# Patient Record
Sex: Male | Born: 1960 | Race: White | Hispanic: No | Marital: Single | Smoking: Current every day smoker
Health system: Southern US, Community
[De-identification: ages and names within clinical notes are randomized; demographics above are authoritative.]

## PROBLEM LIST (undated history)

## (undated) DIAGNOSIS — I2119 ST elevation (STEMI) myocardial infarction involving other coronary artery of inferior wall: Secondary | ICD-10-CM

## (undated) DIAGNOSIS — I69398 Other sequelae of cerebral infarction: Secondary | ICD-10-CM

## (undated) DIAGNOSIS — N400 Enlarged prostate without lower urinary tract symptoms: Secondary | ICD-10-CM

## (undated) DIAGNOSIS — E785 Hyperlipidemia, unspecified: Secondary | ICD-10-CM

## (undated) DIAGNOSIS — E1165 Type 2 diabetes mellitus with hyperglycemia: Secondary | ICD-10-CM

## (undated) DIAGNOSIS — C61 Malignant neoplasm of prostate: Secondary | ICD-10-CM

## (undated) DIAGNOSIS — N200 Calculus of kidney: Secondary | ICD-10-CM

## (undated) DIAGNOSIS — D649 Anemia, unspecified: Secondary | ICD-10-CM

## (undated) DIAGNOSIS — E118 Type 2 diabetes mellitus with unspecified complications: Secondary | ICD-10-CM

## (undated) DIAGNOSIS — N312 Flaccid neuropathic bladder, not elsewhere classified: Secondary | ICD-10-CM

## (undated) DIAGNOSIS — I2510 Atherosclerotic heart disease of native coronary artery without angina pectoris: Secondary | ICD-10-CM

## (undated) DIAGNOSIS — E669 Obesity, unspecified: Secondary | ICD-10-CM

## (undated) DIAGNOSIS — N319 Neuromuscular dysfunction of bladder, unspecified: Secondary | ICD-10-CM

## (undated) DIAGNOSIS — I10 Essential (primary) hypertension: Secondary | ICD-10-CM

## (undated) DIAGNOSIS — IMO0002 Reserved for concepts with insufficient information to code with codable children: Secondary | ICD-10-CM

## (undated) DIAGNOSIS — I69998 Other sequelae following unspecified cerebrovascular disease: Secondary | ICD-10-CM

## (undated) HISTORY — DX: Reserved for concepts with insufficient information to code with codable children: IMO0002

## (undated) HISTORY — DX: Flaccid neuropathic bladder, not elsewhere classified: N31.2

## (undated) HISTORY — DX: Hyperlipidemia, unspecified: E78.5

## (undated) HISTORY — PX: BACK SURGERY: SHX140

## (undated) HISTORY — DX: Atherosclerotic heart disease of native coronary artery without angina pectoris: I25.10

## (undated) HISTORY — DX: Benign prostatic hyperplasia without lower urinary tract symptoms: N40.0

## (undated) HISTORY — PX: CORONARY STENT PLACEMENT: SHX1402

## (undated) HISTORY — DX: ST elevation (STEMI) myocardial infarction involving other coronary artery of inferior wall: I21.19

## (undated) HISTORY — DX: Obesity, unspecified: E66.9

## (undated) HISTORY — DX: Type 2 diabetes mellitus with hyperglycemia: E11.65

## (undated) HISTORY — PX: OTHER SURGICAL HISTORY: SHX169

## (undated) HISTORY — DX: Type 2 diabetes mellitus with unspecified complications: E11.8

---

## 1997-06-10 ENCOUNTER — Ambulatory Visit (HOSPITAL_COMMUNITY): Admission: RE | Admit: 1997-06-10 | Discharge: 1997-06-10 | Payer: Self-pay | Admitting: Urology

## 1997-06-12 ENCOUNTER — Ambulatory Visit (HOSPITAL_COMMUNITY): Admission: RE | Admit: 1997-06-12 | Discharge: 1997-06-12 | Payer: Self-pay | Admitting: Urology

## 2002-08-01 ENCOUNTER — Emergency Department (HOSPITAL_COMMUNITY): Admission: EM | Admit: 2002-08-01 | Discharge: 2002-08-01 | Payer: Self-pay | Admitting: Emergency Medicine

## 2004-02-12 ENCOUNTER — Emergency Department (HOSPITAL_COMMUNITY): Admission: EM | Admit: 2004-02-12 | Discharge: 2004-02-12 | Payer: Self-pay | Admitting: Emergency Medicine

## 2009-10-12 ENCOUNTER — Inpatient Hospital Stay (HOSPITAL_COMMUNITY): Admission: AD | Admit: 2009-10-12 | Discharge: 2009-10-15 | Payer: Self-pay | Admitting: Cardiovascular Disease

## 2009-10-12 ENCOUNTER — Ambulatory Visit: Payer: Self-pay | Admitting: Cardiovascular Disease

## 2009-10-12 ENCOUNTER — Ambulatory Visit: Payer: Self-pay | Admitting: Internal Medicine

## 2009-10-14 ENCOUNTER — Encounter: Payer: Self-pay | Admitting: Cardiovascular Disease

## 2009-10-22 DIAGNOSIS — E785 Hyperlipidemia, unspecified: Secondary | ICD-10-CM

## 2009-10-22 DIAGNOSIS — E669 Obesity, unspecified: Secondary | ICD-10-CM

## 2009-10-22 DIAGNOSIS — I2119 ST elevation (STEMI) myocardial infarction involving other coronary artery of inferior wall: Secondary | ICD-10-CM

## 2009-10-22 DIAGNOSIS — F17200 Nicotine dependence, unspecified, uncomplicated: Secondary | ICD-10-CM | POA: Insufficient documentation

## 2009-10-22 HISTORY — DX: ST elevation (STEMI) myocardial infarction involving other coronary artery of inferior wall: I21.19

## 2009-10-22 HISTORY — DX: Obesity, unspecified: E66.9

## 2009-10-29 ENCOUNTER — Ambulatory Visit: Payer: Self-pay | Admitting: Cardiovascular Disease

## 2009-10-29 DIAGNOSIS — I2510 Atherosclerotic heart disease of native coronary artery without angina pectoris: Secondary | ICD-10-CM | POA: Insufficient documentation

## 2009-10-29 HISTORY — DX: Atherosclerotic heart disease of native coronary artery without angina pectoris: I25.10

## 2009-11-09 ENCOUNTER — Ambulatory Visit: Payer: Self-pay | Admitting: Cardiovascular Disease

## 2009-11-12 LAB — CONVERTED CEMR LAB
Albumin: 3.8 g/dL (ref 3.5–5.2)
Alkaline Phosphatase: 110 units/L (ref 39–117)
Cholesterol: 139 mg/dL (ref 0–200)
Direct LDL: 87.7 mg/dL
Total Protein: 6.8 g/dL (ref 6.0–8.3)

## 2009-12-11 ENCOUNTER — Ambulatory Visit: Payer: Self-pay | Admitting: Cardiovascular Disease

## 2009-12-18 ENCOUNTER — Telehealth: Payer: Self-pay | Admitting: Cardiovascular Disease

## 2009-12-21 LAB — CONVERTED CEMR LAB
ALT: 68 units/L — ABNORMAL HIGH (ref 0–53)
AST: 53 units/L — ABNORMAL HIGH (ref 0–37)
Alkaline Phosphatase: 137 units/L — ABNORMAL HIGH (ref 39–117)
Total Bilirubin: 0.4 mg/dL (ref 0.3–1.2)

## 2010-01-12 ENCOUNTER — Ambulatory Visit: Payer: Self-pay | Admitting: Cardiovascular Disease

## 2010-01-13 LAB — CONVERTED CEMR LAB
AST: 57 units/L — ABNORMAL HIGH (ref 0–37)
Albumin: 3.8 g/dL (ref 3.5–5.2)
Total Protein: 7.2 g/dL (ref 6.0–8.3)

## 2010-02-03 ENCOUNTER — Telehealth (INDEPENDENT_AMBULATORY_CARE_PROVIDER_SITE_OTHER): Payer: Self-pay | Admitting: *Deleted

## 2010-02-08 ENCOUNTER — Ambulatory Visit: Payer: Self-pay

## 2010-02-11 LAB — CONVERTED CEMR LAB
AST: 41 units/L — ABNORMAL HIGH (ref 0–37)
Albumin: 3.7 g/dL (ref 3.5–5.2)
Alkaline Phosphatase: 120 units/L — ABNORMAL HIGH (ref 39–117)
Bilirubin, Direct: 0.1 mg/dL (ref 0.0–0.3)
Total Protein: 7.3 g/dL (ref 6.0–8.3)

## 2010-03-01 ENCOUNTER — Telehealth (INDEPENDENT_AMBULATORY_CARE_PROVIDER_SITE_OTHER): Payer: Self-pay | Admitting: *Deleted

## 2010-03-14 ENCOUNTER — Encounter: Payer: Self-pay | Admitting: Emergency Medicine

## 2010-03-23 NOTE — Progress Notes (Signed)
Summary: question on meds  Phone Note Call from Patient Call back at Home Phone [phone] Call back at [phone]   Caller: Patient Reason for Call: Talk to Nurse Summary of Call: pt has question on rx EFFIENT 10 MG TABS.  # [phone] Initial call taken by: Roe Coombs,  December 18, 2009 10:34 AM  Follow-up for Phone Call        pt sister patricia states they fill out the form for the medication wrong. Now they need it to be fax again new rx prescription pt also needs a letter stating he does not have medicare. pt sister would like to talk to someone. # [phone]. Follow-up by: Roe Coombs,  December 18, 2009 10:56 AM  Additional Follow-up for Phone Call Additional follow up Details #1::        I talked with sister, Elease Hashimoto, about [facility] patient assistance form.  They accidentally checked the box yes --- as being eligible for medicare.  According to pt sister he is not eligible for medicare or medicaid and would like to speak with Dr. Clifton James.  I told her he is out of the office until Monday.  She will call back then and in the meantime will pick up another form from the office today.  I told her we could fax that form in for her. Mylo Red RN     Appended Document: question on meds Can we check on this and see what we can do to help? thanks, cdm  Appended Document: question on meds I faxed the info. to [employer]. Pt is aware.

## 2010-03-23 NOTE — Assessment & Plan Note (Signed)
Summary: eph/jml   Visit Type:  new pt visit Primary Provider:  NO PCP  CC:  no cardiac complaints today.  History of Present Illness: 50 yo WM with history of hyperlipidemia, CAD with recent admission to [facility] October 13, 2009 with acute inferior wall infarction with occluded RCA, now s/p bare metal stent RCA. His LVEF was 45% on LV gram. He has been doing well. No chest pain or SOB. He continues to smoke. No exercise.   Current Medications (verified): 1)  Aspirin Ec 325 Mg Tbec (Aspirin) .... Take One Tablet By Mouth Daily 2)  Lisinopril 2.5 Mg Tabs (Lisinopril) .[name].. 1 Tab Once Daily 3)  Metoprolol Tartrate 25 Mg Tabs (Metoprolol Tartrate) .... 1/2 Tab Two Times A Day 4)  Pravachol 40 Mg Tabs (Pravastatin Sodium) .[name].. 1 Tab Once Daily 5)  Effient 10 Mg Tabs (Prasugrel Hcl) .[name].. 1 Tab Once Daily  Allergies (verified): No Known Drug Allergies  Past History:  Past Medical History: CAD with inferior wall MI August 2011 with bare metal stent RCA.Diffuse disease in LM, LAD, RCA OBESITY  DYSLIPIDEMIA  TOBACCO ABUSE     Past Surgical History: Reviewed history from 10/22/2009 and no changes required. Back Surgery Testicular surgery  Family History: Denies any premature history of coronary artery   disease, sudden cardiac death to his knowledge.  Mother alive and healthy Father deceased, DM Uncle CAD/MI 1 sister alive and healthy  Social History: Single, no kids Tobacco Use - Yes, 1ppd for 30 years Regular Exercise - no Drug Use - no No alcohol Unemployed.  Review of Systems  The patient denies fatigue, malaise, fever, weight gain/loss, vision loss, decreased hearing, hoarseness, chest pain, palpitations, shortness of breath, prolonged cough, wheezing, sleep apnea, coughing up blood, abdominal pain, blood in stool, nausea, vomiting, diarrhea, heartburn, incontinence, blood in urine, muscle weakness, joint pain, leg swelling, rash, skin lesions, headache,  fainting, dizziness, depression, anxiety, enlarged lymph nodes, easy bruising or bleeding, and environmental allergies.    Vital Signs:  Patient profile:   50 year old male Height:      73 inches Weight:      276.8 pounds BMI:     36.65 Pulse rate:   63 / minute Pulse rhythm:   irregular BP sitting:   128 / 80  (left arm) Cuff size:   large  Vitals Entered By: Danielle Rankin, CMA (October 29, 2009 9:39 AM)  Physical Exam  General:  General: Well developed, well nourished, NAD HEENT: OP clear, mucus membranes moist SKIN: warm, dry Neuro: No focal deficits Musculoskeletal: Muscle strength 5/5 all ext Psychiatric: Mood and affect normal Neck: No JVD, no carotid bruits, no thyromegaly, no lymphadenopathy. Lungs:Clear bilaterally, no wheezes, rhonci, crackles CV: RRR no murmurs, gallops rubs Abdomen: soft, NT, ND, BS present Extremities: No edema, pulses 2+.    Cardiac Cath  Procedure date:  10/13/2009  Findings:      1. The left main coronary artery had a 40-50% stenosis.   2. The left anterior descending was a large-caliber vessel that       coursed to the apex and gave off several diagonal branches.  First       diagonal branch was a small vessel with ostial 60% stenosis.  The       second diagonal was a large vessel with plaque disease.  Proximal       LAD has 40% plaque.  The mid vessel had 40% plaque.  The distal  vessel had mild plaque.   3. The circumflex artery was a large-caliber vessel that gave off a       very small-caliber first obtuse marginal branch and a large-caliber       second obtuse marginal branch.  There were no flow-limiting lesions       in this vessel.   4. The right coronary artery was a large dominant system with diffuse       40% stenosis throughout the proximal and midportion of the vessel.       There was a 100% occlusion in the mid vessel just prior to the       distal segment.  After the vessel was opened, it was apparent that        the right coronary artery was very large with a large-caliber       posterior descending artery.   5. Left ventricular angiogram showed inferior wall hypokinesis with       ejection fraction of 45%.      IMPRESSION:   1. Acute inferior ST elevation myocardial infarction.2. Total occlusion of the mid right coronary artery.   3. Moderate disease in the left main artery, left anterior descending       artery and proximal right coronary artery.   4. Mild segmental left ventricular systolic dysfunction.   5. Successful percutaneous coronary intervention with placement of       bare-metal stent in the mid-to-distal right coronary artery.   EKG  Procedure date:  10/29/2009  Findings:      NSR, rate 63 bpm.   Echocardiogram  Procedure date:  10/14/2009  Findings:      Left ventricle: The cavity size was normal. Wall thickness was     normal. Systolic function was mildly reduced. The estimated     ejection fraction was in the range of 45% to 50%. The basal     inferior wall was severely hypokinetic to akinetic, the mid     inferior wall was hypokinetic. The posterior wall was probably     mildly hypokinetic. Features are consistent with a pseudonormal     left ventricular filling pattern, with concomitant abnormal     relaxation and increased filling pressure (grade 2 diastolic     dysfunction).   - Aortic valve: There was no stenosis.   - Aorta: Mild dilation of aortic root.   - Mitral valve: Trivial regurgitation.   - Right ventricle: The cavity size was normal. Systolic function was     normal.   - Tricuspid valve: Peak RV-RA gradient: 27mm Hg (S).   - Systemic veins: The IVC was not visualized.  Impression & Recommendations:  Problem # 1:  CAD, NATIVE VESSEL (ICD-414.01) s/p inferior wall STEMI Aguust 23, 2011 with bare metal stent RCA. He has diffuse disease in his left main, LAD and RCA. He continue to smoke, is overweight and not physically active. He has been taking his  medications. He has applied for the assistance program for Effient but has not heard back yet. He got one month of Effient for free. I have given him two months of samples of Effient today. He is getting his other medications. I have asked him to stop smoking, begin exercising and eating better.   His updated medication list for this problem includes:    Aspirin Ec 325 Mg Tbec (Aspirin) .[name]... Take one tablet by mouth daily    Lisinopril 2.5 Mg Tabs (Lisinopril) .[name]... 1 tab once daily  Metoprolol Tartrate 25 Mg Tabs (Metoprolol tartrate) .[name]... 1/2 tab two times a day    Effient 10 Mg Tabs (Prasugrel hcl) .[name]... 1 tab once daily  Problem # 2:  AMI, INFERIOR WALL (ICD-410.40) See above.   His updated medication list for this problem includes:    Aspirin Ec 325 Mg Tbec (Aspirin) .[name]... Take one tablet by mouth daily    Lisinopril 2.5 Mg Tabs (Lisinopril) .[name]... 1 tab once daily    Metoprolol Tartrate 25 Mg Tabs (Metoprolol tartrate) .[name]... 1/2 tab two times a day    Effient 10 Mg Tabs (Prasugrel hcl) .[name]... 1 tab once daily  Problem # 3:  DYSLIPIDEMIA (ICD-272.4) Continue Pravachol. Repeat LFTs, Fasting lipids in 10 weeks.   His updated medication list for this problem includes:    Pravachol 40 Mg Tabs (Pravastatin sodium) .[name]... 1 tab once daily  Problem # 4:  TOBACCO ABUSE (ICD-305.1) smoking cessation encouraged.   Other Orders: EKG w/ Interpretation (93000)  Patient Instructions: 1)  Your physician recommends that you schedule a follow-up appointment in: 6 months 2)  Your physician recommends that you return for a FASTING lipid profile and liver function test in 10 weeks. 272.0/414.01/wpa 3)  Your physician recommends that you continue on your current medications as directed. Please refer to the Current Medication list given to you today.

## 2010-03-25 NOTE — Progress Notes (Signed)
  DDS Request received sent to [facility]. Cala Bradford Mesiemore  March 01, 2010 2:35 PM

## 2010-03-25 NOTE — Progress Notes (Signed)
  DDS request received sent to [facility]  February 03, 2010 1:52 PM

## 2010-04-14 NOTE — H&P (Signed)
NAME:  Shane Bond, Shane Bond NO.:  [id number]  MEDICAL RECORD NO.:  [id number]          PATIENT TYPE:  INP  LOCATION:  2910                         FACILITY:  [facility]  PHYSICIAN:  Lidia Collum, MD      DATE OF BIRTH:  [birth year]  DATE OF ADMISSION:  10/12/2009 DATE OF DISCHARGE:                             HISTORY & PHYSICAL  CHIEF COMPLAINT:  Chest, bilateral arm pain and ST elevation MI.  HISTORY OF PRESENT ILLNESS:  A 50 year old gentleman with cardiac risk factors of extensive tobacco use, no other history of known coronary artery disease who developed bilateral arm and back pain at approximately 9:00 p.m.  He noted the symptoms first upon getting up and adjusting his pillow case, getting ready to go to bed.  The symptoms were associated with nausea, vomiting, diaphoresis.  These symptoms were unfamiliar to him and acute in onset.  He did not have any clear chest pain or chest discomfort but described it mostly as significant pain in his bilateral arms as well as in his back associated with some shortness of breath as well.  He called his sister who brought him to the emergency room at [facility] and at that time he was noted to have inferior ST elevations and was complaining of the above-mentioned symptoms.  He was treated with sublingual nitroglycerin and the STEMI protocol was activated.  He was emergently transferred to [facility] where he underwent urgent angiography.  His vital signs remained stable and upon presentation here, he felt that his symptoms had improved to 1/10.  He was transferred on heparin and treated with aspirin.  Angiography demonstrated total occlusion of the mid RCA requiring primary PCI with bare mental stent placement.  He was also noted to have other moderate lesions of the left system.  His vital signs remained stable throughout and he tolerated the procedure well, receiving Angiomax during the case.  Ventriculogram showed  his LV function was overall preserved.  He was transferred to the CCU and at present feels much better than he did prior without any complaints of arm or back pain at present or chest discomfort.  PAST MEDICAL HISTORY:  None.  Denies any hypertension, coronary artery disease, diabetes or dyslipidemia.  He has never had a cardiac workup including catheterization or stent prior to this episode.  PAST SURGICAL HISTORY:  Significant for back surgery as well as testicular surgery.  MEDICATIONS:  On arrival were none.  At home, he is currently on Angiomax as per protocol.  He received a full dose aspirin 325 p.o. daily and Effient 60 mg load in the cath lab.  He does not have any known drug allergies.  He currently lives with his wife.  He is unemployed at present.  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  He has significant tobacco history of approximately 25-30 years.  He notes occasional alcohol, notes regular diet and does not note any heavy activity at baseline.  He states that though he is ambulatory, he is generally more sedentary than he would like.  FAMILY HISTORY:  Denies any premature history of  coronary artery disease, sudden cardiac death to his knowledge.  REVIEW OF SYSTEMS:  Negative for fevers, chills, sweats, negative for headache, nasal discharge.  Positive for chest pain, shortness of breath, dyspnea, denies any PND.  Denies orthopnea.  Denies any change in lower extremity edema.  Denies any syncope, presyncope or any palpitations pain.  Denies any change in his bowel habits or change in urinary frequency.  He did have some nausea without any vomiting, no diarrhea.  No bright red blood per rectum or melena.  Does have history and complaints of heartburn.  Denies any polyuria or polydipsia or heat or cold intolerance.  PHYSICAL EXAMINATION:  VITAL SIGNS:  Currently blood pressure 169/70, afebrile, 99% on 2 liters, pulse of 65, 118 kg is his weight. GENERAL:  The  patient looks generally well in no acute distress, lying flat in the hospital bed in CCU room floor. HEENT:  Normocephalic, atraumatic.  Extraocular movements are intact. NECK:  Supple.  Jugular veins are flat. CARDIOVASCULAR:  Normal S1 and S2.  There is a soft 2/6 ejection murmur with no rubs noted.  PMI is not displaced.  Pulses are 2+ bilaterally. LUNGS:  Anteriorly are clear. SKIN:  Shows no rashes. ABDOMEN:  Soft, nontender but obese.  There is no rebounding, no guarding.  His right groin site of vascular access is soft, clear, no hematoma.  Angio-Seal was placed. RECTAL:  Deferred. EXTREMITIES:  Showed no edema at present. MUSCULOSKELETAL:  Within normal limits. NEUROLOGIC:  He is intact with no focal deficits noted.  SUBJECTIVE LAB WORK:  Underwent cardiac cath urgently here which showed left main 40% stenosis, LAD 40% in the mid area, diagonal ostial 60% but is a small vessel.  D2 large vessel with plaque distally.  Circumflex shows an OM1 which is very small, OM2 which is a large caliber.  RCA proximal and mid, there is a 40% diffuse lesion.  There was a 100% mid occlusion that was treated with a 3.0 x 12 mm Integrity BMS with post dilation.  LVEF noted to be 45% with inferior wall hypokinesis.  Lab work on presentation, H&H is 15/44, BUN and creatinine 8 and 0.7, potassium 4.7.  all other labs including cardiac enzymes are all pending.  Chest x-ray was also not done and is pending.  ECG on admission noted to have a heart rate sinus rhythm, normal intervals with significant ST elevations inferiorly with some reciprocal ST depressions.  ECG postprocedure shows normal sinus rhythm, heart rate of 73.  There is an isolated Q-wave in lead III,  PR interval is 176, ST elevations inferiorly to have resolved and otherwise now unremarkable ECG.  ASSESSMENT/PLAN:  This is a 50 year old gentleman with no known coronary disease, cardiac risk factor of significant tobacco use who  presents now with acute onset arm and chest discomfort consistent with an acute occlusion of the mid RCA, underwent primary PCA with successful bare metal stent placement and good post procedure TIMI flow with mildly reduced LV function overall, now admitted for further management. 1. Inferior wall myocardial infarction status post primary PCA bare-     metal stent to the right coronary artery.  Ejection fraction     overall preserved with inferior hypokinesis, but overall about 45%.     Current management will consist of full dose aspirin and Effient 10     mg p.o. daily, maintained dual antiplatelet therapy at present.  We     will continue the Angiomax drip for now until the  bag runs out.  We     will obtain an echocardiogram in the a.m. to better evaluate LV     function.  I would also check postprocedure cardiac enzymes for     troponin peak and also check fasting lipids in the a.m. We will     start low-dose beta blockade with Lopressor 12.5 mg p.o. b.i.d.     with hold parameters and start Crestor 40 mg p.o. at bedtime.  We     will hold on ACE inhibitor at present but will consider once echo     is resulted and we will also follow up baseline labs including     electrolytes, CBC and chemistry and cardiac enzymes for which I     will repeat ECG in a.m. and check a chest x-ray as well.     Regarding his moderate left main left anterior descending and     proximal right coronary artery disease, we will continue current     medical management at present and the patient will be followed     conservatively for now.  Further recommendations on that to follow.     Lidia Collum, MD    RM/MEDQ  D:  10/13/2009  T:  10/13/2009  Job:  [id number]  Electronically Signed by Arvilla Meres MD on 04/14/2010 01:51:07 PM

## 2010-05-06 LAB — POCT I-STAT, CHEM 8
BUN: 8 mg/dL (ref 6–23)
Calcium, Ion: 1.19 mmol/L (ref 1.12–1.32)
Chloride: 101 mEq/L (ref 96–112)
Creatinine, Ser: 1 mg/dL (ref 0.4–1.5)
HCT: 49 % (ref 39.0–52.0)
Hemoglobin: 16.7 g/dL (ref 13.0–17.0)
TCO2: 28 mmol/L (ref 0–100)

## 2010-05-06 LAB — COMPREHENSIVE METABOLIC PANEL
Albumin: 4 g/dL (ref 3.5–5.2)
BUN: 9 mg/dL (ref 6–23)
CO2: 29 mEq/L (ref 19–32)
Calcium: 9.6 mg/dL (ref 8.4–10.5)
Creatinine, Ser: 0.97 mg/dL (ref 0.4–1.5)
GFR calc non Af Amer: >60 mL/min (ref >60)
Glucose, Bld: 179 mg/dL — ABNORMAL HIGH (ref 70–99)
Potassium: 3.4 mEq/L — ABNORMAL LOW (ref 3.5–5.1)
Sodium: 140 mEq/L (ref 135–145)
Total Protein: 8 g/dL (ref 6.0–8.3)

## 2010-05-06 LAB — PROTIME-INR: INR: 1.06 (ref 0.00–1.49)

## 2010-05-06 LAB — CBC
HCT: 43.8 % (ref 39.0–52.0)
RDW: 12.1 % (ref 11.5–15.5)
WBC: 13.4 10*3/uL — ABNORMAL HIGH (ref 4.0–10.5)

## 2010-05-06 LAB — POCT CARDIAC MARKERS: CKMB, poc: <1 ng/mL — ABNORMAL LOW (ref 1.0–8.0)

## 2010-05-07 LAB — CBC
HCT: 41.4 % (ref 39.0–52.0)
Hemoglobin: 14.4 g/dL (ref 13.0–17.0)
MCH: 33.3 pg (ref 26.0–34.0)
MCH: 33.6 pg (ref 26.0–34.0)
MCV: 95.7 fL (ref 78.0–100.0)
Platelets: 188 10*3/uL (ref 150–400)
RBC: 4.17 MIL/uL — ABNORMAL LOW (ref 4.22–5.81)
RBC: 4.28 MIL/uL (ref 4.22–5.81)
RDW: 12.8 % (ref 11.5–15.5)
WBC: 8.9 10*3/uL (ref 4.0–10.5)

## 2010-05-07 LAB — COMPREHENSIVE METABOLIC PANEL
AST: 56 U/L — ABNORMAL HIGH (ref 0–37)
Alkaline Phosphatase: 107 U/L (ref 39–117)
CO2: 25 mEq/L (ref 19–32)
Chloride: 106 mEq/L (ref 96–112)
Creatinine, Ser: 0.78 mg/dL (ref 0.4–1.5)
GFR calc non Af Amer: >60 mL/min (ref >60)
Sodium: 137 mEq/L (ref 135–145)
Total Bilirubin: 0.8 mg/dL (ref 0.3–1.2)
Total Protein: 6.6 g/dL (ref 6.0–8.3)

## 2010-05-07 LAB — POCT I-STAT, CHEM 8
BUN: 8 mg/dL (ref 6–23)
Chloride: 103 mEq/L (ref 96–112)
Glucose, Bld: 216 mg/dL — ABNORMAL HIGH (ref 70–99)
HCT: 44 % (ref 39.0–52.0)
Hemoglobin: 15 g/dL (ref 13.0–17.0)
Potassium: 3.7 mEq/L (ref 3.5–5.1)
Sodium: 138 mEq/L (ref 135–145)

## 2010-05-07 LAB — MRSA PCR SCREENING: MRSA by PCR: NEGATIVE

## 2010-05-07 LAB — BASIC METABOLIC PANEL
BUN: 9 mg/dL (ref 6–23)
CO2: 26 mEq/L (ref 19–32)
Creatinine, Ser: 0.83 mg/dL (ref 0.4–1.5)
GFR calc Af Amer: >60 mL/min (ref >60)
GFR calc Af Amer: >60 mL/min (ref >60)
Glucose, Bld: 161 mg/dL — ABNORMAL HIGH (ref 70–99)
Potassium: 4.1 mEq/L (ref 3.5–5.1)
Sodium: 137 mEq/L (ref 135–145)

## 2010-05-07 LAB — LIPID PANEL
Cholesterol: 181 mg/dL (ref 0–200)
LDL Cholesterol: 93 mg/dL (ref 0–99)
Total CHOL/HDL Ratio: 8.2 RATIO
Triglycerides: 330 mg/dL — ABNORMAL HIGH (ref <150)
VLDL: 66 mg/dL — ABNORMAL HIGH (ref 0–40)

## 2010-05-07 LAB — CARDIAC PANEL(CRET KIN+CKTOT+MB+TROPI)
CK, MB: 17.8 ng/mL (ref 0.3–4.0)
CK, MB: 32.7 ng/mL (ref 0.3–4.0)
CK, MB: 72.4 ng/mL (ref 0.3–4.0)
Relative Index: 6.5 — ABNORMAL HIGH (ref 0.0–2.5)
Total CK: 273 U/L — ABNORMAL HIGH (ref 7–232)
Total CK: 381 U/L — ABNORMAL HIGH (ref 7–232)
Total CK: 504 U/L — ABNORMAL HIGH (ref 7–232)

## 2010-05-07 LAB — CK TOTAL AND CKMB (NOT AT ARMC)
Relative Index: 11.9 — ABNORMAL HIGH (ref 0.0–2.5)
Total CK: 336 U/L — ABNORMAL HIGH (ref 7–232)

## 2010-05-07 LAB — PROTIME-INR: INR: 1.06 (ref 0.00–1.49)

## 2010-05-07 LAB — APTT: aPTT: 30 seconds (ref 24–37)

## 2010-05-07 LAB — TROPONIN I: Troponin I: 3.43 ng/mL (ref 0.00–0.06)

## 2010-06-15 ENCOUNTER — Encounter: Payer: Self-pay | Admitting: Cardiovascular Disease

## 2010-06-16 ENCOUNTER — Ambulatory Visit: Payer: Self-pay | Admitting: Cardiovascular Disease

## 2010-06-16 ENCOUNTER — Telehealth: Payer: Self-pay | Admitting: Cardiovascular Disease

## 2010-06-16 DIAGNOSIS — I2510 Atherosclerotic heart disease of native coronary artery without angina pectoris: Secondary | ICD-10-CM

## 2010-06-16 NOTE — Telephone Encounter (Signed)
Pt needs effient. Pt also wants to know if he can get his meds sent to his house,

## 2010-06-16 NOTE — Telephone Encounter (Signed)
Attempted to call patient but his phone is currently not working.

## 2010-06-17 NOTE — Telephone Encounter (Signed)
I will send in a refill request to [facility].

## 2010-06-18 MED ORDER — PRASUGREL HCL 10 MG PO TABS: 10.0000 mg | ORAL_TABLET | Freq: Every day | ORAL | Status: DC

## 2010-06-18 NOTE — Telephone Encounter (Signed)
Pt's mother calling re message on effient-pls call her [phone]

## 2010-06-18 NOTE — Telephone Encounter (Signed)
Pt's mother aware we do not have any samples of Effient at this time.  Floreen Comber, RN contacted [facility] already about a refill yesterday.  Mother states pt only has 2 pills left.  A RX will be sent into [facility] on [address].  Instructed mother that pt doesn't have to fill the whole RX - he may get a few at a time until his samples come in.  She states understanding

## 2010-06-28 ENCOUNTER — Telehealth: Payer: Self-pay | Admitting: *Deleted

## 2010-06-28 NOTE — Telephone Encounter (Signed)
Pt mother made aware effient at the front desk for pick up Deliah Goody

## 2010-07-02 NOTE — Telephone Encounter (Signed)
Patient has already gotten his Effient.

## 2010-07-08 ENCOUNTER — Encounter: Payer: Self-pay | Admitting: Cardiovascular Disease

## 2010-07-08 ENCOUNTER — Ambulatory Visit (INDEPENDENT_AMBULATORY_CARE_PROVIDER_SITE_OTHER): Payer: Self-pay | Admitting: Cardiovascular Disease

## 2010-07-08 VITALS — BP 140/90 | HR 78 | Resp 18 | Ht 74.0 in | Wt 268.1 lb

## 2010-07-08 DIAGNOSIS — I10 Essential (primary) hypertension: Secondary | ICD-10-CM

## 2010-07-08 DIAGNOSIS — I2510 Atherosclerotic heart disease of native coronary artery without angina pectoris: Secondary | ICD-10-CM

## 2010-07-08 DIAGNOSIS — I152 Hypertension secondary to endocrine disorders: Secondary | ICD-10-CM | POA: Insufficient documentation

## 2010-07-08 MED ORDER — ASPIRIN 81 MG PO TBEC: 81.0000 mg | DELAYED_RELEASE_TABLET | Freq: Every day | ORAL | Status: DC

## 2010-07-08 MED ORDER — LISINOPRIL 2.5 MG PO TABS: 2.5000 mg | ORAL_TABLET | Freq: Every day | ORAL | Status: DC

## 2010-07-08 MED ORDER — METOPROLOL TARTRATE 25 MG PO TABS: 12.5000 mg | ORAL_TABLET | Freq: Two times a day (BID) | ORAL | Status: DC

## 2010-07-08 NOTE — Assessment & Plan Note (Signed)
Elevated. Will get him back onto his beta blocker and Ace-inhibitor.

## 2010-07-08 NOTE — Progress Notes (Signed)
History of Present Illness:50 yo WM with history of hyperlipidemia, CAD with admission to [facility] October 13, 2009 with acute inferior wall infarction with occluded RCA, now s/p bare metal stent RCA. There was moderate disease in the left main and the LAD.  His LVEF was 45% on LV gram. He is here today for routine cardiac follow up. He has been doing well. No chest pain or SOB. He continues to smoke. No exercise. He has not taken his metoprolol  Or Lisinopril for the last 4 months. He cannot afford the 6 dollars per month for these meds.   Past Medical History  Diagnosis Date  . CAD, NATIVE VESSEL 10/29/2009  . 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 OBESITY 10/22/2009    No past surgical history on file.  Current Outpatient Prescriptions  Medication Sig Dispense Refill  . aspirin 325 MG EC tablet Take 325 mg by mouth daily.        [name] lisinopril (PRINIVIL,ZESTRIL) 2.5 MG tablet Take 2.5 mg by mouth daily.        . metoprolol tartrate (LOPRESSOR) 25 MG tablet Take 12.5 mg by mouth 2 (two) times daily.       . prasugrel (EFFIENT) 10 MG TABS Take 1 tablet (10 mg total) by mouth daily.  30 tablet  6  . pravastatin (PRAVACHOL) 20 MG tablet Take 20 mg by mouth at bedtime.          No Known Allergies  History   Social History  . Marital Status: Married    Spouse Name: N/A    Number of Children: N/A  . Years of Education: N/A   Occupational History  . Not on file.   Social History Main Topics  . Smoking status: Current Everyday Smoker  . Smokeless tobacco: Not on file  . Alcohol Use:   . Drug Use:   . Sexually Active:    Other Topics Concern  . Not on file   Social History Narrative  . No narrative on file    Family History  Problem Relation Age of Onset  . Diabetes Father   . Coronary artery disease Other   . Heart attack Other     Review of Systems:  As stated in the HPI and otherwise negative.   BP 140/90  Pulse 78  Resp 18  Ht 6\' 2"  (1.88 m)  Wt 268 lb 1.9 oz  (121.618 kg)  BMI 34.42 kg/m2  Physical Examination: General: Well developed, well nourished, NAD HEENT: OP clear, mucus membranes moist SKIN: warm, dry. No rashes. Neuro: No focal deficits Musculoskeletal: Muscle strength 5/5 all ext Psychiatric: Mood and affect normal Neck: No JVD, no carotid bruits, no thyromegaly, no lymphadenopathy. Lungs:Clear bilaterally, no wheezes, rhonci, crackles Cardiovascular: Regular rate and rhythm. No murmurs, gallops or rubs. Abdomen:Soft. Bowel sounds present. Non-tender.  Extremities: No lower extremity edema. Pulses are 2 + in the bilateral DP/PT.  EKG:NSR, rate 79 bpm. Normal EKG.

## 2010-07-08 NOTE — Assessment & Plan Note (Addendum)
Stable. He has been taking ASA, Effient and Pravastatin but not taking his metoprolol or Lisinopril. I will refill his metoprolol and Lisinopril at [facility] and hopefully he can afford the medicine there. Reduce ASA to 81 mg po once daily.

## 2010-09-07 ENCOUNTER — Emergency Department (HOSPITAL_COMMUNITY)
Admission: EM | Admit: 2010-09-07 | Discharge: 2010-09-07 | Disposition: A | Discharge status: Home / Self Care | Payer: Self-pay | Attending: Emergency Medicine | Admitting: Emergency Medicine

## 2010-09-07 DIAGNOSIS — I10 Essential (primary) hypertension: Secondary | ICD-10-CM | POA: Insufficient documentation

## 2010-09-07 DIAGNOSIS — E119 Type 2 diabetes mellitus without complications: Secondary | ICD-10-CM | POA: Insufficient documentation

## 2010-09-07 DIAGNOSIS — B356 Tinea cruris: Secondary | ICD-10-CM | POA: Insufficient documentation

## 2010-09-07 DIAGNOSIS — F17200 Nicotine dependence, unspecified, uncomplicated: Secondary | ICD-10-CM | POA: Insufficient documentation

## 2010-09-07 DIAGNOSIS — E860 Dehydration: Secondary | ICD-10-CM | POA: Insufficient documentation

## 2010-09-07 LAB — CBC
Hemoglobin: 13.5 g/dL (ref 13.0–17.0)
MCH: 32.5 pg (ref 26.0–34.0)
MCV: 88 fL (ref 78.0–100.0)
Platelets: 177 10*3/uL (ref 150–400)
RBC: 4.15 MIL/uL — ABNORMAL LOW (ref 4.22–5.81)
WBC: 9.9 10*3/uL (ref 4.0–10.5)

## 2010-09-07 LAB — BASIC METABOLIC PANEL
Calcium: 9.5 mg/dL (ref 8.4–10.5)
Chloride: 87 mEq/L — ABNORMAL LOW (ref 96–112)
Creatinine, Ser: 0.75 mg/dL (ref 0.50–1.35)
GFR calc Af Amer: >60 mL/min (ref >60)

## 2010-09-15 ENCOUNTER — Emergency Department (HOSPITAL_COMMUNITY)
Admission: EM | Admit: 2010-09-15 | Discharge: 2010-09-15 | Disposition: A | Discharge status: Home / Self Care | Payer: Self-pay | Attending: Emergency Medicine | Admitting: Emergency Medicine

## 2010-09-15 ENCOUNTER — Encounter: Payer: Self-pay | Admitting: Internal Medicine

## 2010-09-15 DIAGNOSIS — I10 Essential (primary) hypertension: Secondary | ICD-10-CM | POA: Insufficient documentation

## 2010-09-15 DIAGNOSIS — E119 Type 2 diabetes mellitus without complications: Secondary | ICD-10-CM | POA: Insufficient documentation

## 2010-09-15 DIAGNOSIS — I252 Old myocardial infarction: Secondary | ICD-10-CM | POA: Insufficient documentation

## 2010-09-15 DIAGNOSIS — F17200 Nicotine dependence, unspecified, uncomplicated: Secondary | ICD-10-CM | POA: Insufficient documentation

## 2010-09-15 LAB — GLUCOSE, CAPILLARY: Glucose-Capillary: 215 mg/dL — ABNORMAL HIGH (ref 70–99)

## 2010-10-27 ENCOUNTER — Telehealth: Payer: Self-pay | Admitting: Cardiovascular Disease

## 2010-10-27 ENCOUNTER — Emergency Department (HOSPITAL_COMMUNITY)
Admission: EM | Admit: 2010-10-27 | Discharge: 2010-10-27 | Disposition: A | Discharge status: Home / Self Care | Payer: Self-pay | Attending: Emergency Medicine | Admitting: Emergency Medicine

## 2010-10-27 DIAGNOSIS — I10 Essential (primary) hypertension: Secondary | ICD-10-CM | POA: Insufficient documentation

## 2010-10-27 DIAGNOSIS — Z760 Encounter for issue of repeat prescription: Secondary | ICD-10-CM | POA: Insufficient documentation

## 2010-10-27 DIAGNOSIS — F17200 Nicotine dependence, unspecified, uncomplicated: Secondary | ICD-10-CM | POA: Insufficient documentation

## 2010-10-27 DIAGNOSIS — I252 Old myocardial infarction: Secondary | ICD-10-CM | POA: Insufficient documentation

## 2010-10-27 DIAGNOSIS — E119 Type 2 diabetes mellitus without complications: Secondary | ICD-10-CM | POA: Insufficient documentation

## 2010-10-27 NOTE — Telephone Encounter (Signed)
I talked with Shane Bond at [facility] [phone]. She will fax refill authorization request to Dr Clifton James to complete and send back.

## 2010-10-27 NOTE — Telephone Encounter (Signed)
Pt on pt assitance program and has one bottle left of effient, told to call to reorder, if can't be reached can call  [phone] sister Mare Ferrari

## 2010-10-30 ENCOUNTER — Emergency Department (HOSPITAL_COMMUNITY)
Admission: EM | Admit: 2010-10-30 | Discharge: 2010-10-30 | Disposition: A | Discharge status: Home / Self Care | Payer: Self-pay | Attending: Emergency Medicine | Admitting: Emergency Medicine

## 2010-10-30 ENCOUNTER — Emergency Department (HOSPITAL_COMMUNITY): Payer: Self-pay

## 2010-10-30 DIAGNOSIS — R319 Hematuria, unspecified: Secondary | ICD-10-CM | POA: Insufficient documentation

## 2010-10-30 DIAGNOSIS — I10 Essential (primary) hypertension: Secondary | ICD-10-CM | POA: Insufficient documentation

## 2010-10-30 DIAGNOSIS — F17200 Nicotine dependence, unspecified, uncomplicated: Secondary | ICD-10-CM | POA: Insufficient documentation

## 2010-10-30 DIAGNOSIS — E119 Type 2 diabetes mellitus without complications: Secondary | ICD-10-CM | POA: Insufficient documentation

## 2010-10-30 DIAGNOSIS — I2510 Atherosclerotic heart disease of native coronary artery without angina pectoris: Secondary | ICD-10-CM | POA: Insufficient documentation

## 2010-10-30 DIAGNOSIS — R109 Unspecified abdominal pain: Secondary | ICD-10-CM | POA: Insufficient documentation

## 2010-10-30 DIAGNOSIS — I252 Old myocardial infarction: Secondary | ICD-10-CM | POA: Insufficient documentation

## 2010-10-30 DIAGNOSIS — Z87828 Personal history of other (healed) physical injury and trauma: Secondary | ICD-10-CM | POA: Insufficient documentation

## 2010-10-30 LAB — DIFFERENTIAL
Basophils Absolute: 0.1 10*3/uL (ref 0.0–0.1)
Basophils Relative: 1 % (ref 0–1)
Eosinophils Absolute: 0.3 10*3/uL (ref 0.0–0.7)
Neutrophils Relative %: 58 % (ref 43–77)

## 2010-10-30 LAB — CBC
MCH: 33.8 pg (ref 26.0–34.0)
Platelets: 266 10*3/uL (ref 150–400)
RBC: 4.44 MIL/uL (ref 4.22–5.81)
WBC: 13.3 10*3/uL — ABNORMAL HIGH (ref 4.0–10.5)

## 2010-10-30 LAB — URINALYSIS, ROUTINE W REFLEX MICROSCOPIC
Nitrite: POSITIVE — AB
Specific Gravity, Urine: 1.02 (ref 1.005–1.030)
pH: 7 (ref 5.0–8.0)

## 2010-10-30 LAB — POCT I-STAT, CHEM 8
Calcium, Ion: 1.2 mmol/L (ref 1.12–1.32)
HCT: 47 % (ref 39.0–52.0)
Hemoglobin: 16 g/dL (ref 13.0–17.0)
TCO2: 23 mmol/L (ref 0–100)

## 2010-10-30 LAB — URINE MICROSCOPIC-ADD ON

## 2010-11-03 LAB — URINE CULTURE: Culture  Setup Time: 201209090415

## 2010-11-16 ENCOUNTER — Telehealth: Payer: Self-pay | Admitting: *Deleted

## 2010-11-16 NOTE — Telephone Encounter (Signed)
PT AWARE EFFIENT RECEIVED FROM [facility] ASSISTANCE [facility]  LEFT AT FRONT DESK FOR PT PICK UP .Zack Seal

## 2010-11-24 ENCOUNTER — Emergency Department (HOSPITAL_COMMUNITY)
Admission: EM | Admit: 2010-11-24 | Discharge: 2010-11-24 | Disposition: A | Discharge status: Home / Self Care | Payer: Self-pay | Attending: Emergency Medicine | Admitting: Emergency Medicine

## 2010-11-24 DIAGNOSIS — K047 Periapical abscess without sinus: Secondary | ICD-10-CM | POA: Insufficient documentation

## 2010-11-24 DIAGNOSIS — I10 Essential (primary) hypertension: Secondary | ICD-10-CM | POA: Insufficient documentation

## 2010-11-24 DIAGNOSIS — I252 Old myocardial infarction: Secondary | ICD-10-CM | POA: Insufficient documentation

## 2010-11-24 DIAGNOSIS — E119 Type 2 diabetes mellitus without complications: Secondary | ICD-10-CM | POA: Insufficient documentation

## 2010-11-24 DIAGNOSIS — F17200 Nicotine dependence, unspecified, uncomplicated: Secondary | ICD-10-CM | POA: Insufficient documentation

## 2010-11-24 LAB — COMPREHENSIVE METABOLIC PANEL
ALT: 41 U/L (ref 0–53)
AST: 29 U/L (ref 0–37)
Albumin: 3.8 g/dL (ref 3.5–5.2)
Alkaline Phosphatase: 120 U/L — ABNORMAL HIGH (ref 39–117)
BUN: 14 mg/dL (ref 6–23)
CO2: 23 mEq/L (ref 19–32)
Calcium: 10.5 mg/dL (ref 8.4–10.5)
Chloride: 103 mEq/L (ref 96–112)
Creatinine, Ser: 0.78 mg/dL (ref 0.50–1.35)
GFR calc Af Amer: >90 mL/min (ref >90)
GFR calc non Af Amer: >90 mL/min (ref >90)
Glucose, Bld: 98 mg/dL (ref 70–99)
Potassium: 3.9 mEq/L (ref 3.5–5.1)
Sodium: 137 mEq/L (ref 135–145)
Total Bilirubin: 0.2 mg/dL — ABNORMAL LOW (ref 0.3–1.2)
Total Protein: 8.1 g/dL (ref 6.0–8.3)

## 2010-11-24 LAB — CBC
HCT: 42.7 % (ref 39.0–52.0)
Hemoglobin: 15 g/dL (ref 13.0–17.0)
MCH: 33.5 pg (ref 26.0–34.0)
MCHC: 35.1 g/dL (ref 30.0–36.0)
MCV: 95.3 fL (ref 78.0–100.0)
Platelets: 236 10*3/uL (ref 150–400)
RBC: 4.48 MIL/uL (ref 4.22–5.81)
RDW: 12.4 % (ref 11.5–15.5)
WBC: 10.8 10*3/uL — ABNORMAL HIGH (ref 4.0–10.5)

## 2010-11-24 LAB — DIFFERENTIAL
Basophils Absolute: 0.1 10*3/uL (ref 0.0–0.1)
Basophils Relative: 1 % (ref 0–1)
Eosinophils Absolute: 0.3 10*3/uL (ref 0.0–0.7)
Eosinophils Relative: 3 % (ref 0–5)
Lymphocytes Relative: 37 % (ref 12–46)
Lymphs Abs: 4 10*3/uL (ref 0.7–4.0)
Monocytes Absolute: 1.4 10*3/uL — ABNORMAL HIGH (ref 0.1–1.0)
Monocytes Relative: 13 % — ABNORMAL HIGH (ref 3–12)
Neutro Abs: 5.1 10*3/uL (ref 1.7–7.7)
Neutrophils Relative %: 47 % (ref 43–77)

## 2010-11-24 LAB — GLUCOSE, CAPILLARY: Glucose-Capillary: 100 mg/dL — ABNORMAL HIGH (ref 70–99)

## 2010-11-26 ENCOUNTER — Encounter: Payer: Self-pay | Admitting: Cardiovascular Disease

## 2010-12-03 ENCOUNTER — Telehealth: Payer: Self-pay | Admitting: Cardiovascular Disease

## 2010-12-03 NOTE — Telephone Encounter (Signed)
Pt found out he had diabetes, and wants to know if dr Clifton James has a dr he would recommend

## 2010-12-03 NOTE — Telephone Encounter (Signed)
Spoke with pt. He states he was recently diagnosed with diabetes and would like Dr. Gibson Ramp recommendation for MD to manage his diabetes. He has no primary MD. I told pt I would send note to Dr.McAlhany.

## 2010-12-08 NOTE — Telephone Encounter (Signed)
Can we check and see if Dr. Everardo All is taking new patients. If not, can we refer him to [facility] (Dr. Waynard Edwards, Dr. Clelia Croft or Dr. Timothy Lasso). Thanks, chris

## 2010-12-09 NOTE — Telephone Encounter (Signed)
Spoke with pt and he states he has been referred to assistance program through [facility] and they are to send him paperwork but he has not received it yet.  I gave him number for patient accounting department and he will contact them to check on status of paperwork.  He will call me back with information.

## 2010-12-09 NOTE — Telephone Encounter (Signed)
Spoke with Dr. George Hugh office and he is not taking new patients unless referred by primary care. Left message at [facility] with new pt coordinator to call back regarding referral.

## 2010-12-09 NOTE — Telephone Encounter (Signed)
Spoke with coordinator at [facility] and earliest availability at this time for new pt's is March 2013 unless reviewed with MD. Will review with Dr. Clifton James.

## 2010-12-13 NOTE — Telephone Encounter (Signed)
When I spoke with pt he did not want to make appt with [facility] at this time due to inability to pay out of pocket cost

## 2011-03-16 ENCOUNTER — Telehealth: Payer: Self-pay | Admitting: Cardiovascular Disease

## 2011-03-16 NOTE — Telephone Encounter (Signed)
Pt needs effient reorder, pls call [phone] when comes in

## 2011-03-16 NOTE — Telephone Encounter (Signed)
03/16/11--2pm--pt calling stating he wants [place] to refill effient--iam assuming this is a pt assistant refill and advised pt i would pass message along to dr Clifton James and his nurse--pt agrees--nt

## 2011-03-17 NOTE — Telephone Encounter (Signed)
His Effient can be stopped. He is now 18 months out from his STEMI and bare metal stent. Thanks, chris

## 2011-03-17 NOTE — Telephone Encounter (Signed)
Spoke with [facility] and pt will need to reapply for Effient assistance.  His enrollment expired after last shipment.  Will review with Dr. Clifton James to see how long pt needs to continue Effient.

## 2011-03-17 NOTE — Telephone Encounter (Signed)
Spoke with pt and told him he could stop Effient

## 2012-02-26 ENCOUNTER — Emergency Department (HOSPITAL_COMMUNITY): Payer: Self-pay

## 2012-02-26 ENCOUNTER — Inpatient Hospital Stay (HOSPITAL_COMMUNITY)
Admission: EM | Admit: 2012-02-26 | Discharge: 2012-02-29 | DRG: 693 | Disposition: A | Discharge status: Home / Self Care | Payer: MEDICAID | Attending: Urology | Admitting: Urology

## 2012-02-26 ENCOUNTER — Encounter (HOSPITAL_COMMUNITY): Payer: Self-pay | Admitting: Anesthesiology

## 2012-02-26 ENCOUNTER — Encounter (HOSPITAL_COMMUNITY)
Admission: EM | Disposition: A | Discharge status: Home / Self Care | Payer: Self-pay | Source: Home / Self Care | Attending: Urology

## 2012-02-26 ENCOUNTER — Emergency Department (HOSPITAL_COMMUNITY): Payer: Self-pay | Admitting: Anesthesiology

## 2012-02-26 ENCOUNTER — Encounter (HOSPITAL_COMMUNITY): Payer: Self-pay | Admitting: *Deleted

## 2012-02-26 DIAGNOSIS — N390 Urinary tract infection, site not specified: Secondary | ICD-10-CM | POA: Diagnosis present

## 2012-02-26 DIAGNOSIS — N200 Calculus of kidney: Secondary | ICD-10-CM

## 2012-02-26 DIAGNOSIS — I2510 Atherosclerotic heart disease of native coronary artery without angina pectoris: Secondary | ICD-10-CM | POA: Diagnosis present

## 2012-02-26 DIAGNOSIS — E669 Obesity, unspecified: Secondary | ICD-10-CM | POA: Diagnosis present

## 2012-02-26 DIAGNOSIS — N201 Calculus of ureter: Principal | ICD-10-CM | POA: Diagnosis present

## 2012-02-26 DIAGNOSIS — I252 Old myocardial infarction: Secondary | ICD-10-CM

## 2012-02-26 DIAGNOSIS — Z6836 Body mass index (BMI) 36.0-36.9, adult: Secondary | ICD-10-CM

## 2012-02-26 DIAGNOSIS — Q638 Other specified congenital malformations of kidney: Secondary | ICD-10-CM

## 2012-02-26 DIAGNOSIS — A419 Sepsis, unspecified organism: Secondary | ICD-10-CM

## 2012-02-26 HISTORY — PX: CYSTOSCOPY W/ URETERAL STENT PLACEMENT: SHX1429

## 2012-02-26 LAB — CBC WITH DIFFERENTIAL/PLATELET
Basophils Absolute: 0 10*3/uL (ref 0.0–0.1)
Eosinophils Relative: 1 % (ref 0–5)
HCT: 44.2 % (ref 39.0–52.0)
Lymphocytes Relative: 21 % (ref 12–46)
Lymphs Abs: 0.6 10*3/uL — ABNORMAL LOW (ref 0.7–4.0)
MCV: 95.9 fL (ref 78.0–100.0)
Neutro Abs: 2.3 10*3/uL (ref 1.7–7.7)
Platelets: 154 10*3/uL (ref 150–400)
RBC: 4.61 MIL/uL (ref 4.22–5.81)
WBC: 2.9 10*3/uL — ABNORMAL LOW (ref 4.0–10.5)

## 2012-02-26 LAB — URINALYSIS, ROUTINE W REFLEX MICROSCOPIC
Glucose, UA: NEGATIVE mg/dL
Specific Gravity, Urine: 1.016 (ref 1.005–1.030)
Urobilinogen, UA: 0.2 mg/dL (ref 0.0–1.0)

## 2012-02-26 LAB — COMPREHENSIVE METABOLIC PANEL
ALT: 20 U/L (ref 0–53)
AST: 17 U/L (ref 0–37)
Alkaline Phosphatase: 138 U/L — ABNORMAL HIGH (ref 39–117)
CO2: 23 mEq/L (ref 19–32)
Calcium: 9.7 mg/dL (ref 8.4–10.5)
Chloride: 105 mEq/L (ref 96–112)
GFR calc Af Amer: >90 mL/min (ref >90)
GFR calc non Af Amer: 82 mL/min — ABNORMAL LOW (ref >90)
Glucose, Bld: 130 mg/dL — ABNORMAL HIGH (ref 70–99)
Sodium: 140 mEq/L (ref 135–145)
Total Bilirubin: 0.4 mg/dL (ref 0.3–1.2)

## 2012-02-26 LAB — GLUCOSE, CAPILLARY: Glucose-Capillary: 145 mg/dL — ABNORMAL HIGH (ref 70–99)

## 2012-02-26 LAB — LACTIC ACID, PLASMA: Lactic Acid, Venous: 4.5 mmol/L — ABNORMAL HIGH (ref 0.5–2.2)

## 2012-02-26 SURGERY — CYSTOSCOPY, WITH RETROGRADE PYELOGRAM AND URETERAL STENT INSERTION
Anesthesia: General | Site: Ureter | Laterality: Left | Wound class: Clean Contaminated

## 2012-02-26 MED ORDER — HYDROCODONE-ACETAMINOPHEN 5-325 MG PO TABS: 1.0000 | ORAL_TABLET | ORAL | Status: DC | PRN

## 2012-02-26 MED ORDER — KETOROLAC TROMETHAMINE 15 MG/ML IJ SOLN
INTRAMUSCULAR | Status: AC
  Administered 2012-02-26: 15 mg
  Filled 2012-02-26: qty 1

## 2012-02-26 MED ORDER — FENTANYL CITRATE 0.05 MG/ML IJ SOLN: 25.0000 ug | INTRAMUSCULAR | Status: DC | PRN

## 2012-02-26 MED ORDER — IOHEXOL 300 MG/ML  SOLN
INTRAMUSCULAR | Status: AC
  Filled 2012-02-26: qty 1

## 2012-02-26 MED ORDER — LIDOCAINE HCL (CARDIAC) 20 MG/ML IV SOLN
INTRAVENOUS | Status: DC | PRN
  Administered 2012-02-26: 100 mg via INTRAVENOUS

## 2012-02-26 MED ORDER — PROPOFOL 10 MG/ML IV BOLUS
INTRAVENOUS | Status: DC | PRN
  Administered 2012-02-26: 200 mg via INTRAVENOUS

## 2012-02-26 MED ORDER — DEXTROSE 5 % IV SOLN
1.0000 g | INTRAVENOUS | Status: DC
  Administered 2012-02-27 – 2012-02-28 (×2): 1 g via INTRAVENOUS
  Filled 2012-02-26 (×3): qty 10

## 2012-02-26 MED ORDER — LIDOCAINE HCL 2 % EX GEL
CUTANEOUS | Status: DC | PRN
  Administered 2012-02-26: 1

## 2012-02-26 MED ORDER — KETOROLAC TROMETHAMINE 15 MG/ML IJ SOLN
15.0000 mg | Freq: Once | INTRAMUSCULAR | Status: DC
  Filled 2012-02-26: qty 1

## 2012-02-26 MED ORDER — LIDOCAINE HCL 2 % EX GEL
CUTANEOUS | Status: AC
  Filled 2012-02-26: qty 10

## 2012-02-26 MED ORDER — SODIUM CHLORIDE 0.9 % IR SOLN
Status: DC | PRN
  Administered 2012-02-26: 1

## 2012-02-26 MED ORDER — SODIUM CHLORIDE 0.9 % IV BOLUS (SEPSIS)
1000.0000 mL | Freq: Once | INTRAVENOUS | Status: AC
  Administered 2012-02-26: 1000 mL via INTRAVENOUS

## 2012-02-26 MED ORDER — KCL IN DEXTROSE-NACL 30-5-0.45 MEQ/L-%-% IV SOLN
INTRAVENOUS | Status: DC
  Administered 2012-02-26 – 2012-02-29 (×7): via INTRAVENOUS
  Filled 2012-02-26 (×14): qty 1000

## 2012-02-26 MED ORDER — IOHEXOL 300 MG/ML  SOLN
INTRAMUSCULAR | Status: DC | PRN
  Administered 2012-02-26: 10 mL via INTRAVENOUS

## 2012-02-26 MED ORDER — HYDROMORPHONE HCL PF 1 MG/ML IJ SOLN
0.5000 mg | INTRAMUSCULAR | Status: DC | PRN
  Administered 2012-02-27: 1 mg via INTRAVENOUS
  Filled 2012-02-26: qty 1

## 2012-02-26 MED ORDER — ONDANSETRON HCL 4 MG/2ML IJ SOLN
4.0000 mg | Freq: Once | INTRAMUSCULAR | Status: AC
  Administered 2012-02-26: 4 mg via INTRAVENOUS
  Filled 2012-02-26: qty 2

## 2012-02-26 MED ORDER — ONDANSETRON HCL 4 MG/2ML IJ SOLN: 4.0000 mg | INTRAMUSCULAR | Status: DC | PRN

## 2012-02-26 MED ORDER — DEXTROSE 5 % IV SOLN
1.0000 g | Freq: Once | INTRAVENOUS | Status: AC
  Administered 2012-02-26: 1 g via INTRAVENOUS
  Filled 2012-02-26: qty 10

## 2012-02-26 MED ORDER — FENTANYL CITRATE 0.05 MG/ML IJ SOLN
INTRAMUSCULAR | Status: DC | PRN
  Administered 2012-02-26: 50 ug via INTRAVENOUS

## 2012-02-26 MED ORDER — SODIUM CHLORIDE 0.9 % IV SOLN: Freq: Once | INTRAVENOUS | Status: DC

## 2012-02-26 MED ORDER — LACTATED RINGERS IV SOLN
INTRAVENOUS | Status: DC
  Administered 2012-02-26: 12:00:00 via INTRAVENOUS

## 2012-02-26 MED ORDER — ENOXAPARIN SODIUM 40 MG/0.4ML ~~LOC~~ SOLN
40.0000 mg | SUBCUTANEOUS | Status: DC
  Filled 2012-02-26 (×2): qty 0.4

## 2012-02-26 MED ORDER — ENOXAPARIN SODIUM 40 MG/0.4ML ~~LOC~~ SOLN
40.0000 mg | SUBCUTANEOUS | Status: DC
  Administered 2012-02-26 – 2012-02-28 (×3): 40 mg via SUBCUTANEOUS
  Filled 2012-02-26 (×4): qty 0.4

## 2012-02-26 MED ORDER — SODIUM CHLORIDE 0.9 % IV BOLUS (SEPSIS)
500.0000 mL | Freq: Once | INTRAVENOUS | Status: AC
  Administered 2012-02-26: 500 mL via INTRAVENOUS

## 2012-02-26 MED ORDER — ACETAMINOPHEN 10 MG/ML IV SOLN
INTRAVENOUS | Status: DC | PRN
  Administered 2012-02-26: 1000 mg via INTRAVENOUS

## 2012-02-26 MED ORDER — LACTATED RINGERS IV SOLN
INTRAVENOUS | Status: DC | PRN
  Administered 2012-02-26: 11:00:00 via INTRAVENOUS

## 2012-02-26 MED ORDER — ACETAMINOPHEN 10 MG/ML IV SOLN
INTRAVENOUS | Status: AC
  Filled 2012-02-26: qty 100

## 2012-02-26 MED ORDER — PHENYLEPHRINE HCL 10 MG/ML IJ SOLN
INTRAMUSCULAR | Status: DC | PRN
  Administered 2012-02-26 (×3): 80 ug via INTRAVENOUS

## 2012-02-26 MED ORDER — MORPHINE SULFATE 4 MG/ML IJ SOLN
6.0000 mg | Freq: Once | INTRAMUSCULAR | Status: AC
  Administered 2012-02-26: 6 mg via INTRAVENOUS
  Filled 2012-02-26: qty 2

## 2012-02-26 MED ORDER — OXYBUTYNIN CHLORIDE 5 MG PO TABS
5.0000 mg | ORAL_TABLET | Freq: Three times a day (TID) | ORAL | Status: DC | PRN
  Filled 2012-02-26: qty 1

## 2012-02-26 MED ORDER — HYDROMORPHONE HCL PF 2 MG/ML IJ SOLN
2.0000 mg | Freq: Once | INTRAMUSCULAR | Status: AC
  Administered 2012-02-26: 2 mg via INTRAVENOUS
  Filled 2012-02-26: qty 1

## 2012-02-26 MED ORDER — PNEUMOCOCCAL VAC POLYVALENT 25 MCG/0.5ML IJ INJ
0.5000 mL | INJECTION | INTRAMUSCULAR | Status: AC
  Administered 2012-02-27: 0.5 mL via INTRAMUSCULAR
  Filled 2012-02-26 (×2): qty 0.5

## 2012-02-26 SURGICAL SUPPLY — 22 items
ADAPTER CATH URET PLST 4-6FR (CATHETERS) ×2 IMPLANT
ADPR CATH URET STRL DISP 4-6FR (CATHETERS) ×1
BAG URINE DRAINAGE (UROLOGICAL SUPPLIES) ×1 IMPLANT
BAG URO CATCHER STRL LF (DRAPE) ×2 IMPLANT
CATH FOLEY 2WAY SLVR  5CC 16FR (CATHETERS) ×1
CATH FOLEY 2WAY SLVR 5CC 16FR (CATHETERS) IMPLANT
CATH INTERMIT  6FR 70CM (CATHETERS) ×1 IMPLANT
CATH URET 5FR 28IN CONE TIP (BALLOONS)
CATH URET 5FR 70CM CONE TIP (BALLOONS) IMPLANT
CATH URET WHISTLE 5FR 28IN (CATHETERS) IMPLANT
CATH URET WHISTLE 6FR (CATHETERS) IMPLANT
CLOTH BEACON ORANGE TIMEOUT ST (SAFETY) ×2 IMPLANT
GLOVE BIOGEL M STRL SZ7.5 (GLOVE) ×2 IMPLANT
GOWN PREVENTION PLUS XLARGE (GOWN DISPOSABLE) ×2 IMPLANT
GOWN STRL NON-REIN LRG LVL3 (GOWN DISPOSABLE) ×2 IMPLANT
GOWN STRL REIN XL XLG (GOWN DISPOSABLE) ×2 IMPLANT
MANIFOLD NEPTUNE II (INSTRUMENTS) ×2 IMPLANT
PACK CYSTO (CUSTOM PROCEDURE TRAY) ×2 IMPLANT
STENT CONTOUR 6FRX24X.038 (STENTS) ×1 IMPLANT
SYRINGE 10CC LL (SYRINGE) ×1 IMPLANT
TUBING CONNECTING 10 (TUBING) ×2 IMPLANT
WIRE COONS/BENSON .038X145CM (WIRE) ×2 IMPLANT

## 2012-02-26 NOTE — Transfer of Care (Signed)
Immediate Anesthesia Transfer of Care Note  Patient: Shane Bond  Procedure(s) Performed: Procedure(s) (LRB) with comments: CYSTOSCOPY WITH RETROGRADE PYELOGRAM/URETERAL STENT PLACEMENT (Left)  Patient Location: PACU  Anesthesia Type:General  Level of Consciousness: sedated  Airway & Oxygen Therapy: Patient Spontanous Breathing and Patient connected to face mask oxygen  Post-op Assessment: Report given to PACU RN and Post -op Vital signs reviewed and stable  Post vital signs: Reviewed and stable  Complications: No apparent anesthesia complications

## 2012-02-26 NOTE — ED Notes (Signed)
OR called and ready for pt

## 2012-02-26 NOTE — Progress Notes (Signed)
Sbp continues to be 88, pt asymptomatic, awake talking to visitors, eating.  Good urine ouput, denies pain

## 2012-02-26 NOTE — Progress Notes (Signed)
BP 88/52, asymptomic, Dr Isabel Caprice aware, see new order.

## 2012-02-26 NOTE — Anesthesia Preprocedure Evaluation (Addendum)
Anesthesia Evaluation  Patient identified by MRN, date of birth, ID band Patient awake    Reviewed: Allergy & Precautions, H&P , NPO status , Patient's Chart, lab work & pertinent test results, reviewed documented beta blocker date and time   Airway Mallampati: II TM Distance: >3 FB Neck ROM: full    Dental  (+) Poor Dentition and Dental Advisory Given   Pulmonary Current Smoker,  breath sounds clear to auscultation  Pulmonary exam normal       Cardiovascular hypertension, Pt. 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 negative neurological ROS  negative psych ROS   GI/Hepatic negative GI ROS, Neg liver ROS,   Endo/Other  negative endocrine ROS  Renal/GU negative Renal ROS  negative genitourinary   Musculoskeletal   Abdominal   Peds  Hematology negative hematology ROS (+)   Anesthesia Other Findings   Reproductive/Obstetrics negative OB ROS                         Anesthesia Physical Anesthesia Plan  ASA: III  Anesthesia Plan: General   Post-op Pain Management:    Induction: Intravenous  Airway Management Planned: LMA  Additional Equipment:   Intra-op Plan:   Post-operative Plan:   Informed Consent: I have reviewed the patients History and Physical, chart, labs and discussed the procedure including the risks, benefits and alternatives for the proposed anesthesia with the patient or authorized representative who has indicated his/her understanding and acceptance.   Dental Advisory Given  Plan Discussed with: CRNA and Surgeon  Anesthesia Plan Comments:         Anesthesia Quick Evaluation

## 2012-02-26 NOTE — H&P (Signed)
Shane Bond is an 52 y.o. male.   Chief Complaint: Left flank pain with fever and newly diagnosed ureteral calculus HPI: Patient is currently 52 years old and a terrible historian. He presents to the emergency room with left flank pain. He reports a history of prior "back injury" with that resulted in his inability to void spontaneously. He has been apparently performing an in out catheterization several times a day. It does not appear that he said any significant urologic followup in a number of years. It does appear that there are some old notes from our urology office in the past when he saw Dr. Darvin Neighbours. The patient's CT scan revealed a 5 mm stone in the left ureter with moderate hydronephrosis. The patient did have an abnormal urinalysis initially was afebrile. He socially spiked a temperature of 102 and has had tachycardia. He is felt to have some urosepsis. I've suggested that he needs a emergent double-J stent. The patient does appear to have a number of medical comorbidities but again is a terrible historian and is unable to provide any additional information. He does not appear to make any attempt to get any, health care insurance coverage or primary care physician.  Past Medical History  Diagnosis Date  . CAD, NATIVE VESSEL 10/29/2009  . 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 OBESITY 10/22/2009    History reviewed. No pertinent past surgical history.  Family History  Problem Relation Age of Onset  . Diabetes Father   . Coronary artery disease Other   . Heart attack Other    Social History:  reports that he has been smoking.  He does not have any smokeless tobacco history on file. He reports that he does not drink alcohol. His drug history not on file.  Allergies: No Known Allergies   (Not in a hospital admission)  Results for orders placed during the hospital encounter of 02/26/12 (from the past 48 hour(s))  CBC WITH DIFFERENTIAL     Status: Abnormal   Collection Time   02/26/12   6:02 AM      Component Value Range Comment   WBC 2.9 (*) 4.0 - 10.5 K/uL    RBC 4.61  4.22 - 5.81 MIL/uL    Hemoglobin 15.0  13.0 - 17.0 g/dL    HCT 47.8  29.5 - 62.1 %    MCV 95.9  78.0 - 100.0 fL    MCH 32.5  26.0 - 34.0 pg    MCHC 33.9  30.0 - 36.0 g/dL    RDW 30.8  65.7 - 84.6 %    Platelets 154  150 - 400 K/uL    Neutrophils Relative 78 (*) 43 - 77 %    Neutro Abs 2.3  1.7 - 7.7 K/uL    Lymphocytes Relative 21  12 - 46 %    Lymphs Abs 0.6 (*) 0.7 - 4.0 K/uL    Monocytes Relative 1 (*) 3 - 12 %    Monocytes Absolute 0.0 (*) 0.1 - 1.0 K/uL    Eosinophils Relative 1  0 - 5 %    Eosinophils Absolute 0.0  0.0 - 0.7 K/uL    Basophils Relative 0  0 - 1 %    Basophils Absolute 0.0  0.0 - 0.1 K/uL   COMPREHENSIVE METABOLIC PANEL     Status: Abnormal   Collection Time   02/26/12  6:02 AM      Component Value Range Comment   Sodium 140  135 - 145 mEq/L  Potassium 4.7  3.5 - 5.1 mEq/L    Chloride 105  96 - 112 mEq/L    CO2 23  19 - 32 mEq/L    Glucose, Bld 130 (*) 70 - 99 mg/dL    BUN 12  6 - 23 mg/dL    Creatinine, Ser 1.47  0.50 - 1.35 mg/dL    Calcium 9.7  8.4 - 82.9 mg/dL    Total Protein 8.0  6.0 - 8.3 g/dL    Albumin 3.9  3.5 - 5.2 g/dL    AST 17  0 - 37 U/L    ALT 20  0 - 53 U/L    Alkaline Phosphatase 138 (*) 39 - 117 U/L    Total Bilirubin 0.4  0.3 - 1.2 mg/dL    GFR calc non Af Amer 82 (*) >90 mL/min    GFR calc Af Amer >90  >90 mL/min   LIPASE, BLOOD     Status: Normal   Collection Time   02/26/12  6:02 AM      Component Value Range Comment   Lipase 31  11 - 59 U/L   URINALYSIS, ROUTINE W REFLEX MICROSCOPIC     Status: Abnormal   Collection Time   02/26/12  8:06 AM      Component Value Range Comment   Color, Urine YELLOW  YELLOW    APPearance CLOUDY (*) CLEAR    Specific Gravity, Urine 1.016  1.005 - 1.030    pH 6.5  5.0 - 8.0    Glucose, UA NEGATIVE  NEGATIVE mg/dL    Hgb urine dipstick SMALL (*) NEGATIVE    Bilirubin Urine NEGATIVE  NEGATIVE    Ketones, ur  NEGATIVE  NEGATIVE mg/dL    Protein, ur NEGATIVE  NEGATIVE mg/dL    Urobilinogen, UA 0.2  0.0 - 1.0 mg/dL    Nitrite POSITIVE (*) NEGATIVE    Leukocytes, UA MODERATE (*) NEGATIVE   URINE MICROSCOPIC-ADD ON     Status: Abnormal   Collection Time   02/26/12  8:06 AM      Component Value Range Comment   WBC, UA 11-20  <3 WBC/hpf    Bacteria, UA MANY (*) RARE    Ct Abdomen Pelvis Wo Contrast  02/26/2012  *RADIOLOGY REPORT*  Clinical Data: Flank pain and left hydronephrosis.  CT ABDOMEN AND PELVIS WITHOUT CONTRAST  Technique:  Multidetector CT imaging of the abdomen and pelvis was performed following the standard protocol without intravenous contrast.  Comparison: Ultrasound 02/26/2012 and CT 10/30/2010  Findings: Lung bases are clear.  There is no evidence for free intraperitoneal air.  There is a large ventral hernia containing fat above the umbilicus.  There is moderate left hydronephrosis due to a 5 mm stone in the proximal left ureter.  Low density structure in the right kidney is consistent with a cyst.  No evidence for right hydronephrosis or right kidney stones.  No gross abnormality to the liver, gallbladder, spleen, pancreas or right adrenal gland.  There is fullness of the left adrenal gland medial limb.  Hounsfield units in this area roughly measures 7 mm and this may be related to hyperplasia or an adenoma.  Mid abdominal aorta measures 2.7 cm with atherosclerotic calcifications.  No significant free fluid or lymphadenopathy.  No gross abnormality to the prostate, seminal vesicles or urinary bladder.  There is a large amount of stool in the left colon.  The patient appears to have a high riding cecum.  There is disc space loss  at L3-L4 and mild retrolisthesis at L3-4.  Disc space disease at L5-S1.  IMPRESSION: Moderate left hydronephrosis due to a 5 mm stone in the left ureter.  Large amount of stool along the left side of the colon.  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  Large ventral hernia  containing fat.   Original Report Authenticated By: Richarda Overlie, M.D.    US Abdomen Complete  02/26/2012  *RADIOLOGY REPORT*  Clinical Data:  Left flank pain.  COMPLETE ABDOMINAL ULTRASOUND  Comparison:  Abdominal CT 10/30/2010  Findings:  Gallbladder:  No gallstones or pericholecystic fluid. Gallbladder wall is upper limits of normal, roughly measuring 3 mm.  The patient does not have a sonographic Murphy's sign.  Common bile duct:  Measures 0.4 cm.  Liver:  No focal lesion identified.  Within normal limits in parenchymal echogenicity.  IVC:  Appears normal.  Pancreas:  No focal abnormality seen.  Spleen:  Spleen measures 8.9 cm in length.  Right Kidney:  Right kidney measures 13.1 cm without hydronephrosis.  There is a round hypoechoic structure with acoustic enhancement in the upper pole.  This finding is consistent with a cyst which measures up to 2 cm.  There is mild fullness of the renal pelvis.  Left Kidney:  Left kidney measures 14.5 cm in length.  There is dilatation of the left renal collecting system.  Proximal ureter measures up to 1.2 cm.  Abdominal aorta:  The mid and distal abdominal aorta are mildly irregular, measuring up to 2.7 cm.  No evidence for aneurysmal dilatation.  IMPRESSION: Mild-moderate left hydronephrosis.  Based on history of left flank pain, a ureteral lesion or stone cannot be excluded.  The left hydronephrosis could be further evaluated with a CT of the abdomen and pelvis.  Right renal cyst.   Original Report Authenticated By: Richarda Overlie, M.D.     Review of Systems - Negative except significant left flank and abdominal pain along with nausea.  Blood pressure 109/54, pulse 113, temperature 102 F (38.9 C), temperature source Oral, resp. rate 20, SpO2 90.00%. General appearance: alert, cooperative and no distress Neck: no adenopathy and no JVD Resp: clear to auscultation bilaterally Cardio: regular rate and rhythm GI: soft, non-tender no significant CVA tenderness. Male  genitalia: normal, penis: no lesions or discharge. testes: no masses or tenderness. no hernias Extremities: extremities normal, atraumatic, no cyanosis or edema Skin: Skin color, texture, turgor normal. No rashes or lesions Neurologic: Grossly normal  Assessment/Plan Patient has a left ureteral calculus with concurrent febrile urinary tract infection. Options included percutaneous nephrostomy tube drainage versus double-J stent placement.  I discussed the importance of decompressing the kidney with the patient in the setting of obstruction and what appears to be a concurrent infection. He will need to be monitored closely at least on a telemetry bed and potentially in a stepped on her ICU depending on how he does clinically over the next several hours. We hope to get his double-J stent in place as soon as possible. If anesthesia has significant concerns about his cardiac issues then we may ask interventional radiology to place nephrostomy tube and stent. The patient has been given an injection of Rocephin.  Magaret Justo S 02/26/2012, 10:24 AM

## 2012-02-26 NOTE — Preoperative (Signed)
Beta Blockers   Reason not to administer Beta Blockers:Not Applicable 

## 2012-02-26 NOTE — Progress Notes (Signed)
pacu nursing:  pts cbg 145.  Dr. Isabel Caprice made aware.  No new orders received.

## 2012-02-26 NOTE — Op Note (Signed)
Preoperative diagnosis:  1. Left ureteral stone with urosepsis   Postoperative diagnosis:  1. Same 2. Partially duplicated left renal collecting system   Procedure:  1. Cystoscopy 2. Left ureteral stent placement (6 [place]) 24 cm 3. Left retrograde pyelography with interpretation   Surgeon: Valetta Fuller, MD  Anesthesia: General  Complications: None  Intraoperative findings: Left retrograde pyelogram was done with fluoroscopic interpretation. The patient had a partially duplicated left collecting system. The patient's stone was in the mid ureter of the lower pole collecting system. A filling defect was noted which then moved more proximal with further injection of contrast.  EBL: Minimal  Specimens: None  Indication: Shane Bond is a 52 y.o. patient with development of severe left renal colic and fever. The patient apparently is neurogenic bladder and catheterizes himself. He has had no recent urologic followup and is a very poor historian he does not appear to take good care of himself.. After reviewing the management options for treatment, he elected to proceed with the above surgical procedure(s). We have discussed the potential benefits and risks of the procedure, side effects of the proposed treatment, the likelihood of the patient achieving the goals of the procedure, and any potential problems that might occur during the procedure or recuperation. Informed consent has been obtained.  Description of procedure:  The patient was taken to the operating room and general anesthesia was induced.  The patient was placed in the dorsal lithotomy position, prepped and draped in the usual sterile fashion, and preoperative antibiotics were administered. A preoperative time-out was performed.   Cystourethroscopy was performed.  The patient's urethra was examined and was normal. The bladder was then systematically examined in its entirety. There was no evidence for any bladder tumors,  stones, or other mucosal pathology.    Attention then turned to the left ureteral orifice and a ureteral catheter was used to intubate the ureteral orifice.  Omnipaque contrast was injected through the ureteral catheter and a retrograde pyelogram was performed with findings as dictated above.  A 0.38 sensor guidewire was then advanced up the left ureter into the lower pole renal pelvis under fluoroscopic guidance.  The wire was then backloaded through the cystoscope and a ureteral stent was advance over the wire using Seldinger technique.  The stent was positioned appropriately under fluoroscopic and cystoscopic guidance.  The wire was then removed with an adequate stent curl noted in the renal pelvis as well as in the bladder.  The bladder was then emptied and the procedure ended.  The patient appeared to tolerate the procedure well and without complications.  The patient was able to be awakened and transferred to the recovery unit in satisfactory condition.    Valetta Fuller, MD

## 2012-02-26 NOTE — ED Notes (Signed)
EMS called to home.  Found patient on couch with complaints of left flank pain  That radiates around to his abdomen.  Currently he rates his pain 8 of 10. He confirms nausea and vomiting.

## 2012-02-26 NOTE — ED Notes (Signed)
In and out cath attempted and returned no urine.  Pt transported to ultra-sound.

## 2012-02-26 NOTE — ED Notes (Signed)
Bed:WA13<BR> Expected date:<BR> Expected time:<BR> Means of arrival:<BR> Comments:<BR> EMS

## 2012-02-26 NOTE — ED Provider Notes (Signed)
History     CSN: [id number]  Arrival date & time 02/26/12  0525   First MD Initiated Contact with Patient 02/26/12 [phone]      Chief Complaint  Patient presents with  . Flank Pain    (Consider location/radiation/quality/duration/timing/severity/associated sxs/prior treatment) HPI  Shane Bond is a 52 y.o. male with acute onset of left flank pain at approximately 2 AM pain radiates around to the frontal abdomen. In severe 8/10. Associated with nausea and single episode of nonbloody nonbilious emesis. Patient denies hematuria, history of kidney stones, syncope, history of AAA, change in bowel or bladder habits, chest pain, shortness of breath, palpitations. Patient has past medical history significant for tobacco use disorder, ACS, obesity, low back injury that requires catheterization for urination.   Past Medical History  Diagnosis Date  . CAD, NATIVE VESSEL 10/29/2009  . 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 OBESITY 10/22/2009    History reviewed. No pertinent past surgical history.  Family History  Problem Relation Age of Onset  . Diabetes Father   . Coronary artery disease Other   . Heart attack Other     History  Substance Use Topics  . Smoking status: Current Every Day Smoker  . Smokeless tobacco: Not on file  . Alcohol Use: No      Review of Systems  Constitutional: Negative for fever.  Respiratory: Negative for shortness of breath.   Cardiovascular: Negative for chest pain.  Gastrointestinal: Positive for nausea and vomiting. Negative for abdominal pain and diarrhea.  Genitourinary: Positive for flank pain. Negative for dysuria.  All other systems reviewed and are negative.    Allergies  Review of patient's allergies indicates no known allergies.  Home Medications   Current Outpatient Rx  Name  Route  Sig  Dispense  Refill  . ASPIRIN 81 MG PO TBEC   Oral   Take 1 tablet (81 mg total) by mouth daily.   30 tablet   0     BP 130/65  Pulse 64  Temp  97.8 F (36.6 C) (Oral)  Resp 18  SpO2 100%  Physical Exam  Nursing note and vitals reviewed. Constitutional: He is oriented to person, place, and time. He appears well-developed and well-nourished. No distress.  HENT:  Head: Normocephalic.  Mouth/Throat: Oropharynx is clear and moist.  Eyes: Conjunctivae normal and EOM are normal.  Neck: Normal range of motion.  Cardiovascular: Normal rate.   Pulmonary/Chest: Effort normal and breath sounds normal. No stridor.  Abdominal: Soft. Bowel sounds are normal. He exhibits no distension and no mass. There is no tenderness. There is no rebound and no guarding.  Genitourinary:       No CVA Tenderness Bilaterally  Musculoskeletal: Normal range of motion.  Neurological: He is alert and oriented to person, place, and time.  Psychiatric: He has a normal mood and affect.    ED Course  Procedures (including critical care time)  Labs Reviewed  URINALYSIS, ROUTINE W REFLEX MICROSCOPIC - Abnormal; Notable for the following:    APPearance CLOUDY (*)     Hgb urine dipstick SMALL (*)     Nitrite POSITIVE (*)     Leukocytes, UA MODERATE (*)     All other components within normal limits  CBC WITH DIFFERENTIAL - Abnormal; Notable for the following:    WBC 2.9 (*)     Neutrophils Relative 78 (*)     Lymphs Abs 0.6 (*)     Monocytes Relative 1 (*)     Monocytes  Absolute 0.0 (*)     All other components within normal limits  COMPREHENSIVE METABOLIC PANEL - Abnormal; Notable for the following:    Glucose, Bld 130 (*)     Alkaline Phosphatase 138 (*)     GFR calc non Af Amer 82 (*)     All other components within normal limits  URINE MICROSCOPIC-ADD ON - Abnormal; Notable for the following:    Bacteria, UA MANY (*)     All other components within normal limits  LIPASE, BLOOD  URINE CULTURE   Ct Abdomen Pelvis Wo Contrast  02/26/2012  *RADIOLOGY REPORT*  Clinical Data: Flank pain and left hydronephrosis.  CT ABDOMEN AND PELVIS WITHOUT CONTRAST   Technique:  Multidetector CT imaging of the abdomen and pelvis was performed following the standard protocol without intravenous contrast.  Comparison: Ultrasound 02/26/2012 and CT 10/30/2010  Findings: Lung bases are clear.  There is no evidence for free intraperitoneal air.  There is a large ventral hernia containing fat above the umbilicus.  There is moderate left hydronephrosis due to a 5 mm stone in the proximal left ureter.  Low density structure in the right kidney is consistent with a cyst.  No evidence for right hydronephrosis or right kidney stones.  No gross abnormality to the liver, gallbladder, spleen, pancreas or right adrenal gland.  There is fullness of the left adrenal gland medial limb.  Hounsfield units in this area roughly measures 7 mm and this may be related to hyperplasia or an adenoma.  Mid abdominal aorta measures 2.7 cm with atherosclerotic calcifications.  No significant free fluid or lymphadenopathy.  No gross abnormality to the prostate, seminal vesicles or urinary bladder.  There is a large amount of stool in the left colon.  The patient appears to have a high riding cecum.  There is disc space loss at L3-L4 and mild retrolisthesis at L3-4.  Disc space disease at L5-S1.  IMPRESSION: Moderate left hydronephrosis due to a 5 mm stone in the left ureter.  Large amount of stool along the left side of the colon.  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  Large ventral hernia containing fat.   Original Report Authenticated By: Richarda Overlie, M.D.    US Abdomen Complete  02/26/2012  *RADIOLOGY REPORT*  Clinical Data:  Left flank pain.  COMPLETE ABDOMINAL ULTRASOUND  Comparison:  Abdominal CT 10/30/2010  Findings:  Gallbladder:  No gallstones or pericholecystic fluid. Gallbladder wall is upper limits of normal, roughly measuring 3 mm.  The patient does not have a sonographic Murphy's sign.  Common bile duct:  Measures 0.4 cm.  Liver:  No focal lesion identified.  Within normal limits in parenchymal  echogenicity.  IVC:  Appears normal.  Pancreas:  No focal abnormality seen.  Spleen:  Spleen measures 8.9 cm in length.  Right Kidney:  Right kidney measures 13.1 cm without hydronephrosis.  There is a round hypoechoic structure with acoustic enhancement in the upper pole.  This finding is consistent with a cyst which measures up to 2 cm.  There is mild fullness of the renal pelvis.  Left Kidney:  Left kidney measures 14.5 cm in length.  There is dilatation of the left renal collecting system.  Proximal ureter measures up to 1.2 cm.  Abdominal aorta:  The mid and distal abdominal aorta are mildly irregular, measuring up to 2.7 cm.  No evidence for aneurysmal dilatation.  IMPRESSION: Mild-moderate left hydronephrosis.  Based on history of left flank pain, a ureteral lesion or  stone cannot be excluded.  The left hydronephrosis could be further evaluated with a CT of the abdomen and pelvis.  Right renal cyst.   Original Report Authenticated By: Richarda Overlie, M.D.      1. Sepsis   2. Kidney stone       MDM  dDx: AAA, nephrolithiasis, pyelo  CT shows a moderate left eye trauma and a 5 mm stone in the left ureter.   Urinalysis shows many bacteria, 20 white blood cells with nitrite and leuk esterase. Patient has no leukocytosis.  1g IV Rocephin given  Consult from Urologist Dr. 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 He recommends d/c home on Abx and outpt follow-up, unless his pain is not controlled, in which case he will come to evaluate the Pt.   1 mg Dilauded given.   The patient developed a fever to 102 and tachycardia, reaching sepsis criteria. Attending Dr. Rhunette Croft has consulted with urologist Dr. Isabel Caprice who come to evaluate and admit the patient.       Wynetta Emery, PA-C 02/28/12 [phone]

## 2012-02-26 NOTE — Anesthesia Postprocedure Evaluation (Signed)
  Anesthesia Post-op Note  Patient: Shane Bond  Procedure(s) Performed: Procedure(s) (LRB): CYSTOSCOPY WITH RETROGRADE PYELOGRAM/URETERAL STENT PLACEMENT (Left)  Patient Location: PACU  Anesthesia Type: General  Level of Consciousness: awake and alert   Airway and Oxygen Therapy: Patient Spontanous Breathing  Post-op Pain: mild  Post-op Assessment: Post-op Vital signs reviewed, Patient's Cardiovascular Status Stable, Respiratory Function Stable, Patent Airway and No signs of Nausea or vomiting  Last Vitals:  Filed Vitals:   02/26/12 1200  BP: 107/60  Pulse: 105  Temp: 37.9 C  Resp: 24    Post-op Vital Signs: stable   Complications: No apparent anesthesia complications

## 2012-02-27 ENCOUNTER — Encounter (HOSPITAL_COMMUNITY): Payer: Self-pay | Admitting: Urology

## 2012-02-27 LAB — BASIC METABOLIC PANEL
CO2: 21 mEq/L (ref 19–32)
Calcium: 8.2 mg/dL — ABNORMAL LOW (ref 8.4–10.5)
Creatinine, Ser: 1.3 mg/dL (ref 0.50–1.35)
GFR calc Af Amer: 73 mL/min — ABNORMAL LOW (ref >90)
Sodium: 136 mEq/L (ref 135–145)

## 2012-02-27 LAB — CBC
MCH: 33.5 pg (ref 26.0–34.0)
MCV: 95.8 fL (ref 78.0–100.0)
Platelets: ADEQUATE 10*3/uL (ref 150–400)
RBC: 3.58 MIL/uL — ABNORMAL LOW (ref 4.22–5.81)
RDW: 13.5 % (ref 11.5–15.5)
WBC: 19.7 10*3/uL — ABNORMAL HIGH (ref 4.0–10.5)

## 2012-02-27 MED ORDER — ACETAMINOPHEN 325 MG PO TABS
650.0000 mg | ORAL_TABLET | ORAL | Status: DC | PRN
  Administered 2012-02-27 – 2012-02-28 (×4): 650 mg via ORAL
  Filled 2012-02-27 (×4): qty 2

## 2012-02-27 NOTE — Progress Notes (Signed)
Patient ID: Shane Bond, male   DOB: May 05, 1960, 52 y.o.   MRN: [id number] 1 Day Post-Op Subjective: Patient reports having a quiet night. No rule problems or issues. He has remained mildly hypotensive but is had relatively normal mental status and has had good urine output. He is currently afebrile.  Objective: Vital signs in last 24 hours: Temp:  [97 F (36.1 C)-102 F (38.9 C)] 98.5 F (36.9 C) (01/06 0537) Pulse Rate:  [79-113] 84  (01/06 0537) Resp:  [18-24] 20  (01/05 2156) BP: (80-115)/(51-68) 91/59 mmHg (01/06 0537) SpO2:  [90 %-100 %] 95 % (01/06 0537) Weight:  [125.193 kg (276 lb)] 125.193 kg (276 lb) (01/05 1353)  Intake/Output from previous day: 01/05 0701 - 01/06 0700 In: 4558.8 [P.O.:840; I.V.:3218.8; IV Piggyback:500] Out: 1450 [Urine:1450] Intake/Output this shift: Total I/O In: -  Out: 200 [Urine:200]  Physical Exam:  Constitutional: Vital signs reviewed. WD WN in NAD   Eyes: PERRL, No scleral icterus.   Cardiovascular: RRR Pulmonary/Chest: Normal effort Abdominal: Soft. Non-tender, non-distended, bowel sounds are normal, no masses, organomegaly, or guarding present.  Genitourinary: Foley catheter removed. Extremities: No cyanosis or edema   Lab Results:  Basename 02/27/12 0444 02/26/12 0602  HGB 12.0* 15.0  HCT 34.3* 44.2   BMET  Basename 02/27/12 0444 02/26/12 0602  NA 136 140  K 5.2* 4.7  CL 106 105  CO2 21 23  GLUCOSE 152* 130*  BUN 17 12  CREATININE 1.30 1.04  CALCIUM 8.2* 9.7   No results found for this basename: LABPT:3,INR:3 in the last 72 hours No results found for this basename: LABURIN:1 in the last 72 hours Results for orders placed during the hospital encounter of 10/30/10  URINE CULTURE     Status: Normal   Collection Time   10/30/10  6:36 PM      Component Value Range Status Comment   Specimen Description URINE, CLEAN CATCH   Final    Special Requests NONE   Final    Culture  Setup Time [id number]   Final    Colony Count  >=100,000 COLONIES/ML   Final    Culture     Final    Value: KLEBSIELLA PNEUMONIAE     MORGANELLA MORGANII   Report Status 11/03/2010 FINAL   Final    Organism ID, Bacteria KLEBSIELLA PNEUMONIAE   Final    Organism ID, Bacteria MORGANELLA MORGANII   Final     Studies/Results: Ct Abdomen Pelvis Wo Contrast  02/26/2012  *RADIOLOGY REPORT*  Clinical Data: Flank pain and left hydronephrosis.  CT ABDOMEN AND PELVIS WITHOUT CONTRAST  Technique:  Multidetector CT imaging of the abdomen and pelvis was performed following the standard protocol without intravenous contrast.  Comparison: Ultrasound 02/26/2012 and CT 10/30/2010  Findings: Lung bases are clear.  There is no evidence for free intraperitoneal air.  There is a large ventral hernia containing fat above the umbilicus.  There is moderate left hydronephrosis due to a 5 mm stone in the proximal left ureter.  Low density structure in the right kidney is consistent with a cyst.  No evidence for right hydronephrosis or right kidney stones.  No gross abnormality to the liver, gallbladder, spleen, pancreas or right adrenal gland.  There is fullness of the left adrenal gland medial limb.  Hounsfield units in this area roughly measures 7 mm and this may be related to hyperplasia or an adenoma.  Mid abdominal aorta measures 2.7 cm with atherosclerotic calcifications.  No significant free fluid or  lymphadenopathy.  No gross abnormality to the prostate, seminal vesicles or urinary bladder.  There is a large amount of stool in the left colon.  The patient appears to have a high riding cecum.  There is disc space loss at L3-L4 and mild retrolisthesis at L3-4.  Disc space disease at L5-S1.  IMPRESSION: Moderate left hydronephrosis due to a 5 mm stone in the left ureter.  Large amount of stool along the left side of the colon.  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  Large ventral hernia containing fat.   Original Report Authenticated By: Richarda Overlie, M.D.    US Abdomen  Complete  02/26/2012  *RADIOLOGY REPORT*  Clinical Data:  Left flank pain.  COMPLETE ABDOMINAL ULTRASOUND  Comparison:  Abdominal CT 10/30/2010  Findings:  Gallbladder:  No gallstones or pericholecystic fluid. Gallbladder wall is upper limits of normal, roughly measuring 3 mm.  The patient does not have a sonographic Murphy's sign.  Common bile duct:  Measures 0.4 cm.  Liver:  No focal lesion identified.  Within normal limits in parenchymal echogenicity.  IVC:  Appears normal.  Pancreas:  No focal abnormality seen.  Spleen:  Spleen measures 8.9 cm in length.  Right Kidney:  Right kidney measures 13.1 cm without hydronephrosis.  There is a round hypoechoic structure with acoustic enhancement in the upper pole.  This finding is consistent with a cyst which measures up to 2 cm.  There is mild fullness of the renal pelvis.  Left Kidney:  Left kidney measures 14.5 cm in length.  There is dilatation of the left renal collecting system.  Proximal ureter measures up to 1.2 cm.  Abdominal aorta:  The mid and distal abdominal aorta are mildly irregular, measuring up to 2.7 cm.  No evidence for aneurysmal dilatation.  IMPRESSION: Mild-moderate left hydronephrosis.  Based on history of left flank pain, a ureteral lesion or stone cannot be excluded.  The left hydronephrosis could be further evaluated with a CT of the abdomen and pelvis.  Right renal cyst.   Original Report Authenticated By: Richarda Overlie, M.D.     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 Patient is status post double-J stent placement. Urine culture data is not available. I would like the patient to stay an additional day for observation. The patient will subsequently need outpatient followup with a decision about definitive management of the stone once the infection has completely resolved. Foley catheter removed and the patient will go back to intermittent catheterization. Social work and caseworker consultations  today to try to assist in obtaining this patient some type of health coverage so he can have ongoing medical care directed towards a number of issues.   LOS: 1 day   Remingtyn Depaola S 02/27/2012, 7:50 AM

## 2012-02-27 NOTE — Progress Notes (Signed)
Talked to patient about follow up medical care and information given to patient about places to go such as the [facility] [employer] [facility], [facility], [facility], [facility], [facility] (physician referral line) also physician that will accept patient's that do not have insurance - Dr Fleet Contras, Dr Doretha Imus, Dr Maryelizabeth Rowan, Dr Jackquline Denmark- CM informed patient to read over the information and decide where he wants to go for follow up health care and how important it is for him to do that. ( patient stated that he lives at home and his mother pays his bills, CM encouraged the patient to have his mother pay for him to see a physician until he gets on his feet).  CM informed patient about the medication assistance program at [facility], [facility] and [facility] that have the generic $4.00 medication program. Patient also qualifies for the indigent funds (the [facility] program that will assist the patient once a year with his medication for 34 days).  CM instructed the patient to go on the internet www.healthcare.gov to see which insurance program that would be the best for him. Patient stated that he has tried to get on Medicaid but did not qualify. Shane Bond,Financial Counselor called to see the patient also.   Jiles Crocker RN,BSN,MHA Case Management [phone]

## 2012-02-28 ENCOUNTER — Inpatient Hospital Stay (HOSPITAL_COMMUNITY): Payer: Self-pay

## 2012-02-28 LAB — BASIC METABOLIC PANEL
Calcium: 9 mg/dL (ref 8.4–10.5)
GFR calc Af Amer: 75 mL/min — ABNORMAL LOW (ref >90)
GFR calc non Af Amer: 64 mL/min — ABNORMAL LOW (ref >90)
Potassium: 4.2 mEq/L (ref 3.5–5.1)
Sodium: 136 mEq/L (ref 135–145)

## 2012-02-28 LAB — CBC
Hemoglobin: 12.1 g/dL — ABNORMAL LOW (ref 13.0–17.0)
Platelets: 93 10*3/uL — ABNORMAL LOW (ref 150–400)
RBC: 3.7 MIL/uL — ABNORMAL LOW (ref 4.22–5.81)
WBC: 15 10*3/uL — ABNORMAL HIGH (ref 4.0–10.5)

## 2012-02-28 LAB — URINE CULTURE: Colony Count: >=100000

## 2012-02-28 NOTE — Progress Notes (Signed)
Patient ID: Shane Bond, male   DOB: [birth year], 52 y.o.   MRN: [id number] 2 Days Post-Op Subjective: Patient reports no new issues or concerns. He is having no difficulty with his in and out catheterization. He is not having significant pain. Urine culture is positive for gram-negative rods with sensitivities remain pending.  Objective: Vital signs in last 24 hours: Temp:  [98.1 F (36.7 C)-101.5 F (38.6 C)] 98.1 F (36.7 C) (01/07 0513) Pulse Rate:  [60-84] 60  (01/07 0513) Resp:  [18-21] 18  (01/07 0513) BP: (97-106)/(54-56) 106/56 mmHg (01/07 0513) SpO2:  [95 %-97 %] 96 % (01/07 0513)  Intake/Output from previous day: 01/06 0701 - 01/07 0700 In: 3214.6 [I.V.:3164.6; IV Piggyback:50] Out: 3051 [Urine:3050; Stool:1] Intake/Output this shift:    Physical Exam:  Constitutional: Vital signs reviewed. WD WN in NAD   Eyes: PERRL, No scleral icterus.   Cardiovascular: RRR Pulmonary/Chest: Normal effort Abdominal: Soft. Non-tender, non-distended, bowel sounds are normal, no masses, organomegaly, or guarding present.  Genitourinary: No change Extremities: No cyanosis or edema   Lab Results:  Basename 02/28/12 0445 02/27/12 0444 02/26/12 0602  HGB 12.1* 12.0* 15.0  HCT 35.9* 34.3* 44.2   BMET  Basename 02/28/12 0445 02/27/12 0444  NA 136 136  K 4.2 5.2*  CL 105 106  CO2 23 21  GLUCOSE 150* 152*  BUN 15 17  CREATININE 1.27 1.30  CALCIUM 9.0 8.2*   No results found for this basename: LABPT:3,INR:3 in the last 72 hours No results found for this basename: LABURIN:1 in the last 72 hours Results for orders placed during the hospital encounter of 02/26/12  URINE CULTURE     Status: Normal (Preliminary result)   Collection Time   02/26/12  8:06 AM      Component Value Range Status Comment   Specimen Description URINE, RANDOM   Final    Special Requests NONE   Final    Culture  Setup Time 02/26/2012 17:24   Final    Colony Count >=100,000 COLONIES/ML   Final    Culture  GRAM NEGATIVE RODS   Final    Report Status PENDING   Incomplete     Studies/Results: Ct Abdomen Pelvis Wo Contrast  02/26/2012  *RADIOLOGY REPORT*  Clinical Data: Flank pain and left hydronephrosis.  CT ABDOMEN AND PELVIS WITHOUT CONTRAST  Technique:  Multidetector CT imaging of the abdomen and pelvis was performed following the standard protocol without intravenous contrast.  Comparison: Ultrasound 02/26/2012 and CT 10/30/2010  Findings: Lung bases are clear.  There is no evidence for free intraperitoneal air.  There is a large ventral hernia containing fat above the umbilicus.  There is moderate left hydronephrosis due to a 5 mm stone in the proximal left ureter.  Low density structure in the right kidney is consistent with a cyst.  No evidence for right hydronephrosis or right kidney stones.  No gross abnormality to the liver, gallbladder, spleen, pancreas or right adrenal gland.  There is fullness of the left adrenal gland medial limb.  Hounsfield units in this area roughly measures 7 mm and this may be related to hyperplasia or an adenoma.  Mid abdominal aorta measures 2.7 cm with atherosclerotic calcifications.  No significant free fluid or lymphadenopathy.  No gross abnormality to the prostate, seminal vesicles or urinary bladder.  There is a large amount of stool in the left colon.  The patient appears to have a high riding cecum.  There is disc space loss at L3-L4 and  mild retrolisthesis at L3-4.  Disc space disease at L5-S1.  IMPRESSION: Moderate left hydronephrosis due to a 5 mm stone in the left ureter.  Large amount of stool along the left side of the colon.  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  Large ventral hernia containing fat.   Original Report Authenticated By: Richarda Overlie, M.D.    US Abdomen Complete  02/26/2012  *RADIOLOGY REPORT*  Clinical Data:  Left flank pain.  COMPLETE ABDOMINAL ULTRASOUND  Comparison:  Abdominal CT 10/30/2010  Findings:  Gallbladder:  No gallstones or  pericholecystic fluid. Gallbladder wall is upper limits of normal, roughly measuring 3 mm.  The patient does not have a sonographic Murphy's sign.  Common bile duct:  Measures 0.4 cm.  Liver:  No focal lesion identified.  Within normal limits in parenchymal echogenicity.  IVC:  Appears normal.  Pancreas:  No focal abnormality seen.  Spleen:  Spleen measures 8.9 cm in length.  Right Kidney:  Right kidney measures 13.1 cm without hydronephrosis.  There is a round hypoechoic structure with acoustic enhancement in the upper pole.  This finding is consistent with a cyst which measures up to 2 cm.  There is mild fullness of the renal pelvis.  Left Kidney:  Left kidney measures 14.5 cm in length.  There is dilatation of the left renal collecting system.  Proximal ureter measures up to 1.2 cm.  Abdominal aorta:  The mid and distal abdominal aorta are mildly irregular, measuring up to 2.7 cm.  No evidence for aneurysmal dilatation.  IMPRESSION: Mild-moderate left hydronephrosis.  Based on history of left flank pain, a ureteral lesion or stone cannot be excluded.  The left hydronephrosis could be further evaluated with a CT of the abdomen and pelvis.  Right renal cyst.   Original Report Authenticated By: Richarda Overlie, M.D.     Assessment/Plan:   Ureteral calculus with febrile urinary tract infection. Clinically he's doing well but is still been febrile. It would be prudent to continue IV antibiotics until final culture and sensitivity data is available. He will then need to be transitioned to oral antibiotic but it would be important for him to be afebrile at least 24 hours. Possible discharge tomorrow a.m.   LOS: 2 days   Mecca Guitron S 02/28/2012, 7:24 AM

## 2012-02-29 MED ORDER — HYDROCODONE-ACETAMINOPHEN 5-325 MG PO TABS: 1.0000 | ORAL_TABLET | Freq: Four times a day (QID) | ORAL | Status: DC | PRN

## 2012-02-29 MED ORDER — CIPROFLOXACIN HCL 500 MG PO TABS: 500.0000 mg | ORAL_TABLET | Freq: Two times a day (BID) | ORAL | Status: DC

## 2012-02-29 NOTE — ED Provider Notes (Signed)
Medical screening examination/treatment/procedure(s) were conducted as a shared visit with non-physician practitioner(s) and myself.  I personally evaluated the patient during the encounter  Derwood Kaplan, MD 02/29/12 2259

## 2012-02-29 NOTE — Discharge Summary (Signed)
Physician Discharge Summary  Patient ID: Shane Bond MRN: [id number] DOB/AGE: 08-03-1960 51 y.o.  Admit date: 02/26/2012 Discharge date: 02/29/2012  Admission Diagnoses:  Discharge Diagnoses:  Active Problems:  * No active hospital problems. *    Discharged Condition: good  Hospital Course: Patient presented with left flank pain. He was diagnosed with a 5 mm ureteral calculus. He developed a fever and had abnormal urinalysis. For that reason we felt he needed emergent decompression of the left collecting system with definitive stone management at a later date. The patient was noted to have a partially duplicated left collecting system on retrograde pyelography. The stone was obstructing the ureter this applied primarily the mid to lower pole of the kidney. A double-J stent was placed. The patient did well clinically. The patient did continue to be febrile but is now been afebrile for 24+ hours. Urine culture was positive for Klebsiella sensitive to his antibiotics. It is also sensitive to oral ciprofloxacin which will be discharged on.  Consults: None  Significant Diagnostic Studies: radiology: CT scan again with findings listed above. No other renal calculi noted  Treatments: surgery: Patient underwent cystoscopy with left retrograde pyelography and left double-J stent placement.  Discharge Exam: Blood pressure 129/78, pulse 51, temperature 98.3 F (36.8 C), temperature source Oral, resp. rate 18, height 6\' 1"  (1.854 m), weight 125.193 kg (276 lb), SpO2 98.00%. Multiple well-nourished male who was nontoxic at the time of discharge. Respiratory: Normal effort  cardiac: Regular rate and rhythm abdomen: Soft nontender.  Disposition: 01-Home or Self Care     Medication List     As of 02/29/2012  9:21 AM    TAKE these medications         aspirin 81 MG EC tablet   Take 1 tablet (81 mg total) by mouth daily.      ciprofloxacin 500 MG tablet   Commonly known as: CIPRO   Take 1  tablet (500 mg total) by mouth 2 (two) times daily.      HYDROcodone-acetaminophen 5-325 MG per tablet   Commonly known as: NORCO/VICODIN   Take 1-2 tablets by mouth every 6 (six) hours as needed for pain.           Follow-up Information    On 03/13/2012 to follow up. (1pm)          Signed: Jalei Shibley S 02/29/2012, 9:21 AM

## 2013-09-08 ENCOUNTER — Encounter (HOSPITAL_COMMUNITY): Payer: Self-pay | Admitting: Emergency Medicine

## 2013-09-08 ENCOUNTER — Emergency Department (HOSPITAL_COMMUNITY)
Admission: EM | Admit: 2013-09-08 | Discharge: 2013-09-08 | Disposition: A | Discharge status: Home / Self Care | Payer: Self-pay | Attending: Emergency Medicine | Admitting: Emergency Medicine

## 2013-09-08 DIAGNOSIS — N390 Urinary tract infection, site not specified: Secondary | ICD-10-CM | POA: Insufficient documentation

## 2013-09-08 DIAGNOSIS — I2510 Atherosclerotic heart disease of native coronary artery without angina pectoris: Secondary | ICD-10-CM | POA: Insufficient documentation

## 2013-09-08 DIAGNOSIS — F17200 Nicotine dependence, unspecified, uncomplicated: Secondary | ICD-10-CM | POA: Insufficient documentation

## 2013-09-08 DIAGNOSIS — E669 Obesity, unspecified: Secondary | ICD-10-CM | POA: Insufficient documentation

## 2013-09-08 DIAGNOSIS — I252 Old myocardial infarction: Secondary | ICD-10-CM | POA: Insufficient documentation

## 2013-09-08 DIAGNOSIS — E119 Type 2 diabetes mellitus without complications: Secondary | ICD-10-CM | POA: Insufficient documentation

## 2013-09-08 DIAGNOSIS — Z9861 Coronary angioplasty status: Secondary | ICD-10-CM | POA: Insufficient documentation

## 2013-09-08 LAB — URINALYSIS, ROUTINE W REFLEX MICROSCOPIC
BILIRUBIN URINE: NEGATIVE
Glucose, UA: NEGATIVE mg/dL
HGB URINE DIPSTICK: NEGATIVE
Ketones, ur: NEGATIVE mg/dL
Nitrite: POSITIVE — AB
PROTEIN: NEGATIVE mg/dL
Specific Gravity, Urine: 1.014 (ref 1.005–1.030)
UROBILINOGEN UA: 0.2 mg/dL (ref 0.0–1.0)
pH: 6 (ref 5.0–8.0)

## 2013-09-08 LAB — URINE MICROSCOPIC-ADD ON

## 2013-09-08 LAB — CBG MONITORING, ED: Glucose-Capillary: 142 mg/dL — ABNORMAL HIGH (ref 70–99)

## 2013-09-08 MED ORDER — CIPROFLOXACIN HCL 500 MG PO TABS: 500.0000 mg | ORAL_TABLET | Freq: Two times a day (BID) | ORAL | Status: DC

## 2013-09-08 MED ORDER — LISINOPRIL 2.5 MG PO TABS: 2.5000 mg | ORAL_TABLET | Freq: Every day | ORAL | Status: DC

## 2013-09-08 MED ORDER — PRAVASTATIN SODIUM 20 MG PO TABS: 20.0000 mg | ORAL_TABLET | Freq: Every day | ORAL | Status: DC

## 2013-09-08 MED ORDER — METOPROLOL TARTRATE 25 MG PO TABS: 12.5000 mg | ORAL_TABLET | Freq: Two times a day (BID) | ORAL | Status: DC

## 2013-09-08 NOTE — ED Notes (Signed)
Patient is alert and oriented x3.  He was given DC instructions and follow up visit instructions.  Patient gave verbal understanding.  He was DC ambulatory under his own power to home.  V/S stable.  He was not showing any signs of distress on DC 

## 2013-09-08 NOTE — ED Notes (Signed)
Pt states that he self caths himself and today noticed that his urine was discolored green. Pt reports that since he has been here, the urine color has cleared. Pt adds that he believes that the color may have come from OTC medications

## 2013-09-08 NOTE — Discharge Instructions (Signed)

## 2013-09-08 NOTE — ED Notes (Signed)
Pt states that he started having green urine today. States he started taking a medication that may have caused it. Will call home to figure out the name of the med. Alert and oriented. States that he self caths at home.

## 2013-09-08 NOTE — ED Provider Notes (Signed)
CSN: [id number]     Arrival date & time 09/08/13  1653 History   First MD Initiated Contact with Patient 09/08/13 1813     Chief Complaint  Patient presents with  . Urine discoloration      (Consider location/radiation/quality/duration/timing/severity/associated sxs/prior Treatment) HPI Comments: Patient presents to the ER for evaluation of green discoloration to his urine. Patient reports that he took an over-the-counter cholesterol medicine from [place] prior to onset of symptoms. Patient denies fever, vomiting, back pain, abd pain, hematuria, muscle pain/cramping. Has not had frequency or dysuria, but does report that his urine has been cloudy.   Past Medical History  Diagnosis Date  . CAD, NATIVE VESSEL 10/29/2009  . 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 OBESITY 10/22/2009  . Diabetes mellitus without complication    Past Surgical History  Procedure Laterality Date  . Cystoscopy w/ ureteral stent placement  02/26/2012    Procedure: CYSTOSCOPY WITH RETROGRADE PYELOGRAM/URETERAL STENT PLACEMENT;  Surgeon: Bernestine Amass, MD;  Location: [facility];  Service: Urology;  Laterality: Left;  . Coronary stent placement     Family History  Problem Relation Age of Onset  . Diabetes Father   . Coronary artery disease Other   . Heart attack Other    History  Substance Use Topics  . Smoking status: Current Every Day Smoker  . Smokeless tobacco: Not on file  . Alcohol Use: No    Review of Systems  Gastrointestinal: Negative for abdominal pain.  Genitourinary: Negative for dysuria, urgency, frequency, hematuria and testicular pain.  All other systems reviewed and are negative.     Allergies  Review of patient's allergies indicates no known allergies.  Home Medications   Prior to Admission medications   Not on File   BP 145/90  Pulse 82  Temp(Src) 98.1 F (36.7 C) (Oral)  SpO2 94% Physical Exam  Constitutional: He is oriented to person, place, and time. He appears well-developed and  well-nourished. No distress.  HENT:  Head: Normocephalic and atraumatic.  Right Ear: Hearing normal.  Left Ear: Hearing normal.  Nose: Nose normal.  Mouth/Throat: Oropharynx is clear and moist and mucous membranes are normal.  Eyes: Conjunctivae and EOM are normal. Pupils are equal, round, and reactive to light.  Neck: Normal range of motion. Neck supple.  Cardiovascular: Regular rhythm, S1 normal and S2 normal.  Exam reveals no gallop and no friction rub.   No murmur heard. Pulmonary/Chest: Effort normal and breath sounds normal. No respiratory distress. He exhibits no tenderness.  Abdominal: Soft. Normal appearance and bowel sounds are normal. There is no hepatosplenomegaly. There is no tenderness. There is no rebound, no guarding, no tenderness at McBurney's point and negative Murphy's sign. No hernia.  Musculoskeletal: Normal range of motion.  Neurological: He is alert and oriented to person, place, and time. He has normal strength. No cranial nerve deficit or sensory deficit. Coordination normal. GCS eye subscore is 4. GCS verbal subscore is 5. GCS motor subscore is 6.  Skin: Skin is warm, dry and intact. No rash noted. No cyanosis.  Psychiatric: He has a normal mood and affect. His speech is normal and behavior is normal. Thought content normal.    ED Course  Procedures (including critical care time) Labs Review Labs Reviewed  URINALYSIS, ROUTINE W REFLEX MICROSCOPIC    Imaging Review No results found.   EKG Interpretation None      MDM   Final diagnoses:  None  UTI  Patient presents with concern over discoloration of  his urine. He reports that he urinated earlier the urine was clean. He has noticed clouding of his urine without dysuria or hematuria. Urinalysis today shows a yellow urine that is positive for nitrite and leukocytes. We'll initiate treatment for UTI, obtain urine culture.    Orpah Greek, MD 09/08/13 2140

## 2013-09-11 LAB — URINE CULTURE: Colony Count: >=100000

## 2013-09-12 ENCOUNTER — Telehealth (HOSPITAL_BASED_OUTPATIENT_CLINIC_OR_DEPARTMENT_OTHER): Payer: Self-pay | Admitting: Emergency Medicine

## 2013-09-12 NOTE — Telephone Encounter (Signed)
Post ED Visit - Positive Culture Follow-up  Culture report reviewed by antimicrobial stewardship pharmacist: []  Wes [place], Pharm.D., BCPS []  Heide Guile, Pharm.D., BCPS []  Alycia Rossetti, Pharm.D., BCPS [x]  [place], [place].D., BCPS, AAHIVP []  Legrand Como, Pharm.D., BCPS, AAHIVP  Positive urine culture Treated with Cipro, organism sensitive to the same and no further patient follow-up is required at this time.  Myrna Blazer 09/12/2013, 6:48 PM

## 2013-10-23 ENCOUNTER — Encounter (HOSPITAL_COMMUNITY): Payer: Self-pay | Admitting: Emergency Medicine

## 2013-10-23 ENCOUNTER — Emergency Department (HOSPITAL_COMMUNITY)
Admission: EM | Admit: 2013-10-23 | Discharge: 2013-10-23 | Disposition: A | Discharge status: Home / Self Care | Payer: Self-pay | Attending: Emergency Medicine | Admitting: Emergency Medicine

## 2013-10-23 DIAGNOSIS — Z9861 Coronary angioplasty status: Secondary | ICD-10-CM | POA: Insufficient documentation

## 2013-10-23 DIAGNOSIS — N390 Urinary tract infection, site not specified: Secondary | ICD-10-CM | POA: Insufficient documentation

## 2013-10-23 DIAGNOSIS — Z7982 Long term (current) use of aspirin: Secondary | ICD-10-CM | POA: Insufficient documentation

## 2013-10-23 DIAGNOSIS — R350 Frequency of micturition: Secondary | ICD-10-CM | POA: Insufficient documentation

## 2013-10-23 DIAGNOSIS — I2510 Atherosclerotic heart disease of native coronary artery without angina pectoris: Secondary | ICD-10-CM | POA: Insufficient documentation

## 2013-10-23 DIAGNOSIS — E669 Obesity, unspecified: Secondary | ICD-10-CM | POA: Insufficient documentation

## 2013-10-23 DIAGNOSIS — F17200 Nicotine dependence, unspecified, uncomplicated: Secondary | ICD-10-CM | POA: Insufficient documentation

## 2013-10-23 DIAGNOSIS — I252 Old myocardial infarction: Secondary | ICD-10-CM | POA: Insufficient documentation

## 2013-10-23 DIAGNOSIS — Z79899 Other long term (current) drug therapy: Secondary | ICD-10-CM | POA: Insufficient documentation

## 2013-10-23 DIAGNOSIS — E119 Type 2 diabetes mellitus without complications: Secondary | ICD-10-CM | POA: Insufficient documentation

## 2013-10-23 LAB — URINALYSIS, ROUTINE W REFLEX MICROSCOPIC
Glucose, UA: 100 mg/dL — AB
Ketones, ur: 15 mg/dL — AB
Nitrite: POSITIVE — AB
Protein, ur: 30 mg/dL — AB
SPECIFIC GRAVITY, URINE: 1.026 (ref 1.005–1.030)
UROBILINOGEN UA: 1 mg/dL (ref 0.0–1.0)
pH: 5.5 (ref 5.0–8.0)

## 2013-10-23 LAB — CBC WITH DIFFERENTIAL/PLATELET
BASOS ABS: 0 10*3/uL (ref 0.0–0.1)
Basophils Relative: 0 % (ref 0–1)
Eosinophils Absolute: 0.1 10*3/uL (ref 0.0–0.7)
Eosinophils Relative: 1 % (ref 0–5)
HCT: 41.9 % (ref 39.0–52.0)
Hemoglobin: 14.8 g/dL (ref 13.0–17.0)
LYMPHS PCT: 15 % (ref 12–46)
Lymphs Abs: 1.4 10*3/uL (ref 0.7–4.0)
MCH: 32.2 pg (ref 26.0–34.0)
MCHC: 35.3 g/dL (ref 30.0–36.0)
MCV: 91.1 fL (ref 78.0–100.0)
Monocytes Absolute: 1.5 10*3/uL — ABNORMAL HIGH (ref 0.1–1.0)
Monocytes Relative: 15 % — ABNORMAL HIGH (ref 3–12)
NEUTROS ABS: 6.8 10*3/uL (ref 1.7–7.7)
NEUTROS PCT: 69 % (ref 43–77)
Platelets: 162 10*3/uL (ref 150–400)
RBC: 4.6 MIL/uL (ref 4.22–5.81)
RDW: 12.2 % (ref 11.5–15.5)
WBC: 9.9 10*3/uL (ref 4.0–10.5)

## 2013-10-23 LAB — BASIC METABOLIC PANEL
ANION GAP: 15 (ref 5–15)
BUN: 12 mg/dL (ref 6–23)
CHLORIDE: 94 meq/L — AB (ref 96–112)
CO2: 23 meq/L (ref 19–32)
Calcium: 9.7 mg/dL (ref 8.4–10.5)
Creatinine, Ser: 0.85 mg/dL (ref 0.50–1.35)
GFR calc non Af Amer: >90 mL/min (ref >90)
Glucose, Bld: 141 mg/dL — ABNORMAL HIGH (ref 70–99)
POTASSIUM: 3.9 meq/L (ref 3.7–5.3)
Sodium: 132 mEq/L — ABNORMAL LOW (ref 137–147)

## 2013-10-23 LAB — URINE MICROSCOPIC-ADD ON

## 2013-10-23 LAB — CBG MONITORING, ED: GLUCOSE-CAPILLARY: 127 mg/dL — AB (ref 70–99)

## 2013-10-23 MED ORDER — LIDOCAINE HCL 1 % IJ SOLN
INTRAMUSCULAR | Status: AC
  Administered 2013-10-23: 2.1 mL
  Filled 2013-10-23: qty 20

## 2013-10-23 MED ORDER — CEPHALEXIN 500 MG PO CAPS: 500.0000 mg | ORAL_CAPSULE | Freq: Three times a day (TID) | ORAL | Status: DC

## 2013-10-23 MED ORDER — CEFTRIAXONE SODIUM 1 G IJ SOLR
1.0000 g | Freq: Once | INTRAMUSCULAR | Status: AC
  Administered 2013-10-23: 1 g via INTRAMUSCULAR
  Filled 2013-10-23: qty 10

## 2013-10-23 NOTE — ED Provider Notes (Signed)
Medical screening examination/treatment/procedure(s) were performed by non-physician practitioner and as supervising physician I was immediately available for consultation/collaboration.   EKG Interpretation None        Hoy Morn, MD 10/23/13 [phone]

## 2013-10-23 NOTE — ED Provider Notes (Signed)
CSN: [id number]     Arrival date & time 10/23/13  1000 History   First MD Initiated Contact with Patient 10/23/13 1028     Chief Complaint  Patient presents with  . Urinary Frequency  . Malodorous urine      (Consider location/radiation/quality/duration/timing/severity/associated sxs/prior Treatment) Patient is a 53 y.o. male presenting with frequency. The history is provided by the patient and medical records.  Urinary Frequency  This is a 53 year old male with past medical history significant for diabetes, obesity, coronary artery disease, urinary retention, presenting to the ED for UTI type symptoms.  Patient states for the past 2 days he has been experiencing urinary frequency with decreased output and strong odor and discoloration to his urine.  Patient has issues with urinary retention at baseline and performs in-and-out caths on a daily basis.  Denies fever but endorses some chills upon waking this morning.  Denies abdominal pain, nausea, vomiting, diarrhea.  BMs regular.  Patient is not currently followed by urology or PCP at this time.  Hx of frequent UTI's in the past with similar sx.  Patient not currently on meds for his DM, has not checked blood sugar in the past several days.  Denies excessive thirst.  Past Medical History  Diagnosis Date  . CAD, NATIVE VESSEL 10/29/2009  . 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 OBESITY 10/22/2009  . Diabetes mellitus without complication    Past Surgical History  Procedure Laterality Date  . Cystoscopy w/ ureteral stent placement  02/26/2012    Procedure: CYSTOSCOPY WITH RETROGRADE PYELOGRAM/URETERAL STENT PLACEMENT;  Surgeon: Bernestine Amass, MD;  Location: [facility];  Service: Urology;  Laterality: Left;  . Coronary stent placement     Family History  Problem Relation Age of Onset  . Diabetes Father   . Coronary artery disease Other   . Heart attack Other    History  Substance Use Topics  . Smoking status: Current Every Day Smoker  . Smokeless  tobacco: Not on file  . Alcohol Use: No    Review of Systems  Genitourinary: Positive for frequency.       Urinary odor and discoloration  All other systems reviewed and are negative.     Allergies  Review of patient's allergies indicates no known allergies.  Home Medications   Prior to Admission medications   Medication Sig Start Date End Date Taking? Authorizing Provider  aspirin EC 81 MG tablet Take 81 mg by mouth daily.   Yes Historical Provider, MD  lisinopril (PRINIVIL,ZESTRIL) 2.5 MG tablet Take 1 tablet (2.5 mg total) by mouth daily. 09/08/13  Yes Orpah Greek, MD  metoprolol (LOPRESSOR) 25 MG tablet Take 0.5 tablets (12.5 mg total) by mouth 2 (two) times daily. 09/08/13  Yes Orpah Greek, MD  pravastatin (PRAVACHOL) 20 MG tablet Take 1 tablet (20 mg total) by mouth daily. 09/08/13  Yes Orpah Greek, MD   BP 120/83  Pulse 106  Temp(Src) 98.4 F (36.9 C) (Oral)  Resp 16  SpO2 97%  Physical Exam  Nursing note and vitals reviewed. Constitutional: He is oriented to person, place, and time. He appears well-developed and well-nourished. No distress.  HENT:  Head: Normocephalic and atraumatic.  Mouth/Throat: Oropharynx is clear and moist.  Eyes: Conjunctivae and EOM are normal. Pupils are equal, round, and reactive to light.  Neck: Normal range of motion. Neck supple.  Cardiovascular: Normal rate, regular rhythm and normal heart sounds.   Pulmonary/Chest: Effort normal and breath sounds normal. No respiratory  distress. He has no wheezes.  Abdominal: Soft. Bowel sounds are normal. There is no tenderness. There is no guarding and no CVA tenderness.  Abdomen soft, non-distended, no peritoneal signs  Musculoskeletal: Normal range of motion.  Neurological: He is alert and oriented to person, place, and time.  Skin: Skin is warm and dry. He is not diaphoretic.  Psychiatric: He has a normal mood and affect.    ED Course  Procedures (including  critical care time) Labs Review Labs Reviewed  CBC WITH DIFFERENTIAL - Abnormal; Notable for the following:    Monocytes Relative 15 (*)    Monocytes Absolute 1.5 (*)    All other components within normal limits  BASIC METABOLIC PANEL - Abnormal; Notable for the following:    Sodium 132 (*)    Chloride 94 (*)    Glucose, Bld 141 (*)    All other components within normal limits  URINALYSIS, ROUTINE W REFLEX MICROSCOPIC - Abnormal; Notable for the following:    Color, Urine AMBER (*)    APPearance CLOUDY (*)    Glucose, UA 100 (*)    Hgb urine dipstick TRACE (*)    Bilirubin Urine SMALL (*)    Ketones, ur 15 (*)    Protein, ur 30 (*)    Nitrite POSITIVE (*)    Leukocytes, UA MODERATE (*)    All other components within normal limits  URINE MICROSCOPIC-ADD ON - Abnormal; Notable for the following:    Bacteria, UA MANY (*)    All other components within normal limits  CBG MONITORING, ED - Abnormal; Notable for the following:    Glucose-Capillary 127 (*)    All other components within normal limits  URINE CULTURE    Imaging Review No results found.   EKG Interpretation None      MDM   Final diagnoses:  UTI (lower urinary tract infection)   53 year old male with UTI symptoms-- frequency, foul odor, discoloration. He has issues with urinary retention at baseline and self caths daily. On exam, patient is afebrile and nontoxic-appearing.  His abdominal exam is benign, no CVA tenderness.  Labs were obtained which were reassuring, no leukocytosis, renal function WNL, anion gap normal at 15.  CBG 127.  U/a nitrite +, culture pending.  Patient given 1 g Rocephin in the ED, will discharge home with Keflex. I recommended that he followup with a urologist, referral provided.  Discussed plan with patient, he/she acknowledged understanding and agreed with plan of care.  Return precautions given for new or worsening symptoms.  [name], PA-C 10/23/13 1359

## 2013-10-23 NOTE — Discharge Instructions (Signed)
Take the prescribed medication as directed. Follow-up with [facility]. Return to the ED for new or worsening symptoms-- high fever, no urine output, chills, confusion, etc.

## 2013-10-23 NOTE — Progress Notes (Signed)
[facility] Community Liaison Stacy,  ° °Provided pt with a list of primary care resources and a [facility] Orange Card application to help patient establish primary care.  °

## 2013-10-23 NOTE — ED Notes (Addendum)
Pt c/o urinary frequency, decreased output, and malordorous urine x 2 days.  Denies pain.  Pt reports that he has to in and out cath himself.  Hx of DM.  Pt reports that he does not take medications for his DM and does not check his CBG regularly.  Pt does not have PCP or urologist.

## 2013-10-23 NOTE — ED Notes (Signed)
He states his urine has been malodorous since this Mon.  He further states he has been performing self-caths prn since spine surgery in 1999.  He also states his urine has been "real yellow".  He c/o urinary frequency and is in no distress.  He states it has not been necessary for him to have seen a urologist thus far.

## 2013-10-25 LAB — URINE CULTURE

## 2013-10-27 ENCOUNTER — Telehealth (HOSPITAL_BASED_OUTPATIENT_CLINIC_OR_DEPARTMENT_OTHER): Payer: Self-pay

## 2013-10-27 NOTE — Telephone Encounter (Signed)
Post ED Visit - Positive Culture Follow-up  Culture report reviewed by antimicrobial stewardship pharmacist: [x]  Wes [place], Pharm.D., BCPS []  Heide Guile, Pharm.D., BCPS []  Alycia Rossetti, Pharm.D., BCPS []  [place], [place].D., BCPS, AAHIVP []  Legrand Como, Pharm.D., BCPS, AAHIVP []  Carly Sabat, Pharm.D. []  Elenor Quinones, Pharm.D.  Positive Urine culture Treated with Cephalexin, organism sensitive to the same and no further patient follow-up is required at this time.  Dortha Kern 10/27/2013, 5:30 AM

## 2013-11-11 ENCOUNTER — Emergency Department (HOSPITAL_COMMUNITY)
Admission: EM | Admit: 2013-11-11 | Discharge: 2013-11-11 | Disposition: A | Discharge status: Home / Self Care | Payer: Self-pay | Attending: Emergency Medicine | Admitting: Emergency Medicine

## 2013-11-11 ENCOUNTER — Encounter (HOSPITAL_COMMUNITY): Payer: Self-pay | Admitting: Emergency Medicine

## 2013-11-11 DIAGNOSIS — I2510 Atherosclerotic heart disease of native coronary artery without angina pectoris: Secondary | ICD-10-CM | POA: Insufficient documentation

## 2013-11-11 DIAGNOSIS — Z9861 Coronary angioplasty status: Secondary | ICD-10-CM | POA: Insufficient documentation

## 2013-11-11 DIAGNOSIS — Z7982 Long term (current) use of aspirin: Secondary | ICD-10-CM | POA: Insufficient documentation

## 2013-11-11 DIAGNOSIS — E119 Type 2 diabetes mellitus without complications: Secondary | ICD-10-CM | POA: Insufficient documentation

## 2013-11-11 DIAGNOSIS — F17200 Nicotine dependence, unspecified, uncomplicated: Secondary | ICD-10-CM | POA: Insufficient documentation

## 2013-11-11 DIAGNOSIS — I252 Old myocardial infarction: Secondary | ICD-10-CM | POA: Insufficient documentation

## 2013-11-11 DIAGNOSIS — N390 Urinary tract infection, site not specified: Secondary | ICD-10-CM | POA: Insufficient documentation

## 2013-11-11 DIAGNOSIS — R350 Frequency of micturition: Secondary | ICD-10-CM | POA: Insufficient documentation

## 2013-11-11 DIAGNOSIS — E669 Obesity, unspecified: Secondary | ICD-10-CM | POA: Insufficient documentation

## 2013-11-11 LAB — CBC WITH DIFFERENTIAL/PLATELET
BASOS ABS: 0 10*3/uL (ref 0.0–0.1)
Basophils Relative: 0 % (ref 0–1)
EOS ABS: 0.1 10*3/uL (ref 0.0–0.7)
Eosinophils Relative: 1 % (ref 0–5)
HCT: 40.5 % (ref 39.0–52.0)
Hemoglobin: 14 g/dL (ref 13.0–17.0)
LYMPHS PCT: 16 % (ref 12–46)
Lymphs Abs: 1.8 10*3/uL (ref 0.7–4.0)
MCH: 31.3 pg (ref 26.0–34.0)
MCHC: 34.6 g/dL (ref 30.0–36.0)
MCV: 90.6 fL (ref 78.0–100.0)
Monocytes Absolute: 1.1 10*3/uL — ABNORMAL HIGH (ref 0.1–1.0)
Monocytes Relative: 10 % (ref 3–12)
Neutro Abs: 8 10*3/uL — ABNORMAL HIGH (ref 1.7–7.7)
Neutrophils Relative %: 73 % (ref 43–77)
Platelets: 182 10*3/uL (ref 150–400)
RBC: 4.47 MIL/uL (ref 4.22–5.81)
RDW: 12.4 % (ref 11.5–15.5)
WBC: 11 10*3/uL — AB (ref 4.0–10.5)

## 2013-11-11 LAB — URINALYSIS, ROUTINE W REFLEX MICROSCOPIC
Bilirubin Urine: NEGATIVE
Glucose, UA: NEGATIVE mg/dL
Ketones, ur: NEGATIVE mg/dL
Nitrite: POSITIVE — AB
Protein, ur: NEGATIVE mg/dL
Specific Gravity, Urine: 1.025 (ref 1.005–1.030)
Urobilinogen, UA: 0.2 mg/dL (ref 0.0–1.0)
pH: 5.5 (ref 5.0–8.0)

## 2013-11-11 LAB — BASIC METABOLIC PANEL
ANION GAP: 15 (ref 5–15)
BUN: 12 mg/dL (ref 6–23)
CALCIUM: 9.4 mg/dL (ref 8.4–10.5)
CO2: 20 mEq/L (ref 19–32)
Chloride: 99 mEq/L (ref 96–112)
Creatinine, Ser: 0.82 mg/dL (ref 0.50–1.35)
Glucose, Bld: 152 mg/dL — ABNORMAL HIGH (ref 70–99)
Potassium: 4 mEq/L (ref 3.7–5.3)
SODIUM: 134 meq/L — AB (ref 137–147)

## 2013-11-11 LAB — URINE MICROSCOPIC-ADD ON

## 2013-11-11 LAB — I-STAT CG4 LACTIC ACID, ED: LACTIC ACID, VENOUS: 2.07 mmol/L (ref 0.5–2.2)

## 2013-11-11 MED ORDER — CEPHALEXIN 500 MG PO CAPS: 500.0000 mg | ORAL_CAPSULE | Freq: Four times a day (QID) | ORAL | Status: DC

## 2013-11-11 MED ORDER — CEFTRIAXONE SODIUM 1 G IJ SOLR
1.0000 g | Freq: Once | INTRAMUSCULAR | Status: AC
  Administered 2013-11-11: 1 g via INTRAMUSCULAR
  Filled 2013-11-11: qty 10

## 2013-11-11 MED ORDER — LIDOCAINE HCL 1 % IJ SOLN
INTRAMUSCULAR | Status: AC
  Administered 2013-11-11: 20 mL
  Filled 2013-11-11: qty 20

## 2013-11-11 NOTE — ED Notes (Signed)
Pt self-cath'd to provide urine specimen.

## 2013-11-11 NOTE — ED Notes (Addendum)
Pt reports that during wait, he went outside and self cath'd.  Pt did not collect a specimen.

## 2013-11-11 NOTE — Progress Notes (Signed)
  CARE MANAGEMENT ED NOTE 11/11/2013  Patient:  Shane Bond, Shane Bond   Account Number:  [id number]  Date Initiated:  11/11/2013  Documentation initiated by:  Livia Snellen  Subjective/Objective Assessment:   Patient presents to Ed with frequency and burning upon urination.     Subjective/Objective Assessment Detail:   Patient with pmhx of CAD, AMI,DM, obesity, cystoscopy with ureteral stent placement.     Action/Plan:   Patient to be discharged home with ABX   Action/Plan Detail:   Anticipated DC Date:  11/11/2013     Status Recommendation to Physician:   Result of Recommendation:    Other ED [place]  Other  PCP issues    Choice offered to / List presented to:            Status of service:  Completed, signed off  ED Comments:   ED Comments Detail:  EDCM spoke to patient at bedside.  Patient noted to have no pcp or insurance.  [facility] provided patient with list of pcps who accept self pay patients, list of discount pharmacies and websites needymeds.org and Good https://www.henry-hernandez.biz/ for medication assistance, phone number to inquire about the orange card, DSS for Medicaid and [place] for insurnace, list of financial resources in the community such as local churches and [employer], and dental assistance for uninsured patients.  EDCM also provided patient with pamphlet to [facility].  EDCM instructed patient to call tomorrow to [facility] to make an appointment for ED follow up.  EDCM explained to patient clinic walk in hours, he may establish care there, speak to financial counselor, social worker and receive assistance with his medications.  [facility] informed patient that Keflex is listed as free medication at [facility] [employer].  Patient thankful for resources.  No further EDCM needs at this time.

## 2013-11-11 NOTE — ED Provider Notes (Signed)
CSN: [id number]     Arrival date & time 11/11/13  1147 History   First MD Initiated Contact with Patient 11/11/13 1749     Chief Complaint  Patient presents with  . Urinary Frequency  . Weakness     HPI Pt was seen at 1755. Per pt, c/o gradual onset and persistence of constant dysuria, urinary frequency and "foul smelling urine" for the past 2 days. Pt states he alternates self catheterizing and urinating on his own.  Denies flank pain, no fevers, no abd pain, no N/V/D, no testicular pain/swelling, no rash.   Uro: Dr. Risa Grill Past Medical History  Diagnosis Date  . CAD, NATIVE VESSEL 10/29/2009  . 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 OBESITY 10/22/2009  . Diabetes mellitus without complication    Past Surgical History  Procedure Laterality Date  . Cystoscopy w/ ureteral stent placement  02/26/2012    Procedure: CYSTOSCOPY WITH RETROGRADE PYELOGRAM/URETERAL STENT PLACEMENT;  Surgeon: Bernestine Amass, MD;  Location: [facility];  Service: Urology;  Laterality: Left;  . Coronary stent placement     Family History  Problem Relation Age of Onset  . Diabetes Father   . Coronary artery disease Other   . Heart attack Other    History  Substance Use Topics  . Smoking status: Current Every Day Smoker  . Smokeless tobacco: Not on file  . Alcohol Use: No    Review of Systems ROS: Statement: All systems negative except as marked or noted in the HPI; Constitutional: Negative for fever and chills. ; ; Eyes: Negative for eye pain, redness and discharge. ; ; ENMT: Negative for ear pain, hoarseness, nasal congestion, sinus pressure and sore throat. ; ; Cardiovascular: Negative for chest pain, palpitations, diaphoresis, dyspnea and peripheral edema. ; ; Respiratory: Negative for cough, wheezing and stridor. ; ; Gastrointestinal: Negative for nausea, vomiting, diarrhea, abdominal pain, blood in stool, hematemesis, jaundice and rectal bleeding. . ; ; Genitourinary: +dysuria, urinary frequency, "foul smelling  urine." Negative for flank pain and hematuria. ; ; Genital:  No penile drainage or rash, no testicular pain or swelling, no scrotal rash or swelling. ;; Musculoskeletal: Negative for back pain and neck pain. Negative for swelling and trauma.; ; Skin: Negative for pruritus, rash, abrasions, blisters, bruising and skin lesion.; ; Neuro: Negative for headache, lightheadedness and neck stiffness. Negative for weakness, altered level of consciousness , altered mental status, extremity weakness, paresthesias, involuntary movement, seizure and syncope.      Allergies  Review of patient's allergies indicates no known allergies.  Home Medications   Prior to Admission medications   Medication Sig Start Date End Date Taking? Authorizing Provider  aspirin EC 81 MG tablet Take 81 mg by mouth daily.   Yes Historical Provider, MD   BP 146/90  Pulse 88  Temp(Src) 98.5 F (36.9 C) (Oral)  Resp 16  SpO2 100% Physical Exam 1800: Physical examination:  Nursing notes reviewed; Vital signs and O2 SAT reviewed;  Constitutional: Well developed, Well nourished, Well hydrated, In no acute distress; Head:  Normocephalic, atraumatic; Eyes: EOMI, PERRL, No scleral icterus; ENMT: Mouth and pharynx normal, Mucous membranes moist; Neck: Supple, Full range of motion, No lymphadenopathy; Cardiovascular: Regular rate and rhythm, No murmur, rub, or gallop; Respiratory: Breath sounds clear & equal bilaterally, No rales, rhonchi, wheezes.  Speaking full sentences with ease, Normal respiratory effort/excursion; Chest: Nontender, Movement normal; Abdomen: Soft, Nontender, Nondistended, Normal bowel sounds; Genitourinary: No CVA tenderness; Extremities: Pulses normal, No tenderness, No edema, No calf edema  or asymmetry.; Neuro: AA&Ox3, Major CN grossly intact.  Speech clear. No gross focal motor or sensory deficits in extremities. Climbs on and off stretcher easily by himself. Gait steady.; Skin: Color normal, Warm, Dry.   ED Course   Procedures     MDM  MDM Reviewed: previous chart, nursing note and vitals Reviewed previous: labs Interpretation: labs    Results for orders placed during the hospital encounter of 11/11/13  URINALYSIS, ROUTINE W REFLEX MICROSCOPIC      Result Value Ref Range   Color, Urine AMBER (*) YELLOW   APPearance CLOUDY (*) CLEAR   Specific Gravity, Urine 1.025  1.005 - 1.030   pH 5.5  5.0 - 8.0   Glucose, UA NEGATIVE  NEGATIVE mg/dL   Hgb urine dipstick TRACE (*) NEGATIVE   Bilirubin Urine NEGATIVE  NEGATIVE   Ketones, ur NEGATIVE  NEGATIVE mg/dL   Protein, ur NEGATIVE  NEGATIVE mg/dL   Urobilinogen, UA 0.2  0.0 - 1.0 mg/dL   Nitrite POSITIVE (*) NEGATIVE   Leukocytes, UA MODERATE (*) NEGATIVE  CBC WITH DIFFERENTIAL      Result Value Ref Range   WBC 11.0 (*) 4.0 - 10.5 K/uL   RBC 4.47  4.22 - 5.81 MIL/uL   Hemoglobin 14.0  13.0 - 17.0 g/dL   HCT 40.5  39.0 - 52.0 %   MCV 90.6  78.0 - 100.0 fL   MCH 31.3  26.0 - 34.0 pg   MCHC 34.6  30.0 - 36.0 g/dL   RDW 12.4  11.5 - 15.5 %   Platelets 182  150 - 400 K/uL   Neutrophils Relative % 73  43 - 77 %   Neutro Abs 8.0 (*) 1.7 - 7.7 K/uL   Lymphocytes Relative 16  12 - 46 %   Lymphs Abs 1.8  0.7 - 4.0 K/uL   Monocytes Relative 10  3 - 12 %   Monocytes Absolute 1.1 (*) 0.1 - 1.0 K/uL   Eosinophils Relative 1  0 - 5 %   Eosinophils Absolute 0.1  0.0 - 0.7 K/uL   Basophils Relative 0  0 - 1 %   Basophils Absolute 0.0  0.0 - 0.1 K/uL  BASIC METABOLIC PANEL      Result Value Ref Range   Sodium 134 (*) 137 - 147 mEq/L   Potassium 4.0  3.7 - 5.3 mEq/L   Chloride 99  96 - 112 mEq/L   CO2 20  19 - 32 mEq/L   Glucose, Bld 152 (*) 70 - 99 mg/dL   BUN 12  6 - 23 mg/dL   Creatinine, Ser 0.82  0.50 - 1.35 mg/dL   Calcium 9.4  8.4 - 10.5 mg/dL   GFR calc non Af Amer >90  >90 mL/min   GFR calc Af Amer >90  >90 mL/min   Anion gap 15  5 - 15  URINE MICROSCOPIC-ADD ON      Result Value Ref Range   WBC, UA 21-50  <3 WBC/hpf   RBC / HPF  0-2  <3 RBC/hpf  I-STAT CG4 LACTIC ACID, ED      Result Value Ref Range   Lactic Acid, Venous 2.07  0.5 - 2.2 mmol/L     2000:  +UTI, UC pending. Will tx IM rocephin, PO keflex. Pt is not orthostatic. Has tol PO well while in the ED without N/V. Pt has ambulated with steady gait. Pt states he "wants to go home now." Dx and testing d/w pt.  Questions answered.  Verb understanding, agreeable to d/c home with outpt f/u.     Francine Graven, DO 11/13/13 1805

## 2013-11-11 NOTE — ED Notes (Signed)
Pt c/o frequent urination and burning since yesterday. pt also c/o being feeling weak.

## 2013-11-11 NOTE — Discharge Instructions (Signed)
°Emergency Department Resource Guide °1) Find a Doctor and Pay Out of Pocket °Although you won't have to find out who is covered by your insurance plan, it is a good idea to ask around and get recommendations. You will then need to call the office and see if the doctor you have chosen will accept you as a new patient and what types of options they offer for patients who are self-pay. Some doctors offer discounts or will set up payment plans for their patients who do not have insurance, but you will need to ask so you aren't surprised when you get to your appointment. ° °2) Contact Your Local Health Department °Not all health departments have doctors that can see patients for sick visits, but many do, so it is worth a call to see if yours does. If you don't know where your local health department is, you can check in your phone book. The CDC also has a tool to help you locate your state's health department, and many state websites also have listings of all of their local health departments. ° °3) Find a Walk-in Clinic °If your illness is not likely to be very severe or complicated, you may want to try a walk in clinic. These are popping up all over the country in pharmacies, drugstores, and shopping centers. They're usually staffed by nurse practitioners or physician assistants that have been trained to treat common illnesses and complaints. They're usually fairly quick and inexpensive. However, if you have serious medical issues or chronic medical problems, these are probably not your best option. ° °No Primary Care Doctor: °- Call [facility] at  832-8000 - they can help you locate a primary care doctor that  accepts your insurance, provides certain services, etc. °- Physician Referral Service- 1-800-533-3463 ° °Chronic Pain Problems: °Organization         Address  Phone   Notes  °[facility]  (336) 297-2271 Patients need to be referred by their primary care doctor.  ° °Medication  Assistance: °Organization         Address  Phone   Notes  °[place] [facility] [address] °[place], [place] [address] (336) 641-8030 --Must be a resident of [place] °-- Must have NO insurance coverage whatsoever (no Medicaid/ Medicare, etc.) °-- The pt. MUST have a primary care doctor that directs their care regularly and follows them in the community °  °[facility]  (866) 331-1348   °[employer]  (888) 892-1162   ° °Agencies that provide inexpensive medical care: °Organization         Address  Phone   Notes  °[facility]  (336) 832-8035   °[facility]    (336) 832-7272   °[facility] [address] °[place], [place] [address] (336) 832-4777   °[facility] [address], °[place] (336) 271-4999   °[facility]    (336) 373-0678   °[facility]    (336) 272-1050   °[facility] ° [address], [place] Phone:  (336) 832-4444, Fax:  (336) 832-4440 Hours of Operation:  9 am - 6 pm, M-F.  Also accepts Medicaid/Medicare and self-pay.  °[facility] ° [address], [place] Phone: (336) 832-3150, Fax: (336) [phone]. Hours of Operation:  8:30 am - 5:30 pm, M-F.  Also accepts Medicaid and self-pay.  °[facility] [place] [address], [place] Phone: (336) 878-6027   °[facility] [address], [place], [place] (336)723-1848, Ext. 123 Mondays & Thursdays: 7-9 AM.  First 15 patients are seen on a first come, first serve basis. °  ° °Medicaid-accepting [place] Providers: ° °Organization         Address  Phone   Notes  °[facility] [address], [place] (336) 641-2100 Also accepts self-pay patients.  °[facility] [address], [place] ° (336) 856-9996   °[facility] [address], [place]  (336) 288-8857   °[facility] [address] [place] [address], [place] (336) 299-7000   °Veita Bland [address], [place]  ° (336) 373-1557 Only accepts [place] Access Medicaid patients after they have their name applied to their card.  ° °Self-Pay (no insurance) in [place]: ° °Organization         Address  Phone   Notes  °Sickle Cell Patients, [facility] [address], [place] (336) 832-1970   °[facility] [address], [place] (336) 832-4400   °[facility] ° [address], [place] (336) 992-4800   °[facility]/Dr. Osei-Bonsu ° [address] [place] [address], [place] or [address], [place] (336) 841-8500 Phone number for both [place] and [place] locations is the same.  °[facility] [address], [place] (336) 299-0000   °[facility] [address] [place] [address], [place] or [address] (336) 852-7530 °(336) 878-2260   °[facility] [address], [place] (336) 350-1642, phone; (336) 294-5005, fax Sees patients 1st and 3rd Saturday of every month.  Must not qualify for public or private insurance (i.e. Medicaid, Medicare, [place] Health Choice, Veterans' Benefits) • Household income should be no more than 200% of the poverty level •The clinic cannot treat you if you are pregnant or think you are pregnant • Sexually transmitted diseases are not treated at the clinic.  ° ° °Dental Care: °Organization         Address  Phone  Notes  °[place] [facility] [address], [place] (336) 641-6152 Accepts children up to age 21 who are enrolled in Medicaid or [place] Health Choice; pregnant women with a Medicaid card; and children who have applied for Medicaid or [place] Health Choice, but were declined, whose parents can pay a reduced fee at time of service.  °[place]  [facility] [place]  [address], [place] (336) 641-7733 Accepts children up to age 21 who are enrolled in Medicaid or [place] Health Choice; pregnant women with a Medicaid card; and children who have applied for Medicaid or  Health Choice, but were declined, whose parents can pay a reduced fee at time of service.  °[facility] ° [address],  (336) 641-4533 Patients are seen by appointment only. Walk-ins are not accepted. [facility] will see patients 18 years of age and older. °Monday - Tuesday (8am-5pm) °Most Wednesdays (8:30-5pm) °$30 per visit, cash only  °[facility] ° [address], [place] (336) 641-4533 Patients are seen by appointment only. Walk-ins are not accepted. [facility] will see patients 18 years of age and older. °One   Wednesday Evening (Monthly: Volunteer Based).  $30 per visit, cash only  °[facility]  (919) 537-3737 for adults; Children under age 4, call Graduate Pediatric Dentistry at (919) [phone]. Children aged 4-14, please call (919) [phone] to request a pediatric application. ° Dental services are provided in all areas of dental care including fillings, crowns and bridges, complete and partial dentures, implants, gum treatment, root canals, and extractions. Preventive care is also provided. Treatment is provided to both adults and children. °Patients are selected via a lottery and there is often a waiting list. °  °[facility] [address], °[place] ° (336) 763-8833 [email or web address] °  °[facility] [address], [place], [place] (336)723-1848, Ext. 123 Second and Fourth Thursday of each month, opens at 6:30 AM; Clinic ends at 9 AM.  Patients are seen on a first-come first-served basis, and a limited number are seen during each clinic.  ° °[facility] ° [address], [place], [place] (336) 723-7904    Eligibility Requirements °You must have lived in [place], or [place] counties for at least the last three months. °  You cannot be eligible for state or federal sponsored healthcare insurance, including Veterans Administration, Medicaid, or Medicare. °  You generally cannot be eligible for healthcare insurance through your employer.  °  How to apply: °Eligibility screenings are held every Tuesday and Wednesday afternoon from 1:00 pm until 4:00 pm. You do not need an appointment for the interview!  °[facility] [address], [place], [place] 336-631-2330   °[facility]  336-342-8273   °[facility]  336-703-3100   °[facility]  336-570-6415   ° °Behavioral Health Resources in the Community: °Intensive Outpatient Programs °Organization         Address  Phone  Notes  °[facility] [address], [place], [place] 336-878-6098   °[facility] [address], [place], [place] 336-832-9800   °[facility] [address], [place], [place] ° 336-882-2125   °[facility] [address],  °[place], [place] 1-800-853-5163 or 336-641-4981   °Substance Abuse Resources °Organization         Address  Phone  Notes  °[facility]  336-882-2125   °[facility]  336-784-9470   °[facility]  336-285-9073   °[facility]  336-845-3988   °Residential & Outpatient Substance Abuse Program  1-800-659-3381   °Psychological Services °Organization         Address  Phone  Notes  °[facility]  336- 832-9600   °[facility]  336- 378-7881   °[facility] [address], [place] 1-800-853-5163 or 336-641-4981   ° °Mobile Crisis Teams °Organization         Address  Phone  Notes  °[facility], Mobile Crisis Care Unit  1-877-626-1772   °Assertive °[facility] ° [address]  [place], [place] 336-834-9664   °Sharon DeEsch [address] °[place]  336-554-5454   ° °Self-Help/Support Groups °Organization         Address  Phone             Notes  °[facility] [place] - variety of support groups  336- 373-1402 Call for more information  °Narcotics Anonymous (NA), [facility] [address], °[place]   2 meetings at this location  ° °  Residential Treatment Programs Organization         Address  Phone  Notes  [facility] [address]    [place]  1-(336)695-7064   [facility]  [address] [place] [address], [place], [place]   [facility] [place], [place] 310-434-6288 Admissions: 8am-3pm M-F  [facility] [place] [address],    [place], [place] [phone]   [facility] [address] [place], [place], [place]   [facility] ([place]) [address],  [place], [place]   [facility] - Intensive Outpatient [address], [place], [place]   [facility] ([place] [address]  [place], [place] 1-224-536-3953 or (662) 399-3107   [facility] [address], [place], [place] Accepts Medicaid  [facility] [address]  [place] 1-206-756-1406 Substance Abuse/Addiction Treatment   [facility]         Address  Phone  Notes  [facility]  [phone]   Domenic Schwab, PhD [address] Arlis Porta [place], [place]   830 463 2833 or 573-486-8351   [facility] [place], [place] (985) 167-6684   [facility] [address], [place], [place] (831)112-6125 Insurance/Medicaid/sponsorship through [facility] and Families [address], [address] [place]                                    [place], [place] [phone] [place] [address] [place] [address], [place] [phone]    Dr. Adele Schilder  [phone]   [facility] Dept. 1) [address], [place] 2) [place] 3)  [address], [place] [phone] [phone]  (430) [phone]   [place] 6603968990 or ([phone] (After Hours)       Take the prescription as directed.  Call your regular Urologist tomorrow to schedule a follow up appointment within the next 2 days.  Return to the Emergency Department immediately sooner if worsening.

## 2013-11-11 NOTE — ED Notes (Signed)
Patient has to self cath to get urine specimen, patient also states that he has been having some prostate issues. Patient will cath when in a room.

## 2013-11-13 LAB — URINE CULTURE: Colony Count: >=100000

## 2013-11-13 LAB — CBG MONITORING, ED: Glucose-Capillary: 127 mg/dL — ABNORMAL HIGH (ref 70–99)

## 2013-11-14 ENCOUNTER — Telehealth (HOSPITAL_COMMUNITY): Payer: Self-pay

## 2013-11-14 NOTE — ED Notes (Signed)
Post ED Visit - Positive Culture Follow-up  Culture report reviewed by antimicrobial stewardship pharmacist: []  Wes Dulaney, Pharm.D., BCPS []  Heide Guile, Pharm.D., BCPS []  Alycia Rossetti, Pharm.D., BCPS []  [place], Pharm.D., BCPS, AAHIVP []  Legrand Como, Pharm.D., BCPS, AAHIVP []  Carly Sabat, Pharm.D. [x]  Elenor Quinones, Pharm.D.  Positive urine culture Treated with cephalexin, organism sensitive to the same and no further patient follow-up is required at this time.  Ileene Musa 11/14/2013, 12:42 PM

## 2014-11-21 ENCOUNTER — Emergency Department (HOSPITAL_COMMUNITY)
Admission: EM | Admit: 2014-11-21 | Discharge: 2014-11-21 | Disposition: A | Discharge status: Home / Self Care | Payer: Self-pay | Attending: Emergency Medicine | Admitting: Emergency Medicine

## 2014-11-21 ENCOUNTER — Encounter (HOSPITAL_COMMUNITY): Payer: Self-pay | Admitting: Emergency Medicine

## 2014-11-21 DIAGNOSIS — E1165 Type 2 diabetes mellitus with hyperglycemia: Secondary | ICD-10-CM | POA: Insufficient documentation

## 2014-11-21 DIAGNOSIS — R300 Dysuria: Secondary | ICD-10-CM | POA: Insufficient documentation

## 2014-11-21 DIAGNOSIS — Z7982 Long term (current) use of aspirin: Secondary | ICD-10-CM | POA: Insufficient documentation

## 2014-11-21 DIAGNOSIS — Z720 Tobacco use: Secondary | ICD-10-CM | POA: Insufficient documentation

## 2014-11-21 DIAGNOSIS — Z792 Long term (current) use of antibiotics: Secondary | ICD-10-CM | POA: Insufficient documentation

## 2014-11-21 DIAGNOSIS — R8299 Other abnormal findings in urine: Secondary | ICD-10-CM | POA: Insufficient documentation

## 2014-11-21 DIAGNOSIS — I2510 Atherosclerotic heart disease of native coronary artery without angina pectoris: Secondary | ICD-10-CM | POA: Insufficient documentation

## 2014-11-21 DIAGNOSIS — Z87440 Personal history of urinary (tract) infections: Secondary | ICD-10-CM | POA: Insufficient documentation

## 2014-11-21 DIAGNOSIS — Z960 Presence of urogenital implants: Secondary | ICD-10-CM | POA: Insufficient documentation

## 2014-11-21 DIAGNOSIS — R8290 Unspecified abnormal findings in urine: Secondary | ICD-10-CM

## 2014-11-21 DIAGNOSIS — R739 Hyperglycemia, unspecified: Secondary | ICD-10-CM

## 2014-11-21 DIAGNOSIS — I252 Old myocardial infarction: Secondary | ICD-10-CM | POA: Insufficient documentation

## 2014-11-21 DIAGNOSIS — E669 Obesity, unspecified: Secondary | ICD-10-CM | POA: Insufficient documentation

## 2014-11-21 DIAGNOSIS — R509 Fever, unspecified: Secondary | ICD-10-CM | POA: Insufficient documentation

## 2014-11-21 LAB — URINALYSIS, ROUTINE W REFLEX MICROSCOPIC
BILIRUBIN URINE: NEGATIVE
HGB URINE DIPSTICK: NEGATIVE
Ketones, ur: NEGATIVE mg/dL
Leukocytes, UA: NEGATIVE
Nitrite: NEGATIVE
PROTEIN: NEGATIVE mg/dL
Specific Gravity, Urine: 1.03 (ref 1.005–1.030)
Urobilinogen, UA: 0.2 mg/dL (ref 0.0–1.0)
pH: 5.5 (ref 5.0–8.0)

## 2014-11-21 LAB — CBC WITH DIFFERENTIAL/PLATELET
BASOS PCT: 1 %
Basophils Absolute: 0.1 10*3/uL (ref 0.0–0.1)
Eosinophils Absolute: 0.1 10*3/uL (ref 0.0–0.7)
Eosinophils Relative: 2 %
HCT: 41.8 % (ref 39.0–52.0)
Hemoglobin: 14.6 g/dL (ref 13.0–17.0)
Lymphocytes Relative: 29 %
Lymphs Abs: 1.7 10*3/uL (ref 0.7–4.0)
MCH: 32.4 pg (ref 26.0–34.0)
MCHC: 34.9 g/dL (ref 30.0–36.0)
MCV: 92.7 fL (ref 78.0–100.0)
MONO ABS: 0.8 10*3/uL (ref 0.1–1.0)
Monocytes Relative: 13 %
NEUTROS ABS: 3.3 10*3/uL (ref 1.7–7.7)
Neutrophils Relative %: 55 %
Platelets: 215 10*3/uL (ref 150–400)
RBC: 4.51 MIL/uL (ref 4.22–5.81)
RDW: 12.3 % (ref 11.5–15.5)
WBC: 6 10*3/uL (ref 4.0–10.5)

## 2014-11-21 LAB — BASIC METABOLIC PANEL
Anion gap: 10 (ref 5–15)
BUN: 14 mg/dL (ref 6–20)
CALCIUM: 9.7 mg/dL (ref 8.9–10.3)
CHLORIDE: 103 mmol/L (ref 101–111)
CO2: 24 mmol/L (ref 22–32)
CREATININE: 0.64 mg/dL (ref 0.61–1.24)
GFR calc Af Amer: >60 mL/min (ref >60)
GFR calc non Af Amer: >60 mL/min (ref >60)
Glucose, Bld: 393 mg/dL — ABNORMAL HIGH (ref 65–99)
Potassium: 4 mmol/L (ref 3.5–5.1)
SODIUM: 137 mmol/L (ref 135–145)

## 2014-11-21 LAB — BLOOD GAS, VENOUS
ACID-BASE DEFICIT: 0.7 mmol/L (ref 0.0–2.0)
BICARBONATE: 23.8 meq/L (ref 20.0–24.0)
O2 SAT: 60.4 %
PATIENT TEMPERATURE: 98.6
PH VEN: 7.383 — AB (ref 7.250–7.300)
TCO2: 21.1 mmol/L (ref 0–100)
pCO2, Ven: 40.9 mmHg — ABNORMAL LOW (ref 45.0–50.0)
pO2, Ven: 38.1 mmHg (ref 30.0–45.0)

## 2014-11-21 LAB — CBG MONITORING, ED
Glucose-Capillary: 434 mg/dL — ABNORMAL HIGH (ref 65–99)
Glucose-Capillary: 288 mg/dL — ABNORMAL HIGH (ref 65–99)

## 2014-11-21 LAB — URINE MICROSCOPIC-ADD ON

## 2014-11-21 MED ORDER — INSULIN ASPART 100 UNIT/ML ~~LOC~~ SOLN
6.0000 [IU] | Freq: Once | SUBCUTANEOUS | Status: AC
  Administered 2014-11-21: 6 [IU] via SUBCUTANEOUS

## 2014-11-21 MED ORDER — INSULIN ASPART 100 UNIT/ML ~~LOC~~ SOLN
8.0000 [IU] | Freq: Once | SUBCUTANEOUS | Status: DC
  Filled 2014-11-21: qty 1

## 2014-11-21 MED ORDER — SODIUM CHLORIDE 0.9 % IV BOLUS (SEPSIS)
1000.0000 mL | Freq: Once | INTRAVENOUS | Status: AC
Start: 2014-11-21 — End: 2014-11-21
  Administered 2014-11-21: 1000 mL via INTRAVENOUS

## 2014-11-21 NOTE — ED Notes (Signed)
Pt c/o foul odor in his urine and knows that he has UTI bc "ive had plenty of them to know when i do".

## 2014-11-21 NOTE — ED Provider Notes (Signed)
CSN: [id number]     Arrival date & time 11/21/14  1339 History  By signing my name below, I, Shane Bond, attest that this documentation has been prepared under the direction and in the presence of Shane Carnes, PA-C. Electronically Signed: Eustaquio Bond, ED Scribe. 11/21/2014. 2:04 PM.   Chief Complaint  Patient presents with  . odor urine   The history is provided by the patient. No language interpreter was used.     HPI Comments: Shane Bond is a 54 y.o. male with hx DM who presents to the Emergency Department complaining of malodorous urine x 1 day. Pt also complains of subjective fever, mild dysuria, and cloudy urine. His temperature in the ED is 97.5F degrees. Pt has hx of multiple UTIs in the past and states these symptoms feel similar. Patient self caths daily.  Denies abdominal pain, hematuria, or any other associated symptoms. Pt has tried to check his blood sugar level but states his meter has been reading "ketones" instead of sugars.  He states he is not sure what happened to his meter. He denies excessive thirst.  VSS.  Past Medical History  Diagnosis Date  . CAD, NATIVE VESSEL 10/29/2009  . 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 OBESITY 10/22/2009  . Diabetes mellitus without complication    Past Surgical History  Procedure Laterality Date  . Cystoscopy w/ ureteral stent placement  02/26/2012    Procedure: CYSTOSCOPY WITH RETROGRADE PYELOGRAM/URETERAL STENT PLACEMENT;  Surgeon: Bernestine Amass, MD;  Location: [facility];  Service: Urology;  Laterality: Left;  . Coronary stent placement     Family History  Problem Relation Age of Onset  . Diabetes Father   . Coronary artery disease Other   . Heart attack Other    Social History  Substance Use Topics  . Smoking status: Current Every Day Smoker  . Smokeless tobacco: None  . Alcohol Use: No    Review of Systems  Constitutional: Positive for fever (Subjective).  Gastrointestinal: Negative for abdominal pain.  Genitourinary:  Positive for dysuria. Negative for hematuria.  All other systems reviewed and are negative.  Allergies  Review of patient's allergies indicates no known allergies.  Home Medications   Prior to Admission medications   Medication Sig Start Date End Date Taking? Authorizing Provider  aspirin EC 81 MG tablet Take 81 mg by mouth daily.    Historical Provider, MD  cephALEXin (KEFLEX) 500 MG capsule Take 1 capsule (500 mg total) by mouth 4 (four) times daily. 11/11/13   Francine Graven, DO   Triage Vitals: BP 111/72 mmHg  Pulse 102  Temp(Src) 97.8 F (36.6 C) (Oral)  Resp 18  SpO2 96%   Physical Exam  Constitutional: He is oriented to person, place, and time. He appears well-developed and well-nourished. No distress.  HENT:  Head: Normocephalic and atraumatic.  Mouth/Throat: Oropharynx is clear and moist.  Eyes: Conjunctivae and EOM are normal. Pupils are equal, round, and reactive to light.  Neck: Normal range of motion. Neck supple.  Cardiovascular: Normal rate, regular rhythm and normal heart sounds.   Pulmonary/Chest: Effort normal and breath sounds normal. No respiratory distress. He has no wheezes.  Abdominal: Soft. Bowel sounds are normal. There is no tenderness. There is no guarding.  Musculoskeletal: Normal range of motion. He exhibits no edema.  Neurological: He is alert and oriented to person, place, and time.  Skin: Skin is warm and dry. He is not diaphoretic.  Psychiatric: He has a normal mood and affect.  Nursing note and vitals reviewed.   ED Course  Procedures (including critical care time)  DIAGNOSTIC STUDIES: Oxygen Saturation is 96% on RA, normal by my interpretation.    COORDINATION OF CARE: 2:04 PM-Discussed treatment plan which includes CBG, UA, and urine culture with pt at bedside and pt agreed to plan.   Labs Review Labs Reviewed  URINALYSIS, ROUTINE W REFLEX MICROSCOPIC (NOT AT [name]) - Abnormal; Notable for the following:    Glucose, UA >1000 (*)     All other components within normal limits  BASIC METABOLIC PANEL - Abnormal; Notable for the following:    Glucose, Bld 393 (*)    All other components within normal limits  BLOOD GAS, VENOUS - Abnormal; Notable for the following:    pH, Ven 7.383 (*)    pCO2, Ven 40.9 (*)    All other components within normal limits  CBG MONITORING, ED - Abnormal; Notable for the following:    Glucose-Capillary 434 (*)    All other components within normal limits  CBG MONITORING, ED - Abnormal; Notable for the following:    Glucose-Capillary 288 (*)    All other components within normal limits  URINE CULTURE  CBC WITH DIFFERENTIAL/PLATELET  URINE MICROSCOPIC-ADD ON    Imaging Review No results found. I have personally reviewed and evaluated these lab results as part of my medical decision-making.   EKG Interpretation None      MDM   Final diagnoses:  Malodorous urine  Hyperglycemia   54 year old male here with concern for UTI. He has history of the same. He reports malodorous and cloudy urine. Patient is afebrile, nontoxic. Abdominal exam is benign. UA was obtained with glucose noted, no signs of infection. Culture pending. Patient's CBG is 434. Basic labs were obtained which are remarkable for hyperglycemia without DKA.  Patient was treated with IVF and IV insulin.  Will follow-up with PCP.  Discussed plan with patient, he/she acknowledged understanding and agreed with plan of care.  Return precautions given for new or worsening symptoms.  I personally performed the services described in this documentation, which was scribed in my presence. The recorded information has been reviewed and is accurate.    [name], PA-C 11/21/14 1736  Gareth Morgan, MD 11/24/14 [phone]

## 2014-11-21 NOTE — Discharge Instructions (Signed)
Continue your regular home medications. Make sure to keep close check of your sugar. Drink plenty of water to stay hydrated. Return to the ED for new or worsening symptoms.

## 2014-11-21 NOTE — ED Notes (Signed)
Discharge given. IV removed. V/s within normal range.

## 2014-11-21 NOTE — ED Notes (Signed)
cbg 434

## 2014-11-23 LAB — URINE CULTURE

## 2014-11-24 NOTE — Progress Notes (Signed)
ED Antimicrobial Stewardship Positive Culture Follow Up   Shane Bond is an 54 y.o. male who presented to [facility] on 11/21/2014 with a chief complaint of  Chief Complaint  Patient presents with  . odor urine    Recent Results (from the past 720 hour(s))  Urine culture     Status: None   Collection Time: 11/21/14  2:23 PM  Result Value Ref Range Status   Specimen Description URINE, CLEAN CATCH  Final   Special Requests NONE  Final   Culture   Final    >=100,000 COLONIES/mL KLEBSIELLA OXYTOCA Performed at [facility]    Report Status 11/23/2014 FINAL  Final   Organism ID, Bacteria KLEBSIELLA OXYTOCA  Final      Susceptibility   Klebsiella oxytoca - MIC*    AMPICILLIN >=32 RESISTANT Resistant     CEFAZOLIN 8 SENSITIVE Sensitive     CEFTRIAXONE <=1 SENSITIVE Sensitive     CIPROFLOXACIN <=0.25 SENSITIVE Sensitive     GENTAMICIN <=1 SENSITIVE Sensitive     IMIPENEM <=0.25 SENSITIVE Sensitive     NITROFURANTOIN 32 SENSITIVE Sensitive     TRIMETH/SULFA <=20 SENSITIVE Sensitive     AMPICILLIN/SULBACTAM 16 INTERMEDIATE Intermediate     PIP/TAZO <=4 SENSITIVE Sensitive     * >=100,000 COLONIES/mL KLEBSIELLA OXYTOCA     [x]  Patient discharged originally without antimicrobial agent and treatment is now indicated  New antibiotic prescription: Bactrim DS 1 tablet BID x 7 days  ED Provider: Francisca December PA-C  Candie Mile 11/24/2014, 9:05 AM Infectious Diseases Pharmacist Phone# [phone]

## 2014-11-25 ENCOUNTER — Telehealth (HOSPITAL_COMMUNITY): Payer: Self-pay

## 2014-11-25 NOTE — Telephone Encounter (Signed)
Post ED Visit - Positive Culture Follow-up: Chart Hand-off to ED Flow Manager  Culture assessed and recommendations reviewed by: []  Levester Fresh, Pharm.D., BCPS [x]  Heide Guile, Pharm.D., BCPS-AQ ID []  Alycia Rossetti, Pharm.D., BCPS []  [place], [place].D., BCPS, AAHIVP []  Legrand Como, Pharm .D., BCPS, AAHIVP []  Milus Glazier, Pharm.D. []  Dimitri Ped, Pharm.D.  Positive urine culture  [x]  Patient discharged without antimicrobial prescription and treatment is now indicated []  Organism is resistant to prescribed ED discharge antimicrobial []  Patient with positive blood cultures  Changes discussed with ED provider: Dewayne Shorter New antibiotic prescription Bactrim DS 1 tab bid x 7 days   Ileene Musa 11/25/2014, 7:09 AM

## 2015-05-15 ENCOUNTER — Emergency Department (HOSPITAL_COMMUNITY)
Admission: EM | Admit: 2015-05-15 | Discharge: 2015-05-15 | Disposition: A | Discharge status: Home / Self Care | Payer: Self-pay | Attending: Emergency Medicine | Admitting: Emergency Medicine

## 2015-05-15 ENCOUNTER — Encounter (HOSPITAL_COMMUNITY): Payer: Self-pay | Admitting: Emergency Medicine

## 2015-05-15 DIAGNOSIS — F17200 Nicotine dependence, unspecified, uncomplicated: Secondary | ICD-10-CM | POA: Insufficient documentation

## 2015-05-15 DIAGNOSIS — I2510 Atherosclerotic heart disease of native coronary artery without angina pectoris: Secondary | ICD-10-CM | POA: Insufficient documentation

## 2015-05-15 DIAGNOSIS — Z7982 Long term (current) use of aspirin: Secondary | ICD-10-CM | POA: Insufficient documentation

## 2015-05-15 DIAGNOSIS — N390 Urinary tract infection, site not specified: Secondary | ICD-10-CM | POA: Insufficient documentation

## 2015-05-15 DIAGNOSIS — E119 Type 2 diabetes mellitus without complications: Secondary | ICD-10-CM | POA: Insufficient documentation

## 2015-05-15 DIAGNOSIS — K047 Periapical abscess without sinus: Secondary | ICD-10-CM | POA: Insufficient documentation

## 2015-05-15 DIAGNOSIS — Z79899 Other long term (current) drug therapy: Secondary | ICD-10-CM | POA: Insufficient documentation

## 2015-05-15 DIAGNOSIS — E669 Obesity, unspecified: Secondary | ICD-10-CM | POA: Insufficient documentation

## 2015-05-15 LAB — URINALYSIS, ROUTINE W REFLEX MICROSCOPIC
BILIRUBIN URINE: NEGATIVE
Hgb urine dipstick: NEGATIVE
KETONES UR: NEGATIVE mg/dL
Leukocytes, UA: NEGATIVE
NITRITE: NEGATIVE
PH: 6 (ref 5.0–8.0)
Protein, ur: NEGATIVE mg/dL
SPECIFIC GRAVITY, URINE: 1.029 (ref 1.005–1.030)

## 2015-05-15 LAB — URINE MICROSCOPIC-ADD ON: Squamous Epithelial / LPF: NONE SEEN

## 2015-05-15 MED ORDER — CEPHALEXIN 500 MG PO CAPS: 500.0000 mg | ORAL_CAPSULE | Freq: Four times a day (QID) | ORAL | Status: DC

## 2015-05-15 MED ORDER — ONDANSETRON 8 MG PO TBDP: 8.0000 mg | ORAL_TABLET | Freq: Three times a day (TID) | ORAL | Status: DC | PRN

## 2015-05-15 NOTE — Progress Notes (Signed)
Entered in d/c instructions  Please use the resources provided to you in emergency room by case manager to assist with doctor for follow up A referral for you has been sent to [facility] if you have not received a call in 3 days you may contact them Call Karen Andrianos at [phone] Tuesday-Friday [email or web address] These [place] uninsured resources provide possible primary care providers, resources for discounted medications, housing, dental resources, affordable care act information, plus other resources for [place]   

## 2015-05-15 NOTE — ED Notes (Signed)
PT DISCHARGED. INSTRUCTIONS AND PRESCRIPTIONS GIVEN. AAOX3. PT IN NO APPARENT DISTRESS OR PAIN. THE OPPORTUNITY TO ASK QUESTIONS WAS PROVIDED. 

## 2015-05-15 NOTE — ED Notes (Addendum)
Pt reports L upper molar pain with drainage for the last 2 days accompanied by nausea. No v/d. Pt also reports burning with urination. Pt reports he has felt feverish and weak at home.

## 2015-05-15 NOTE — ED Provider Notes (Signed)
CSN: [id number]     Arrival date & time 05/15/15  1108 History   First MD Initiated Contact with Patient 05/15/15 1431     Chief Complaint  Patient presents with  . Dental Pain  . Dysuria     (Consider location/radiation/quality/duration/timing/severity/associated sxs/prior Treatment) HPI Shane Bond is a 55 y.o. male with history of coronary disease, prior MI, diabetes, presents to emergency department complaining of left lower dental pain, gum swelling, as well as dysuria. Patient states both symptoms started several days ago. He is unable to tolerate exactly how many days ago they started. He reports pain and swelling around left lower second molar. He reports pain with chewing. He denies taking medications for this. He also complains of burning with urination and has had chills over the last several days. He denies any fever. No vomiting. Denies abdominal pain or flank pain. No blood in his urine. Patient does self cath due to a back injury "many years ago" and admits to not sterilizing or using sterile cath each time. He has had several UTIs prior to this. Denies scrotal pain or rectal pain. He denies swelling to the scrotum. No penile discharge.  Past Medical History  Diagnosis Date  . CAD, NATIVE VESSEL 10/29/2009  . 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 OBESITY 10/22/2009  . Diabetes mellitus without complication [facility])    Past Surgical History  Procedure Laterality Date  . Cystoscopy w/ ureteral stent placement  02/26/2012    Procedure: CYSTOSCOPY WITH RETROGRADE PYELOGRAM/URETERAL STENT PLACEMENT;  Surgeon: Bernestine Amass, MD;  Location: [facility];  Service: Urology;  Laterality: Left;  . Coronary stent placement     Family History  Problem Relation Age of Onset  . Diabetes Father   . Coronary artery disease Other   . Heart attack Other    Social History  Substance Use Topics  . Smoking status: Current Every Day Smoker  . Smokeless tobacco: None  . Alcohol Use: No    Review of  Systems  Constitutional: Positive for chills. Negative for fever.  HENT: Positive for dental problem. Negative for facial swelling.   Respiratory: Negative for cough, chest tightness and shortness of breath.   Cardiovascular: Negative for chest pain, palpitations and leg swelling.  Gastrointestinal: Negative for nausea, vomiting, abdominal pain, diarrhea and abdominal distention.  Genitourinary: Positive for dysuria, urgency and frequency. Negative for hematuria, penile swelling, scrotal swelling and testicular pain.  Musculoskeletal: Negative for myalgias, arthralgias, neck pain and neck stiffness.  Skin: Negative for rash.  Allergic/Immunologic: Negative for immunocompromised state.  Neurological: Negative for dizziness, weakness, light-headedness, numbness and headaches.  All other systems reviewed and are negative.     Allergies  Review of patient's allergies indicates no known allergies.  Home Medications   Prior to Admission medications   Medication Sig Start Date End Date Taking? Authorizing Provider  aspirin EC 81 MG tablet Take 81 mg by mouth daily.   Yes Historical Provider, MD  Saw Palmetto, Serenoa repens, (SAW PALMETTO PO) Take 1 capsule by mouth daily.   Yes Historical Provider, MD  cephALEXin (KEFLEX) 500 MG capsule Take 1 capsule (500 mg total) by mouth 4 (four) times daily. Patient not taking: Reported on 11/21/2014 11/11/13   Francine Graven, DO   BP 114/76 mmHg  Pulse 90  Temp(Src) 97.8 F (36.6 C) (Oral)  Resp 16  SpO2 96% Physical Exam  Constitutional: He is oriented to person, place, and time. He appears well-developed and well-nourished. No distress.  HENT:  Head: Normocephalic and atraumatic.  Left lower 2nd molar tooth carries with surrounding gum swelling and obvious dental abscess. No trismus. No facial swelling. No swelling under the tongue.   Eyes: Conjunctivae are normal.  Neck: Neck supple.  Cardiovascular: Normal rate, regular rhythm and normal  heart sounds.   Pulmonary/Chest: Effort normal and breath sounds normal. No respiratory distress. He has no wheezes. He has no rales.  Abdominal: Soft. Bowel sounds are normal. He exhibits no distension. There is no tenderness. There is no rebound.  No CVA tenderness  Musculoskeletal: He exhibits no edema.  Neurological: He is alert and oriented to person, place, and time.  Skin: Skin is warm and dry.  Nursing note and vitals reviewed.   ED Course  Procedures (including critical care time) Labs Review Labs Reviewed  URINALYSIS, ROUTINE W REFLEX MICROSCOPIC (NOT AT [facility]) - Abnormal; Notable for the following:    Glucose, UA >1000 (*)    All other components within normal limits  URINE MICROSCOPIC-ADD ON - Abnormal; Notable for the following:    Bacteria, UA MANY (*)    All other components within normal limits  URINE CULTURE  GC/CHLAMYDIA PROBE AMP ([place]) NOT AT [facility]    Imaging Review No results found. I have personally reviewed and evaluated these images and lab results as part of my medical decision-making.   EKG Interpretation None      MDM   Final diagnoses:  Dental abscess  UTI (lower urinary tract infection)   Pt with two separate complaints. Reports dental abscess to the left lower molars for several days and dysuria. Hx of UTIs and kidney stones. Patient states urinary symptoms for several days. Denies any abdominal pain or flank pain. Denies symptoms of a kidney stone. No blood in his urine. Patient does self cath due to a prior back injury. He admitted to me that he cleans his catheters and alcohol and sometimes he reuses them if he does not have alcohol nearby. I discussed with him and advised to use sterile catheter each time he does self cath. Will refer to urology. Patient states he is unable to see urologist because he does not have insurance. I spoke with Maudie Mercury the case manager who will provide him with some outpatient resources for follow-up. At this time  patient denies any fever, no vomiting, no abdominal pain or back pain. He has no evidence of sepsis or pyelonephritis. He stable for discharge home. Will start on Keflex for both UTI and dental infection. Follow up advised.   Filed Vitals:   05/15/15 1149  BP: 114/76  Pulse: 90  Temp: 97.8 F (36.6 C)  TempSrc: Oral  Resp: 16  SpO2: 96%     Jeannett Senior, PA-C 05/15/15 [place], PA-C 05/15/15 [place], MD 05/15/15 1538

## 2015-05-15 NOTE — Discharge Instructions (Signed)
Take keflex as prescribed for infection. zofran as needed for nausea. Follow up with primary care doctor. Return if worsening.   Dental Abscess A dental abscess is a collection of pus in or around a tooth. CAUSES This condition is caused by a bacterial infection around the root of the tooth that involves the inner part of the tooth (pulp). It may result from:  Severe tooth decay.  Trauma to the tooth that allows bacteria to enter into the pulp, such as a broken or chipped tooth.  Severe gum disease around a tooth. SYMPTOMS Symptoms of this condition include:  Severe pain in and around the infected tooth.  Swelling and redness around the infected tooth, in the mouth, or in the face.  Tenderness.  Pus drainage.  Bad breath.  Bitter taste in the mouth.  Difficulty swallowing.  Difficulty opening the mouth.  Nausea.  Vomiting.  Chills.  Swollen neck glands.  Fever. DIAGNOSIS This condition is diagnosed with examination of the infected tooth. During the exam, your dentist may tap on the infected tooth. Your dentist will also ask about your medical and dental history and may order X-rays. TREATMENT This condition is treated by eliminating the infection. This may be done with:  Antibiotic medicine.  A root canal. This may be performed to save the tooth.  Pulling (extracting) the tooth. This may also involve draining the abscess. This is done if the tooth cannot be saved. HOME CARE INSTRUCTIONS  Take medicines only as directed by your dentist.  If you were prescribed antibiotic medicine, finish all of it even if you start to feel better.  Rinse your mouth (gargle) often with salt water to relieve pain or swelling.  Do not drive or operate heavy machinery while taking pain medicine.  Do not apply heat to the outside of your mouth.  Keep all follow-up visits as directed by your dentist. This is important. SEEK MEDICAL CARE IF:  Your pain is worse and is not  helped by medicine. SEEK IMMEDIATE MEDICAL CARE IF:  You have a fever or chills.  Your symptoms suddenly get worse.  You have a very bad headache.  You have problems breathing or swallowing.  You have trouble opening your mouth.  You have swelling in your neck or around your eye.   This information is not intended to replace advice given to you by your health care provider. Make sure you discuss any questions you have with your health care provider.   Document Released: 02/07/2005 Document Revised: 06/24/2014 Document Reviewed: 02/04/2014 Elsevier Interactive Patient Education 2016 Elsevier Inc.  Urinary Tract Infection Urinary tract infections (UTIs) can develop anywhere along your urinary tract. Your urinary tract is your body's drainage system for removing wastes and extra water. Your urinary tract includes two kidneys, two ureters, a bladder, and a urethra. Your kidneys are a pair of bean-shaped organs. Each kidney is about the size of your fist. They are located below your ribs, one on each side of your spine. CAUSES Infections are caused by microbes, which are microscopic organisms, including fungi, viruses, and bacteria. These organisms are so small that they can only be seen through a microscope. Bacteria are the microbes that most commonly cause UTIs. SYMPTOMS  Symptoms of UTIs may vary by age and gender of the patient and by the location of the infection. Symptoms in young women typically include a frequent and intense urge to urinate and a painful, burning feeling in the bladder or urethra during urination. Older women  and men are more likely to be tired, shaky, and weak and have muscle aches and abdominal pain. A fever may mean the infection is in your kidneys. Other symptoms of a kidney infection include pain in your back or sides below the ribs, nausea, and vomiting. DIAGNOSIS To diagnose a UTI, your caregiver will ask you about your symptoms. Your caregiver will also ask  you to provide a urine sample. The urine sample will be tested for bacteria and white blood cells. White blood cells are made by your body to help fight infection. TREATMENT  Typically, UTIs can be treated with medication. Because most UTIs are caused by a bacterial infection, they usually can be treated with the use of antibiotics. The choice of antibiotic and length of treatment depend on your symptoms and the type of bacteria causing your infection. HOME CARE INSTRUCTIONS  If you were prescribed antibiotics, take them exactly as your caregiver instructs you. Finish the medication even if you feel better after you have only taken some of the medication.  Drink enough water and fluids to keep your urine clear or pale yellow.  Avoid caffeine, tea, and carbonated beverages. They tend to irritate your bladder.  Empty your bladder often. Avoid holding urine for long periods of time.  Empty your bladder before and after sexual intercourse.  After a bowel movement, women should cleanse from front to back. Use each tissue only once. SEEK MEDICAL CARE IF:   You have back pain.  You develop a fever.  Your symptoms do not begin to resolve within 3 days. SEEK IMMEDIATE MEDICAL CARE IF:   You have severe back pain or lower abdominal pain.  You develop chills.  You have nausea or vomiting.  You have continued burning or discomfort with urination. MAKE SURE YOU:   Understand these instructions.  Will watch your condition.  Will get help right away if you are not doing well or get worse.   This information is not intended to replace advice given to you by your health care provider. Make sure you discuss any questions you have with your health care provider.   Document Released: 11/17/2004 Document Revised: 10/29/2014 Document Reviewed: 03/18/2011 Elsevier Interactive Patient Education [employer].

## 2015-05-15 NOTE — Progress Notes (Addendum)
CM spoke with pt who confirms uninsured [employer] resident with no pcp.  CM discussed and provided written information for uninsured accepting pcps, discussed the importance of pcp vs EDP services for f/u care, www.needymeds.org, www.goodrx.com, discounted pharmacies and other [employer] such as [employer] [facility] [employer], affordable care act, financial assistance, uninsured dental services,  med assist, DSS and  health department  Reviewed resources for [employer] uninsured accepting pcps like [facility], family medicine at [address], [facility], [facility], local urgent care centers, [facility], [facility], general medical clinics, family services of the [place], [facility] urgent care plus others, medication resources, [facility] out patient pharmacies and housing Pt voiced understanding and appreciation of resources provided   Provided [facility] contact information Pt agreed to a referral Cm completed referral Pt to be contact by [facility] clinical liason Denied having an orange card in past  Pt had to be redirected when he began talking about various commercials on tv for blood thinners and antibiotics and law suits Pt encourage good pcp care to prevent him having issues with antibiotics or blood thinners with lab management He voiced understanding  Male visitor at bedside  Reminded pt of review of similar resources in 2015 by another [facility] ED CM Pt states he wears glasses and is has difficulty hearing "I don't have any money and my momma tries to help me out" his mother is not the present male visitor

## 2015-05-16 ENCOUNTER — Emergency Department (HOSPITAL_COMMUNITY)
Admission: EM | Admit: 2015-05-16 | Discharge: 2015-05-17 | Disposition: A | Discharge status: Home / Self Care | Payer: Self-pay | Attending: Emergency Medicine | Admitting: Emergency Medicine

## 2015-05-16 ENCOUNTER — Encounter (HOSPITAL_COMMUNITY): Payer: Self-pay | Admitting: Oncology

## 2015-05-16 DIAGNOSIS — L729 Follicular cyst of the skin and subcutaneous tissue, unspecified: Secondary | ICD-10-CM | POA: Insufficient documentation

## 2015-05-16 DIAGNOSIS — Z79899 Other long term (current) drug therapy: Secondary | ICD-10-CM | POA: Insufficient documentation

## 2015-05-16 DIAGNOSIS — I2510 Atherosclerotic heart disease of native coronary artery without angina pectoris: Secondary | ICD-10-CM | POA: Insufficient documentation

## 2015-05-16 DIAGNOSIS — E119 Type 2 diabetes mellitus without complications: Secondary | ICD-10-CM | POA: Insufficient documentation

## 2015-05-16 DIAGNOSIS — I252 Old myocardial infarction: Secondary | ICD-10-CM | POA: Insufficient documentation

## 2015-05-16 DIAGNOSIS — Z792 Long term (current) use of antibiotics: Secondary | ICD-10-CM | POA: Insufficient documentation

## 2015-05-16 DIAGNOSIS — Z7982 Long term (current) use of aspirin: Secondary | ICD-10-CM | POA: Insufficient documentation

## 2015-05-16 DIAGNOSIS — E669 Obesity, unspecified: Secondary | ICD-10-CM | POA: Insufficient documentation

## 2015-05-16 DIAGNOSIS — F17200 Nicotine dependence, unspecified, uncomplicated: Secondary | ICD-10-CM | POA: Insufficient documentation

## 2015-05-16 NOTE — ED Notes (Signed)
Pt noted a painful lump in his scrotum causing pain.  Pt rates pain 4/10.

## 2015-05-17 NOTE — Discharge Instructions (Signed)
Excision of Skin Lesions °Excision of a skin lesion refers to the removal of a section of skin by making small cuts (incisions) in the skin. This procedure may be done to remove a cancerous (malignant) or noncancerous (benign) growth on the skin. It is typically done to treat or prevent cancer or infection. It may also be done to improve cosmetic appearance. °The procedure may be done to remove: °· Cancerous growths, such as basal cell carcinoma, squamous cell carcinoma, or melanoma. °· Noncancerous growths, such as a cyst or lipoma. °· Growths, such as moles or skin tags, which may be removed for cosmetic reasons. °Various excision or surgical techniques may be used depending on your condition, the location of the lesion, and your overall health. °LET YOUR HEALTH CARE PROVIDER KNOW ABOUT: °· Any allergies you have. °· All medicines you are taking, including vitamins, herbs, eye drops, creams, and over-the-counter medicines. °· Previous problems you or members of your family have had with the use of anesthetics. °· Any blood disorders you have. °· Previous surgeries you have had. °· Any medical conditions you have. °· Whether you are pregnant or may be pregnant. °RISKS AND COMPLICATIONS °Generally, this is a safe procedure. However, problems may occur, including: °· Bleeding. °· Infection. °· Scarring. °· Recurrence of the cyst, lipoma, or cancer. °· Changes in skin sensation or appearance, such as discoloration or swelling. °· Reaction to the anesthetics. °· Allergic reaction to surgical materials or ointments. °· Damage to nerves, blood vessels, muscles, or other structures. °· Continued pain. °BEFORE THE PROCEDURE °· Ask your health care provider about: °¨ Changing or stopping your regular medicines. This is especially important if you are taking diabetes medicines or blood thinners. °¨ Taking medicines such as aspirin and ibuprofen. These medicines can thin your blood. Do not take these medicines before your  procedure if your health care provider instructs you not to. °· You may be asked to take certain medicines. °· You may be asked to stop smoking. °· You may have an exam or testing. °· Plan to have someone take you home after the procedure. °· Plan to have someone help you with activities during recovery. °PROCEDURE °· To reduce your risk of infection: °¨ Your health care team will wash or sanitize their hands. °¨ Your skin will be washed with soap. °· You will be given a medicine to numb the area (local anesthetic). °· One of the following excision techniques will be performed. °· At the end of any of these procedures, antibiotic ointment will be applied as needed. °Each of the following techniques may vary among health care providers and hospitals. °Complete Surgical Excision °The area of skin that needs to be removed will be marked with a pen. Using a small scalpel or scissors, the surgeon will gently cut around and under the lesion until it is completely removed. The lesion will be placed in a fluid and sent to the lab for examination. If necessary, bleeding will be controlled with a device that delivers heat (electrocautery). The edges of the wound may be stitched (sutured) together, and a bandage (dressing) will be applied. This procedure may be performed to treat a cancerous growth or a noncancerous cyst or lesion. °Excision of a Cyst °The surgeon will make an incision on the cyst. The entire cyst will be removed through the incision. The incision may be closed with sutures. °Shave Excision °During shave excision, the surgeon will use a small blade or an electrically heated loop instrument to   shave off the lesion. This may be done to remove a mole or a skin tag. The wound will usually be left to heal on its own without sutures. °Punch Excision °During punch excision, the surgeon will use a small tool that is like a cookie cutter or a hole punch to cut a circle shape out of the skin. The outer edges of the skin  will be sutured together. This may be done to remove a mole or a scar or to perform a biopsy of the lesion. °Mohs Micrographic Surgery °During Mohs micrographic surgery, layers of the lesion will be removed with a scalpel or a loop instrument and will be examined right away under a microscope. Layers will be removed until all of the abnormal or cancerous tissue has been removed. This procedure is minimally invasive, and it ensures the best cosmetic outcome. It involves the removal of as little normal tissue as possible. Mohs is usually done to treat skin cancer, such as basal cell carcinoma or squamous cell carcinoma, particularly on the face and ears. Depending on the size of the surgical wound, it may be sutured closed. °AFTER THE PROCEDURE °· Return to your normal activities as told by your health care provider. °· Talk with your health care provider to discuss any test results, treatment options, and if necessary, the need for more tests. °  °This information is not intended to replace advice given to you by your health care provider. Make sure you discuss any questions you have with your health care provider. °  °Document Released: 05/04/2009 Document Revised: 10/29/2014 Document Reviewed: 03/26/2014 °Elsevier Interactive Patient Education ©2016 Elsevier Inc. ° °

## 2015-05-17 NOTE — ED Provider Notes (Signed)
CSN: [id number]     Arrival date & time 05/16/15  2138 History   First MD Initiated Contact with Patient 05/17/15 0117     Chief Complaint  Patient presents with  . Groin Pain     (Consider location/radiation/quality/duration/timing/severity/associated sxs/prior Treatment) HPI Comments: Patient presents with complaint of painful area to scrotum. He reports he always has a "lump" there that is now painful. No trouble urinating. No penile pain or groin rash. No fever.   Patient is a 56 y.o. male presenting with groin pain. The history is provided by the patient. No language interpreter was used.  Groin Pain This is a recurrent problem. Pertinent negatives include no fever or myalgias.    Past Medical History  Diagnosis Date  . CAD, NATIVE VESSEL 10/29/2009  . 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 OBESITY 10/22/2009  . Diabetes mellitus without complication [facility])    Past Surgical History  Procedure Laterality Date  . Cystoscopy w/ ureteral stent placement  02/26/2012    Procedure: CYSTOSCOPY WITH RETROGRADE PYELOGRAM/URETERAL STENT PLACEMENT;  Surgeon: Bernestine Amass, MD;  Location: [facility];  Service: Urology;  Laterality: Left;  . Coronary stent placement     Family History  Problem Relation Age of Onset  . Diabetes Father   . Coronary artery disease Other   . Heart attack Other    Social History  Substance Use Topics  . Smoking status: Current Every Day Smoker  . Smokeless tobacco: None  . Alcohol Use: No    Review of Systems  Constitutional: Negative for fever.  Genitourinary:       See HPI.  Musculoskeletal: Negative for myalgias.  Skin:       See HPI.      Allergies  Review of patient's allergies indicates no known allergies.  Home Medications   Prior to Admission medications   Medication Sig Start Date End Date Taking? Authorizing Provider  aspirin EC 81 MG tablet Take 81 mg by mouth daily.   Yes Historical Provider, MD  cephALEXin (KEFLEX) 500 MG capsule Take 1  capsule (500 mg total) by mouth 4 (four) times daily. 05/15/15  Yes Tatyana Kirichenko, PA-C  Saw Palmetto, Serenoa repens, (SAW PALMETTO PO) Take 1 capsule by mouth daily.   Yes Historical Provider, MD  ondansetron (ZOFRAN ODT) 8 MG disintegrating tablet Take 1 tablet (8 mg total) by mouth every 8 (eight) hours as needed for nausea or vomiting. Patient not taking: Reported on 05/17/2015 05/15/15   Tatyana Kirichenko, PA-C   BP 128/89 mmHg  Pulse 99  Temp(Src) 98.2 F (36.8 C) (Oral)  Resp 14  SpO2 98% Physical Exam  Constitutional: He is oriented to person, place, and time. He appears well-developed and well-nourished.  Neck: Normal range of motion.  Pulmonary/Chest: Effort normal.  Genitourinary:  Testicles non-tender bilaterally, no swelling. No scrotal redness. There is a large, non-tender, cyst at base of scrotum. No fluctuance. No redness or drainage.   Musculoskeletal: Normal range of motion.  Neurological: He is alert and oriented to person, place, and time.  Skin: Skin is warm and dry.  Psychiatric: He has a normal mood and affect.    ED Course  Procedures (including critical care time) Labs Review Labs Reviewed - No data to display  Imaging Review No results found. I have personally reviewed and evaluated these images and lab results as part of my medical decision-making.   EKG Interpretation None      MDM   Final diagnoses:  None  1. Scrotal cyst  Patient's symptoms are chronic. No evidence acute infection. Feel he can be discharged home with urology follow up.     Charlann Lange, PA-C 05/17/15 [place], MD 05/17/15 [phone]

## 2015-05-18 ENCOUNTER — Emergency Department (HOSPITAL_COMMUNITY): Payer: Self-pay

## 2015-05-18 ENCOUNTER — Emergency Department (HOSPITAL_COMMUNITY)
Admission: EM | Admit: 2015-05-18 | Discharge: 2015-05-18 | Disposition: A | Discharge status: Home / Self Care | Payer: Self-pay | Attending: Emergency Medicine | Admitting: Emergency Medicine

## 2015-05-18 ENCOUNTER — Encounter (HOSPITAL_COMMUNITY): Payer: Self-pay | Admitting: Emergency Medicine

## 2015-05-18 DIAGNOSIS — N50812 Left testicular pain: Secondary | ICD-10-CM | POA: Insufficient documentation

## 2015-05-18 DIAGNOSIS — N5089 Other specified disorders of the male genital organs: Secondary | ICD-10-CM

## 2015-05-18 DIAGNOSIS — N50811 Right testicular pain: Secondary | ICD-10-CM | POA: Insufficient documentation

## 2015-05-18 DIAGNOSIS — Z79899 Other long term (current) drug therapy: Secondary | ICD-10-CM | POA: Insufficient documentation

## 2015-05-18 DIAGNOSIS — Z792 Long term (current) use of antibiotics: Secondary | ICD-10-CM | POA: Insufficient documentation

## 2015-05-18 DIAGNOSIS — F17200 Nicotine dependence, unspecified, uncomplicated: Secondary | ICD-10-CM | POA: Insufficient documentation

## 2015-05-18 DIAGNOSIS — Z9861 Coronary angioplasty status: Secondary | ICD-10-CM | POA: Insufficient documentation

## 2015-05-18 DIAGNOSIS — I2510 Atherosclerotic heart disease of native coronary artery without angina pectoris: Secondary | ICD-10-CM | POA: Insufficient documentation

## 2015-05-18 DIAGNOSIS — Z7982 Long term (current) use of aspirin: Secondary | ICD-10-CM | POA: Insufficient documentation

## 2015-05-18 DIAGNOSIS — L03818 Cellulitis of other sites: Secondary | ICD-10-CM

## 2015-05-18 DIAGNOSIS — N492 Inflammatory disorders of scrotum: Secondary | ICD-10-CM | POA: Insufficient documentation

## 2015-05-18 DIAGNOSIS — L0291 Cutaneous abscess, unspecified: Secondary | ICD-10-CM

## 2015-05-18 DIAGNOSIS — E119 Type 2 diabetes mellitus without complications: Secondary | ICD-10-CM | POA: Insufficient documentation

## 2015-05-18 DIAGNOSIS — E669 Obesity, unspecified: Secondary | ICD-10-CM | POA: Insufficient documentation

## 2015-05-18 LAB — URINALYSIS, ROUTINE W REFLEX MICROSCOPIC
Bilirubin Urine: NEGATIVE
Glucose, UA: >1000 mg/dL — AB
Hgb urine dipstick: NEGATIVE
Ketones, ur: >80 mg/dL — AB
LEUKOCYTES UA: NEGATIVE
NITRITE: NEGATIVE
PH: 5.5 (ref 5.0–8.0)
Protein, ur: NEGATIVE mg/dL
SPECIFIC GRAVITY, URINE: 1.043 — AB (ref 1.005–1.030)

## 2015-05-18 LAB — CBG MONITORING, ED
GLUCOSE-CAPILLARY: 222 mg/dL — AB (ref 65–99)
Glucose-Capillary: 411 mg/dL — ABNORMAL HIGH (ref 65–99)

## 2015-05-18 LAB — URINE CULTURE

## 2015-05-18 LAB — URINE MICROSCOPIC-ADD ON

## 2015-05-18 MED ORDER — SULFAMETHOXAZOLE-TRIMETHOPRIM 800-160 MG PO TABS: 1.0000 | ORAL_TABLET | Freq: Two times a day (BID) | ORAL | Status: AC

## 2015-05-18 MED ORDER — INSULIN ASPART 100 UNIT/ML ~~LOC~~ SOLN
10.0000 [IU] | Freq: Once | SUBCUTANEOUS | Status: AC
  Administered 2015-05-18: 10 [IU] via SUBCUTANEOUS
  Filled 2015-05-18: qty 1

## 2015-05-18 NOTE — Discharge Instructions (Signed)
Schedule a follow up appointment with a PCP at [facility] as discussed with the case manager. It is very important to have a primary care doctor manage your diabetes. Your blood sugar was high today and you will need to closely monitor this. Schedule an appointment with urology to discuss your recurrent scrotal abscesses. Return to ED with new, worsening or concerning symptoms.    Cellulitis Cellulitis is an infection of the skin and the tissue beneath it. The infected area is usually red and tender. Cellulitis occurs most often in the arms and lower legs.  CAUSES  Cellulitis is caused by bacteria that enter the skin through cracks or cuts in the skin. The most common types of bacteria that cause cellulitis are staphylococci and streptococci. SIGNS AND SYMPTOMS   Redness and warmth.  Swelling.  Tenderness or pain.  Fever. DIAGNOSIS  Your health care provider can usually determine what is wrong based on a physical exam. Blood tests may also be done. TREATMENT  Treatment usually involves taking an antibiotic medicine. HOME CARE INSTRUCTIONS  1. Take your antibiotic medicine as directed by your health care provider. Finish the antibiotic even if you start to feel better. 2. Keep the infected arm or leg elevated to reduce swelling. 3. Apply a warm cloth to the affected area up to 4 times per day to relieve pain. 4. Take medicines only as directed by your health care provider. 5. Keep all follow-up visits as directed by your health care provider. SEEK MEDICAL CARE IF:   You notice red streaks coming from the infected area.  Your red area gets larger or turns dark in color.  Your bone or joint underneath the infected area becomes painful after the skin has healed.  Your infection returns in the same area or another area.  You notice a swollen bump in the infected area.  You develop new symptoms.  You have a fever. SEEK IMMEDIATE MEDICAL CARE IF:   You feel very  sleepy.  You develop vomiting or diarrhea.  You have a general ill feeling (malaise) with muscle aches and pains.   This information is not intended to replace advice given to you by your health care provider. Make sure you discuss any questions you have with your health care provider.   Document Released: 11/17/2004 Document Revised: 10/29/2014 Document Reviewed: 04/25/2011 Elsevier Interactive Patient Education 2016 Elsevier Inc.  Abscess An abscess is an infected area that contains a collection of pus and debris.It can occur in almost any part of the body. An abscess is also known as a furuncle or boil. CAUSES  An abscess occurs when tissue gets infected. This can occur from blockage of oil or sweat glands, infection of hair follicles, or a minor injury to the skin. As the body tries to fight the infection, pus collects in the area and creates pressure under the skin. This pressure causes pain. People with weakened immune systems have difficulty fighting infections and get certain abscesses more often.  SYMPTOMS Usually an abscess develops on the skin and becomes a painful mass that is red, warm, and tender. If the abscess forms under the skin, you may feel a moveable soft area under the skin. Some abscesses break open (rupture) on their own, but most will continue to get worse without care. The infection can spread deeper into the body and eventually into the bloodstream, causing you to feel ill.  DIAGNOSIS  Your caregiver will take your medical history and perform a physical  exam. A sample of fluid may also be taken from the abscess to determine what is causing your infection. TREATMENT  Your caregiver may prescribe antibiotic medicines to fight the infection. However, taking antibiotics alone usually does not cure an abscess. Your caregiver may need to make a small cut (incision) in the abscess to drain the pus. In some cases, gauze is packed into the abscess to reduce pain and to  continue draining the area. HOME CARE INSTRUCTIONS   Only take over-the-counter or prescription medicines for pain, discomfort, or fever as directed by your caregiver.  If you were prescribed antibiotics, take them as directed. Finish them even if you start to feel better.  If gauze is used, follow your caregiver's directions for changing the gauze.  To avoid spreading the infection:  Keep your draining abscess covered with a bandage.  Wash your hands well.  Do not share personal care items, towels, or whirlpools with others.  Avoid skin contact with others.  Keep your skin and clothes clean around the abscess.  Keep all follow-up appointments as directed by your caregiver. SEEK MEDICAL CARE IF:  6. You have increased pain, swelling, redness, fluid drainage, or bleeding. 7. You have muscle aches, chills, or a general ill feeling. 8. You have a fever. MAKE SURE YOU:   Understand these instructions.  Will watch your condition.  Will get help right away if you are not doing well or get worse.   This information is not intended to replace advice given to you by your health care provider. Make sure you discuss any questions you have with your health care provider.   Document Released: 11/17/2004 Document Revised: 08/09/2011 Document Reviewed: 04/22/2011 Elsevier Interactive Patient Education 2016 [employer].  How to Take a [employer] A sitz bath is a warm water bath that is taken while you are sitting down. The water should only come up to your hips and should cover your buttocks. Your health care provider may recommend a sitz bath to help you:   Clean the lower part of your body, including your genital area.  With itching.  With pain.  With sore muscles or muscles that tighten or spasm. HOW TO TAKE A SITZ BATH Take 3-4 sitz baths per day or as told by your health care provider. 9. Partially fill a bathtub with warm water. You will only need the water to be deep enough  to cover your hips and buttocks when you are sitting in it. 10. If your health care provider told you to put medicine in the water, follow the directions exactly. 11. Sit in the water and open the tub drain a little. 12. Turn on the warm water again to keep the tub at the correct level. Keep the water running constantly. 13. Soak in the water for 15-20 minutes or as told by your health care provider. 14. After the sitz bath, pat the affected area dry first. Do not rub it. 15. Be careful when you stand up after the sitz bath because you may feel dizzy. SEEK MEDICAL CARE IF:  Your symptoms get worse. Do not continue with sitz baths if your symptoms get worse.  You have new symptoms. Do not continue with sitz baths until you talk with your health care provider.   This information is not intended to replace advice given to you by your health care provider. Make sure you discuss any questions you have with your health care provider.   Document Released: 10/31/2003 Document Revised:  06/24/2014 Document Reviewed: 02/05/2014 Elsevier Interactive Patient Education [employer].

## 2015-05-18 NOTE — ED Notes (Signed)
US at bedside

## 2015-05-18 NOTE — ED Provider Notes (Signed)
CSN: [id number]     Arrival date & time 05/18/15  1202 History   First MD Initiated Contact with Patient 05/18/15 1650     Chief Complaint  Patient presents with  . Cyst   HPI  Mr. Antosh is a 55 year old male with PMHx of CAD, obesity and DM presenting with a testicular abscess. Pt reports a tender mass to this testicles over the past few days. He states that he always has intermittent swelling in his scrotum but typically it is not painful. He was seen in ED yesterday for same complaint and discharged home with urology follow up. He states that he woke this morning and the abscess was draining blood and foul smelling fluid. He denies associated fevers, chills, dizziness, syncope, abdominal pain, nausea, vomiting, painful BMs, dysuria, penile discharge, penile pain, or redness of the scrotum. Denies recent STD exposure. He is currently taking keflex for a UTI. He has frequent UTIs for which he is often seen in ED. He has never followed with urology. He has a history of DM but does not take any medications for this. He states that he gets his care for DM from the ED. He has no other complaints today.   Past Medical History  Diagnosis Date  . CAD, NATIVE VESSEL 10/29/2009  . 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 OBESITY 10/22/2009  . Diabetes mellitus without complication [facility])    Past Surgical History  Procedure Laterality Date  . Cystoscopy w/ ureteral stent placement  02/26/2012    Procedure: CYSTOSCOPY WITH RETROGRADE PYELOGRAM/URETERAL STENT PLACEMENT;  Surgeon: Bernestine Amass, MD;  Location: [facility];  Service: Urology;  Laterality: Left;  . Coronary stent placement     Family History  Problem Relation Age of Onset  . Diabetes Father   . Coronary artery disease Other   . Heart attack Other    Social History  Substance Use Topics  . Smoking status: Current Every Day Smoker  . Smokeless tobacco: None  . Alcohol Use: No    Review of Systems  Genitourinary: Positive for scrotal swelling and  testicular pain. Negative for dysuria, hematuria, discharge, difficulty urinating, genital sores and penile pain.  All other systems reviewed and are negative.     Allergies  Review of patient's allergies indicates no known allergies.  Home Medications   Prior to Admission medications   Medication Sig Start Date End Date Taking? Authorizing Provider  acetaminophen (TYLENOL) 500 MG tablet Take 1,000 mg by mouth every 6 (six) hours as needed for mild pain, moderate pain or headache.   Yes Historical Provider, MD  aspirin EC 81 MG tablet Take 81 mg by mouth daily.   Yes Historical Provider, MD  cephALEXin (KEFLEX) 500 MG capsule Take 1 capsule (500 mg total) by mouth 4 (four) times daily. 05/15/15  Yes Tatyana Kirichenko, PA-C  Saw Palmetto, Serenoa repens, (SAW PALMETTO PO) Take 1 capsule by mouth daily.   Yes Historical Provider, MD  ondansetron (ZOFRAN ODT) 8 MG disintegrating tablet Take 1 tablet (8 mg total) by mouth every 8 (eight) hours as needed for nausea or vomiting. Patient not taking: Reported on 05/17/2015 05/15/15   Lahoma Rocker Kirichenko, PA-C  sulfamethoxazole-trimethoprim (BACTRIM DS,SEPTRA DS) 800-160 MG tablet Take 1 tablet by mouth 2 (two) times daily. 05/18/15 05/25/15  Jinelle Butchko, PA-C   BP 132/91 mmHg  Pulse 84  Temp(Src) 98.2 F (36.8 C) (Oral)  Resp 16  SpO2 93% Physical Exam  Constitutional: He appears well-developed and well-nourished. No  distress.  Nontoxic appearing  HENT:  Head: Normocephalic and atraumatic.  Right Ear: External ear normal.  Left Ear: External ear normal.  Eyes: Conjunctivae are normal. Right eye exhibits no discharge. Left eye exhibits no discharge. No scleral icterus.  Neck: Normal range of motion.  Cardiovascular: Normal rate.   Pulmonary/Chest: Effort normal.  Abdominal: Soft. Bowel sounds are normal. He exhibits no distension. There is no tenderness.  Genitourinary: Penis normal. Right testis shows swelling and tenderness. Left testis  shows swelling and tenderness. Uncircumcised.  Swelling and induration at the base of the scrotum. Area is draining blood from a small pinpoint of skin breakdown. Scrotum is TTP. No overlying erythema or warmth. No inguinal adenopathy.   Musculoskeletal: Normal range of motion.  Moves all extremities spontaneously  Neurological: He is alert. Coordination normal.  Skin: Skin is warm and dry.  Psychiatric: He has a normal mood and affect. His behavior is normal.  Nursing note and vitals reviewed.   ED Course  Procedures (including critical care time) Labs Review Labs Reviewed  URINALYSIS, ROUTINE W REFLEX MICROSCOPIC (NOT AT [facility]) - Abnormal; Notable for the following:    Specific Gravity, Urine 1.043 (*)    Glucose, UA >1000 (*)    Ketones, ur >80 (*)    All other components within normal limits  URINE MICROSCOPIC-ADD ON - Abnormal; Notable for the following:    Squamous Epithelial / LPF 0-5 (*)    Bacteria, UA RARE (*)    All other components within normal limits  CBG MONITORING, ED - Abnormal; Notable for the following:    Glucose-Capillary 411 (*)    All other components within normal limits  CBG MONITORING, ED - Abnormal; Notable for the following:    Glucose-Capillary 222 (*)    All other components within normal limits    Imaging Review US Scrotum  05/18/2015  CLINICAL DATA:  Scrotal mass. EXAM: SCROTAL ULTRASOUND DOPPLER ULTRASOUND OF THE TESTICLES TECHNIQUE: Complete ultrasound examination of the testicles, epididymis, and other scrotal structures was performed. Color and spectral Doppler ultrasound were also utilized to evaluate blood flow to the testicles. COMPARISON:  None. FINDINGS: Right testicle Measurements: 4.7 x 2.3 x 4.5 cm. No mass or microlithiasis visualized. Left testicle Measurements: 3.8 x 1.1 x 2.6 cm. No mass or microlithiasis visualized. Right epididymis: Small epididymal cysts. Otherwise normal epididymis. Left epididymis:  Mild heterogeneous echogenicity  Hydrocele:  None visualized. Varicocele:  None visualized. Pulsed Doppler interrogation of both testes demonstrates normal low resistance arterial and venous waveforms bilaterally. Other: Scrotal wall thickening, left greater than right. Soft tissue edema between the anus in the scrotum with a possible 2.2 x 0.9 x 5.5 cm complex fluid collection concerning for a small abscess. IMPRESSION: 1. No testicular mass.  No testicular torsion. 2. Findings concerning for perineal cellulitis with possible small abscess measuring 2.2 x 0.9 x 5.5 cm. Electronically Signed   By: Kathreen Devoid   On: 05/18/2015 19:16   [place] Art/ven Flow Abd Pelv Doppler  05/18/2015  CLINICAL DATA:  Scrotal mass. EXAM: SCROTAL ULTRASOUND DOPPLER ULTRASOUND OF THE TESTICLES TECHNIQUE: Complete ultrasound examination of the testicles, epididymis, and other scrotal structures was performed. Color and spectral Doppler ultrasound were also utilized to evaluate blood flow to the testicles. COMPARISON:  None. FINDINGS: Right testicle Measurements: 4.7 x 2.3 x 4.5 cm. No mass or microlithiasis visualized. Left testicle Measurements: 3.8 x 1.1 x 2.6 cm. No mass or microlithiasis visualized. Right epididymis: Small epididymal cysts. Otherwise normal epididymis. Left  epididymis:  Mild heterogeneous echogenicity Hydrocele:  None visualized. Varicocele:  None visualized. Pulsed Doppler interrogation of both testes demonstrates normal low resistance arterial and venous waveforms bilaterally. Other: Scrotal wall thickening, left greater than right. Soft tissue edema between the anus in the scrotum with a possible 2.2 x 0.9 x 5.5 cm complex fluid collection concerning for a small abscess. IMPRESSION: 1. No testicular mass.  No testicular torsion. 2. Findings concerning for perineal cellulitis with possible small abscess measuring 2.2 x 0.9 x 5.5 cm. Electronically Signed   By: Kathreen Devoid   On: 05/18/2015 19:16   I have personally reviewed and evaluated these  images and lab results as part of my medical decision-making.   EKG Interpretation None      MDM   Final diagnoses:  Scrotal mass  Cellulitis of other specified site  Abscess   55 year old male presenting with painful mass with drainage of his scrotum. He reports similar scrotal masses in the past that have not required medical intervention. He has never followed with urology. He does not have a PCP. Afebrile and hemodynamically stable. Swelling and tenderness of the scrotum. There is active drainage of serosanguineous fluid. Abdomen is soft, non-tender without peritoneal signs. US of the scrotum shows concerning perineal cellulitis with possible small abscess. CBG 411. Glucose and ketones in urine. Pt does not appear to be in DKA. Nontoxic appearing. No abdominal pain. Denies nausea, vomiting, increased thirst, increased urination or shortness of breath. Given 10 units insulin which brought BG down to 222. Discussed with Dr. Maryan Rued who recommends case management consult for PCP scheduling. Pt is appropriate for close outpatient follow up with PCP and urology. Will add on bactrim for cellulitis/abscess MRSA coverage. Discussed importance of follow up with pt who states understanding. Given strict return precautions. Return precautions given in discharge paperwork and discussed with pt at bedside. Pt stable for discharge       Josephina Gip, PA-C 05/18/15 2150  Blanchie Dessert, MD 05/21/15 1626

## 2015-05-18 NOTE — ED Notes (Signed)
Pt c/o bleeding cyst on testicles onset 2 days ago. Pt seen in ED for the same yesterday.

## 2015-05-18 NOTE — Progress Notes (Addendum)
[facility] consulted for pcp needs regarding DM management.  Patient was recently seen bu day shift EDCM and provided multiple resources of which patient reports, "I don't know if I still have those."  Patient reports  He has never been treated for his diabetes, "I just come here."  [facility] discussed ED visits for emergency reasons and pcp to manage chronic conditions such as diabetes.  Patient verbalized understanding.  EDCM stressed the importance of finding a pcp.  EDCM received patient's permission to email [facility] in attempts to obtain an appointment.  Patient reports he has issues with transportation and would prefer an appointment on Thursday or Friday as his sister is off from work those days.  Patient is also interested in the orange card.  [facility] provided patient with contact information for [facility] for orange card information.  EDCM also informed patient if he is able to obtain an appointment at the [facility], to make an appointment to speak to the financial counselor at the [facility] to enroll for the orange card.  Patient verbalized understanding.  Patient provided phone number [phone] for his home phone number.  Patient lives wit his mother.  Patient reports he is hard of hearing.     Patient confirms he does not have a pcp or insurance living in [place].  [facility] provided patient with pamphlet to [facility], informed patient of services there.  EDCM also provided patient with list of pcps who accept self pay patients, list of discount pharmacies and websites needymeds.org and GoodRX.com for medication assistance, phone number to inquire about the orange card, phone number to inquire about [employer], phone number to inquire about the [place], financial resources in the community such as local churches, salvation army, urban ministries, and dental assistance for uninsured patients.  Patient thankful for resources.  No further EDCM needs at this time.  [facility] referral sent for the orange card.

## 2015-05-19 ENCOUNTER — Telehealth: Payer: Self-pay

## 2015-05-19 ENCOUNTER — Telehealth (HOSPITAL_BASED_OUTPATIENT_CLINIC_OR_DEPARTMENT_OTHER): Payer: Self-pay | Admitting: Emergency Medicine

## 2015-05-19 NOTE — Telephone Encounter (Deleted)
Post ED Visit - Positive Culture Follow-up: Successful Patient Follow-Up  Culture assessed and recommendations reviewed by: []  Elenor Quinones, Pharm.D. []  Heide Guile, Pharm.D., BCPS []  Parks Neptune, Pharm.D. []  Alycia Rossetti, Pharm.D., BCPS []  [place], Pharm.D., BCPS, AAHIVP []  Legrand Como, Pharm.D., BCPS, AAHIVP []  Milus Glazier, Pharm.D. []  Rob Evette Doffing, Pharm.D.  Positive *** culture  []  Patient discharged without antimicrobial prescription and treatment is now indicated []  Organism is resistant to prescribed ED discharge antimicrobial []  Patient with positive blood cultures  Changes discussed with ED provider: *** New antibiotic prescription *** Called to ***  Contacted patient, date ***, time ***   Hazle Nordmann 05/19/2015, 10:15 AM

## 2015-05-19 NOTE — Telephone Encounter (Signed)
Message received from Livia Snellen, RN CM requesting a hospital follow up appointment and an appointment for financial counseling.  Attempted to reach the patient at # [phone] and a HIPAA compliant voice mail message was left requesting a call back to # [phone] or [phone].   Update provided to A. Christen Bame, RN CM.

## 2015-05-19 NOTE — Telephone Encounter (Signed)
Post ED Visit - Positive Culture Follow-up  Culture report reviewed by antimicrobial stewardship pharmacist:  []  Elenor Quinones, Pharm.D. []  Heide Guile, Pharm.D., BCPS []  Parks Neptune, Pharm.D. []  Alycia Rossetti, Pharm.D., BCPS []  [place], Pharm.D., BCPS, AAHIVP []  Legrand Como, Pharm.D., BCPS, AAHIVP []  Milus Glazier, Pharm.D. []  Stephens November, Pharm.DGovernor Specking PharmD  Positive urine culture Treated with cephalexin, organism sensitive to the same and no further patient follow-up is required at this time.  Hazle Nordmann 05/19/2015, 10:13 AM

## 2015-05-20 ENCOUNTER — Telehealth: Payer: Self-pay

## 2015-05-20 NOTE — Telephone Encounter (Signed)
This Case Manager placed call to patient to schedule hospital follow-up appointment and to provide information about meeting with Financial Counselor as patient is uninsured. Patient confirmed he did not have a PCP and indicated he preferred an appointment on a Thursday or Friday as his sister can provide transportation on those days.  Appointment scheduled for 05/21/15 at 1400 with Zettie Pho, PA.    Informed patient of the Financial Counselor Walk-in times and informed patient of the documentation to bring. Patient verbalized understanding and indicated he preferred not to schedule a Financial Counselor appointment at this time because he was uncertain of his sister's availability. Encouraged patient to pick up Financial Assistance Application at his appointment on 05/21/15 and to come to clinic during [place] times. Patient verbalized understanding. No additional needs/concerns identified.

## 2015-05-20 NOTE — Telephone Encounter (Signed)
This Case Manager placed call to patient to schedule hospital follow-up appointment and to provide information about meeting with [facility]

## 2015-05-21 ENCOUNTER — Inpatient Hospital Stay: Payer: Self-pay

## 2015-05-28 ENCOUNTER — Ambulatory Visit: Payer: Self-pay | Attending: Physician Assistant | Admitting: Physician Assistant

## 2015-05-28 VITALS — BP 136/87 | HR 105 | Temp 98.0°F | Resp 18 | Ht 74.0 in | Wt 218.0 lb

## 2015-05-28 DIAGNOSIS — N454 Abscess of epididymis or testis: Secondary | ICD-10-CM

## 2015-05-28 DIAGNOSIS — I10 Essential (primary) hypertension: Secondary | ICD-10-CM | POA: Insufficient documentation

## 2015-05-28 DIAGNOSIS — Z7982 Long term (current) use of aspirin: Secondary | ICD-10-CM | POA: Insufficient documentation

## 2015-05-28 DIAGNOSIS — E119 Type 2 diabetes mellitus without complications: Secondary | ICD-10-CM | POA: Insufficient documentation

## 2015-05-28 DIAGNOSIS — Z7984 Long term (current) use of oral hypoglycemic drugs: Secondary | ICD-10-CM | POA: Insufficient documentation

## 2015-05-28 DIAGNOSIS — Z79899 Other long term (current) drug therapy: Secondary | ICD-10-CM | POA: Insufficient documentation

## 2015-05-28 LAB — GLUCOSE, POCT (MANUAL RESULT ENTRY): POC Glucose: 326 mg/dl — AB (ref 70–99)

## 2015-05-28 LAB — COMPREHENSIVE METABOLIC PANEL
ALBUMIN: 4.2 g/dL (ref 3.6–5.1)
ALK PHOS: 100 U/L (ref 40–115)
ALT: 35 U/L (ref 9–46)
AST: 24 U/L (ref 10–35)
BUN: 13 mg/dL (ref 7–25)
CALCIUM: 9.8 mg/dL (ref 8.6–10.3)
CO2: 22 mmol/L (ref 20–31)
Chloride: 95 mmol/L — ABNORMAL LOW (ref 98–110)
Creat: 0.84 mg/dL (ref 0.70–1.33)
GLUCOSE: 328 mg/dL — AB (ref 65–99)
POTASSIUM: 4.6 mmol/L (ref 3.5–5.3)
Sodium: 132 mmol/L — ABNORMAL LOW (ref 135–146)
TOTAL PROTEIN: 8.5 g/dL — AB (ref 6.1–8.1)
Total Bilirubin: 0.5 mg/dL (ref 0.2–1.2)

## 2015-05-28 LAB — POCT GLYCOSYLATED HEMOGLOBIN (HGB A1C): HEMOGLOBIN A1C: 13.7

## 2015-05-28 MED ORDER — GLUCOSE BLOOD VI STRP: ORAL_STRIP | Status: DC

## 2015-05-28 MED ORDER — TRUE METRIX METER W/DEVICE KIT: 1.0000 | PACK | Freq: Three times a day (TID) | Status: DC

## 2015-05-28 MED ORDER — METFORMIN HCL 1000 MG PO TABS: 1000.0000 mg | ORAL_TABLET | Freq: Two times a day (BID) | ORAL | Status: DC

## 2015-05-28 MED ORDER — LISINOPRIL 5 MG PO TABS: 5.0000 mg | ORAL_TABLET | Freq: Every day | ORAL | Status: DC

## 2015-05-28 MED ORDER — TRUEPLUS LANCETS 28G MISC
1.0000 | Freq: Three times a day (TID) | Status: DC
Start: 2015-05-28 — End: 2016-12-01

## 2015-05-28 MED FILL — TRUE METRIX BLOOD GLUCOSE M: W/DEVICE | 1 days supply | Qty: 1 | Fill #0

## 2015-05-28 MED FILL — TRUE METRIX TEST STRIP: 25 days supply | Qty: 100 | Fill #0

## 2015-05-28 MED FILL — TRUEplus LANCETS 28G MISC: 25 days supply | Qty: 100 | Fill #0

## 2015-05-28 NOTE — Patient Instructions (Signed)
Take blood sugars daily-fasting and at meal times and record the numbers for the next 2 weeks.  On the metformin I prescribed you, take 1/2 tablet twice daily for the first 3 days then increase to 1 tablet twice daily.    Blood Glucose Monitoring, Adult Monitoring your blood glucose (also know as blood sugar) helps you to manage your diabetes. It also helps you and your health care provider monitor your diabetes and determine how well your treatment plan is working. WHY SHOULD YOU MONITOR YOUR BLOOD GLUCOSE?  It can help you understand how food, exercise, and medicine affect your blood glucose.  It allows you to know what your blood glucose is at any given moment. You can quickly tell if you are having low blood glucose (hypoglycemia) or high blood glucose (hyperglycemia).  It can help you and your health care provider know how to adjust your medicines.  It can help you understand how to manage an illness or adjust medicine for exercise. WHEN SHOULD YOU TEST? Your health care provider will help you decide how often you should check your blood glucose. This may depend on the type of diabetes you have, your diabetes control, or the types of medicines you are taking. Be sure to write down all of your blood glucose readings so that this information can be reviewed with your health care provider. See below for examples of testing times that your health care provider may suggest. Type 1 Diabetes  Test at least 2 times per day if your diabetes is well controlled, if you are using an insulin pump, or if you perform multiple daily injections.  If your diabetes is not well controlled or if you are sick, you may need to test more often.  It is a good idea to also test:  Before every insulin injection.  Before and after exercise.  Between meals and 2 hours after a meal.  Occasionally between 2:00 a.m. and 3:00 a.m. Type 2 Diabetes  If you are taking insulin, test at least 2 times per day.  However, it is best to test before every insulin injection.  If you take medicines by mouth (orally), test 2 times a day.  If you are on a controlled diet, test once a day.  If your diabetes is not well controlled or if you are sick, you may need to monitor more often. HOW TO MONITOR YOUR BLOOD GLUCOSE Supplies Needed  Blood glucose meter.  Test strips for your meter. Each meter has its own strips. You must use the strips that go with your own meter.  A pricking needle (lancet).  A device that holds the lancet (lancing device).  A journal or log book to write down your results. Procedure  Wash your hands with soap and water. Alcohol is not preferred.  Prick the side of your finger (not the tip) with the lancet.  Gently milk the finger until a small drop of blood appears.  Follow the instructions that come with your meter for inserting the test strip, applying blood to the strip, and using your blood glucose meter. Other Areas to Get Blood for Testing Some meters allow you to use other areas of your body (other than your finger) to test your blood. These areas are called alternative sites. The most common alternative sites are:  The forearm.  The thigh.  The back area of the lower leg.  The palm of the hand. The blood flow in these areas is slower. Therefore, the blood glucose  values you get may be delayed, and the numbers are different from what you would get from your fingers. Do not use alternative sites if you think you are having hypoglycemia. Your reading will not be accurate. Always use a finger if you are having hypoglycemia. Also, if you cannot feel your lows (hypoglycemia unawareness), always use your fingers for your blood glucose checks. ADDITIONAL TIPS FOR GLUCOSE MONITORING  Do not reuse lancets.  Always carry your supplies with you.  All blood glucose meters have a 24-hour "hotline" number to call if you have questions or need help.  Adjust (calibrate)  your blood glucose meter with a control solution after finishing a few boxes of strips. BLOOD GLUCOSE RECORD KEEPING It is a good idea to keep a daily record or log of your blood glucose readings. Most glucose meters, if not all, keep your glucose records stored in the meter. Some meters come with the ability to download your records to your home computer. Keeping a record of your blood glucose readings is especially helpful if you are wanting to look for patterns. Make notes to go along with the blood glucose readings because you might forget what happened at that exact time. Keeping good records helps you and your health care provider to work together to achieve good diabetes management.    This information is not intended to replace advice given to you by your health care provider. Make sure you discuss any questions you have with your health care provider.   Document Released: 02/10/2003 Document Revised: 02/28/2014 Document Reviewed: 07/02/2012 Elsevier Interactive Patient Education [employer].

## 2015-05-28 NOTE — Progress Notes (Signed)
Shane Bond, is a 55 y.o. male  TYO:[id number]  FSF:[id number]  DOB - 05-Oct-1960  Chief Complaint  Patient presents with  . Follow-up    Tesiticular concern        Subjective:  Chief Complaint and HPI: Shane Bond is a 55 y.o. male here today to establish care and for a follow up visit after a recent trip to the ED for a testicular abscess and UTI.  He was also diagnosed with diabetes while there and given lantus but was not started on medication.  The patient says that he knew he was diabetic but has been managing his blood sugar with his diet and hasn't required medications.  He believes his blood sugars are out of control because of his recent testicular abscess. He says his testicular pain has resolved.  He continues to have a little drainage.  He denies f/c.  He no longer complains of UTI s/sx.  I reviewed his hospital notes and labs.  ROS:   Constitutional:  No f/c, No night sweats, No unexplained weight loss. EENT:  No vision changes, No blurry vision, No hearing changes. No mouth, throat, or ear problems.  Respiratory: No cough, No SOB Cardiac: No CP, no palpitations GI:  No abd pain, No N/V/D. GU: No Urinary s/sx Musculoskeletal: No joint pain Neuro: No headache, no dizziness, no motor weakness.  Skin: No rash Endocrine:  No polydipsia. No polyuria.  Psych: Denies SI/HI    ALLERGIES: No Known Allergies  PAST MEDICAL HISTORY: Past Medical History  Diagnosis Date  . CAD, NATIVE VESSEL 10/29/2009  . 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 OBESITY 10/22/2009  . Diabetes mellitus without complication ([place])     MEDICATIONS AT HOME: Prior to Admission medications   Medication Sig Start Date End Date Taking? Authorizing Provider  acetaminophen (TYLENOL) 500 MG tablet Take 1,000 mg by mouth every 6 (six) hours as needed for mild pain, moderate pain or headache.   Yes Historical Provider, MD  aspirin EC 81 MG tablet Take 81 mg by mouth daily.   Yes Historical Provider,  MD  Saw Palmetto, Serenoa repens, (SAW PALMETTO PO) Take 1 capsule by mouth daily.   Yes Historical Provider, MD  Blood Glucose Monitoring Suppl (TRUE METRIX METER) w/Device KIT 1 each by Does not apply route 3 (three) times daily. 05/28/15   Argentina Donovan, PA-C  cephALEXin (KEFLEX) 500 MG capsule Take 1 capsule (500 mg total) by mouth 4 (four) times daily. Patient not taking: Reported on 05/28/2015 05/15/15   Jeannett Senior, PA-C  glucose blood (TRUE METRIX BLOOD GLUCOSE TEST) test strip Use as instructed 05/28/15   Argentina Donovan, PA-C  lisinopril (PRINIVIL,ZESTRIL) 5 MG tablet Take 1 tablet (5 mg total) by mouth daily. 05/28/15   Argentina Donovan, PA-C  metFORMIN (GLUCOPHAGE) 1000 MG tablet Take 1 tablet (1,000 mg total) by mouth 2 (two) times daily with a meal. 05/28/15   Argentina Donovan, PA-C  ondansetron (ZOFRAN ODT) 8 MG disintegrating tablet Take 1 tablet (8 mg total) by mouth every 8 (eight) hours as needed for nausea or vomiting. Patient not taking: Reported on 05/17/2015 05/15/15   Tatyana Kirichenko, PA-C  TRUEPLUS LANCETS 28G MISC 1 each by Does not apply route 4 (four) times daily - after meals and at bedtime. 05/28/15   Argentina Donovan, PA-C     Objective:  EXAM:   Filed Vitals:   05/28/15 0935  BP: 136/87  Pulse: 105  Temp: 98 F (  36.7 C)  TempSrc: Oral  Resp: 18  Height: _0  (1.88 m)  Weight: 218 lb (98.884 kg)  SpO2: 95%    General appearance : A&OX3. NAD. Non-toxic-appearing HEENT: Atraumatic and Normocephalic.  PERRLA. EOM intact.  TM clear B. Mouth-MMM, post pharynx WNL w/o erythema, No PND. Neck: supple, no JVD. No cervical lymphadenopathy. No thyromegaly Chest/Lungs:  Breathing-non-labored, Good air entry bilaterally, breath sounds normal without rales, rhonchi, or wheezing  CVS: S1 S2 regular, no murmurs, gallops, rubs  Genitalia-uncircumsized male. Posterior aspect of the testicles with an almost closed area that is healing well where his abscess was.  There  is no erythema of the overlying skin and no re-accumulation that would indicate the abscess is returning.  Extremities: B/L Lower Ext shows no edema, both legs are warm to touch with = pulse throughout Neurology:  CN II-XII grossly intact, Non focal.   Psych:  TP linear. J/I WNL. Normal speech. Appropriate eye contact and affect.  Skin:  No Rash  Data Review Lab Results  Component Value Date   HGBA1C 13.7 05/28/2015     Assessment & Plan   1. Diabetes mellitus, new onset ([place]) I have had a lengthy discussion and provided education about insulin resistance and the intake of too much sugar/refined carbohydrates.  I have advised the patient to work at a goal of eliminating sugary drinks, candy, desserts, sweets, refined sugars, processed foods, and white carbohydrates.  The patient expresses understanding.   - POCT A1C - Glucose (CBG) - metFORMIN (GLUCOPHAGE) 1000 MG tablet; Take 1 tablet (1,000 mg total) by mouth 2 (two) times daily with a meal.  Dispense: 180 tablet; Refill: 3 Take 1/2 daily for the first few days and if he is tolerating it well, he can increase to 1 bid. Glucometer and accessories ordered-check CBG fasting and AC and record.  Appointment with Marzetta Board in 2 weeks for diabetic teaching and med review.  2. Essential hypertension Treat to goal of <130/85 and for kidney protection.  - lisinopril (PRINIVIL,ZESTRIL) 5 MG tablet; Take 1 tablet (5 mg total) by mouth daily.  Dispense: 90 tablet; Refill: 3 - Comprehensive metabolic panel  3. Testicular abscess-healed He has finished antibiotics   More than 50% of visit was spent face to face counseling regarding checking blood sugars, diet, and importance of diabetic and htn management.  Spent 45 mins face to face.   Patient have been counseled extensively about nutrition and exercise  Return in about 2 weeks (around 06/11/2015) for schedule with stacy for diabetes teaching and witth a PCP to establish care.  The patient was  given clear instructions to go to ER or return to medical center if symptoms don't improve, worsen or new problems develop. The patient verbalized understanding. The patient was told to call to get lab results if they haven't heard anything in the next week.     Freeman Caldron, PA-C [facility], [place]   05/28/2015, 10:16 AM

## 2015-05-28 NOTE — Progress Notes (Signed)
Patient is here for ED FU  Patient denies pain at this time.  Patient request refill on Palmetto.

## 2015-06-04 ENCOUNTER — Telehealth: Payer: Self-pay | Admitting: *Deleted

## 2015-06-04 NOTE — Telephone Encounter (Signed)
Medical Assistant left message on patient's home and cell voicemail. Voicemail states to give a call back to [place] with [facility] at [phone]. The office is closed tomorrow.

## 2015-06-04 NOTE — Telephone Encounter (Signed)
-----   Message from Shane Bond, [place] sent at 05/29/2015  9:58 AM EDT ----- Please call patient and let him know that his electrolytes are off a little bit but I expect all of this to improve as he takes his diabetes medications and works on a healthier diet and drinks more water.  His kidney and Liver function is normal.  We will recheck this at his next visit.   Thanks! Levada Dy

## 2015-06-18 ENCOUNTER — Ambulatory Visit: Payer: Self-pay | Attending: Family Medicine | Admitting: Family Medicine

## 2015-06-18 ENCOUNTER — Encounter: Payer: Self-pay | Admitting: Family Medicine

## 2015-06-18 VITALS — BP 101/64 | HR 87 | Temp 98.5°F | Resp 16 | Ht 72.0 in | Wt 220.0 lb

## 2015-06-18 DIAGNOSIS — Z79899 Other long term (current) drug therapy: Secondary | ICD-10-CM | POA: Insufficient documentation

## 2015-06-18 DIAGNOSIS — B351 Tinea unguium: Secondary | ICD-10-CM

## 2015-06-18 DIAGNOSIS — Z7984 Long term (current) use of oral hypoglycemic drugs: Secondary | ICD-10-CM | POA: Insufficient documentation

## 2015-06-18 DIAGNOSIS — Z7982 Long term (current) use of aspirin: Secondary | ICD-10-CM | POA: Insufficient documentation

## 2015-06-18 DIAGNOSIS — N400 Enlarged prostate without lower urinary tract symptoms: Secondary | ICD-10-CM | POA: Insufficient documentation

## 2015-06-18 DIAGNOSIS — E785 Hyperlipidemia, unspecified: Secondary | ICD-10-CM | POA: Insufficient documentation

## 2015-06-18 DIAGNOSIS — E782 Mixed hyperlipidemia: Secondary | ICD-10-CM | POA: Insufficient documentation

## 2015-06-18 DIAGNOSIS — E1169 Type 2 diabetes mellitus with other specified complication: Secondary | ICD-10-CM | POA: Insufficient documentation

## 2015-06-18 DIAGNOSIS — E1165 Type 2 diabetes mellitus with hyperglycemia: Secondary | ICD-10-CM | POA: Insufficient documentation

## 2015-06-18 DIAGNOSIS — Z789 Other specified health status: Secondary | ICD-10-CM

## 2015-06-18 DIAGNOSIS — Z1159 Encounter for screening for other viral diseases: Secondary | ICD-10-CM

## 2015-06-18 DIAGNOSIS — IMO0002 Reserved for concepts with insufficient information to code with codable children: Secondary | ICD-10-CM | POA: Insufficient documentation

## 2015-06-18 DIAGNOSIS — F17200 Nicotine dependence, unspecified, uncomplicated: Secondary | ICD-10-CM | POA: Insufficient documentation

## 2015-06-18 DIAGNOSIS — Z114 Encounter for screening for human immunodeficiency virus [HIV]: Secondary | ICD-10-CM

## 2015-06-18 DIAGNOSIS — I10 Essential (primary) hypertension: Secondary | ICD-10-CM | POA: Insufficient documentation

## 2015-06-18 LAB — GLUCOSE, POCT (MANUAL RESULT ENTRY): POC Glucose: 243 mg/dl — AB (ref 70–99)

## 2015-06-18 MED ORDER — INSULIN PEN NEEDLE 31G X 8 MM MISC: 1.0000 | Freq: Every day | Status: DC

## 2015-06-18 MED ORDER — TAMSULOSIN HCL 0.4 MG PO CAPS: 0.4000 mg | ORAL_CAPSULE | Freq: Every day | ORAL | Status: DC

## 2015-06-18 MED ORDER — INSULIN GLARGINE 100 UNIT/ML SOLOSTAR PEN: 10.0000 [IU] | PEN_INJECTOR | Freq: Every day | SUBCUTANEOUS | Status: DC

## 2015-06-18 MED FILL — !LANTUS SOLOSTAR 100UNITS/M: 100 | 30 days supply | Qty: 3 | Fill #0

## 2015-06-18 MED FILL — TAMSULOSIN HCL 0.4 MG CAP: 0.4 | 30 days supply | Qty: 30 | Fill #0

## 2015-06-18 NOTE — Progress Notes (Signed)
Subjective:  Patient ID: Shane Bond, male    DOB: 1960-12-13  Age: 55 y.o. MRN: [id number]  CC: Establish Care; Hypertension; and Diabetes   HPI [name]  Has hx of CAD, MI, HTN, HLD, Diabetes he presents for   1. CHRONIC DIABETES  Disease Monitoring  Blood Sugar Ranges: 150-350  Polyuria: no   Visual problems: yes   Medication Compliance: yes  Medication Side Effects  Hypoglycemia: no   Preventitive Health Care  Eye Exam: due   Foot Exam: done today   Diet pattern: high carb, pasta and bread   Exercise: minimal  2. Urinary retention: since his back surgery in 1998. He has to self-cath. Lately noticing cloudy urine with more difficulty inserting catheter. No blood in urine, dysuria, flank pain or fever.    Social History  Substance Use Topics  . Smoking status: Current Every Day Smoker  . Smokeless tobacco: Not on file  . Alcohol Use: No    Outpatient Prescriptions Prior to Visit  Medication Sig Dispense Refill  . aspirin EC 81 MG tablet Take 81 mg by mouth daily.    . Blood Glucose Monitoring Suppl (TRUE METRIX METER) w/Device KIT 1 each by Does not apply route 3 (three) times daily. 1 kit 0  . glucose blood (TRUE METRIX BLOOD GLUCOSE TEST) test strip Use as instructed 100 each 12  . lisinopril (PRINIVIL,ZESTRIL) 5 MG tablet Take 1 tablet (5 mg total) by mouth daily. 90 tablet 3  . metFORMIN (GLUCOPHAGE) 1000 MG tablet Take 1 tablet (1,000 mg total) by mouth 2 (two) times daily with a meal. 180 tablet 3  . TRUEPLUS LANCETS 28G MISC 1 each by Does not apply route 4 (four) times daily - after meals and at bedtime. 100 each 12  . cephALEXin (KEFLEX) 500 MG capsule Take 1 capsule (500 mg total) by mouth 4 (four) times daily. 40 capsule 0  . acetaminophen (TYLENOL) 500 MG tablet Take 1,000 mg by mouth every 6 (six) hours as needed for mild pain, moderate pain or headache. Reported on 06/18/2015    . ondansetron (ZOFRAN ODT) 8 MG disintegrating tablet Take 1  tablet (8 mg total) by mouth every 8 (eight) hours as needed for nausea or vomiting. (Patient not taking: Reported on 05/17/2015) 10 tablet 0  . Saw Palmetto, Serenoa repens, (SAW PALMETTO PO) Take 1 capsule by mouth daily.     No facility-administered medications prior to visit.    ROS Review of Systems  Constitutional: Negative for fever, chills, fatigue and unexpected weight change.  Eyes: Positive for visual disturbance.  Respiratory: Negative for cough and shortness of breath.   Cardiovascular: Negative for chest pain, palpitations and leg swelling.  Gastrointestinal: Negative for nausea, vomiting, abdominal pain, diarrhea, constipation and blood in stool.  Endocrine: Negative for polydipsia, polyphagia and polyuria.  Genitourinary: Positive for difficulty urinating. Negative for dysuria.  Musculoskeletal: Positive for back pain. Negative for myalgias, arthralgias, gait problem and neck pain.  Skin: Negative for rash.  Allergic/Immunologic: Negative for immunocompromised state.  Hematological: Negative for adenopathy. Does not bruise/bleed easily.  Psychiatric/Behavioral: Negative for suicidal ideas, sleep disturbance and dysphoric mood. The patient is not nervous/anxious.     Objective:  BP 101/64 mmHg  Pulse 87  Temp(Src) 98.5 F (36.9 C) (Oral)  Resp 16  Ht 6' (1.829 m)  Wt 220 lb (99.791 kg)  BMI 29.83 kg/m2  SpO2 97%  BP/Weight 06/18/2015 05/28/2015 08/17/9483  Systolic BP 462 703 500  Diastolic  BP 64 87 91  Wt. (Lbs) 220 218 -  BMI 29.83 27.98 -    Physical Exam  Constitutional: He appears well-developed and well-nourished. No distress.  HENT:  Head: Normocephalic and atraumatic.  Neck: Normal range of motion. Neck supple.  Cardiovascular: Normal rate, regular rhythm, normal heart sounds and intact distal pulses.   Pulmonary/Chest: Effort normal and breath sounds normal.  Genitourinary: Prostate is enlarged. Prostate is not tender.  Musculoskeletal: He exhibits  no edema.  Neurological: He is alert.  Skin: Skin is warm and dry. No rash noted. No erythema.     Psychiatric: He has a normal mood and affect.    CBG 243 Lab Results  Component Value Date   HGBA1C 13.7 05/28/2015    Assessment & Plan:   [name] was seen today for establish care, hypertension and diabetes.  Diagnoses and all orders for this visit:  Onychomycosis of toenail -     Ambulatory referral to Podiatry  Uncontrolled type 2 diabetes mellitus with hyperglycemia, without long-term current use of insulin (HCC) -     POCT glucose (manual entry) -     Insulin Glargine (LANTUS SOLOSTAR) 100 UNIT/ML Solostar Pen; Inject 10 Units into the skin daily at 10 pm. -     Insulin Pen Needle (PX SHORTLENGTH PEN NEEDLES) 31G X 8 MM MISC; 1 each by Does not apply route daily. -     Ambulatory referral to Ophthalmology -     Amb Referral to Nutrition and Diabetic E  Hyperlipidemia -     Lipid Panel  Screening for HIV (human immunodeficiency virus) -     HIV antibody (with reflex)  Need for hepatitis C screening test -     Hepatitis C antibody, reflex  BPH (benign prostatic hyperplasia) -     tamsulosin (FLOMAX) 0.4 MG CAPS capsule; Take 1 capsule (0.4 mg total) by mouth daily.    No orders of the defined types were placed in this encounter.    Follow-up: No Follow-up on file.   [name] MD

## 2015-06-18 NOTE — Addendum Note (Signed)
Addended by: [name] on: 06/18/2015 12:43 PM   Modules accepted: Orders, SmartSet

## 2015-06-18 NOTE — Progress Notes (Signed)
F/U DM HTN Taking medication as prescribed  Glucose running 158-474 E-cigarette user  No suicidal thought in the past two weeks

## 2015-06-18 NOTE — Assessment & Plan Note (Signed)
Enlarged prostate on exam flomax ordered

## 2015-06-18 NOTE — Assessment & Plan Note (Signed)
Uncontrolled diabetes with unintentional weight loss  Plan Add basal insulin, lantus 10 U  Continue metformin Diabetes education  Low carb Continuing monitorig

## 2015-06-18 NOTE — Patient Instructions (Addendum)
Shane Bond was seen today for establish care, hypertension and diabetes.  Diagnoses and all orders for this visit:  Onychomycosis of toenail -     Ambulatory referral to Podiatry  Uncontrolled type 2 diabetes mellitus with hyperglycemia, without long-term current use of insulin (HCC) -     POCT glucose (manual entry) -     Insulin Glargine (LANTUS SOLOSTAR) 100 UNIT/ML Solostar Pen; Inject 10 Units into the skin daily at 10 pm. -     Insulin Pen Needle (PX SHORTLENGTH PEN NEEDLES) 31G X 8 MM MISC; 1 each by Does not apply route daily. -     Ambulatory referral to Ophthalmology -     Amb Referral to Nutrition and Diabetic E  Hyperlipidemia  Screening for HIV (human immunodeficiency virus) -     HIV antibody (with reflex)  Need for hepatitis C screening test -     Hepatitis C antibody, reflex  BPH (benign prostatic hyperplasia) -     tamsulosin (FLOMAX) 0.4 MG CAPS capsule; Take 1 capsule (0.4 mg total) by mouth daily.   Diabetes blood sugar goals  Fasting (in AM before breakfast, 8 hrs of no eating or drinking (except water or unsweetened coffee or tea): 90-110 2 hrs after meals: < 160,   No low sugars: nothing < 70   Get on podiatry clinic schedule to clip toenails  F/u in 6 weeks for diabetes   Dr. Adrian Blackwater   Basic Carbohydrate Counting for Diabetes Mellitus Carbohydrate counting is a method for keeping track of the amount of carbohydrates you eat. Eating carbohydrates naturally increases the level of sugar (glucose) in your blood, so it is important for you to know the amount that is okay for you to have in every meal. Carbohydrate counting helps keep the level of glucose in your blood within normal limits. The amount of carbohydrates allowed is different for every person. A dietitian can help you calculate the amount that is right for you. Once you know the amount of carbohydrates you can have, you can count the carbohydrates in the foods you want to eat. Carbohydrates are found  in the following foods:  Grains, such as breads and cereals.  Dried beans and soy products.  Starchy vegetables, such as potatoes, peas, and corn.  Fruit and fruit juices.  Milk and yogurt.  Sweets and snack foods, such as cake, cookies, candy, chips, soft drinks, and fruit drinks. CARBOHYDRATE COUNTING There are two ways to count the carbohydrates in your food. You can use either of the methods or a combination of both. Reading the "Nutrition Facts" on [place] The "Nutrition Facts" is an area that is included on the labels of almost all packaged food and beverages in the [place]. It includes the serving size of that food or beverage and information about the nutrients in each serving of the food, including the grams (g) of carbohydrate per serving.  Decide the number of servings of this food or beverage that you will be able to eat or drink. Multiply that number of servings by the number of grams of carbohydrate that is listed on the label for that serving. The total will be the amount of carbohydrates you will be having when you eat or drink this food or beverage. Learning Standard Serving Sizes of Food When you eat food that is not packaged or does not include "Nutrition Facts" on the label, you need to measure the servings in order to count the amount of carbohydrates.A serving of most  carbohydrate-rich foods contains about 15 g of carbohydrates. The following list includes serving sizes of carbohydrate-rich foods that provide 15 g ofcarbohydrate per serving:   1 slice of bread (1 oz) or 1 six-inch tortilla.    of a hamburger bun or English muffin.  4-6 crackers.   cup unsweetened dry cereal.    cup hot cereal.   cup rice or pasta.    cup mashed potatoes or  of a large baked potato.  1 cup fresh fruit or one small piece of fruit.    cup canned or frozen fruit or fruit juice.  1 cup milk.   cup plain fat-free yogurt or yogurt sweetened with  artificial sweeteners.   cup cooked dried beans or starchy vegetable, such as peas, corn, or potatoes.  Decide the number of standard-size servings that you will eat. Multiply that number of servings by 15 (the grams of carbohydrates in that serving). For example, if you eat 2 cups of strawberries, you will have eaten 2 servings and 30 g of carbohydrates (2 servings x 15 g = 30 g). For foods such as soups and casseroles, in which more than one food is mixed in, you will need to count the carbohydrates in each food that is included. EXAMPLE OF CARBOHYDRATE COUNTING Sample Dinner  3 oz chicken breast.   cup of brown rice.   cup of corn.  1 cup milk.   1 cup strawberries with sugar-free whipped topping.  Carbohydrate Calculation Step 1: Identify the foods that contain carbohydrates:   Rice.   Corn.   Milk.   Strawberries. Step 2:Calculate the number of servings eaten of each:   2 servings of rice.   1 serving of corn.   1 serving of milk.   1 serving of strawberries. Step 3: Multiply each of those number of servings by 15 g:   2 servings of rice x 15 g = 30 g.   1 serving of corn x 15 g = 15 g.   1 serving of milk x 15 g = 15 g.   1 serving of strawberries x 15 g = 15 g. Step 4: Add together all of the amounts to find the total grams of carbohydrates eaten: 30 g + 15 g + 15 g + 15 g = 75 g.   This information is not intended to replace advice given to you by your health care provider. Make sure you discuss any questions you have with your health care provider.   Document Released: 02/07/2005 Document Revised: 02/28/2014 Document Reviewed: 01/04/2013 Elsevier Interactive Patient Education [employer].

## 2015-06-19 ENCOUNTER — Telehealth: Payer: Self-pay | Admitting: Family Medicine

## 2015-06-19 LAB — LIPID PANEL
CHOL/HDL RATIO: 5.1 ratio — AB (ref <=5.0)
CHOLESTEROL: 168 mg/dL (ref 125–200)
HDL: 33 mg/dL — ABNORMAL LOW (ref >=40)
LDL Cholesterol: 103 mg/dL (ref <130)
Triglycerides: 162 mg/dL — ABNORMAL HIGH (ref <150)
VLDL: 32 mg/dL — AB (ref <30)

## 2015-06-19 LAB — HEPATITIS C ANTIBODY: HCV Ab: NEGATIVE

## 2015-06-19 LAB — HIV ANTIBODY (ROUTINE TESTING W REFLEX): HIV 1&2 Ab, 4th Generation: NONREACTIVE

## 2015-06-19 MED ORDER — ATORVASTATIN CALCIUM 40 MG PO TABS: 40.0000 mg | ORAL_TABLET | Freq: Every day | ORAL | Status: DC

## 2015-06-19 MED FILL — TRUE METRIX GLUCOSE TEST ST: 25 days supply | Qty: 100 | Fill #1

## 2015-06-19 MED FILL — ATORVASTATIN 40 MG TABLET: 40 | 30 days supply | Qty: 30 | Fill #0

## 2015-06-19 NOTE — Addendum Note (Signed)
Addended by: [name] on: 06/19/2015 08:32 AM   Modules accepted: Orders, SmartSet

## 2015-06-19 NOTE — Telephone Encounter (Signed)
Patient is requesting refill on his test strips...Shane Kitchenhe is almost out.Shane KitchenMarland KitchenMarland Bond

## 2015-06-22 NOTE — Telephone Encounter (Signed)
-----   Message from [name], MD sent at 06/19/2015  8:31 AM EDT ----- Screening HIV and Hep C negative Patient at higher risk for CV disease due to diabetes and LDL > 70,  please start lipitor 40 mg daily to reduce risk

## 2015-06-22 NOTE — Telephone Encounter (Signed)
LVM to return call.

## 2015-06-23 ENCOUNTER — Other Ambulatory Visit: Payer: Self-pay | Admitting: *Deleted

## 2015-06-23 DIAGNOSIS — E1165 Type 2 diabetes mellitus with hyperglycemia: Secondary | ICD-10-CM

## 2015-06-23 MED ORDER — INSULIN GLARGINE 100 UNIT/ML SOLOSTAR PEN: 10.0000 [IU] | PEN_INJECTOR | Freq: Every day | SUBCUTANEOUS | Status: DC

## 2015-06-24 NOTE — Telephone Encounter (Signed)
LVM to return call x2  Communication letter mail to pt  To address provider on chart

## 2015-06-26 ENCOUNTER — Telehealth: Payer: Self-pay | Admitting: Family Medicine

## 2015-06-26 ENCOUNTER — Ambulatory Visit: Payer: Self-pay

## 2015-06-26 NOTE — Telephone Encounter (Signed)
Mr Shane Bond is calling  Returning his brother call . Please, call her back thank you

## 2015-07-16 MED FILL — $LANTUS SOLOSTAR 100 UNITS/: 100 | 89 days supply | Qty: 9 | Fill #1

## 2015-07-23 MED FILL — ?TAMSULOSIN HCL 0.4 MG CAP: 0.4 | 30 days supply | Qty: 30 | Fill #1

## 2015-07-31 ENCOUNTER — Ambulatory Visit: Payer: Self-pay | Admitting: Dietician

## 2015-08-28 ENCOUNTER — Telehealth: Payer: Self-pay | Admitting: Family Medicine

## 2015-08-28 DIAGNOSIS — E119 Type 2 diabetes mellitus without complications: Secondary | ICD-10-CM

## 2015-08-28 MED FILL — ULTICARE PEN NDL 4MM 32G: 32G X 4 MM | 25 days supply | Qty: 100 | Fill #0

## 2015-08-28 MED FILL — ?TAMSULOSIN HCL 0.4 MG CAP: 0.4 | 30 days supply | Qty: 30 | Fill #2

## 2015-08-28 NOTE — Telephone Encounter (Signed)
Patient would like to know if he should still take metformin. Please follow up.

## 2015-08-31 NOTE — Telephone Encounter (Signed)
Yes, he should.

## 2015-09-01 NOTE — Telephone Encounter (Signed)
Contacted pt and lvm informing patient that per Dr. Adrian Blackwater he does still need to be taking his metformin and if he has any questions or concerns to give [place] a call

## 2015-09-17 MED FILL — $LANTUS SOLOSTAR 100 UNITS/: 100 | 89 days supply | Qty: 9 | Fill #2

## 2015-09-17 MED FILL — ?TAMSULOSIN HCL 0.4 MG CAP: 0.4 | 30 days supply | Qty: 30 | Fill #3

## 2015-09-18 ENCOUNTER — Ambulatory Visit: Payer: Self-pay | Admitting: Family Medicine

## 2015-10-08 ENCOUNTER — Encounter: Payer: Self-pay | Admitting: Family Medicine

## 2015-10-08 ENCOUNTER — Ambulatory Visit: Payer: Self-pay | Attending: Family Medicine | Admitting: Family Medicine

## 2015-10-08 VITALS — BP 111/81 | HR 87 | Temp 98.8°F | Ht 72.0 in | Wt 233.2 lb

## 2015-10-08 DIAGNOSIS — Z0000 Encounter for general adult medical examination without abnormal findings: Secondary | ICD-10-CM

## 2015-10-08 DIAGNOSIS — I10 Essential (primary) hypertension: Secondary | ICD-10-CM

## 2015-10-08 DIAGNOSIS — Z87891 Personal history of nicotine dependence: Secondary | ICD-10-CM | POA: Insufficient documentation

## 2015-10-08 DIAGNOSIS — E785 Hyperlipidemia, unspecified: Secondary | ICD-10-CM

## 2015-10-08 DIAGNOSIS — Z7982 Long term (current) use of aspirin: Secondary | ICD-10-CM | POA: Insufficient documentation

## 2015-10-08 DIAGNOSIS — N400 Enlarged prostate without lower urinary tract symptoms: Secondary | ICD-10-CM

## 2015-10-08 DIAGNOSIS — Z794 Long term (current) use of insulin: Secondary | ICD-10-CM | POA: Insufficient documentation

## 2015-10-08 DIAGNOSIS — Z79899 Other long term (current) drug therapy: Secondary | ICD-10-CM | POA: Insufficient documentation

## 2015-10-08 DIAGNOSIS — E1165 Type 2 diabetes mellitus with hyperglycemia: Secondary | ICD-10-CM

## 2015-10-08 LAB — GLUCOSE, POCT (MANUAL RESULT ENTRY): POC Glucose: 231 mg/dl — AB (ref 70–99)

## 2015-10-08 LAB — POCT GLYCOSYLATED HEMOGLOBIN (HGB A1C): HEMOGLOBIN A1C: 8.6

## 2015-10-08 MED ORDER — LISINOPRIL 5 MG PO TABS: 5.0000 mg | ORAL_TABLET | Freq: Every day | ORAL | 3 refills | Status: DC

## 2015-10-08 MED ORDER — METFORMIN HCL 1000 MG PO TABS: 1000.0000 mg | ORAL_TABLET | Freq: Two times a day (BID) | ORAL | 3 refills | Status: DC

## 2015-10-08 MED ORDER — TAMSULOSIN HCL 0.4 MG PO CAPS: 0.8000 mg | ORAL_CAPSULE | Freq: Every day | ORAL | 3 refills | Status: DC

## 2015-10-08 MED FILL — ?LISINOPRIL 5 MG TABLET: 5 | 30 days supply | Qty: 30 | Fill #0

## 2015-10-08 MED FILL — ?TAMSULOSIN HCL 0.4 MG CAP: 0.4 | 30 days supply | Qty: 60 | Fill #0

## 2015-10-08 MED FILL — metFORMIN HCL 1000 MG TABS: 1000 | 30 days supply | Qty: 60 | Fill #0

## 2015-10-08 NOTE — Assessment & Plan Note (Signed)
Restart lipitor 40 mg daily

## 2015-10-08 NOTE — Patient Instructions (Addendum)
Shane Bond was seen today for diabetes.  Diagnoses and all orders for this visit:  Healthcare maintenance -     Flu Vaccine QUAD 36+ mos IM  Uncontrolled type 2 diabetes mellitus with hyperglycemia, with long-term current use of insulin (HCC) -     Glucose (CBG) -     HgB A1c -     metFORMIN (GLUCOPHAGE) 1000 MG tablet; Take 1 tablet (1,000 mg total) by mouth 2 (two) times daily with a meal.  BPH (benign prostatic hyperplasia) -     tamsulosin (FLOMAX) 0.4 MG CAPS capsule; Take 2 capsules (0.8 mg total) by mouth daily.  Essential hypertension -     lisinopril (PRINIVIL,ZESTRIL) 5 MG tablet; Take 1 tablet (5 mg total) by mouth daily.   Please apply for [place] discount and orange card, you can also inquire if any of your medications are on the PASS (medications assistance) list.   F/u in 3 months for diabetes   Dr. Adrian Blackwater

## 2015-10-08 NOTE — Progress Notes (Signed)
Subjective:  Patient ID: Shane Bond, male    DOB: [birth year]  Age: 55 y.o. MRN: [id number]  CC: Diabetes   HPI JONAN SEUFERT has hx of CAD, HTN, HLD he presents with his sister  for    1. CHRONIC DIABETES  Disease Monitoring  Blood Sugar Ranges: not checking lately   Polyuria: no   Visual problems: no   Medication Compliance: no, not taking metformin or lipitor  Medication Side Effects  Hypoglycemia: no   Preventitive Health Care  Eye Exam: due   Foot Exam: done today   Diet pattern: eating low sugar   Exercise: diet   2. HTN: taking lisinopril. No cough,  HA, CP, SOB or vision changes. Smoking e-cig.   Social History  Substance Use Topics  . Smoking status: Former Research scientist (life sciences)  . Smokeless tobacco: Former Systems developer  . Alcohol use No    Outpatient Medications Prior to Visit  Medication Sig Dispense Refill  . acetaminophen (TYLENOL) 500 MG tablet Take 1,000 mg by mouth every 6 (six) hours as needed for mild pain, moderate pain or headache. Reported on 06/18/2015    . aspirin EC 81 MG tablet Take 81 mg by mouth daily.    . Blood Glucose Monitoring Suppl (TRUE METRIX METER) w/Device KIT 1 each by Does not apply route 3 (three) times daily. 1 kit 0  . glucose blood (TRUE METRIX BLOOD GLUCOSE TEST) test strip Use as instructed 100 each 12  . Insulin Glargine (LANTUS SOLOSTAR) 100 UNIT/ML Solostar Pen Inject 10 Units into the skin daily at 10 pm. 5 pen 3  . Insulin Pen Needle (PX SHORTLENGTH PEN NEEDLES) 31G X 8 MM MISC 1 each by Does not apply route daily. 100 each 8  . TRUEPLUS LANCETS 28G MISC 1 each by Does not apply route 4 (four) times daily - after meals and at bedtime. 100 each 12  . atorvastatin (LIPITOR) 40 MG tablet Take 1 tablet (40 mg total) by mouth daily. (Patient not taking: Reported on 10/08/2015) 90 tablet 3  . lisinopril (PRINIVIL,ZESTRIL) 5 MG tablet Take 1 tablet (5 mg total) by mouth daily. 90 tablet 3  . metFORMIN (GLUCOPHAGE) 1000 MG tablet Take 1 tablet  (1,000 mg total) by mouth 2 (two) times daily with a meal. 180 tablet 3  . tamsulosin (FLOMAX) 0.4 MG CAPS capsule Take 1 capsule (0.4 mg total) by mouth daily. (Patient not taking: Reported on 10/08/2015) 30 capsule 3   No facility-administered medications prior to visit.     ROS Review of Systems  Constitutional: Negative for chills, fatigue, fever and unexpected weight change.  Eyes: Positive for visual disturbance.  Respiratory: Negative for cough and shortness of breath.   Cardiovascular: Negative for chest pain, palpitations and leg swelling.  Gastrointestinal: Negative for abdominal pain, blood in stool, constipation, diarrhea, nausea and vomiting.  Endocrine: Negative for polydipsia, polyphagia and polyuria.  Genitourinary: Positive for difficulty urinating (has to self cath ). Negative for dysuria.  Musculoskeletal: Positive for back pain. Negative for arthralgias, gait problem, myalgias and neck pain.  Skin: Negative for rash.  Allergic/Immunologic: Negative for immunocompromised state.  Hematological: Negative for adenopathy. Does not bruise/bleed easily.  Psychiatric/Behavioral: Negative for dysphoric mood, sleep disturbance and suicidal ideas. The patient is not nervous/anxious.     Objective:  BP 111/81 (BP Location: Left Arm, Patient Position: Sitting, Cuff Size: Large)   Pulse 87   Temp 98.8 F (37.1 C) (Oral)   Ht 6' (1.829 m)  Wt 233 lb 3.2 oz (105.8 kg)   SpO2 96%   BMI 31.63 kg/m   BP/Weight 10/08/2015 8/72/1587 03/30/6182  Systolic BP 859 276 394  Diastolic BP 81 64 87  Wt. (Lbs) 233.2 220 218  BMI 31.63 29.83 27.98    Physical Exam  Constitutional: He appears well-developed and well-nourished. No distress.  HENT:  Head: Normocephalic and atraumatic.  Neck: Normal range of motion. Neck supple.  Cardiovascular: Normal rate, regular rhythm, normal heart sounds and intact distal pulses.   Pulmonary/Chest: Effort normal and breath sounds normal.    Musculoskeletal: He exhibits no edema.  Neurological: He is alert.  Skin: Skin is warm and dry. No rash noted. No erythema.     Psychiatric: He has a normal mood and affect.   Lab Results  Component Value Date   HGBA1C 13.7 05/28/2015   Lab Results  Component Value Date   HGBA1C 8.6 10/08/2015    CBG 231  Assessment & Plan:   Nahom was seen today for diabetes.  Diagnoses and all orders for this visit:  Healthcare maintenance -     Flu Vaccine QUAD 36+ mos IM  Uncontrolled type 2 diabetes mellitus with hyperglycemia, with long-term current use of insulin (HCC) -     Glucose (CBG) -     HgB A1c -     metFORMIN (GLUCOPHAGE) 1000 MG tablet; Take 1 tablet (1,000 mg total) by mouth 2 (two) times daily with a meal.  BPH (benign prostatic hyperplasia) -     tamsulosin (FLOMAX) 0.4 MG CAPS capsule; Take 2 capsules (0.8 mg total) by mouth daily.  Essential hypertension -     lisinopril (PRINIVIL,ZESTRIL) 5 MG tablet; Take 1 tablet (5 mg total) by mouth daily.   No orders of the defined types were placed in this encounter.   Follow-up: No Follow-up on file.   [name] MD

## 2015-10-08 NOTE — Assessment & Plan Note (Signed)
Controlled. Continue lisinopril 5 mg daily

## 2015-10-08 NOTE — Assessment & Plan Note (Signed)
Improved with lantus   Plan to add back metformin 1000 mg BID Continue lantus 10 U daily

## 2015-10-23 ENCOUNTER — Other Ambulatory Visit: Payer: Self-pay

## 2015-10-23 DIAGNOSIS — T7840XA Allergy, unspecified, initial encounter: Secondary | ICD-10-CM

## 2015-10-23 MED ORDER — DIPHENHYDRAMINE HCL 25 MG PO CAPS: 25.0000 mg | ORAL_CAPSULE | Freq: Once | ORAL | Status: DC

## 2015-10-23 NOTE — Progress Notes (Signed)
Patient presents with possible allergic reaction to he thinks Gen. Lipitor although he just started Lisinopril also.  Started lipitor one week ago and states he had similar reaction in the past. Urticaria noted BUE, left worse than right noted; had for 2 -3 days. Appears to be in no acute distress. Denies SOB, CP; R 20, BP 118/78, P78, T 98.2.  Advised to discontinue the Lipitor.  Take Benadryl 25mg  q 6hour.  Patient advised to follow up PCP.   Priscille Heidelberg, RN, BSN

## 2015-10-23 NOTE — Progress Notes (Signed)
Agree with assessment and plan 

## 2015-11-02 ENCOUNTER — Encounter: Payer: Self-pay | Admitting: Family Medicine

## 2015-11-02 ENCOUNTER — Ambulatory Visit: Payer: Self-pay | Attending: Family Medicine | Admitting: Family Medicine

## 2015-11-02 VITALS — BP 111/77 | HR 82 | Temp 97.7°F | Wt 240.4 lb

## 2015-11-02 DIAGNOSIS — E1165 Type 2 diabetes mellitus with hyperglycemia: Secondary | ICD-10-CM | POA: Insufficient documentation

## 2015-11-02 DIAGNOSIS — Z87891 Personal history of nicotine dependence: Secondary | ICD-10-CM | POA: Insufficient documentation

## 2015-11-02 DIAGNOSIS — I2510 Atherosclerotic heart disease of native coronary artery without angina pectoris: Secondary | ICD-10-CM | POA: Insufficient documentation

## 2015-11-02 DIAGNOSIS — E785 Hyperlipidemia, unspecified: Secondary | ICD-10-CM | POA: Insufficient documentation

## 2015-11-02 DIAGNOSIS — Z79899 Other long term (current) drug therapy: Secondary | ICD-10-CM | POA: Insufficient documentation

## 2015-11-02 DIAGNOSIS — Z7982 Long term (current) use of aspirin: Secondary | ICD-10-CM | POA: Insufficient documentation

## 2015-11-02 DIAGNOSIS — Z794 Long term (current) use of insulin: Secondary | ICD-10-CM | POA: Insufficient documentation

## 2015-11-02 LAB — GLUCOSE, POCT (MANUAL RESULT ENTRY)
POC GLUCOSE: 259 mg/dL — AB (ref 70–99)
POC Glucose: 297 mg/dl — AB (ref 70–99)

## 2015-11-02 MED ORDER — OMEGA-3-ACID ETHYL ESTERS 1 G PO CAPS: 2.0000 g | ORAL_CAPSULE | Freq: Two times a day (BID) | ORAL | 3 refills | Status: DC

## 2015-11-02 MED ORDER — GLIPIZIDE 5 MG PO TABS: 5.0000 mg | ORAL_TABLET | Freq: Two times a day (BID) | ORAL | 2 refills | Status: DC

## 2015-11-02 MED ORDER — INSULIN GLARGINE 100 UNIT/ML SOLOSTAR PEN: 15.0000 [IU] | PEN_INJECTOR | Freq: Every day | SUBCUTANEOUS | 3 refills | Status: DC

## 2015-11-02 MED ORDER — INSULIN ASPART 100 UNIT/ML ~~LOC~~ SOLN
10.0000 [IU] | Freq: Once | SUBCUTANEOUS | Status: AC
Start: 2015-11-02 — End: 2015-11-02
  Administered 2015-11-02: 10 [IU] via SUBCUTANEOUS

## 2015-11-02 MED ORDER — METFORMIN HCL 1000 MG PO TABS: 1000.0000 mg | ORAL_TABLET | Freq: Two times a day (BID) | ORAL | 3 refills | Status: DC

## 2015-11-02 MED FILL — metFORMIN HCL 1000 MG TABS: 1000 | 60 days supply | Qty: 60 | Fill #0

## 2015-11-02 MED FILL — $LANTUS SOLOSTAR 100 UNITS/: 100 | 30 days supply | Qty: 6 | Fill #0

## 2015-11-02 MED FILL — glipiZIDE 5 MG TABS: 5 | 30 days supply | Qty: 60 | Fill #0

## 2015-11-02 NOTE — Assessment & Plan Note (Signed)
Uncontrolled with hyperglycemia  Plan: Increase lantus to 15 U daily Continue metformin 1000 mg BID Add glipizide 5 mg BID F/u in 2 months

## 2015-11-02 NOTE — Assessment & Plan Note (Addendum)
HLD with DM2 and HTN as well as CAD Rash with statins  Plan: Daily aspirin  Add lovaza

## 2015-11-02 NOTE — Patient Instructions (Addendum)
Shane Bond was seen today for medication reaction.  Diagnoses and all orders for this visit:  Uncontrolled type 2 diabetes mellitus with hyperglycemia, with long-term current use of insulin (HCC) -     POCT glucose (manual entry) -     insulin aspart (novoLOG) injection 10 Units; Inject 0.1 mLs (10 Units total) into the skin once. -     metFORMIN (GLUCOPHAGE) 1000 MG tablet; Take 1 tablet (1,000 mg total) by mouth 2 (two) times daily with a meal. -     glipiZIDE (GLUCOTROL) 5 MG tablet; Take 1 tablet (5 mg total) by mouth 2 (two) times daily before a meal. -     Insulin Glargine (LANTUS SOLOSTAR) 100 UNIT/ML Solostar Pen; Inject 15 Units into the skin daily at 10 pm. -     Glucose (CBG) -     omega-3 acid ethyl esters (LOVAZA) 1 g capsule; Take 2 capsules (2 g total) by mouth 2 (two) times daily.  Hyperlipidemia -     omega-3 acid ethyl esters (LOVAZA) 1 g capsule; Take 2 capsules (2 g total) by mouth 2 (two) times daily.  Atherosclerosis of native coronary artery of native heart without angina pectoris -     omega-3 acid ethyl esters (LOVAZA) 1 g capsule; Take 2 capsules (2 g total) by mouth 2 (two) times daily.   Diabetes blood sugar goals  Fasting (in AM before breakfast, 8 hrs of no eating or drinking (except water or unsweetened coffee or tea): 90-110 2 hrs after meals: < 160,   No low sugars: nothing < 70   F/u in 2 months for diabetes   Dr. Adrian Blackwater

## 2015-11-02 NOTE — Progress Notes (Signed)
Rash due to taking metformin

## 2015-11-02 NOTE — Progress Notes (Signed)
Subjective:  Patient ID: Shane Bond, male    DOB: December 09, 1960  Age: 55 y.o. MRN: [id number]  CC: Medication Reaction   HPI Shane Bond presents for   1. Med reaction: rash while taking lipitor Rash resolved after 3-4 days after stopping lipitor. Red rash on arms. Red circles. No swelling in lips or mouth.   2. DM2: he is taking lantus 10 U daily. He eats late at night. He eats 2-3 meals per day. He is not exercising due to back pains. He eats low sugar. He ate 2 bologna and cheese  sandwiches this morning.   Social History  Substance Use Topics  . Smoking status: Former Research scientist (life sciences)  . Smokeless tobacco: Former Systems developer  . Alcohol use No    Outpatient Medications Prior to Visit  Medication Sig Dispense Refill  . acetaminophen (TYLENOL) 500 MG tablet Take 1,000 mg by mouth every 6 (six) hours as needed for mild pain, moderate pain or headache. Reported on 06/18/2015    . aspirin EC 81 MG tablet Take 81 mg by mouth daily.    . Blood Glucose Monitoring Suppl (TRUE METRIX METER) w/Device KIT 1 each by Does not apply route 3 (three) times daily. 1 kit 0  . glucose blood (TRUE METRIX BLOOD GLUCOSE TEST) test strip Use as instructed 100 each 12  . Insulin Glargine (LANTUS SOLOSTAR) 100 UNIT/ML Solostar Pen Inject 10 Units into the skin daily at 10 pm. 5 pen 3  . Insulin Pen Needle (PX SHORTLENGTH PEN NEEDLES) 31G X 8 MM MISC 1 each by Does not apply route daily. 100 each 8  . lisinopril (PRINIVIL,ZESTRIL) 5 MG tablet Take 1 tablet (5 mg total) by mouth daily. 90 tablet 3  . tamsulosin (FLOMAX) 0.4 MG CAPS capsule Take 2 capsules (0.8 mg total) by mouth daily. 90 capsule 3  . TRUEPLUS LANCETS 28G MISC 1 each by Does not apply route 4 (four) times daily - after meals and at bedtime. 100 each 12  . atorvastatin (LIPITOR) 40 MG tablet Take 1 tablet (40 mg total) by mouth daily. (Patient not taking: Reported on 10/08/2015) 90 tablet 3  . metFORMIN (GLUCOPHAGE) 1000 MG tablet Take 1 tablet (1,000  mg total) by mouth 2 (two) times daily with a meal. (Patient not taking: Reported on 11/02/2015) 180 tablet 3   Facility-Administered Medications Prior to Visit  Medication Dose Route Frequency Provider Last Rate Last Dose  . diphenhydrAMINE (BENADRYL) capsule 25 mg  25 mg Oral Once Tresa Garter, MD        ROS Review of Systems  Constitutional: Negative for chills, fatigue, fever and unexpected weight change.  Eyes: Negative for visual disturbance.  Respiratory: Negative for cough and shortness of breath.   Cardiovascular: Negative for chest pain, palpitations and leg swelling.  Gastrointestinal: Negative for abdominal pain, blood in stool, constipation, diarrhea, nausea and vomiting.  Endocrine: Negative for polydipsia, polyphagia and polyuria.  Musculoskeletal: Negative for arthralgias, back pain, gait problem, myalgias and neck pain.  Skin: Negative for rash.  Allergic/Immunologic: Negative for immunocompromised state.  Hematological: Negative for adenopathy. Does not bruise/bleed easily.  Psychiatric/Behavioral: Negative for dysphoric mood, sleep disturbance and suicidal ideas. The patient is not nervous/anxious.     Objective:  BP 111/77 (BP Location: Left Arm, Patient Position: Sitting, Cuff Size: Large)   Pulse 82   Temp 97.7 F (36.5 C) (Oral)   Wt 240 lb 6.4 oz (109 kg)   SpO2 95%   BMI 32.60 kg/m  BP/Weight 11/02/2015 10/08/2015 5/63/1497  Systolic BP 026 378 588  Diastolic BP 77 81 64  Wt. (Lbs) 240.4 233.2 220  BMI 32.6 31.63 29.83    Physical Exam  Constitutional: He appears well-developed and well-nourished. No distress.  HENT:  Head: Normocephalic and atraumatic.  Neck: Normal range of motion. Neck supple.  Cardiovascular: Normal rate, regular rhythm, normal heart sounds and intact distal pulses.   Pulmonary/Chest: Effort normal and breath sounds normal.  Musculoskeletal: He exhibits no edema.  Neurological: He is alert.  Skin: Skin is warm and dry.  No rash noted. No erythema.  Psychiatric: He has a normal mood and affect.    Lab Results  Component Value Date   HGBA1C 8.6 10/08/2015   297   Treated with 10 U of novolog   Repeat CBG 259 Assessment & Plan:   Aadhav was seen today for medication reaction.  Diagnoses and all orders for this visit:  Uncontrolled type 2 diabetes mellitus with hyperglycemia, with long-term current use of insulin (HCC) -     POCT glucose (manual entry) -     insulin aspart (novoLOG) injection 10 Units; Inject 0.1 mLs (10 Units total) into the skin once. -     metFORMIN (GLUCOPHAGE) 1000 MG tablet; Take 1 tablet (1,000 mg total) by mouth 2 (two) times daily with a meal. -     glipiZIDE (GLUCOTROL) 5 MG tablet; Take 1 tablet (5 mg total) by mouth 2 (two) times daily before a meal. -     Insulin Glargine (LANTUS SOLOSTAR) 100 UNIT/ML Solostar Pen; Inject 15 Units into the skin daily at 10 pm. -     Glucose (CBG) -     omega-3 acid ethyl esters (LOVAZA) 1 g capsule; Take 2 capsules (2 g total) by mouth 2 (two) times daily.  Hyperlipidemia -     omega-3 acid ethyl esters (LOVAZA) 1 g capsule; Take 2 capsules (2 g total) by mouth 2 (two) times daily.  Atherosclerosis of native coronary artery of native heart without angina pectoris -     omega-3 acid ethyl esters (LOVAZA) 1 g capsule; Take 2 capsules (2 g total) by mouth 2 (two) times daily.    Meds ordered this encounter  Medications  . insulin aspart (novoLOG) injection 10 Units  . metFORMIN (GLUCOPHAGE) 1000 MG tablet    Sig: Take 1 tablet (1,000 mg total) by mouth 2 (two) times daily with a meal.    Dispense:  180 tablet    Refill:  3  . glipiZIDE (GLUCOTROL) 5 MG tablet    Sig: Take 1 tablet (5 mg total) by mouth 2 (two) times daily before a meal.    Dispense:  180 tablet    Refill:  2  . Insulin Glargine (LANTUS SOLOSTAR) 100 UNIT/ML Solostar Pen    Sig: Inject 15 Units into the skin daily at 10 pm.    Dispense:  5 pen    Refill:  3    . omega-3 acid ethyl esters (LOVAZA) 1 g capsule    Sig: Take 2 capsules (2 g total) by mouth 2 (two) times daily.    Dispense:  360 capsule    Refill:  3    Follow-up: No Follow-up on file.   [name] MD

## 2015-11-03 MED FILL — OMEGA-3 ETHYL ESTERS 1 GM C: 1 | 30 days supply | Qty: 120 | Fill #0

## 2015-11-05 MED FILL — ?TAMSULOSIN HCL 0.4 MG CAP: 0.4 | 30 days supply | Qty: 60 | Fill #1

## 2015-11-05 MED FILL — ULTICARE PEN NDL 4MM 32G: 32G X 4 MM | 25 days supply | Qty: 100 | Fill #1

## 2015-11-05 MED FILL — ?LISINOPRIL 5 MG TABLET: 5 | 30 days supply | Qty: 30 | Fill #1

## 2015-12-23 MED FILL — glipiZIDE 5 MG TABS: 5 | 30 days supply | Qty: 60 | Fill #1

## 2015-12-23 MED FILL — ?TAMSULOSIN HCL 0.4 MG CAP: 0.4 | 30 days supply | Qty: 60 | Fill #2

## 2015-12-23 MED FILL — LISINOPRIL 5 MG TABLET: 5 | 30 days supply | Qty: 30 | Fill #2

## 2015-12-23 MED FILL — ?METFORMIN HCL 1,000 MG TAB: 1000 | 30 days supply | Qty: 60 | Fill #1

## 2016-01-28 MED FILL — LISINOPRIL 5 MG TABLET: 5 | 30 days supply | Qty: 30 | Fill #3

## 2016-01-28 MED FILL — metFORMIN HCL 1000 MG TABS: 1000 | 30 days supply | Qty: 60 | Fill #2

## 2016-01-28 MED FILL — ?TAMSULOSIN HCL 0.4 MG CAP: 0.4 | 30 days supply | Qty: 60 | Fill #3

## 2016-01-28 MED FILL — glipiZIDE 5 MG TABS: 5 | 30 days supply | Qty: 60 | Fill #2

## 2016-01-29 MED FILL — OMEGA-3 ETHYL ESTERS 1 GM C: 1 | 30 days supply | Qty: 120 | Fill #1

## 2016-02-11 MED FILL — $LANTUS SOLOSTAR 100 UNITS/: 100 | 30 days supply | Qty: 6 | Fill #1

## 2016-02-26 MED FILL — metFORMIN HCL 1000 MG TABS: 1000 | 30 days supply | Qty: 60 | Fill #3

## 2016-02-26 MED FILL — LISINOPRIL 5 MG TABLET: 5 | 30 days supply | Qty: 30 | Fill #4

## 2016-02-26 MED FILL — glipiZIDE 5 MG TABS: 5 | 30 days supply | Qty: 60 | Fill #3

## 2016-03-25 MED FILL — LISINOPRIL 5 MG TABLET: 5 | 30 days supply | Qty: 30 | Fill #5

## 2016-03-25 MED FILL — ?TAMSULOSIN HCL 0.4 MG CAP: 0.4 | 30 days supply | Qty: 60 | Fill #4

## 2016-03-25 MED FILL — glipiZIDE 5 MG TABS: 5 | 30 days supply | Qty: 60 | Fill #4

## 2016-03-25 MED FILL — OMEGA-3 ETHYL ESTERS 1 GM C: 1 | 30 days supply | Qty: 120 | Fill #2

## 2016-03-25 MED FILL — $LANTUS SOLOSTAR 100 UNITS/: 100 | 30 days supply | Qty: 6 | Fill #2

## 2016-03-25 MED FILL — ?METFORMIN HCL 1,000 MG TAB: 1000 | 30 days supply | Qty: 60 | Fill #4

## 2016-04-29 MED FILL — ?TAMSULOSIN HCL 0.4 MG CAP: 0.4 | 30 days supply | Qty: 60 | Fill #5

## 2016-04-29 MED FILL — OMEGA-3 ETHYL ESTERS 1 GM C: 1 | 30 days supply | Qty: 120 | Fill #3

## 2016-04-29 MED FILL — LISINOPRIL 5 MG TABLET: 5 | 30 days supply | Qty: 30 | Fill #6

## 2016-04-29 MED FILL — $LANTUS SOLOSTAR 100 UNITS/: 100 | 30 days supply | Qty: 6 | Fill #3

## 2016-04-29 MED FILL — glipiZIDE 5 MG TABS: 5 | 30 days supply | Qty: 60 | Fill #5

## 2016-04-29 MED FILL — ?METFORMIN HCL 1,000 MG TAB: 1000 | 30 days supply | Qty: 60 | Fill #5

## 2016-05-26 MED FILL — TRUEPLUS PEN NDL 32GX5/32": 32G X 4 MM | 25 days supply | Qty: 100 | Fill #2

## 2016-05-26 MED FILL — TRUEPLUS PEN NDL 32GX5/32: 32G X 4 MM | 25 days supply | Qty: 100 | Fill #2

## 2016-06-01 ENCOUNTER — Other Ambulatory Visit: Payer: Self-pay | Admitting: Family Medicine

## 2016-06-01 DIAGNOSIS — N400 Enlarged prostate without lower urinary tract symptoms: Secondary | ICD-10-CM

## 2016-06-01 MED FILL — LISINOPRIL 5 MG TAB: 5 | 30 days supply | Qty: 30 | Fill #7

## 2016-06-01 MED FILL — OMEGA-3 ETHYL ESTERS 1 GM C: 1 | 30 days supply | Qty: 120 | Fill #4

## 2016-06-01 MED FILL — glipiZIDE 5 MG TABS: 5 | 30 days supply | Qty: 60 | Fill #6

## 2016-06-01 MED FILL — ?METFORMIN HCL 1,000 MG TAB: 1000 | 30 days supply | Qty: 60 | Fill #6

## 2016-06-02 MED FILL — $LANTUS SOLOSTAR 100 UNITS/: 100 | 20 days supply | Qty: 3 | Fill #4

## 2016-06-02 MED FILL — TAMSULOSIN HCL 0.4 MG CAP: 0.4 | 15 days supply | Qty: 30 | Fill #0

## 2016-06-27 ENCOUNTER — Other Ambulatory Visit: Payer: Self-pay | Admitting: Family Medicine

## 2016-06-27 DIAGNOSIS — N400 Enlarged prostate without lower urinary tract symptoms: Secondary | ICD-10-CM

## 2016-06-27 MED FILL — ?METFORMIN HCL 1,000 MG TAB: 1000 | 30 days supply | Qty: 60 | Fill #7

## 2016-06-27 MED FILL — LISINOPRIL 5 MG TAB: 5 | 30 days supply | Qty: 30 | Fill #8

## 2016-06-27 MED FILL — $LANTUS SOLOSTAR 100 UNITS/: 100 | 20 days supply | Qty: 3 | Fill #5

## 2016-06-27 MED FILL — TAMSULOSIN HCL 0.4 MG CAP: 0.4 | 15 days supply | Qty: 30 | Fill #0

## 2016-06-27 MED FILL — glipiZIDE 5 MG TABS: 5 | 30 days supply | Qty: 60 | Fill #7

## 2016-07-28 ENCOUNTER — Other Ambulatory Visit: Payer: Self-pay | Admitting: Family Medicine

## 2016-07-28 DIAGNOSIS — N400 Enlarged prostate without lower urinary tract symptoms: Secondary | ICD-10-CM

## 2016-07-28 DIAGNOSIS — Z794 Long term (current) use of insulin: Secondary | ICD-10-CM

## 2016-07-28 DIAGNOSIS — E1165 Type 2 diabetes mellitus with hyperglycemia: Secondary | ICD-10-CM

## 2016-07-28 MED FILL — ?METFORMIN HCL 1,000 MG TAB: 1000 | 30 days supply | Qty: 60 | Fill #8

## 2016-07-28 MED FILL — $LANTUS SOLOSTAR 100 UNITS/: 100 | 20 days supply | Qty: 3 | Fill #6

## 2016-07-28 MED FILL — ?GLIPIZIDE 5MG TABLET: 5 | 30 days supply | Qty: 60 | Fill #8

## 2016-07-29 ENCOUNTER — Ambulatory Visit: Payer: Self-pay | Attending: Internal Medicine

## 2016-07-29 MED FILL — ?LISINOPRIL 5 MG TABLET: 5 | 30 days supply | Qty: 30 | Fill #9

## 2016-08-01 ENCOUNTER — Other Ambulatory Visit: Payer: Self-pay

## 2016-08-01 DIAGNOSIS — Z794 Long term (current) use of insulin: Principal | ICD-10-CM

## 2016-08-01 DIAGNOSIS — E1165 Type 2 diabetes mellitus with hyperglycemia: Secondary | ICD-10-CM

## 2016-08-01 MED ORDER — INSULIN GLARGINE 100 UNIT/ML SOLOSTAR PEN: 15.0000 [IU] | PEN_INJECTOR | Freq: Every day | SUBCUTANEOUS | 3 refills | Status: DC

## 2016-08-19 MED FILL — $LANTUS SOLOSTAR 100 UNITS/: 100 | 20 days supply | Qty: 3 | Fill #7

## 2016-09-01 MED FILL — glipiZIDE 5 MG TABS: 5 | 30 days supply | Qty: 60 | Fill #0

## 2016-09-01 MED FILL — ?LISINOPRIL 5 MG TABLET: 5 | 30 days supply | Qty: 30 | Fill #10

## 2016-09-01 MED FILL — TRUEPLUS PEN NDL 32GX5/32": 32G X 4 MM | 25 days supply | Qty: 100 | Fill #0

## 2016-09-01 MED FILL — TRUEPLUS PEN NDL 32GX5/32: 32G X 4 MM | 25 days supply | Qty: 100 | Fill #0

## 2016-09-01 MED FILL — $LANTUS SOLOSTAR 100 UNITS/: 100 | 20 days supply | Qty: 3 | Fill #8

## 2016-09-01 MED FILL — ?TAMSULOSIN HCL 0.4 MG CAP: 0.4 | 15 days supply | Qty: 30 | Fill #0

## 2016-09-01 MED FILL — ?METFORMIN HCL 1,000 MG TAB: 1000 | 30 days supply | Qty: 60 | Fill #9

## 2016-09-21 ENCOUNTER — Other Ambulatory Visit: Payer: Self-pay | Admitting: Family Medicine

## 2016-09-21 DIAGNOSIS — N400 Enlarged prostate without lower urinary tract symptoms: Secondary | ICD-10-CM

## 2016-09-21 DIAGNOSIS — I10 Essential (primary) hypertension: Secondary | ICD-10-CM

## 2016-09-21 MED FILL — LISINOPRIL 5 MG TAB: 5 | 30 days supply | Qty: 30 | Fill #11

## 2016-09-21 MED FILL — metFORMIN HCL 1000 MG TABS: 1000 | 30 days supply | Qty: 60 | Fill #10

## 2016-09-21 MED FILL — $LANTUS SOLOSTAR 100 UNITS/: 100 | 40 days supply | Qty: 6 | Fill #9

## 2016-09-21 MED FILL — glipiZIDE 5 MG TABS: 5 | 30 days supply | Qty: 60 | Fill #1

## 2016-09-23 ENCOUNTER — Other Ambulatory Visit: Payer: Self-pay | Admitting: Family Medicine

## 2016-09-23 DIAGNOSIS — N400 Enlarged prostate without lower urinary tract symptoms: Secondary | ICD-10-CM

## 2016-09-23 MED FILL — OMEGA-3 ETHYL ESTERS 1 GM C: 1 | 30 days supply | Qty: 120 | Fill #5

## 2016-11-08 ENCOUNTER — Other Ambulatory Visit: Payer: Self-pay | Admitting: Family Medicine

## 2016-11-08 DIAGNOSIS — I10 Essential (primary) hypertension: Secondary | ICD-10-CM

## 2016-11-08 DIAGNOSIS — I2510 Atherosclerotic heart disease of native coronary artery without angina pectoris: Secondary | ICD-10-CM

## 2016-11-08 DIAGNOSIS — E785 Hyperlipidemia, unspecified: Secondary | ICD-10-CM

## 2016-11-08 DIAGNOSIS — Z794 Long term (current) use of insulin: Secondary | ICD-10-CM

## 2016-11-08 DIAGNOSIS — E1165 Type 2 diabetes mellitus with hyperglycemia: Secondary | ICD-10-CM

## 2016-11-08 MED FILL — !LANTUS SOLOSTAR 100UNITS/M: 100 | 20 days supply | Qty: 3 | Fill #0

## 2016-11-08 MED FILL — glipiZIDE 5 MG TABS: 5 | 30 days supply | Qty: 60 | Fill #2

## 2016-11-15 ENCOUNTER — Other Ambulatory Visit: Payer: Self-pay | Admitting: Pharmacist

## 2016-11-15 DIAGNOSIS — E1165 Type 2 diabetes mellitus with hyperglycemia: Secondary | ICD-10-CM

## 2016-11-15 DIAGNOSIS — I2510 Atherosclerotic heart disease of native coronary artery without angina pectoris: Secondary | ICD-10-CM

## 2016-11-15 DIAGNOSIS — I10 Essential (primary) hypertension: Secondary | ICD-10-CM

## 2016-11-15 DIAGNOSIS — E785 Hyperlipidemia, unspecified: Secondary | ICD-10-CM

## 2016-11-15 DIAGNOSIS — Z794 Long term (current) use of insulin: Principal | ICD-10-CM

## 2016-11-15 MED ORDER — LISINOPRIL 5 MG PO TABS: 5.0000 mg | ORAL_TABLET | Freq: Every day | ORAL | 0 refills | Status: DC

## 2016-11-15 MED ORDER — OMEGA-3-ACID ETHYL ESTERS 1 G PO CAPS: 2.0000 g | ORAL_CAPSULE | Freq: Two times a day (BID) | ORAL | 0 refills | Status: DC

## 2016-11-15 MED ORDER — METFORMIN HCL 1000 MG PO TABS: 1000.0000 mg | ORAL_TABLET | Freq: Two times a day (BID) | ORAL | 0 refills | Status: DC

## 2016-11-23 MED FILL — LISINOPRIL 5 MG TAB: 5 | 30 days supply | Qty: 30 | Fill #0

## 2016-11-23 MED FILL — !LANTUS SOLOSTAR 100UNITS/M: 100 | 20 days supply | Qty: 3 | Fill #1

## 2016-11-23 MED FILL — ?METFORMIN HCL 1,000 MG TAB: 1000 | 30 days supply | Qty: 60 | Fill #0

## 2016-11-23 MED FILL — OMEGA-3 ETHYL ESTERS 1 GM C: 1 | 30 days supply | Qty: 120 | Fill #0

## 2016-12-01 ENCOUNTER — Encounter: Payer: Self-pay | Admitting: Family Medicine

## 2016-12-01 ENCOUNTER — Ambulatory Visit: Payer: Self-pay | Attending: Family Medicine | Admitting: Family Medicine

## 2016-12-01 ENCOUNTER — Other Ambulatory Visit: Payer: Self-pay | Admitting: Family Medicine

## 2016-12-01 VITALS — BP 109/77 | HR 96 | Temp 98.1°F | Resp 18 | Ht 73.0 in | Wt 258.8 lb

## 2016-12-01 DIAGNOSIS — B353 Tinea pedis: Secondary | ICD-10-CM | POA: Insufficient documentation

## 2016-12-01 DIAGNOSIS — Z79899 Other long term (current) drug therapy: Secondary | ICD-10-CM | POA: Insufficient documentation

## 2016-12-01 DIAGNOSIS — E1169 Type 2 diabetes mellitus with other specified complication: Secondary | ICD-10-CM

## 2016-12-01 DIAGNOSIS — Z0000 Encounter for general adult medical examination without abnormal findings: Secondary | ICD-10-CM

## 2016-12-01 DIAGNOSIS — Z23 Encounter for immunization: Secondary | ICD-10-CM | POA: Insufficient documentation

## 2016-12-01 DIAGNOSIS — E119 Type 2 diabetes mellitus without complications: Secondary | ICD-10-CM | POA: Insufficient documentation

## 2016-12-01 DIAGNOSIS — Z794 Long term (current) use of insulin: Secondary | ICD-10-CM | POA: Insufficient documentation

## 2016-12-01 DIAGNOSIS — I10 Essential (primary) hypertension: Secondary | ICD-10-CM | POA: Insufficient documentation

## 2016-12-01 DIAGNOSIS — Z7982 Long term (current) use of aspirin: Secondary | ICD-10-CM | POA: Insufficient documentation

## 2016-12-01 DIAGNOSIS — L602 Onychogryphosis: Secondary | ICD-10-CM

## 2016-12-01 DIAGNOSIS — E785 Hyperlipidemia, unspecified: Secondary | ICD-10-CM | POA: Insufficient documentation

## 2016-12-01 LAB — GLUCOSE, POCT (MANUAL RESULT ENTRY): POC GLUCOSE: 123 mg/dL — AB (ref 70–99)

## 2016-12-01 LAB — POCT GLYCOSYLATED HEMOGLOBIN (HGB A1C): Hemoglobin A1C: 8

## 2016-12-01 MED ORDER — LISINOPRIL 5 MG PO TABS: 5.0000 mg | ORAL_TABLET | Freq: Every day | ORAL | 3 refills | Status: DC

## 2016-12-01 MED ORDER — INSULIN PEN NEEDLE 32G X 4 MM MISC: 12 refills | Status: DC

## 2016-12-01 MED ORDER — INSULIN GLARGINE 100 UNIT/ML SOLOSTAR PEN: 18.0000 [IU] | PEN_INJECTOR | Freq: Every day | SUBCUTANEOUS | 3 refills | Status: DC

## 2016-12-01 MED ORDER — GLUCOSE BLOOD VI STRP: ORAL_STRIP | 12 refills | Status: DC

## 2016-12-01 MED ORDER — TRUEPLUS LANCETS 28G MISC: 1.0000 | Freq: Three times a day (TID) | 12 refills | Status: DC

## 2016-12-01 MED FILL — TRUEPLUS PEN NDL 32GX5/32": 32GX 5/32" | 30 days supply | Qty: 100 | Fill #0

## 2016-12-01 MED FILL — TRUEPLUS PEN NDL 32GX5/32: 32GX 5/32" | 30 days supply | Qty: 100 | Fill #0

## 2016-12-01 MED FILL — TRUEplus LANCETS 28G MISC: 25 days supply | Qty: 100 | Fill #0

## 2016-12-01 MED FILL — $LANTUS SOLOSTAR 100 UNITS/: 100 | 30 days supply | Qty: 6 | Fill #0

## 2016-12-01 MED FILL — TRUE METRIX TEST STRIP: 30 days supply | Qty: 100 | Fill #0

## 2016-12-01 MED FILL — LISINOPRIL 5 MG TAB: 5 | 30 days supply | Qty: 30 | Fill #0

## 2016-12-01 NOTE — Patient Instructions (Signed)
Apply for orange card.   Blood Glucose Monitoring, Adult Monitoring your blood sugar (glucose) helps you manage your diabetes. It also helps you and your health care provider determine how well your diabetes management plan is working. Blood glucose monitoring involves checking your blood glucose as often as directed, and keeping a record (log) of your results over time. Why should I monitor my blood glucose? Checking your blood glucose regularly can:  Help you understand how food, exercise, illnesses, and medicines affect your blood glucose.  Let you know what your blood glucose is at any time. You can quickly tell if you are having low blood glucose (hypoglycemia) or high blood glucose (hyperglycemia).  Help you and your health care provider adjust your medicines as needed.  When should I check my blood glucose? Follow instructions from your health care provider about how often to check your blood glucose. This may depend on:  The type of diabetes you have.  How well-controlled your diabetes is.  Medicines you are taking.  If you have type 1 diabetes:  Check your blood glucose at least 2 times a day.  Also check your blood glucose: ? Before every insulin injection. ? Before and after exercise. ? Between meals. ? 2 hours after a meal. ? Occasionally between 2:00 a.m. and 3:00 a.m., as directed. ? Before potentially dangerous tasks, like driving or using heavy machinery. ? At bedtime.  You may need to check your blood glucose more often, up to 6-10 times a day: ? If you use an insulin pump. ? If you need multiple daily injections (MDI). ? If your diabetes is not well-controlled. ? If you are ill. ? If you have a history of severe hypoglycemia. ? If you have a history of not knowing when your blood glucose is getting low (hypoglycemia unawareness). If you have type 2 diabetes:  If you take insulin or other diabetes medicines, check your blood glucose at least 2 times a  day.  If you are on intensive insulin therapy, check your blood glucose at least 4 times a day. Occasionally, you may also need to check between 2:00 a.m. and 3:00 a.m., as directed.  Also check your blood glucose: ? Before and after exercise. ? Before potentially dangerous tasks, like driving or using heavy machinery.  You may need to check your blood glucose more often if: ? Your medicine is being adjusted. ? Your diabetes is not well-controlled. ? You are ill. What is a blood glucose log?  A blood glucose log is a record of your blood glucose readings. It helps you and your health care provider: ? Look for patterns in your blood glucose over time. ? Adjust your diabetes management plan as needed.  Every time you check your blood glucose, write down your result and notes about things that may be affecting your blood glucose, such as your diet and exercise for the day.  Most glucose meters store a record of glucose readings in the meter. Some meters allow you to download your records to a computer. How do I check my blood glucose? Follow these steps to get accurate readings of your blood glucose: Supplies needed   Blood glucose meter.  Test strips for your meter. Each meter has its own strips. You must use the strips that come with your meter.  A needle to prick your finger (lancet). Do not use lancets more than once.  A device that holds the lancet (lancing device).  A journal or log book to  write down your results. Procedure  Wash your hands with soap and water.  Prick the side of your finger (not the tip) with the lancet. Use a different finger each time.  Gently rub the finger until a small drop of blood appears.  Follow instructions that come with your meter for inserting the test strip, applying blood to the strip, and using your blood glucose meter.  Write down your result and any notes. Alternative testing sites  Some meters allow you to use areas of your body  other than your finger (alternative sites) to test your blood.  If you think you may have hypoglycemia, or if you have hypoglycemia unawareness, do not use alternative sites. Use your finger instead.  Alternative sites may not be as accurate as the fingers, because blood flow is slower in these areas. This means that the result you get may be delayed, and it may be different from the result that you would get from your finger.  The most common alternative sites are: ? Forearm. ? Thigh. ? Palm of the hand. Additional tips  Always keep your supplies with you.  If you have questions or need help, all blood glucose meters have a 24-hour "hotline" number that you can call. You may also contact your health care provider.  After you use a few boxes of test strips, adjust (calibrate) your blood glucose meter by following instructions that came with your meter. This information is not intended to replace advice given to you by your health care provider. Make sure you discuss any questions you have with your health care provider. Document Released: 02/10/2003 Document Revised: 08/28/2015 Document Reviewed: 07/20/2015 Elsevier Interactive Patient Education  2017 [employer].

## 2016-12-01 NOTE — Progress Notes (Signed)
Patient is here for f/up  Med refill

## 2016-12-01 NOTE — Progress Notes (Signed)
Subjective:  Patient ID: Shane Bond, male    DOB: 07-04-1960  Age: 56 y.o. MRN: [id number]  CC: Diabetes   HPI [name] presents to establish care and for follow up. PMH includes DM, HLD,CAD, and HTN. History of DM. Symptoms: none Patient denies foot ulcerations, feet paresthesias, nausea, polydipsia, polyuria, visual disturbances and vomiting.  Evaluation to date has been included: fasting blood sugar, fasting lipid panel and hemoglobin A1C.  Home sugars: he does not  Treatment to date: metformin, glipizide, lantus. History of HTN/HLD.  He is not exercising and is not adherent to low salt diet.  He does not check BP at home. Cardiac symptoms none. Patient denies chest pain, chest pressure/discomfort, claudication, dyspnea, lower extremity edema, near-syncope, palpitations and syncope.  Cardiovascular risk factors: dyslipidemia, hypertension, male gender and sedentary lifestyle. Use of agents associated with hypertension: none. History of target organ damage: none  Outpatient Medications Prior to Visit  Medication Sig Dispense Refill  . acetaminophen (TYLENOL) 500 MG tablet Take 1,000 mg by mouth every 6 (six) hours as needed for mild pain, moderate pain or headache. Reported on 06/18/2015    . aspirin EC 81 MG tablet Take 81 mg by mouth daily.    . Blood Glucose Monitoring Suppl (TRUE METRIX METER) w/Device KIT 1 each by Does not apply route 3 (three) times daily. 1 kit 0  . glipiZIDE (GLUCOTROL) 5 MG tablet TAKE 1 TABLET (5 MG TOTAL) BY MOUTH 2 (TWO) TIMES DAILY BEFORE A MEAL. 180 tablet 2  . metFORMIN (GLUCOPHAGE) 1000 MG tablet Take 1 tablet (1,000 mg total) by mouth 2 (two) times daily with a meal. 60 tablet 0  . omega-3 acid ethyl esters (LOVAZA) 1 g capsule Take 2 capsules (2 g total) by mouth 2 (two) times daily. 120 capsule 0  . tamsulosin (FLOMAX) 0.4 MG CAPS capsule TAKE 2 CAPSULES BY MOUTH DAILY. 30 capsule 0  . glucose blood (TRUE METRIX BLOOD GLUCOSE TEST) test strip Use  as instructed 100 each 12  . Insulin Glargine (LANTUS SOLOSTAR) 100 UNIT/ML Solostar Pen Inject 15 Units into the skin daily at 10 pm. 5 pen 3  . lisinopril (PRINIVIL,ZESTRIL) 5 MG tablet Take 1 tablet (5 mg total) by mouth daily. 30 tablet 0  . TRUEPLUS LANCETS 28G MISC 1 each by Does not apply route 4 (four) times daily - after meals and at bedtime. 100 each 12  . TRUEPLUS PEN NEEDLES 32G X 4 MM MISC USE AS DIRECTED 100 each 8   Facility-Administered Medications Prior to Visit  Medication Dose Route Frequency Provider Last Rate Last Dose  . diphenhydrAMINE (BENADRYL) capsule 25 mg  25 mg Oral Once Tresa Garter, MD        ROS Review of Systems  Constitutional: Negative.   Eyes: Negative.   Respiratory: Negative.   Cardiovascular: Negative.   Gastrointestinal: Negative.   Skin: Negative.   Neurological: Negative.    Objective:  BP 109/77 (BP Location: Left Arm, Patient Position: Sitting, Cuff Size: Normal)   Pulse 96   Temp 98.1 F (36.7 C) (Oral)   Resp 18   Ht '6\' 1"'  (1.854 m)   Wt 258 lb 12.8 oz (117.4 kg)   SpO2 94%   BMI 34.14 kg/m   BP/Weight 12/01/2016 11/02/2015 1/75/1025  Systolic BP 852 778 242  Diastolic BP 77 77 81  Wt. (Lbs) 258.8 240.4 233.2  BMI 34.14 32.6 31.63     Physical Exam  Constitutional: He appears well-developed  and well-nourished.  Eyes: Pupils are equal, round, and reactive to light. Conjunctivae are normal.  Cardiovascular: Normal rate, regular rhythm, normal heart sounds and intact distal pulses.   Pulmonary/Chest: Effort normal and breath sounds normal.  Abdominal: Soft. Bowel sounds are normal.  Neurological: No sensory deficit.  Skin: Skin is warm and dry.  Nursing note and vitals reviewed.  Assessment & Plan:   1. Diabetes mellitus without complication (HCC) Increased lantus to 18 units. Check CBG TID. Follow up with clinical pharmacist in 2 weeks. Follow up with PCP in 3 months - Glucose (CBG) - HgB A1c - Insulin  Glargine (LANTUS SOLOSTAR) 100 UNIT/ML Solostar Pen; Inject 18 Units into the skin daily at 10 pm.  Dispense: 5 pen; Refill: 3 - Ambulatory referral to Ophthalmology - Insulin Pen Needle (TRUEPLUS PEN NEEDLES) 32G X 4 MM MISC; USE AS DIRECTED  Dispense: 100 each; Refill: 12 - glucose blood (TRUE METRIX BLOOD GLUCOSE TEST) test strip; Use as instructed  Dispense: 100 each; Refill: 12 - TRUEPLUS LANCETS 28G MISC; 1 each by Does not apply route 4 (four) times daily - after meals and at bedtime.  Dispense: 100 each; Refill: 12 - COMPLETE METABOLIC PANEL WITH GFR  2. Needs flu shot  - Flu Vaccine QUAD 6+ mos PF IM (Fluarix Quad PF)  3. Tinea pedis of both feet  - Ambulatory referral to Podiatry  4. Healthcare maintenance  - Tdap vaccine greater than or equal to 7yo IM  5. Dyslipidemia associated with type 2 diabetes mellitus (HCC)  - Lipid Panel - COMPLETE METABOLIC PANEL WITH GFR  6. Overgrown toenails  - Ambulatory referral to Podiatry  7. Essential hypertension  - lisinopril (PRINIVIL,ZESTRIL) 5 MG tablet; Take 1 tablet (5 mg total) by mouth daily.  Dispense: 30 tablet; Refill: 3   Meds ordered this encounter  Medications  . Insulin Glargine (LANTUS SOLOSTAR) 100 UNIT/ML Solostar Pen    Sig: Inject 18 Units into the skin daily at 10 pm.    Dispense:  5 pen    Refill:  3    Order Specific Question:   Supervising Provider    Answer:   Tresa Garter [id number]  . Insulin Pen Needle (TRUEPLUS PEN NEEDLES) 32G X 4 MM MISC    Sig: USE AS DIRECTED    Dispense:  100 each    Refill:  12    Order Specific Question:   Supervising Provider    Answer:   Tresa Garter [id number]  . glucose blood (TRUE METRIX BLOOD GLUCOSE TEST) test strip    Sig: Use as instructed    Dispense:  100 each    Refill:  12    Order Specific Question:   Supervising Provider    Answer:   Tresa Garter [[id number]]  . TRUEPLUS LANCETS 28G MISC    Sig: 1 each by Does not apply route 4  (four) times daily - after meals and at bedtime.    Dispense:  100 each    Refill:  12    Order Specific Question:   Supervising Provider    Answer:   Tresa Garter [id number]  . lisinopril (PRINIVIL,ZESTRIL) 5 MG tablet    Sig: Take 1 tablet (5 mg total) by mouth daily.    Dispense:  30 tablet    Refill:  3    Must have office visit for refills    Order Specific Question:   Supervising Provider    Answer:   Tresa Garter [  [id number]]    Follow-up: Return in about 2 weeks (around 12/15/2016) for DM with Stacy.   Alfonse Spruce FNP

## 2016-12-02 LAB — CMP14+EGFR
A/G RATIO: 1.3 (ref 1.2–2.2)
ALK PHOS: 89 IU/L (ref 39–117)
ALT: 101 IU/L — ABNORMAL HIGH (ref 0–44)
AST: 79 IU/L — AB (ref 0–40)
Albumin: 4.3 g/dL (ref 3.5–5.5)
BUN/Creatinine Ratio: 16 (ref 9–20)
BUN: 16 mg/dL (ref 6–24)
Bilirubin Total: 0.3 mg/dL (ref 0.0–1.2)
CO2: 20 mmol/L (ref 20–29)
CREATININE: 1.01 mg/dL (ref 0.76–1.27)
Calcium: 9.5 mg/dL (ref 8.7–10.2)
Chloride: 100 mmol/L (ref 96–106)
GFR calc Af Amer: 96 mL/min/{1.73_m2} (ref >59)
GFR calc non Af Amer: 83 mL/min/{1.73_m2} (ref >59)
GLOBULIN, TOTAL: 3.2 g/dL (ref 1.5–4.5)
Glucose: 109 mg/dL — ABNORMAL HIGH (ref 65–99)
POTASSIUM: 4.2 mmol/L (ref 3.5–5.2)
SODIUM: 136 mmol/L (ref 134–144)
Total Protein: 7.5 g/dL (ref 6.0–8.5)

## 2016-12-02 LAB — LIPID PANEL
Chol/HDL Ratio: 6.2 ratio — ABNORMAL HIGH (ref 0.0–5.0)
Cholesterol, Total: 185 mg/dL (ref 100–199)
HDL: 30 mg/dL — AB (ref >39)
LDL CALC: 104 mg/dL — AB (ref 0–99)
Triglycerides: 257 mg/dL — ABNORMAL HIGH (ref 0–149)
VLDL CHOLESTEROL CAL: 51 mg/dL — AB (ref 5–40)

## 2016-12-13 ENCOUNTER — Other Ambulatory Visit: Payer: Self-pay | Admitting: *Deleted

## 2016-12-13 DIAGNOSIS — E119 Type 2 diabetes mellitus without complications: Secondary | ICD-10-CM

## 2016-12-13 MED ORDER — INSULIN GLARGINE 100 UNIT/ML SOLOSTAR PEN: 18.0000 [IU] | PEN_INJECTOR | Freq: Every day | SUBCUTANEOUS | 3 refills | Status: DC

## 2016-12-13 NOTE — Telephone Encounter (Signed)
PRINTED FOR PASS PROGRAM 

## 2016-12-15 ENCOUNTER — Ambulatory Visit: Payer: Self-pay | Admitting: Pharmacist

## 2016-12-20 ENCOUNTER — Other Ambulatory Visit: Payer: Self-pay | Admitting: Family Medicine

## 2016-12-20 MED ORDER — FENOFIBRATE 160 MG PO TABS: 160.0000 mg | ORAL_TABLET | Freq: Every day | ORAL | 3 refills | Status: DC

## 2016-12-21 ENCOUNTER — Other Ambulatory Visit: Payer: Self-pay | Admitting: Internal Medicine

## 2016-12-21 DIAGNOSIS — Z794 Long term (current) use of insulin: Principal | ICD-10-CM

## 2016-12-21 DIAGNOSIS — E1165 Type 2 diabetes mellitus with hyperglycemia: Secondary | ICD-10-CM

## 2016-12-22 ENCOUNTER — Ambulatory Visit: Payer: Self-pay | Attending: Family Medicine | Admitting: Pharmacist

## 2016-12-22 ENCOUNTER — Telehealth: Payer: Self-pay

## 2016-12-22 ENCOUNTER — Encounter: Payer: Self-pay | Admitting: Pharmacist

## 2016-12-22 DIAGNOSIS — Z794 Long term (current) use of insulin: Secondary | ICD-10-CM | POA: Insufficient documentation

## 2016-12-22 DIAGNOSIS — E785 Hyperlipidemia, unspecified: Secondary | ICD-10-CM | POA: Insufficient documentation

## 2016-12-22 DIAGNOSIS — E119 Type 2 diabetes mellitus without complications: Secondary | ICD-10-CM | POA: Insufficient documentation

## 2016-12-22 LAB — GLUCOSE, POCT (MANUAL RESULT ENTRY): POC Glucose: 211 mg/dl — AB (ref 70–99)

## 2016-12-22 MED FILL — ?METFORMIN HCL 1,000 MG TAB: 1000 | 30 days supply | Qty: 60 | Fill #0

## 2016-12-22 NOTE — Progress Notes (Signed)
   S:     Chief Complaint  Patient presents with  . Medication Management    Patient arrives in good spirits.  Presents for diabetes evaluation, education, and management at the request of Fredia Beets. Patient was referred on 12/01/16.  Patient was last seen by Primary Care Provider on 12/01/16.   Patient reports adherence with medications.  Current diabetes medications include: Lantus 18 units daily, glipizide 5 mg BID, metformin 1000 mg BID  Patient denies hypoglycemic events. He does not check his blood sugars at home. He reports that he does remember to.   Patient reports nocturia.  Patient denies neuropathy. Patient denies visual changes. Patient reports self foot exams.   Patient reports UTI. He has to self-cath in order to produce a urine sample.  Patient would like to know what changes he can make to his diet to help with his cholesterol.    O:  Physical Exam   ROS   Lab Results  Component Value Date   HGBA1C 8.0 12/01/2016   There were no vitals filed for this visit.  POCT glucose = 211 (post-prandial)  A/P: Diabetes longstanding currently uncontrolled based on A1c of 8.0. Patient denies hypoglycemic events and is able to verbalize appropriate hypoglycemia management plan. Patient reports adherence with medication. Control is suboptimal due to lack of monitoring blood sugar.  Continue current medications - unable to make changes without any readings. Patient given log book and will monitor blood sugar and return in 1 month to see me for review  Concern for UTI, patient unable to give sample as we do not have an male catheters here. Discussed with PCP, and she recommended that patient go to urgent care for further evaluation.   Hyperlipidemia: reviewed cholesterol with patient and dietary changes he can make as well as reviewed fenofibrate with him. Will need to keep close watch on liver function while on fenofibrate.  Next A1C anticipated January 2019.     Written patient instructions provided.  Total time in face to face counseling 20 minutes.   Follow up in Pharmacist Clinic Visit in 1 month.   Patient seen with Thornton Park, PharmD Candidate

## 2016-12-22 NOTE — Telephone Encounter (Signed)
-----   Message from Alfonse Spruce, [place] sent at 12/20/2016  7:01 PM EDT ----- Kidney function normal Liver function tests are slightly elevated. .  Cholesterol levels are abnormal. This can increase your risk of heart disease overtime. You will be prescribed fenofibrate. Start eating a diet low in saturated fat. Limit your intake of fried foods, red meats, and whole milk.  Recommend follow up in 3 months.

## 2016-12-22 NOTE — Patient Instructions (Addendum)
Thanks for coming to see [place]  Continue current medications  Start checking blood sugar.  Take fenofibrate for cholesterol   Come back and see me in 1 month  Go to Urgent Care for UTI.   Cholesterol Cholesterol is a fat. Your body needs a small amount of cholesterol. Cholesterol (plaque) may build up in your blood vessels (arteries). That makes you more likely to have a heart attack or stroke. You cannot feel your cholesterol level. Having a blood test is the only way to find out if your level is high. Keep your test results. Work with your doctor to keep your cholesterol at a good level. What do the results mean?  Total cholesterol is how much cholesterol is in your blood.  LDL is bad cholesterol. This is the type that can build up. Try to have low LDL.  HDL is good cholesterol. It cleans your blood vessels and carries LDL away. Try to have high HDL.  Triglycerides are fat that the body can store or burn for energy. What are good levels of cholesterol?  Total cholesterol below 200.  LDL below 100 is good for people who have health risks. LDL below 70 is good for people who have very high risks.  HDL above 40 is good. It is best to have HDL of 60 or higher.  Triglycerides below 150. How can I lower my cholesterol? Diet Follow your diet program as told by your doctor.  Choose fish, white meat chicken, or [place] that is roasted or baked. Try not to eat red meat, fried foods, sausage, or lunch meats.  Eat lots of fresh fruits and vegetables.  Choose whole grains, beans, pasta, potatoes, and cereals.  Choose olive oil, corn oil, or canola oil. Only use small amounts.  Try not to eat butter, mayonnaise, shortening, or palm kernel oils.  Try not to eat foods with trans fats.  Choose low-fat or nonfat dairy foods. ? Drink skim or nonfat milk. ? Eat low-fat or nonfat yogurt and cheeses. ? Try not to drink whole milk or cream. ? Try not to eat ice cream, egg yolks, or  full-fat cheeses.  Healthy desserts include angel food cake, ginger snaps, animal crackers, hard candy, popsicles, and low-fat or nonfat frozen yogurt. Try not to eat pastries, cakes, pies, and cookies.  Exercise Follow your exercise program as told by your doctor.  Be more active. Try gardening, walking, and taking the stairs.  Ask your doctor about ways that you can be more active.  Medicine  Take over-the-counter and prescription medicines only as told by your doctor.  This information is not intended to replace advice given to you by your health care provider. Make sure you discuss any questions you have with your health care provider. Document Released: 05/06/2008 Document Revised: 09/09/2015 Document Reviewed: 08/20/2015 Elsevier Interactive Patient Education  2017 [employer].

## 2016-12-22 NOTE — Telephone Encounter (Signed)
CMA call regarding lab results   Patient Verify DOB   Patient was aware and understood  

## 2016-12-23 MED FILL — FENOFIBRATE 160 MG TABLET: 160 | 30 days supply | Qty: 30 | Fill #0

## 2016-12-23 MED FILL — glipiZIDE 5 MG TABS: 5 | 30 days supply | Qty: 60 | Fill #3

## 2017-01-18 MED FILL — $LANTUS SOLOSTAR 100 UNITS/: 100 | 30 days supply | Qty: 6 | Fill #1

## 2017-01-18 MED FILL — ?LISINOPRIL 5 MG TABLET: 5 | 30 days supply | Qty: 30 | Fill #1

## 2017-01-18 MED FILL — FENOFIBRATE 160 MG TABLET: 160 | 30 days supply | Qty: 30 | Fill #1

## 2017-01-18 MED FILL — ?GLIPIZIDE 5MG TABLET: 5 | 30 days supply | Qty: 60 | Fill #4

## 2017-01-18 MED FILL — ?METFORMIN HCL 1,000 MG TAB: 1000 | 30 days supply | Qty: 60 | Fill #1

## 2017-01-26 ENCOUNTER — Ambulatory Visit: Payer: Self-pay | Admitting: Pharmacist

## 2017-01-26 NOTE — Progress Notes (Deleted)
   S:     No chief complaint on file.   Patient arrives in good spirits.  Presents for diabetes evaluation, education, and management at the request of Fredia Beets. Patient was referred on 12/01/16.  Patient was last seen by Primary Care Provider on 12/01/16.   Patient reports adherence with medications.  Current diabetes medications include: Lantus 18 units daily, glipizide 5 mg BID, metformin 1000 mg BID  Patient denies hypoglycemic events. He does not check his blood sugars at home. He reports that he does remember to.   Patient reports nocturia.  Patient denies neuropathy. Patient denies visual changes. Patient reports self foot exams.   Patient reports UTI. He has to self-cath in order to produce a urine sample.  Patient would like to know what changes he can make to his diet to help with his cholesterol.    O:  Physical Exam   ROS   Lab Results  Component Value Date   HGBA1C 8.0 12/01/2016   There were no vitals filed for this visit.  POCT glucose = 211 (post-prandial)  A/P: Diabetes longstanding currently uncontrolled based on A1c of 8.0. Patient denies hypoglycemic events and is able to verbalize appropriate hypoglycemia management plan. Patient reports adherence with medication. Control is suboptimal due to lack of monitoring blood sugar.  Continue current medications - unable to make changes without any readings. Patient given log book and will monitor blood sugar and return in 1 month to see me for review  Concern for UTI, patient unable to give sample as we do not have an male catheters here. Discussed with PCP, and she recommended that patient go to urgent care for further evaluation.   Hyperlipidemia: reviewed cholesterol with patient and dietary changes he can make as well as reviewed fenofibrate with him. Will need to keep close watch on liver function while on fenofibrate.  Next A1C anticipated January 2019.    Written patient instructions  provided.  Total time in face to face counseling 20 minutes.   Follow up in Pharmacist Clinic Visit in 1 month.   Patient seen with Thornton Park, PharmD Candidate

## 2017-02-02 ENCOUNTER — Other Ambulatory Visit: Payer: Self-pay | Admitting: Family Medicine

## 2017-02-02 DIAGNOSIS — Z794 Long term (current) use of insulin: Secondary | ICD-10-CM

## 2017-02-02 DIAGNOSIS — E785 Hyperlipidemia, unspecified: Secondary | ICD-10-CM

## 2017-02-02 DIAGNOSIS — I2510 Atherosclerotic heart disease of native coronary artery without angina pectoris: Secondary | ICD-10-CM

## 2017-02-02 DIAGNOSIS — E1165 Type 2 diabetes mellitus with hyperglycemia: Secondary | ICD-10-CM

## 2017-02-02 MED ORDER — OMEGA-3-ACID ETHYL ESTERS 1 G PO CAPS: 2.0000 g | ORAL_CAPSULE | Freq: Two times a day (BID) | ORAL | 0 refills | Status: DC

## 2017-02-08 ENCOUNTER — Other Ambulatory Visit: Payer: Self-pay | Admitting: Internal Medicine

## 2017-02-08 DIAGNOSIS — E785 Hyperlipidemia, unspecified: Secondary | ICD-10-CM

## 2017-02-08 DIAGNOSIS — Z794 Long term (current) use of insulin: Secondary | ICD-10-CM

## 2017-02-08 DIAGNOSIS — E1165 Type 2 diabetes mellitus with hyperglycemia: Secondary | ICD-10-CM

## 2017-02-08 DIAGNOSIS — I2510 Atherosclerotic heart disease of native coronary artery without angina pectoris: Secondary | ICD-10-CM

## 2017-02-10 ENCOUNTER — Ambulatory Visit (HOSPITAL_COMMUNITY)
Admission: EM | Admit: 2017-02-10 | Discharge: 2017-02-10 | Disposition: A | Discharge status: Home / Self Care | Payer: Self-pay | Attending: Internal Medicine | Admitting: Internal Medicine

## 2017-02-10 ENCOUNTER — Encounter (HOSPITAL_COMMUNITY): Payer: Self-pay | Admitting: Emergency Medicine

## 2017-02-10 ENCOUNTER — Ambulatory Visit: Payer: Self-pay | Admitting: Family Medicine

## 2017-02-10 DIAGNOSIS — I10 Essential (primary) hypertension: Secondary | ICD-10-CM | POA: Insufficient documentation

## 2017-02-10 DIAGNOSIS — N390 Urinary tract infection, site not specified: Secondary | ICD-10-CM | POA: Insufficient documentation

## 2017-02-10 DIAGNOSIS — Z955 Presence of coronary angioplasty implant and graft: Secondary | ICD-10-CM | POA: Insufficient documentation

## 2017-02-10 DIAGNOSIS — E669 Obesity, unspecified: Secondary | ICD-10-CM | POA: Insufficient documentation

## 2017-02-10 DIAGNOSIS — I2510 Atherosclerotic heart disease of native coronary artery without angina pectoris: Secondary | ICD-10-CM | POA: Insufficient documentation

## 2017-02-10 DIAGNOSIS — Z888 Allergy status to other drugs, medicaments and biological substances status: Secondary | ICD-10-CM | POA: Insufficient documentation

## 2017-02-10 DIAGNOSIS — Z8249 Family history of ischemic heart disease and other diseases of the circulatory system: Secondary | ICD-10-CM | POA: Insufficient documentation

## 2017-02-10 DIAGNOSIS — E1165 Type 2 diabetes mellitus with hyperglycemia: Secondary | ICD-10-CM | POA: Insufficient documentation

## 2017-02-10 DIAGNOSIS — Z87891 Personal history of nicotine dependence: Secondary | ICD-10-CM | POA: Insufficient documentation

## 2017-02-10 DIAGNOSIS — N400 Enlarged prostate without lower urinary tract symptoms: Secondary | ICD-10-CM | POA: Insufficient documentation

## 2017-02-10 DIAGNOSIS — Z833 Family history of diabetes mellitus: Secondary | ICD-10-CM | POA: Insufficient documentation

## 2017-02-10 DIAGNOSIS — Z7982 Long term (current) use of aspirin: Secondary | ICD-10-CM | POA: Insufficient documentation

## 2017-02-10 DIAGNOSIS — Z79899 Other long term (current) drug therapy: Secondary | ICD-10-CM | POA: Insufficient documentation

## 2017-02-10 DIAGNOSIS — E785 Hyperlipidemia, unspecified: Secondary | ICD-10-CM | POA: Insufficient documentation

## 2017-02-10 DIAGNOSIS — Z9889 Other specified postprocedural states: Secondary | ICD-10-CM | POA: Insufficient documentation

## 2017-02-10 DIAGNOSIS — R300 Dysuria: Secondary | ICD-10-CM

## 2017-02-10 DIAGNOSIS — I252 Old myocardial infarction: Secondary | ICD-10-CM | POA: Insufficient documentation

## 2017-02-10 DIAGNOSIS — Z794 Long term (current) use of insulin: Secondary | ICD-10-CM | POA: Insufficient documentation

## 2017-02-10 LAB — POCT URINALYSIS DIP (DEVICE)
Bilirubin Urine: NEGATIVE
GLUCOSE, UA: 500 mg/dL — AB
Ketones, ur: NEGATIVE mg/dL
NITRITE: POSITIVE — AB
PROTEIN: NEGATIVE mg/dL
SPECIFIC GRAVITY, URINE: 1.02 (ref 1.005–1.030)
UROBILINOGEN UA: 1 mg/dL (ref 0.0–1.0)
pH: 7 (ref 5.0–8.0)

## 2017-02-10 MED ORDER — CEFTRIAXONE SODIUM 1 G IJ SOLR
1.0000 g | Freq: Once | INTRAMUSCULAR | Status: AC
  Administered 2017-02-10: 1 g via INTRAMUSCULAR

## 2017-02-10 MED ORDER — CEPHALEXIN 500 MG PO CAPS: 500.0000 mg | ORAL_CAPSULE | Freq: Four times a day (QID) | ORAL | 0 refills | Status: DC

## 2017-02-10 MED ORDER — CEFTRIAXONE SODIUM 1 G IJ SOLR
INTRAMUSCULAR | Status: AC
  Filled 2017-02-10: qty 10

## 2017-02-10 MED ORDER — LIDOCAINE HCL (PF) 1 % IJ SOLN
INTRAMUSCULAR | Status: AC
  Filled 2017-02-10: qty 2

## 2017-02-10 MED FILL — CEPHALEXIN 500 MG CAPSULE: 500 | 8 days supply | Qty: 32 | Fill #0

## 2017-02-10 NOTE — Discharge Instructions (Signed)
Take medication as directed, lots of water. If worse, new symptoms or problems return, follow up with your doctor or go to the emergency department. Read your accompanying instructions.

## 2017-02-10 NOTE — ED Triage Notes (Signed)
PT C/O: UTI sx associated w/cloudy urine, foul odor, urinary frequency/urgency.... Pt reports he has been using self caths since 1999 due to an old back inj.... Hx of UTIs   ONSET: 1 week  DENIES: fevers  TAKING MEDS: none   A&O x4... NAD... Ambulatory

## 2017-02-10 NOTE — ED Provider Notes (Signed)
[facility]    CSN: [id number] Arrival date & time: 02/10/17  1427     History   Chief Complaint Chief Complaint  Patient presents with  . Urinary Tract Infection    HPI Shane Bond is a 56 y.o. male.   55 year old male with a history of type 2 diabetes as well has a back injury which reduced nerve damage to one as to nerve level and has to cath himself frequently. He presents with a one-week history of dysuria, urgency, cloudy urine and some malaise. Denies fevers.      Past Medical History:  Diagnosis Date  . 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 CAD, NATIVE VESSEL 10/29/2009  . Diabetes mellitus without complication ([place])   . OBESITY 10/22/2009    Patient Active Problem List   Diagnosis Date Noted  . Diabetes mellitus without complication ([place]) 55/73/2202  . Former smoker 11/02/2015  . Diabetes type 2, uncontrolled ([place]) 06/18/2015  . Hyperlipidemia 06/18/2015  . BPH (benign prostatic hyperplasia) 06/18/2015  . Self-catheterizes urinary bladder 06/18/2015  . HTN (hypertension) 07/08/2010  . CAD, NATIVE VESSEL 10/29/2009  . 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 CORONARY STENT PLACEMENT    . CYSTOSCOPY W/ URETERAL STENT PLACEMENT  02/26/2012   Procedure: CYSTOSCOPY WITH RETROGRADE PYELOGRAM/URETERAL STENT PLACEMENT;  Surgeon: Bernestine Amass, MD;  Location: [facility];  Service: Urology;  Laterality: Left;       Home Medications    Prior to Admission medications   Medication Sig Start Date End Date Taking? Authorizing Provider  aspirin EC 81 MG tablet Take 81 mg by mouth daily.   Yes [provider]  glipiZIDE (GLUCOTROL) 5 MG tablet TAKE 1 TABLET (5 MG TOTAL) BY MOUTH 2 (TWO) TIMES DAILY BEFORE A MEAL. 08/02/16  Yes Funches, Josalyn, MD  glucose blood (TRUE METRIX BLOOD GLUCOSE TEST) test strip Use as instructed 12/01/16  Yes Hairston, Mandesia R, FNP  Insulin Glargine (LANTUS SOLOSTAR) 100 UNIT/ML  Solostar Pen Inject 18 Units into the skin daily at 10 pm. 12/13/16  Yes Jegede, Olugbemiga E, MD  Insulin Pen Needle (TRUEPLUS PEN NEEDLES) 32G X 4 MM MISC USE AS DIRECTED 12/01/16  Yes Hairston, Mandesia R, FNP  lisinopril (PRINIVIL,ZESTRIL) 5 MG tablet Take 1 tablet (5 mg total) by mouth daily. 12/01/16  Yes Hairston, Mandesia R, FNP  metFORMIN (GLUCOPHAGE) 1000 MG tablet TAKE 1 TABLET BY MOUTH 2 TIMES DAILY WITH A MEAL. 12/22/16  Yes Hairston, Maylon Peppers, FNP  omega-3 acid ethyl esters (LOVAZA) 1 g capsule Take 2 capsules (2 g total) by mouth 2 (two) times daily. 02/02/17  Yes Alfonse Spruce, FNP  acetaminophen (TYLENOL) 500 MG tablet Take 1,000 mg by mouth every 6 (six) hours as needed for mild pain, moderate pain or headache. Reported on 06/18/2015    [provider]  Blood Glucose Monitoring Suppl (TRUE METRIX METER) w/Device KIT 1 each by Does not apply route 3 (three) times daily. 05/28/15   Argentina Donovan, PA-C  cephALEXin (KEFLEX) 500 MG capsule Take 1 capsule (500 mg total) by mouth 4 (four) times daily. 02/10/17   Janne Napoleon, NP  fenofibrate 160 MG tablet Take 1 tablet (160 mg total) by mouth daily. 12/20/16   Alfonse Spruce, FNP  tamsulosin (FLOMAX) 0.4 MG CAPS capsule TAKE 2 CAPSULES BY MOUTH DAILY. 08/02/16   [name], MD  TRUEPLUS LANCETS 28G MISC 1 each by Does not apply route  4 (four) times daily - after meals and at bedtime. 12/01/16   Alfonse Spruce, FNP    Family History Family History  Problem Relation Age of Onset  . Diabetes Father   . Coronary artery disease Other   . Heart attack Other     Social History Social History   Tobacco Use  . Smoking status: Former Research scientist (life sciences)  . Smokeless tobacco: Former Network engineer Use Topics  . Alcohol use: No  . Drug use: No     Allergies   Lipitor [atorvastatin]   Review of Systems Review of Systems  Constitutional: Negative for activity change and fever.  HENT: Negative.     Respiratory: Negative.   Genitourinary: Positive for dysuria, frequency and urgency.  All other systems reviewed and are negative.    Physical Exam Triage Vital Signs ED Triage Vitals  Enc Vitals Group     BP 02/10/17 1442 125/86     Pulse Rate 02/10/17 1442 (!) 117     Resp 02/10/17 1442 16     Temp 02/10/17 1442 98.3 F (36.8 C)     Temp Source 02/10/17 1442 Oral     SpO2 02/10/17 1442 97 %     Weight --      Height --      Head Circumference --      Peak Flow --      Pain Score 02/10/17 1444 3     Pain Loc --      Pain Edu? --      Excl. in [place]? --    No data found.  Updated Vital Signs BP 125/86 (BP Location: Left Arm)   Pulse (!) 117   Temp 98.3 F (36.8 C) (Oral)   Resp 16   SpO2 97%   Visual Acuity Right Eye Distance:   Left Eye Distance:   Bilateral Distance:    Right Eye Near:   Left Eye Near:    Bilateral Near:     Physical Exam  Constitutional: He is oriented to person, place, and time. He appears well-developed and well-nourished. No distress.  Eyes: EOM are normal.  Neck: Neck supple.  Cardiovascular: Normal rate.  Pulmonary/Chest: Effort normal. No respiratory distress.  Musculoskeletal: He exhibits no edema.  Neurological: He is alert and oriented to person, place, and time. He exhibits normal muscle tone.  Skin: Skin is warm and dry.  Psychiatric: He has a normal mood and affect.  Nursing note and vitals reviewed.    UC Treatments / Results  Labs (all labs ordered are listed, but only abnormal results are displayed) Labs Reviewed  POCT URINALYSIS DIP (DEVICE) - Abnormal; Notable for the following components:      Result Value   Glucose, UA 500 (*)    Hgb urine dipstick LARGE (*)    Nitrite POSITIVE (*)    Leukocytes, UA TRACE (*)    All other components within normal limits  URINE CULTURE    EKG  EKG Interpretation None       Radiology No results found.  Procedures Procedures (including critical care  time)  Medications Ordered in UC Medications  cefTRIAXone (ROCEPHIN) injection 1 g (not administered)     Initial Impression / Assessment and Plan / UC Course  I have reviewed the triage vital signs and the nursing notes.  Pertinent labs & imaging results that were available during my care of the patient were reviewed by me and considered in my medical decision making (see chart for  details).        Final Clinical Impressions(s) / UC Diagnoses   Final diagnoses:  Lower urinary tract infectious disease    ED Discharge Orders        Ordered    cephALEXin (KEFLEX) 500 MG capsule  4 times daily     02/10/17 1547       Controlled Substance Prescriptions [place] Controlled Substance Registry consulted? Not Applicable   Janne Napoleon, NP 02/10/17 1555

## 2017-02-13 LAB — URINE CULTURE
Culture: >=100000 — AB
SPECIAL REQUESTS: NORMAL

## 2017-02-15 ENCOUNTER — Other Ambulatory Visit: Payer: Self-pay | Admitting: Internal Medicine

## 2017-02-15 DIAGNOSIS — Z794 Long term (current) use of insulin: Secondary | ICD-10-CM

## 2017-02-15 DIAGNOSIS — I2510 Atherosclerotic heart disease of native coronary artery without angina pectoris: Secondary | ICD-10-CM

## 2017-02-15 DIAGNOSIS — E785 Hyperlipidemia, unspecified: Secondary | ICD-10-CM

## 2017-02-15 DIAGNOSIS — E1165 Type 2 diabetes mellitus with hyperglycemia: Secondary | ICD-10-CM

## 2017-02-15 MED FILL — FENOFIBRATE 160 MG TABLET: 160 | 30 days supply | Qty: 30 | Fill #2

## 2017-02-15 MED FILL — ?LISINOPRIL 5 MG TABLET: 5 | 30 days supply | Qty: 30 | Fill #2

## 2017-02-15 MED FILL — $LANTUS SOLOSTAR 100 UNITS/: 100 | 33 days supply | Qty: 6 | Fill #2

## 2017-02-15 MED FILL — ?METFORMIN HCL 1,000 MG TAB: 1000 | 30 days supply | Qty: 60 | Fill #2

## 2017-02-17 ENCOUNTER — Other Ambulatory Visit: Payer: Self-pay | Admitting: Internal Medicine

## 2017-02-17 DIAGNOSIS — I2510 Atherosclerotic heart disease of native coronary artery without angina pectoris: Secondary | ICD-10-CM

## 2017-02-17 DIAGNOSIS — E785 Hyperlipidemia, unspecified: Secondary | ICD-10-CM

## 2017-02-17 DIAGNOSIS — E1165 Type 2 diabetes mellitus with hyperglycemia: Secondary | ICD-10-CM

## 2017-02-17 DIAGNOSIS — Z794 Long term (current) use of insulin: Secondary | ICD-10-CM

## 2017-03-22 ENCOUNTER — Other Ambulatory Visit: Payer: Self-pay | Admitting: Family Medicine

## 2017-03-22 DIAGNOSIS — Z794 Long term (current) use of insulin: Principal | ICD-10-CM

## 2017-03-22 DIAGNOSIS — E1165 Type 2 diabetes mellitus with hyperglycemia: Secondary | ICD-10-CM

## 2017-03-22 MED FILL — $LANTUS SOLOSTAR 100 UNITS/: 100 | 33 days supply | Qty: 6 | Fill #3

## 2017-03-22 MED FILL — ?GLIPIZIDE 5MG TABLET: 5 | 30 days supply | Qty: 60 | Fill #5

## 2017-03-22 MED FILL — OMEGA-3 ETHYL ESTERS 1 GM C: 1 | 30 days supply | Qty: 120 | Fill #0

## 2017-03-22 MED FILL — FENOFIBRATE 160 MG TABLET: 160 | 30 days supply | Qty: 30 | Fill #3

## 2017-03-23 MED FILL — ?METFORMIN HCL 1,000 MG TAB: 1000 | 30 days supply | Qty: 60 | Fill #0

## 2017-03-24 ENCOUNTER — Ambulatory Visit: Payer: Self-pay | Attending: Family Medicine

## 2017-03-30 ENCOUNTER — Ambulatory Visit: Payer: Self-pay | Admitting: Family Medicine

## 2017-04-07 ENCOUNTER — Encounter: Payer: Self-pay | Admitting: Family Medicine

## 2017-04-07 ENCOUNTER — Other Ambulatory Visit: Payer: Self-pay

## 2017-04-07 ENCOUNTER — Ambulatory Visit: Payer: Self-pay | Attending: Family Medicine | Admitting: Family Medicine

## 2017-04-07 VITALS — BP 110/70 | HR 97 | Temp 98.6°F | Resp 16 | Ht 74.0 in | Wt 258.4 lb

## 2017-04-07 DIAGNOSIS — L602 Onychogryphosis: Secondary | ICD-10-CM

## 2017-04-07 DIAGNOSIS — Z79899 Other long term (current) drug therapy: Secondary | ICD-10-CM | POA: Insufficient documentation

## 2017-04-07 DIAGNOSIS — B353 Tinea pedis: Secondary | ICD-10-CM | POA: Insufficient documentation

## 2017-04-07 DIAGNOSIS — Z23 Encounter for immunization: Secondary | ICD-10-CM

## 2017-04-07 DIAGNOSIS — E1142 Type 2 diabetes mellitus with diabetic polyneuropathy: Secondary | ICD-10-CM | POA: Insufficient documentation

## 2017-04-07 DIAGNOSIS — I10 Essential (primary) hypertension: Secondary | ICD-10-CM | POA: Insufficient documentation

## 2017-04-07 DIAGNOSIS — Z1211 Encounter for screening for malignant neoplasm of colon: Secondary | ICD-10-CM

## 2017-04-07 DIAGNOSIS — Z794 Long term (current) use of insulin: Secondary | ICD-10-CM | POA: Insufficient documentation

## 2017-04-07 DIAGNOSIS — Z7982 Long term (current) use of aspirin: Secondary | ICD-10-CM | POA: Insufficient documentation

## 2017-04-07 DIAGNOSIS — I2510 Atherosclerotic heart disease of native coronary artery without angina pectoris: Secondary | ICD-10-CM | POA: Insufficient documentation

## 2017-04-07 DIAGNOSIS — E785 Hyperlipidemia, unspecified: Secondary | ICD-10-CM | POA: Insufficient documentation

## 2017-04-07 LAB — GLUCOSE, POCT (MANUAL RESULT ENTRY): POC Glucose: 211 mg/dl — AB (ref 70–99)

## 2017-04-07 MED ORDER — KETOCONAZOLE 2 % EX CREA: 1.0000 "application " | TOPICAL_CREAM | Freq: Every day | CUTANEOUS | 0 refills | Status: DC

## 2017-04-07 MED ORDER — GABAPENTIN 300 MG PO CAPS: 300.0000 mg | ORAL_CAPSULE | Freq: Every day | ORAL | 2 refills | Status: DC

## 2017-04-07 MED FILL — KETOCONAZOLE 2% CREAM: 2 | 14 days supply | Qty: 15 | Fill #0

## 2017-04-07 MED FILL — GABAPENTIN 300 MG CAPSULE: 300 | 30 days supply | Qty: 30 | Fill #0

## 2017-04-07 NOTE — Progress Notes (Signed)
F/u DM

## 2017-04-07 NOTE — Patient Instructions (Addendum)
Go to pharmacy for flu vaccination.    Blood Glucose Monitoring, Adult Monitoring your blood sugar (glucose) helps you manage your diabetes. It also helps you and your health care provider determine how well your diabetes management plan is working. Blood glucose monitoring involves checking your blood glucose as often as directed, and keeping a record (log) of your results over time. Why should I monitor my blood glucose? Checking your blood glucose regularly can:  Help you understand how food, exercise, illnesses, and medicines affect your blood glucose.  Let you know what your blood glucose is at any time. You can quickly tell if you are having low blood glucose (hypoglycemia) or high blood glucose (hyperglycemia).  Help you and your health care provider adjust your medicines as needed.  When should I check my blood glucose? Follow instructions from your health care provider about how often to check your blood glucose. This may depend on:  The type of diabetes you have.  How well-controlled your diabetes is.  Medicines you are taking.  If you have type 1 diabetes:  Check your blood glucose at least 2 times a day.  Also check your blood glucose: ? Before every insulin injection. ? Before and after exercise. ? Between meals. ? 2 hours after a meal. ? Occasionally between 2:00 a.m. and 3:00 a.m., as directed. ? Before potentially dangerous tasks, like driving or using heavy machinery. ? At bedtime.  You may need to check your blood glucose more often, up to 6-10 times a day: ? If you use an insulin pump. ? If you need multiple daily injections (MDI). ? If your diabetes is not well-controlled. ? If you are ill. ? If you have a history of severe hypoglycemia. ? If you have a history of not knowing when your blood glucose is getting low (hypoglycemia unawareness). If you have type 2 diabetes:  If you take insulin or other diabetes medicines, check your blood glucose at least  2 times a day.  If you are on intensive insulin therapy, check your blood glucose at least 4 times a day. Occasionally, you may also need to check between 2:00 a.m. and 3:00 a.m., as directed.  Also check your blood glucose: ? Before and after exercise. ? Before potentially dangerous tasks, like driving or using heavy machinery.  You may need to check your blood glucose more often if: ? Your medicine is being adjusted. ? Your diabetes is not well-controlled. ? You are ill. What is a blood glucose log?  A blood glucose log is a record of your blood glucose readings. It helps you and your health care provider: ? Look for patterns in your blood glucose over time. ? Adjust your diabetes management plan as needed.  Every time you check your blood glucose, write down your result and notes about things that may be affecting your blood glucose, such as your diet and exercise for the day.  Most glucose meters store a record of glucose readings in the meter. Some meters allow you to download your records to a computer. How do I check my blood glucose? Follow these steps to get accurate readings of your blood glucose: Supplies needed   Blood glucose meter.  Test strips for your meter. Each meter has its own strips. You must use the strips that come with your meter.  A needle to prick your finger (lancet). Do not use lancets more than once.  A device that holds the lancet (lancing device).  A journal or  log book to write down your results. Procedure  Wash your hands with soap and water.  Prick the side of your finger (not the tip) with the lancet. Use a different finger each time.  Gently rub the finger until a small drop of blood appears.  Follow instructions that come with your meter for inserting the test strip, applying blood to the strip, and using your blood glucose meter.  Write down your result and any notes. Alternative testing sites  Some meters allow you to use areas of  your body other than your finger (alternative sites) to test your blood.  If you think you may have hypoglycemia, or if you have hypoglycemia unawareness, do not use alternative sites. Use your finger instead.  Alternative sites may not be as accurate as the fingers, because blood flow is slower in these areas. This means that the result you get may be delayed, and it may be different from the result that you would get from your finger.  The most common alternative sites are: ? Forearm. ? Thigh. ? Palm of the hand. Additional tips  Always keep your supplies with you.  If you have questions or need help, all blood glucose meters have a 24-hour "hotline" number that you can call. You may also contact your health care provider.  After you use a few boxes of test strips, adjust (calibrate) your blood glucose meter by following instructions that came with your meter. This information is not intended to replace advice given to you by your health care provider. Make sure you discuss any questions you have with your health care provider. Document Released: 02/10/2003 Document Revised: 08/28/2015 Document Reviewed: 07/20/2015 Elsevier Interactive Patient Education  2017 [employer].

## 2017-04-07 NOTE — Progress Notes (Signed)
Subjective:  Patient ID: ARLIND KLINGERMAN, male    DOB: 1960/03/31  Age: 57 y.o. MRN: [id number]  CC: Follow-up   HPI Shane Bond presents for  follow up. PMH includes DM, HLD,CAD, and HTN. History of DM. Symptoms: none Patient denies foot ulcerations, feet paresthesias, nausea, polydipsia, polyuria, visual disturbances and vomiting. He does reports sensation of coolness to bilateral feet. Symptom is intermittent. He denies any swelling, cramping, wounds, or color changes. Evaluation to date has been included: fasting blood sugar, fasting lipid panel and hemoglobin A1C.  Home sugars: he does not check CBG's.  Treatment to date: metformin, glipizide, lantus. History of HTN/HLD.  He is not exercising and is not adherent to low salt diet.  He does not check BP at home. Cardiac symptoms none. Patient denies chest pain, chest pressure/discomfort, claudication, dyspnea, lower extremity edema, near-syncope, palpitations and syncope.  Cardiovascular risk factors: dyslipidemia, hypertension, male gender and sedentary lifestyle. Use of agents associated with hypertension: none. History of target organ damage: none  Outpatient Medications Prior to Visit  Medication Sig Dispense Refill  . aspirin EC 81 MG tablet Take 81 mg by mouth daily.    . fenofibrate 160 MG tablet Take 1 tablet (160 mg total) by mouth daily. 30 tablet 3  . glipiZIDE (GLUCOTROL) 5 MG tablet TAKE 1 TABLET (5 MG TOTAL) BY MOUTH 2 (TWO) TIMES DAILY BEFORE A MEAL. 180 tablet 2  . Insulin Glargine (LANTUS SOLOSTAR) 100 UNIT/ML Solostar Pen Inject 18 Units into the skin daily at 10 pm. 15 mL 3  . Insulin Pen Needle (TRUEPLUS PEN NEEDLES) 32G X 4 MM MISC USE AS DIRECTED 100 each 12  . metFORMIN (GLUCOPHAGE) 1000 MG tablet TAKE 1 TABLET BY MOUTH 2 TIMES DAILY WITH A MEAL. 60 tablet 0  . omega-3 acid ethyl esters (LOVAZA) 1 g capsule TAKE 2 CAPSULES BY MOUTH 2 TIMES DAILY. 120 capsule 2  . lisinopril (PRINIVIL,ZESTRIL) 5 MG tablet Take 1  tablet (5 mg total) by mouth daily. 30 tablet 3  . tamsulosin (FLOMAX) 0.4 MG CAPS capsule TAKE 2 CAPSULES BY MOUTH DAILY. 30 capsule 0  . acetaminophen (TYLENOL) 500 MG tablet Take 1,000 mg by mouth every 6 (six) hours as needed for mild pain, moderate pain or headache. Reported on 06/18/2015    . glucose blood (TRUE METRIX BLOOD GLUCOSE TEST) test strip Use as instructed 100 each 12  . TRUEPLUS LANCETS 28G MISC 1 each by Does not apply route 4 (four) times daily - after meals and at bedtime. 100 each 12  . Blood Glucose Monitoring Suppl (TRUE METRIX METER) w/Device KIT 1 each by Does not apply route 3 (three) times daily. 1 kit 0  . cephALEXin (KEFLEX) 500 MG capsule Take 1 capsule (500 mg total) by mouth 4 (four) times daily. 32 capsule 0   Facility-Administered Medications Prior to Visit  Medication Dose Route Frequency Provider Last Rate Last Dose  . diphenhydrAMINE (BENADRYL) capsule 25 mg  25 mg Oral Once Tresa Garter, MD        Review of Systems  Constitutional: Negative.   Eyes: Negative.   Respiratory: Negative.   Cardiovascular: Negative.   Gastrointestinal: Negative.   Skin: Negative for color change and wound.       Temperature changes.    Objective:  BP 110/70 (BP Location: Right Arm, Patient Position: Sitting, Cuff Size: Large)   Pulse 97   Temp 98.6 F (37 C) (Oral)   Resp 16   Ht  6' 2" (1.88 m)   Wt 258 lb 6.4 oz (117.2 kg)   SpO2 97%   BMI 33.18 kg/m   BP/Weight 04/07/2017 02/10/2017 32/99/2426  Systolic BP 834 196 222  Diastolic BP 70 86 77  Wt. (Lbs) 258.4 - 258.8  BMI 33.18 - 34.14    Physical Exam  Vitals reviewed. Constitutional: He appears well-developed and well-nourished.  Eyes: Conjunctivae are normal. Pupils are equal, round, and reactive to light.  Neck: Normal range of motion. Neck supple. No JVD present.  Cardiovascular: Normal rate, regular rhythm, normal heart sounds and intact distal pulses.  Pulses:      Radial pulses are 2+ on  the right side, and 2+ on the left side.       Dorsalis pedis pulses are 2+ on the right side, and 2+ on the left side.  Respiratory: Effort normal and breath sounds normal.  GI: Soft. Bowel sounds are normal. There is no tenderness.  Musculoskeletal:       Right foot: There is normal capillary refill.       Left foot: There is normal capillary refill.  Skin: Skin is warm, dry and intact. Rash (bilateral feet plantar surface) noted. No cyanosis.  Elongated toenails.   Psychiatric: He has a normal mood and affect. He expresses no homicidal and no suicidal ideation. He expresses no suicidal plans and no homicidal plans.     Assessment & Plan:   1. Type 2 diabetes mellitus with diabetic polyneuropathy, with long-term current use of insulin (HCC) Encouraged checking CBG's regularly.  - Glucose (CBG) - Ambulatory referral to Ophthalmology - Hemoglobin A1c - Lipid Panel - Basic metabolic panel - gabapentin (NEURONTIN) 300 MG capsule; Take 1 capsule (300 mg total) by mouth at bedtime.  Dispense: 30 capsule; Refill: 2 - Ambulatory referral to Podiatry  2. Essential hypertension  - Lipid Panel - Basic metabolic panel  3. Need for pneumococcal vaccine Go to [place] for immunization.   4. Screen for colon cancer  - Fecal occult blood, imunochemical([facility])  5. Tinea pedis of both feet Refer to HPI. No cyanosis, swelling, or wounds to bilateral feet.  Recommend follow-up if symptoms persist. Rash consistent with tinea pedis present. - ketoconazole (NIZORAL) 2 % cream; Apply 1 application topically daily. Apply to affected areas of feet for 14 days.  Dispense: 15 g; Refill: 0  6. Overgrown toenails  - Ambulatory referral to Podiatry     Follow-up: Return in about 3 months (around 07/05/2017), or if symptoms worsen or fail to improve, for DM .   Alfonse Spruce FNP

## 2017-04-08 LAB — LIPID PANEL
CHOLESTEROL TOTAL: 195 mg/dL (ref 100–199)
Chol/HDL Ratio: 5.4 ratio — ABNORMAL HIGH (ref 0.0–5.0)
HDL: 36 mg/dL — ABNORMAL LOW (ref >39)
LDL CALC: 123 mg/dL — AB (ref 0–99)
Triglycerides: 179 mg/dL — ABNORMAL HIGH (ref 0–149)
VLDL CHOLESTEROL CAL: 36 mg/dL (ref 5–40)

## 2017-04-08 LAB — HEMOGLOBIN A1C
Est. average glucose Bld gHb Est-mCnc: 263 mg/dL
Hgb A1c MFr Bld: 10.8 % — ABNORMAL HIGH (ref 4.8–5.6)

## 2017-04-08 LAB — BASIC METABOLIC PANEL
BUN/Creatinine Ratio: 15 (ref 9–20)
BUN: 17 mg/dL (ref 6–24)
CO2: 23 mmol/L (ref 20–29)
Calcium: 10.6 mg/dL — ABNORMAL HIGH (ref 8.7–10.2)
Chloride: 97 mmol/L (ref 96–106)
Creatinine, Ser: 1.11 mg/dL (ref 0.76–1.27)
GFR, EST AFRICAN AMERICAN: 85 mL/min/{1.73_m2} (ref >59)
GFR, EST NON AFRICAN AMERICAN: 74 mL/min/{1.73_m2} (ref >59)
Glucose: 186 mg/dL — ABNORMAL HIGH (ref 65–99)
POTASSIUM: 5.2 mmol/L (ref 3.5–5.2)
Sodium: 139 mmol/L (ref 134–144)

## 2017-04-13 ENCOUNTER — Other Ambulatory Visit: Payer: Self-pay | Admitting: Family Medicine

## 2017-04-13 ENCOUNTER — Telehealth: Payer: Self-pay

## 2017-04-13 DIAGNOSIS — I2510 Atherosclerotic heart disease of native coronary artery without angina pectoris: Secondary | ICD-10-CM

## 2017-04-13 DIAGNOSIS — E785 Hyperlipidemia, unspecified: Secondary | ICD-10-CM

## 2017-04-13 DIAGNOSIS — E1159 Type 2 diabetes mellitus with other circulatory complications: Secondary | ICD-10-CM | POA: Insufficient documentation

## 2017-04-13 DIAGNOSIS — E119 Type 2 diabetes mellitus without complications: Secondary | ICD-10-CM

## 2017-04-13 DIAGNOSIS — Z794 Long term (current) use of insulin: Principal | ICD-10-CM

## 2017-04-13 DIAGNOSIS — E1165 Type 2 diabetes mellitus with hyperglycemia: Secondary | ICD-10-CM

## 2017-04-13 MED ORDER — INSULIN GLARGINE 100 UNIT/ML SOLOSTAR PEN: 20.0000 [IU] | PEN_INJECTOR | Freq: Every day | SUBCUTANEOUS | 3 refills | Status: DC

## 2017-04-13 MED ORDER — SITAGLIPTIN PHOSPHATE 50 MG PO TABS: 50.0000 mg | ORAL_TABLET | Freq: Every day | ORAL | 2 refills | Status: DC

## 2017-04-13 MED ORDER — OMEGA-3-ACID ETHYL ESTERS 1 G PO CAPS: ORAL_CAPSULE | ORAL | 2 refills | Status: DC

## 2017-04-13 MED ORDER — GEMFIBROZIL 600 MG PO TABS: 600.0000 mg | ORAL_TABLET | Freq: Two times a day (BID) | ORAL | 2 refills | Status: DC

## 2017-04-13 NOTE — Telephone Encounter (Signed)
Patient was called and informed to contact office for lab results.   If patient returns phone call please inform patient  Diabetes screening is not a goal. Hgba1c is 10.8 goal is 7.0 or less.  You will be prescribed Janvuia. Lantus will be increased from 18 units to 20 units.  Lipid levels were elevated. This can increase your risk of heart disease overtime. You will be prescribed gemfibrozil and lovaza. Stop fenofibrate use. Start eating a diet low in saturated fat. Limit your intake of fried foods, red meats, and whole milk.  Calcium level is significantly level compared to previous test. Recommend lab only visit to screen for vitamin d level and parathyroid function.  -Continue to take your diabetic medications.  -Start eating a carbohydrate modified diet. Avoid eating large amounts of starches, white bread, rice, and sugar.  -Keep a log of your blood sugar levels to bring to your next office visit.  Recommend follow up in 6 weeks for diabetes.

## 2017-04-26 MED FILL — glipiZIDE 5 MG TABS: 5 | 30 days supply | Qty: 60 | Fill #6

## 2017-04-26 MED FILL — LISINOPRIL 5 MG TAB: 5 | 30 days supply | Qty: 30 | Fill #3

## 2017-04-26 MED FILL — OMEGA-3 ETHYL ESTERS 1 GM C: 1 | 30 days supply | Qty: 120 | Fill #0

## 2017-04-26 MED FILL — $LANTUS SOLOSTAR 100 UNITS/: 100 | 30 days supply | Qty: 6 | Fill #0

## 2017-05-24 MED FILL — glipiZIDE 5 MG TABS: 5 | 30 days supply | Qty: 60 | Fill #7

## 2017-05-24 MED FILL — $LANTUS SOLOSTAR 100 UNITS/: 100 | 30 days supply | Qty: 6 | Fill #1

## 2017-06-28 MED FILL — $LANTUS SOLOSTAR 100 UNITS/: 100 | 30 days supply | Qty: 6 | Fill #2

## 2017-07-21 MED FILL — TRUEPLUS PEN NDL 32GX5/32: 32GX 5/32" | 30 days supply | Qty: 100 | Fill #1

## 2017-07-21 MED FILL — TRUEPLUS PEN NDL 32GX5/32": 32GX 5/32" | 30 days supply | Qty: 100 | Fill #1

## 2017-07-25 MED FILL — $LANTUS SOLOSTAR 100 UNITS/: 100 | 30 days supply | Qty: 6 | Fill #3

## 2017-07-25 MED FILL — glipiZIDE 5 MG TABS: 5 | 30 days supply | Qty: 60 | Fill #8

## 2017-08-17 MED FILL — OMEGA-3 ETHYL ESTERS 1 GM C: 1 | 30 days supply | Qty: 120 | Fill #1

## 2017-08-18 ENCOUNTER — Encounter: Payer: Self-pay | Admitting: Nurse Practitioner

## 2017-08-18 ENCOUNTER — Ambulatory Visit: Payer: Self-pay | Attending: Nurse Practitioner | Admitting: Nurse Practitioner

## 2017-08-18 VITALS — BP 123/86 | HR 113 | Temp 98.2°F | Ht 72.0 in | Wt 242.0 lb

## 2017-08-18 DIAGNOSIS — I10 Essential (primary) hypertension: Secondary | ICD-10-CM | POA: Insufficient documentation

## 2017-08-18 DIAGNOSIS — E785 Hyperlipidemia, unspecified: Secondary | ICD-10-CM | POA: Insufficient documentation

## 2017-08-18 DIAGNOSIS — I2510 Atherosclerotic heart disease of native coronary artery without angina pectoris: Secondary | ICD-10-CM | POA: Insufficient documentation

## 2017-08-18 DIAGNOSIS — E1140 Type 2 diabetes mellitus with diabetic neuropathy, unspecified: Secondary | ICD-10-CM | POA: Insufficient documentation

## 2017-08-18 DIAGNOSIS — E1165 Type 2 diabetes mellitus with hyperglycemia: Secondary | ICD-10-CM | POA: Insufficient documentation

## 2017-08-18 DIAGNOSIS — I252 Old myocardial infarction: Secondary | ICD-10-CM | POA: Insufficient documentation

## 2017-08-18 DIAGNOSIS — Z7982 Long term (current) use of aspirin: Secondary | ICD-10-CM | POA: Insufficient documentation

## 2017-08-18 DIAGNOSIS — E669 Obesity, unspecified: Secondary | ICD-10-CM | POA: Insufficient documentation

## 2017-08-18 DIAGNOSIS — Z8249 Family history of ischemic heart disease and other diseases of the circulatory system: Secondary | ICD-10-CM | POA: Insufficient documentation

## 2017-08-18 DIAGNOSIS — Z794 Long term (current) use of insulin: Secondary | ICD-10-CM | POA: Insufficient documentation

## 2017-08-18 DIAGNOSIS — Z760 Encounter for issue of repeat prescription: Secondary | ICD-10-CM | POA: Insufficient documentation

## 2017-08-18 DIAGNOSIS — Z79899 Other long term (current) drug therapy: Secondary | ICD-10-CM | POA: Insufficient documentation

## 2017-08-18 DIAGNOSIS — N400 Enlarged prostate without lower urinary tract symptoms: Secondary | ICD-10-CM | POA: Insufficient documentation

## 2017-08-18 DIAGNOSIS — Z87438 Personal history of other diseases of male genital organs: Secondary | ICD-10-CM

## 2017-08-18 LAB — POCT GLYCOSYLATED HEMOGLOBIN (HGB A1C): Hemoglobin A1C: 11.2 % — AB (ref 4.0–5.6)

## 2017-08-18 LAB — GLUCOSE, POCT (MANUAL RESULT ENTRY): POC Glucose: 306 mg/dl — AB (ref 70–99)

## 2017-08-18 MED ORDER — GLIPIZIDE 5 MG PO TABS: 5.0000 mg | ORAL_TABLET | Freq: Two times a day (BID) | ORAL | 2 refills | Status: DC

## 2017-08-18 MED ORDER — LISINOPRIL 5 MG PO TABS: 5.0000 mg | ORAL_TABLET | Freq: Every day | ORAL | 3 refills | Status: DC

## 2017-08-18 MED ORDER — OMEGA-3-ACID ETHYL ESTERS 1 G PO CAPS: ORAL_CAPSULE | ORAL | 2 refills | Status: DC

## 2017-08-18 MED ORDER — GEMFIBROZIL 600 MG PO TABS: 600.0000 mg | ORAL_TABLET | Freq: Two times a day (BID) | ORAL | 2 refills | Status: DC

## 2017-08-18 MED ORDER — SITAGLIPTIN PHOS-METFORMIN HCL 50-1000 MG PO TABS: 1.0000 | ORAL_TABLET | Freq: Two times a day (BID) | ORAL | 3 refills | Status: DC

## 2017-08-18 MED ORDER — INSULIN PEN NEEDLE 32G X 4 MM MISC
12 refills | Status: DC
Start: 2017-08-18 — End: 2018-04-06

## 2017-08-18 MED ORDER — INSULIN GLARGINE 100 UNIT/ML SOLOSTAR PEN: 40.0000 [IU] | PEN_INJECTOR | Freq: Every day | SUBCUTANEOUS | 3 refills | Status: DC

## 2017-08-18 MED FILL — TRUEPLUS PEN NDL 32GX5/32": 32G X 4 MM | 30 days supply | Qty: 100 | Fill #2

## 2017-08-18 MED FILL — ?GLIPIZIDE 5MG TABLET: 5 | 30 days supply | Qty: 60 | Fill #0

## 2017-08-18 MED FILL — GEMFIBROZIL 600 MG TABS: 600 | 30 days supply | Qty: 60 | Fill #0

## 2017-08-18 MED FILL — LISINOPRIL 5 MG TAB: 5 | 90 days supply | Qty: 90 | Fill #0

## 2017-08-18 MED FILL — !JANUVIA 50 MG TABLET: 50 | 30 days supply | Qty: 30 | Fill #0

## 2017-08-18 MED FILL — TRUEPLUS PEN NDL 32GX5/32: 32G X 4 MM | 30 days supply | Qty: 100 | Fill #2

## 2017-08-18 NOTE — Progress Notes (Signed)
Assessment & Plan:  Shane Bond was seen today for establish care and medication refill.  Diagnoses and all orders for this visit:  History of BPH -     PSA  Uncontrolled type 2 diabetes mellitus with hyperglycemia, with long-term current use of insulin (HCC) -     Glucose (CBG) -     HgB A1c -     gemfibrozil (LOPID) 600 MG tablet; Take 1 tablet (600 mg total) by mouth 2 (two) times daily before a meal. -     glipiZIDE (GLUCOTROL) 5 MG tablet; Take 1 tablet (5 mg total) by mouth 2 (two) times daily before a meal. -     Insulin Glargine (LANTUS SOLOSTAR) 100 UNIT/ML Solostar Pen; Inject 40 Units into the skin daily at 10 pm. -     omega-3 acid ethyl esters (LOVAZA) 1 g capsule; TAKE 2 CAPSULES BY MOUTH 2 TIMES DAILY. -     sitaGLIPtin-metformin (JANUMET) 50-1000 MG tablet; Take 1 tablet by mouth 2 (two) times daily with a meal. -     Insulin Pen Needle (TRUEPLUS PEN NEEDLES) 32G X 4 MM MISC; USE AS DIRECTED -     Microalbumin / creatinine urine ratio -     lisinopril (PRINIVIL,ZESTRIL) 5 MG tablet; Take 1 tablet (5 mg total) by mouth daily. Continue blood sugar control as discussed in office today, low carbohydrate diet, and regular physical exercise as tolerated, 150 minutes per week (30 min each day, 5 days per week, or 50 min 3 days per week). Keep blood sugar logs with fasting goal of 80-130 mg/dl, post prandial less than 180.  For Hypoglycemia: BS <60 and Hyperglycemia BS >400; contact the clinic ASAP. Annual eye exams and foot exams are recommended.  Hyperlipidemia, unspecified hyperlipidemia type -     gemfibrozil (LOPID) 600 MG tablet; Take 1 tablet (600 mg total) by mouth 2 (two) times daily before a meal. -     omega-3 acid ethyl esters (LOVAZA) 1 g capsule; TAKE 2 CAPSULES BY MOUTH 2 TIMES DAILY. INSTRUCTIONS: Work on a low fat, heart healthy diet and participate in regular aerobic exercise program by working out at least 150 minutes per week. No fried foods. No junk foods,  sodas, sugary drinks, unhealthy snacking, alcohol or smoking.    Patient has been counseled on age-appropriate routine health concerns for screening and prevention. These are reviewed and up-to-date. Referrals have been placed accordingly. Immunizations are up-to-date or declined.    Subjective:   Chief Complaint  Patient presents with  . Establish Care    Pt. is here to establish care for diabetes and cholesterol. Pt. stated he only taking glipiizide and Omega 3 fish oil.   . Medication Refill   HPI Shane Bond 57 y.o. male presents to office today to establish care. He has a history of poorly controlled DM, Hyperlipidemia and a back injury which resulted in nerve damage causing him to now have to self cath. He also has a history of BPH. He needs to see a Dealer and has been urged to apply for the financial assistance program here.  He is accompanied by his sister today.    Type 2 Diabetes Mellitus Disease course has been are worsening. A1c up to 11.2 from 10.8. There are no hypoglycemic symptoms. There are no hypoglycemic complications. There are diabetic complications which include neuropathy (taking Gabapentin) . Risk factors for coronary artery disease include family history, dyslipidemia, diabetes mellitus, obesity, hypertension, sedentary lifestyle and stress. Current diabetic  treatment includes glipizide 5 mg BID, Lantus 38 units nightly (increased to 40units today), januvia 50 mg daily and metformin 1000 mg BID (will switch to Janumet today). Patient endorses compliance with treatment all of the time and monitors blood glucose at home inconsistently.  Weight is gradually decreasing. Patient is not diet compliant: eating cans of beans, white bread, starches/carbs.  Patient has not seen a dietician. Patient is not compliant with exercise.   An ACE inhibitor/angiotensin II receptor blocker is being taken. Patient does not see a podiatrist. Eye exam is not current. HE STILL HAS NOT  APPLIED FOR THE FINANCIAL ASSISTANCE PROGRAM.  Lab Results  Component Value Date   HGBA1C 11.2 (A) 08/18/2017   Hyperlipidemia Patient presents for follow up to hyperlipidemia.  He is medication compliant. However LDL is not at goal.  He endorses statin intolerance. Currently taking gemfibrozil and lovaza. May need to start on zetia. Lipid panel pending.   He is not diet compliant. Lab Results  Component Value Date   CHOL 195 04/07/2017   Lab Results  Component Value Date   HDL 36 (L) 04/07/2017   Lab Results  Component Value Date   LDLCALC 123 (H) 04/07/2017   Lab Results  Component Value Date   TRIG 179 (H) 04/07/2017   Lab Results  Component Value Date   CHOLHDL 5.4 (H) 04/07/2017   Lab Results  Component Value Date   LDLDIRECT 87.7 11/09/2009     Review of Systems  Constitutional: Negative for fever, malaise/fatigue and weight loss.  HENT: Negative.  Negative for nosebleeds.   Eyes: Negative.  Negative for blurred vision, double vision and photophobia.  Respiratory: Negative.  Negative for cough and shortness of breath.   Cardiovascular: Negative.  Negative for chest pain, palpitations and leg swelling.  Gastrointestinal: Negative.  Negative for heartburn, nausea and vomiting.  Genitourinary:       SEE HPI  Musculoskeletal: Negative.  Negative for myalgias.  Neurological: Positive for sensory change (NEUROPATHY). Negative for dizziness, focal weakness, seizures and headaches.  Psychiatric/Behavioral: Negative.  Negative for suicidal ideas.    Past Medical History:  Diagnosis Date  . 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 Bladder atonia   . BPH (benign prostatic hyperplasia)   . CAD, NATIVE VESSEL 10/29/2009  . Diabetes mellitus type 2, uncontrolled, with complications ([place])   . Hyperlipidemia LDL goal <70   . OBESITY 10/22/2009    Past Surgical History:  Procedure Laterality Date  . BACK SURGERY    . CORONARY STENT PLACEMENT    . CYSTOSCOPY W/ URETERAL STENT  PLACEMENT  02/26/2012   Procedure: CYSTOSCOPY WITH RETROGRADE PYELOGRAM/URETERAL STENT PLACEMENT;  Surgeon: Bernestine Amass, MD;  Location: [facility];  Service: Urology;  Laterality: Left;    Family History  Problem Relation Age of Onset  . Diabetes Father   . Coronary artery disease Other   . Heart attack Other   . Diabetes Paternal Aunt     Social History Reviewed with no changes to be made today.   Outpatient Medications Prior to Visit  Medication Sig Dispense Refill  . aspirin EC 81 MG tablet Take 81 mg by mouth daily.    . TRUEPLUS LANCETS 28G MISC 1 each by Does not apply route 4 (four) times daily - after meals and at bedtime. 100 each 12  . acetaminophen (TYLENOL) 500 MG tablet Take 1,000 mg by mouth every 6 (six) hours as needed for mild pain, moderate pain or headache. Reported on 06/18/2015    .  glipiZIDE (GLUCOTROL) 5 MG tablet TAKE 1 TABLET (5 MG TOTAL) BY MOUTH 2 (TWO) TIMES DAILY BEFORE A MEAL. 180 tablet 2  . Insulin Glargine (LANTUS SOLOSTAR) 100 UNIT/ML Solostar Pen Inject 20 Units into the skin daily at 10 pm. 15 mL 3  . Insulin Pen Needle (TRUEPLUS PEN NEEDLES) 32G X 4 MM MISC USE AS DIRECTED 100 each 12  . omega-3 acid ethyl esters (LOVAZA) 1 g capsule TAKE 2 CAPSULES BY MOUTH 2 TIMES DAILY. 120 capsule 2  . gabapentin (NEURONTIN) 300 MG capsule Take 1 capsule (300 mg total) by mouth at bedtime. (Patient not taking: Reported on 08/18/2017) 30 capsule 2  . glucose blood (TRUE METRIX BLOOD GLUCOSE TEST) test strip Use as instructed (Patient not taking: Reported on 08/18/2017) 100 each 12  . gemfibrozil (LOPID) 600 MG tablet Take 1 tablet (600 mg total) by mouth 2 (two) times daily before a meal. 60 tablet 2  . ketoconazole (NIZORAL) 2 % cream Apply 1 application topically daily. Apply to affected areas of feet for 14 days. (Patient not taking: Reported on 08/18/2017) 15 g 0  . metFORMIN (GLUCOPHAGE) 1000 MG tablet TAKE 1 TABLET BY MOUTH 2 TIMES DAILY WITH A MEAL. (Patient not  taking: Reported on 08/18/2017) 60 tablet 0  . sitaGLIPtin (JANUVIA) 50 MG tablet Take 1 tablet (50 mg total) by mouth daily. (Patient not taking: Reported on 08/18/2017) 30 tablet 2  . diphenhydrAMINE (BENADRYL) capsule 25 mg      No facility-administered medications prior to visit.     Allergies  Allergen Reactions  . Lipitor [Atorvastatin] Rash       Objective:    BP 123/86 (BP Location: Right Arm, Patient Position: Sitting, Cuff Size: Normal)   Pulse (!) 113   Temp 98.2 F (36.8 C) (Oral)   Ht 6' (1.829 m)   Wt 242 lb (109.8 kg)   SpO2 95%   BMI 32.82 kg/m  Wt Readings from Last 3 Encounters:  08/18/17 242 lb (109.8 kg)  04/07/17 258 lb 6.4 oz (117.2 kg)  12/01/16 258 lb 12.8 oz (117.4 kg)    Physical Exam  Constitutional: He is oriented to person, place, and time. He appears well-developed and well-nourished. He is cooperative.  HENT:  Head: Normocephalic and atraumatic.  Eyes: EOM are normal.  Neck: Normal range of motion.  Cardiovascular: Normal rate, regular rhythm and normal heart sounds. Exam reveals no gallop and no friction rub.  No murmur heard. Pulmonary/Chest: Effort normal and breath sounds normal. No tachypnea. No respiratory distress. He has no decreased breath sounds. He has no wheezes. He has no rhonchi. He has no rales. He exhibits no tenderness.  Abdominal: Bowel sounds are normal.  Musculoskeletal: Normal range of motion. He exhibits no edema.  Neurological: He is alert and oriented to person, place, and time. Coordination normal.  Skin: Skin is warm and dry.  Psychiatric: He has a normal mood and affect. His behavior is normal. Judgment and thought content normal.  Nursing note and vitals reviewed.      Patient has been counseled extensively about nutrition and exercise as well as the importance of adherence with medications and regular follow-up. The patient was given clear instructions to go to ER or return to medical center if symptoms don't  improve, worsen or new problems develop. The patient verbalized understanding.   Follow-up: Return in about 3 months (around 11/18/2017).   Gildardo Pounds, FNP-BC [facility] [place], [place]  08/18/2017, 5:49 PM

## 2017-08-18 NOTE — Patient Instructions (Addendum)

## 2017-08-19 LAB — PSA: Prostate Specific Ag, Serum: 39.1 ng/mL — ABNORMAL HIGH (ref 0.0–4.0)

## 2017-08-20 ENCOUNTER — Other Ambulatory Visit: Payer: Self-pay | Admitting: Nurse Practitioner

## 2017-08-20 DIAGNOSIS — R972 Elevated prostate specific antigen [PSA]: Secondary | ICD-10-CM

## 2017-08-22 ENCOUNTER — Telehealth: Payer: Self-pay

## 2017-08-22 NOTE — Telephone Encounter (Signed)
-----   Message from Gildardo Pounds, NP sent at 08/20/2017  1:31 PM EDT ----- Your PSA which can be an indicator of prostate cancer, enlarged prostate or prostate infection is almost 10 times the normal limit. This is very serious and you would need to be evaluated by Urology. Please make sure you obtain an appointment with the financial counselor ASAP in order to be referred to a specialist for further testing and evaluation.

## 2017-08-22 NOTE — Telephone Encounter (Signed)
CMA attempt to call patient to inform on lab results.  No answer and left a VM for patient to call back, if patient call back, please inform:  Your PSA which can be an indicator of prostate cancer, enlarged prostate or prostate infection is almost 10 times the normal limit. This is very serious and you would need to be evaluated by Urology. Please make sure you obtain an appointment with the financial counselor ASAP in order to be referred to a specialist for further testing and evaluation.

## 2017-08-25 MED FILL — $LANTUS SOLOSTAR 100 UNITS/: 100 | 30 days supply | Qty: 6 | Fill #4

## 2017-09-06 MED FILL — $LANTUS SOLOSTAR 100 UNITS/: 100 | 30 days supply | Qty: 12 | Fill #0

## 2017-09-20 MED FILL — !JANUVIA 50 MG TABLET: 50 | 30 days supply | Qty: 30 | Fill #1

## 2017-09-20 MED FILL — OMEGA-3 ETHYL ESTERS 1 GM C: 1 | 30 days supply | Qty: 120 | Fill #2

## 2017-09-20 MED FILL — GEMFIBROZIL 600 MG TAB: 600 | 30 days supply | Qty: 60 | Fill #1

## 2017-10-11 MED FILL — $LANTUS SOLOSTAR 100 UNITS/: 100 | 30 days supply | Qty: 12 | Fill #1

## 2017-10-19 MED FILL — !JANUVIA 50 MG TABLET: 50 | 30 days supply | Qty: 30 | Fill #2

## 2017-10-19 MED FILL — glipiZIDE 5 MG TABS: 5 | 30 days supply | Qty: 60 | Fill #1

## 2017-10-19 MED FILL — GEMFIBROZIL 600 MG TAB: 600 | 30 days supply | Qty: 60 | Fill #2

## 2017-10-19 MED FILL — OMEGA-3 ETHYL ESTERS 1 GM C: 1 | 30 days supply | Qty: 120 | Fill #1

## 2017-11-02 ENCOUNTER — Other Ambulatory Visit: Payer: Self-pay

## 2017-11-02 ENCOUNTER — Ambulatory Visit (HOSPITAL_COMMUNITY)
Admission: EM | Admit: 2017-11-02 | Discharge: 2017-11-02 | Disposition: A | Discharge status: Home / Self Care | Payer: Self-pay | Attending: Family Medicine | Admitting: Family Medicine

## 2017-11-02 ENCOUNTER — Encounter (HOSPITAL_COMMUNITY): Payer: Self-pay | Admitting: Emergency Medicine

## 2017-11-02 DIAGNOSIS — I2510 Atherosclerotic heart disease of native coronary artery without angina pectoris: Secondary | ICD-10-CM | POA: Insufficient documentation

## 2017-11-02 DIAGNOSIS — Z888 Allergy status to other drugs, medicaments and biological substances status: Secondary | ICD-10-CM | POA: Insufficient documentation

## 2017-11-02 DIAGNOSIS — Z79899 Other long term (current) drug therapy: Secondary | ICD-10-CM | POA: Insufficient documentation

## 2017-11-02 DIAGNOSIS — Z833 Family history of diabetes mellitus: Secondary | ICD-10-CM | POA: Insufficient documentation

## 2017-11-02 DIAGNOSIS — N390 Urinary tract infection, site not specified: Secondary | ICD-10-CM | POA: Insufficient documentation

## 2017-11-02 DIAGNOSIS — E669 Obesity, unspecified: Secondary | ICD-10-CM | POA: Insufficient documentation

## 2017-11-02 DIAGNOSIS — Z87891 Personal history of nicotine dependence: Secondary | ICD-10-CM | POA: Insufficient documentation

## 2017-11-02 DIAGNOSIS — Z8249 Family history of ischemic heart disease and other diseases of the circulatory system: Secondary | ICD-10-CM | POA: Insufficient documentation

## 2017-11-02 DIAGNOSIS — E785 Hyperlipidemia, unspecified: Secondary | ICD-10-CM | POA: Insufficient documentation

## 2017-11-02 DIAGNOSIS — N400 Enlarged prostate without lower urinary tract symptoms: Secondary | ICD-10-CM | POA: Insufficient documentation

## 2017-11-02 DIAGNOSIS — Z794 Long term (current) use of insulin: Secondary | ICD-10-CM | POA: Insufficient documentation

## 2017-11-02 DIAGNOSIS — I252 Old myocardial infarction: Secondary | ICD-10-CM | POA: Insufficient documentation

## 2017-11-02 DIAGNOSIS — Z955 Presence of coronary angioplasty implant and graft: Secondary | ICD-10-CM | POA: Insufficient documentation

## 2017-11-02 DIAGNOSIS — I10 Essential (primary) hypertension: Secondary | ICD-10-CM | POA: Insufficient documentation

## 2017-11-02 DIAGNOSIS — Z7982 Long term (current) use of aspirin: Secondary | ICD-10-CM | POA: Insufficient documentation

## 2017-11-02 DIAGNOSIS — E119 Type 2 diabetes mellitus without complications: Secondary | ICD-10-CM | POA: Insufficient documentation

## 2017-11-02 DIAGNOSIS — Z9889 Other specified postprocedural states: Secondary | ICD-10-CM | POA: Insufficient documentation

## 2017-11-02 LAB — POCT URINALYSIS DIP (DEVICE)
Bilirubin Urine: NEGATIVE
GLUCOSE, UA: NEGATIVE mg/dL
Nitrite: POSITIVE — AB
Protein, ur: 30 mg/dL — AB
SPECIFIC GRAVITY, URINE: 1.02 (ref 1.005–1.030)
UROBILINOGEN UA: 0.2 mg/dL (ref 0.0–1.0)
pH: 5.5 (ref 5.0–8.0)

## 2017-11-02 MED ORDER — CEPHALEXIN 500 MG PO CAPS: 500.0000 mg | ORAL_CAPSULE | Freq: Four times a day (QID) | ORAL | 0 refills | Status: AC

## 2017-11-02 NOTE — ED Triage Notes (Signed)
Symptoms started 2 days ago.  Patient has seen blood in urine, odor to urine.patient has also had a back ache

## 2017-11-02 NOTE — Discharge Instructions (Signed)
Drink plenty of water to empty bladder regularly. Avoid alcohol and caffeine as these may irritate the bladder.   Complete course of antibiotics.  I have sent your urine to be cultured and would notify you if any changes are indicated.  If develop any worsening of symptoms, increased pain, fevers, inability to urinate or otherwise worsening please return or go to Er.

## 2017-11-02 NOTE — ED Provider Notes (Signed)
[place]    CSN: [id number] Arrival date & time: 11/02/17  1757     History   Chief Complaint Chief Complaint  Patient presents with  . Urinary Tract Infection    HPI Shane Bond is a 57 y.o. male.   [name] presents with concerns for UTI. States for the past two days has had burning and frequency of urination. He self caths to void due to a previous back injury/surgery. Noted blood to urine yesterday and darker urine today. No fevers or chills. No abdominal pain. Denies any increased back pain. Has had similar in the past. Does not follow with urology. Hx of MI, BPD, CAD, DM, hyperlipidemia.     ROS per HPI.      Past Medical History:  Diagnosis Date  . 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 Bladder atonia   . BPH (benign prostatic hyperplasia)   . CAD, NATIVE VESSEL 10/29/2009  . Diabetes mellitus type 2, uncontrolled, with complications ([place])   . Hyperlipidemia LDL goal <70   . OBESITY 10/22/2009    Patient Active Problem List   Diagnosis Date Noted  . Type 2 diabetes mellitus with hyperglycemia, with long-term current use of insulin ([place]) 04/13/2017  . Diabetes mellitus without complication ([place]) 99/37/1696  . Former smoker 11/02/2015  . Diabetes type 2, uncontrolled ([place]) 06/18/2015  . Hyperlipidemia 06/18/2015  . BPH (benign prostatic hyperplasia) 06/18/2015  . Self-catheterizes urinary bladder 06/18/2015  . HTN (hypertension) 07/08/2010  . CAD, NATIVE VESSEL 10/29/2009  . 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 BACK SURGERY    . CORONARY STENT PLACEMENT    . CYSTOSCOPY W/ URETERAL STENT PLACEMENT  02/26/2012   Procedure: CYSTOSCOPY WITH RETROGRADE PYELOGRAM/URETERAL STENT PLACEMENT;  Surgeon: Bernestine Amass, MD;  Location: [facility];  Service: Urology;  Laterality: Left;       Home Medications    Prior to Admission medications   Medication Sig Start Date End Date Taking? Authorizing Provider    aspirin EC 81 MG tablet Take 81 mg by mouth daily.    [provider]  cephALEXin (KEFLEX) 500 MG capsule Take 1 capsule (500 mg total) by mouth 4 (four) times daily for 7 days. 11/02/17 11/09/17  Zigmund Gottron, NP  gemfibrozil (LOPID) 600 MG tablet Take 1 tablet (600 mg total) by mouth 2 (two) times daily before a meal. 08/18/17 11/16/17  Gildardo Pounds, NP  glipiZIDE (GLUCOTROL) 5 MG tablet Take 1 tablet (5 mg total) by mouth 2 (two) times daily before a meal. 08/18/17   Gildardo Pounds, NP  glucose blood (TRUE METRIX BLOOD GLUCOSE TEST) test strip Use as instructed Patient not taking: Reported on 08/18/2017 12/01/16   Alfonse Spruce, FNP  Insulin Glargine (LANTUS SOLOSTAR) 100 UNIT/ML Solostar Pen Inject 40 Units into the skin daily at 10 pm. 08/18/17   Gildardo Pounds, NP  Insulin Pen Needle (TRUEPLUS PEN NEEDLES) 32G X 4 MM MISC USE AS DIRECTED 08/18/17   Gildardo Pounds, NP  lisinopril (PRINIVIL,ZESTRIL) 5 MG tablet Take 1 tablet (5 mg total) by mouth daily. 08/18/17   Gildardo Pounds, NP  omega-3 acid ethyl esters (LOVAZA) 1 g capsule TAKE 2 CAPSULES BY MOUTH 2 TIMES DAILY. 08/18/17   Gildardo Pounds, NP  sitaGLIPtin-metformin (JANUMET) 50-1000 MG tablet Take 1 tablet by mouth 2 (two) times daily with a meal. 08/18/17   Gildardo Pounds, NP  TRUEPLUS LANCETS  28G MISC 1 each by Does not apply route 4 (four) times daily - after meals and at bedtime. 12/01/16   Alfonse Spruce, FNP    Family History Family History  Problem Relation Age of Onset  . Diabetes Father   . Coronary artery disease Other   . Heart attack Other   . Diabetes Paternal Aunt     Social History Social History   Tobacco Use  . Smoking status: Former Research scientist (life sciences)  . Smokeless tobacco: Former Network engineer Use Topics  . Alcohol use: No  . Drug use: No     Allergies   Lipitor [atorvastatin]   Review of Systems Review of Systems   Physical Exam Triage Vital Signs ED Triage Vitals  Enc  Vitals Group     BP 11/02/17 1828 112/78     Pulse Rate 11/02/17 1828 (!) 115     Resp 11/02/17 1828 20     Temp 11/02/17 1828 98.3 F (36.8 C)     Temp Source 11/02/17 1828 Oral     SpO2 11/02/17 1828 96 %     Weight --      Height --      Head Circumference --      Peak Flow --      Pain Score 11/02/17 1825 2     Pain Loc --      Pain Edu? --      Excl. in [place]? --    No data found.  Updated Vital Signs BP 112/78 (BP Location: Right Arm) Comment (BP Location): large cuff  Pulse (!) 115   Temp 98.3 F (36.8 C) (Oral)   Resp 20   SpO2 96%   Physical Exam  Constitutional: He is oriented to person, place, and time. He appears well-developed and well-nourished.  Cardiovascular: Normal rate and regular rhythm.  Pulmonary/Chest: Effort normal and breath sounds normal.  Abdominal: Soft. There is no tenderness. There is no rigidity, no rebound, no guarding, no CVA tenderness, no tenderness at McBurney's point and negative Murphy's sign.  Denies scrotal redness, swelling, pain; denies sores or lesions; gu exam deferred   Neurological: He is alert and oriented to person, place, and time.  Skin: Skin is warm and dry.     UC Treatments / Results  Labs (all labs ordered are listed, but only abnormal results are displayed) Labs Reviewed  POCT URINALYSIS DIP (DEVICE) - Abnormal; Notable for the following components:      Result Value   Ketones, ur TRACE (*)    Hgb urine dipstick LARGE (*)    Protein, ur 30 (*)    Nitrite POSITIVE (*)    Leukocytes, UA SMALL (*)    All other components within normal limits  URINE CULTURE    EKG None  Radiology No results found.  Procedures Procedures (including critical care time)  Medications Ordered in UC Medications - No data to display  Initial Impression / Assessment and Plan / UC Course  I have reviewed the triage vital signs and the nursing notes.  Pertinent labs & imaging results that were available during my care of the  patient were reviewed by me and considered in my medical decision making (see chart for details).     Urine and history consistent with UTI. Culture obtained and pending. Keflex initiated. Return precautions provided. If symptoms worsen or do not improve in the next week to return to be seen or to follow up with PCP.  Patient verbalized understanding and  agreeable to plan.    Final Clinical Impressions(s) / UC Diagnoses   Final diagnoses:  Lower urinary tract infectious disease     Discharge Instructions     Drink plenty of water to empty bladder regularly. Avoid alcohol and caffeine as these may irritate the bladder.   Complete course of antibiotics.  I have sent your urine to be cultured and would notify you if any changes are indicated.  If develop any worsening of symptoms, increased pain, fevers, inability to urinate or otherwise worsening please return or go to Er.    ED Prescriptions    Medication Sig Dispense Auth. Provider   cephALEXin (KEFLEX) 500 MG capsule Take 1 capsule (500 mg total) by mouth 4 (four) times daily for 7 days. 28 capsule Zigmund Gottron, NP     Controlled Substance Prescriptions [place] Controlled Substance Registry consulted? Not Applicable   Zigmund Gottron, NP 11/02/17 [phone]

## 2017-11-05 LAB — URINE CULTURE

## 2017-11-06 ENCOUNTER — Telehealth (HOSPITAL_COMMUNITY): Payer: Self-pay

## 2017-11-06 NOTE — Telephone Encounter (Signed)
Urine culture positive for Klebsiella Oxytoca. This was treated with keflex at ucc visit. Pt called and made aware.

## 2017-11-08 MED FILL — $LANTUS SOLOSTAR 100 UNITS/: 100 | 30 days supply | Qty: 12 | Fill #2

## 2017-11-22 MED FILL — OMEGA-3 ETHYL ESTERS 1 GM C: 1 | 30 days supply | Qty: 120 | Fill #2

## 2017-11-22 MED FILL — glipiZIDE 5 MG TABS: 5 | 30 days supply | Qty: 60 | Fill #2

## 2017-11-22 MED FILL — LISINOPRIL 5 MG TAB: 5 | 90 days supply | Qty: 90 | Fill #1

## 2017-11-22 MED FILL — GEMFIBROZIL 600 MG TAB: 600 | 30 days supply | Qty: 60 | Fill #0

## 2017-12-14 MED FILL — !LANTUS SOLOSTAR 100UNITS/M: 100 | 30 days supply | Qty: 12 | Fill #3

## 2017-12-21 MED FILL — glipiZIDE 5 MG TABS: 5 | 30 days supply | Qty: 60 | Fill #3

## 2017-12-21 MED FILL — GEMFIBROZIL 600 MG TAB: 600 | 30 days supply | Qty: 60 | Fill #1

## 2017-12-21 MED FILL — OMEGA-3 ETHYL ESTERS 1 GM C: 1 | 30 days supply | Qty: 120 | Fill #0

## 2018-01-11 MED FILL — $LANTUS SOLOSTAR 100 UNITS/: 100 | 30 days supply | Qty: 12 | Fill #4

## 2018-01-25 MED FILL — GEMFIBROZIL 600 MG TAB: 600 | 30 days supply | Qty: 60 | Fill #2

## 2018-01-25 MED FILL — OMEGA-3 ETHYL ESTERS 1 GM C: 1 | 30 days supply | Qty: 120 | Fill #1

## 2018-01-25 MED FILL — glipiZIDE 5 MG TABS: 5 | 30 days supply | Qty: 60 | Fill #4

## 2018-02-08 ENCOUNTER — Other Ambulatory Visit: Payer: Self-pay | Admitting: Nurse Practitioner

## 2018-02-08 DIAGNOSIS — E1165 Type 2 diabetes mellitus with hyperglycemia: Secondary | ICD-10-CM

## 2018-02-08 DIAGNOSIS — Z794 Long term (current) use of insulin: Principal | ICD-10-CM

## 2018-02-08 MED FILL — TRUEPLUS PEN NDL 32GX5/32": 32G X 4 MM | 30 days supply | Qty: 100 | Fill #0

## 2018-02-08 MED FILL — TRUEPLUS PEN NDL 32GX5/32: 32G X 4 MM | 30 days supply | Qty: 100 | Fill #0

## 2018-02-08 NOTE — Telephone Encounter (Signed)
Please call the patient back about the insulin.

## 2018-02-09 MED FILL — $LANTUS SOLOSTAR 100 UNITS/: 100 | 40 days supply | Qty: 12 | Fill #0

## 2018-02-22 ENCOUNTER — Other Ambulatory Visit: Payer: Self-pay | Admitting: Nurse Practitioner

## 2018-02-22 DIAGNOSIS — E785 Hyperlipidemia, unspecified: Secondary | ICD-10-CM

## 2018-02-22 DIAGNOSIS — Z794 Long term (current) use of insulin: Secondary | ICD-10-CM

## 2018-02-22 DIAGNOSIS — E1165 Type 2 diabetes mellitus with hyperglycemia: Secondary | ICD-10-CM

## 2018-02-22 MED FILL — glipiZIDE 5 MG TABS: 5 | 30 days supply | Qty: 60 | Fill #5

## 2018-02-22 MED FILL — LISINOPRIL 5 MG TAB: 5 | 90 days supply | Qty: 90 | Fill #2

## 2018-02-22 MED FILL — OMEGA-3 ETHYL ESTERS 1 GM C: 1 | 30 days supply | Qty: 120 | Fill #2

## 2018-02-26 MED FILL — GEMFIBROZIL 600 MG TAB: 600 | 30 days supply | Qty: 60 | Fill #0

## 2018-03-08 ENCOUNTER — Other Ambulatory Visit: Payer: Self-pay | Admitting: Nurse Practitioner

## 2018-03-08 DIAGNOSIS — E1165 Type 2 diabetes mellitus with hyperglycemia: Secondary | ICD-10-CM

## 2018-03-08 DIAGNOSIS — Z794 Long term (current) use of insulin: Principal | ICD-10-CM

## 2018-03-08 MED FILL — $LANTUS SOLOSTAR 100 UNITS/: 100 | 7 days supply | Qty: 3 | Fill #1

## 2018-03-08 NOTE — Telephone Encounter (Signed)
1) Medication(s) Requested (by name): Insulin lantus 2) Pharmacy of Choice:  [facility]  *patient only has a weeks worth of insulin

## 2018-03-12 MED ORDER — INSULIN GLARGINE 100 UNIT/ML SOLOSTAR PEN: 40.0000 [IU] | PEN_INJECTOR | Freq: Every day | SUBCUTANEOUS | 0 refills | Status: DC

## 2018-03-15 ENCOUNTER — Telehealth: Payer: Self-pay | Admitting: Nurse Practitioner

## 2018-03-15 DIAGNOSIS — E119 Type 2 diabetes mellitus without complications: Secondary | ICD-10-CM

## 2018-03-15 MED ORDER — GLUCOSE BLOOD VI STRP: ORAL_STRIP | 0 refills | Status: DC

## 2018-03-15 MED FILL — !LANTUS SOLOSTAR 100UNITS/M: 100 | 30 days supply | Qty: 12 | Fill #0

## 2018-03-15 NOTE — Telephone Encounter (Signed)
Pt is requesting a refill for test strips sent to [facility]

## 2018-03-16 ENCOUNTER — Other Ambulatory Visit: Payer: Self-pay | Admitting: Pharmacist

## 2018-03-16 DIAGNOSIS — E119 Type 2 diabetes mellitus without complications: Secondary | ICD-10-CM

## 2018-03-16 NOTE — Progress Notes (Signed)
Encounter opened in error

## 2018-03-28 ENCOUNTER — Other Ambulatory Visit: Payer: Self-pay | Admitting: Nurse Practitioner

## 2018-03-28 DIAGNOSIS — E785 Hyperlipidemia, unspecified: Secondary | ICD-10-CM

## 2018-03-28 DIAGNOSIS — Z794 Long term (current) use of insulin: Secondary | ICD-10-CM

## 2018-03-28 DIAGNOSIS — E1165 Type 2 diabetes mellitus with hyperglycemia: Secondary | ICD-10-CM

## 2018-03-28 MED FILL — glipiZIDE 5 MG TABS: 5 | 30 days supply | Qty: 60 | Fill #6

## 2018-03-29 ENCOUNTER — Other Ambulatory Visit: Payer: Self-pay | Admitting: Nurse Practitioner

## 2018-03-29 DIAGNOSIS — Z794 Long term (current) use of insulin: Secondary | ICD-10-CM

## 2018-03-29 DIAGNOSIS — E1165 Type 2 diabetes mellitus with hyperglycemia: Secondary | ICD-10-CM

## 2018-03-29 DIAGNOSIS — E785 Hyperlipidemia, unspecified: Secondary | ICD-10-CM

## 2018-03-29 MED FILL — GEMFIBROZIL 600 MG TAB: 600 | 30 days supply | Qty: 60 | Fill #0

## 2018-04-06 ENCOUNTER — Ambulatory Visit (HOSPITAL_BASED_OUTPATIENT_CLINIC_OR_DEPARTMENT_OTHER): Payer: Self-pay | Admitting: Licensed Clinical Social Worker

## 2018-04-06 ENCOUNTER — Ambulatory Visit: Payer: Self-pay | Attending: Nurse Practitioner | Admitting: Nurse Practitioner

## 2018-04-06 ENCOUNTER — Encounter: Payer: Self-pay | Admitting: Nurse Practitioner

## 2018-04-06 VITALS — BP 121/81 | HR 93 | Temp 98.7°F | Ht 72.0 in | Wt 254.6 lb

## 2018-04-06 DIAGNOSIS — Z955 Presence of coronary angioplasty implant and graft: Secondary | ICD-10-CM | POA: Insufficient documentation

## 2018-04-06 DIAGNOSIS — E669 Obesity, unspecified: Secondary | ICD-10-CM | POA: Insufficient documentation

## 2018-04-06 DIAGNOSIS — Z5989 Other problems related to housing and economic circumstances: Secondary | ICD-10-CM

## 2018-04-06 DIAGNOSIS — E785 Hyperlipidemia, unspecified: Secondary | ICD-10-CM | POA: Insufficient documentation

## 2018-04-06 DIAGNOSIS — Z7982 Long term (current) use of aspirin: Secondary | ICD-10-CM | POA: Insufficient documentation

## 2018-04-06 DIAGNOSIS — I2510 Atherosclerotic heart disease of native coronary artery without angina pectoris: Secondary | ICD-10-CM | POA: Insufficient documentation

## 2018-04-06 DIAGNOSIS — I10 Essential (primary) hypertension: Secondary | ICD-10-CM | POA: Insufficient documentation

## 2018-04-06 DIAGNOSIS — Z794 Long term (current) use of insulin: Secondary | ICD-10-CM | POA: Insufficient documentation

## 2018-04-06 DIAGNOSIS — Z833 Family history of diabetes mellitus: Secondary | ICD-10-CM | POA: Insufficient documentation

## 2018-04-06 DIAGNOSIS — Z8249 Family history of ischemic heart disease and other diseases of the circulatory system: Secondary | ICD-10-CM | POA: Insufficient documentation

## 2018-04-06 DIAGNOSIS — Z598 Other problems related to housing and economic circumstances: Secondary | ICD-10-CM

## 2018-04-06 DIAGNOSIS — Z888 Allergy status to other drugs, medicaments and biological substances status: Secondary | ICD-10-CM | POA: Insufficient documentation

## 2018-04-06 DIAGNOSIS — Z79899 Other long term (current) drug therapy: Secondary | ICD-10-CM | POA: Insufficient documentation

## 2018-04-06 DIAGNOSIS — E1165 Type 2 diabetes mellitus with hyperglycemia: Secondary | ICD-10-CM | POA: Insufficient documentation

## 2018-04-06 DIAGNOSIS — E119 Type 2 diabetes mellitus without complications: Secondary | ICD-10-CM

## 2018-04-06 DIAGNOSIS — R972 Elevated prostate specific antigen [PSA]: Secondary | ICD-10-CM | POA: Insufficient documentation

## 2018-04-06 DIAGNOSIS — I252 Old myocardial infarction: Secondary | ICD-10-CM | POA: Insufficient documentation

## 2018-04-06 DIAGNOSIS — Z6834 Body mass index (BMI) 34.0-34.9, adult: Secondary | ICD-10-CM | POA: Insufficient documentation

## 2018-04-06 LAB — POCT GLYCOSYLATED HEMOGLOBIN (HGB A1C): HEMOGLOBIN A1C: 9.9 % — AB (ref 4.0–5.6)

## 2018-04-06 LAB — GLUCOSE, POCT (MANUAL RESULT ENTRY): POC Glucose: 240 mg/dl — AB (ref 70–99)

## 2018-04-06 MED ORDER — OMEGA-3-ACID ETHYL ESTERS 1 G PO CAPS: ORAL_CAPSULE | ORAL | 2 refills | Status: DC

## 2018-04-06 MED ORDER — GLIPIZIDE 5 MG PO TABS: 5.0000 mg | ORAL_TABLET | Freq: Two times a day (BID) | ORAL | 2 refills | Status: DC

## 2018-04-06 MED ORDER — GLUCOSE BLOOD VI STRP: ORAL_STRIP | 0 refills | Status: DC

## 2018-04-06 MED ORDER — INSULIN GLARGINE 100 UNIT/ML SOLOSTAR PEN: 40.0000 [IU] | PEN_INJECTOR | Freq: Every day | SUBCUTANEOUS | 0 refills | Status: DC

## 2018-04-06 MED ORDER — LISINOPRIL 5 MG PO TABS: 5.0000 mg | ORAL_TABLET | Freq: Every day | ORAL | 3 refills | Status: DC

## 2018-04-06 MED ORDER — SITAGLIPTIN PHOS-METFORMIN HCL 50-1000 MG PO TABS: 1.0000 | ORAL_TABLET | Freq: Two times a day (BID) | ORAL | 3 refills | Status: DC

## 2018-04-06 MED ORDER — INSULIN PEN NEEDLE 32G X 4 MM MISC: 12 refills | Status: DC

## 2018-04-06 MED ORDER — TRUEPLUS LANCETS 28G MISC: 1.0000 | Freq: Three times a day (TID) | 12 refills | Status: DC

## 2018-04-06 MED ORDER — GEMFIBROZIL 600 MG PO TABS: 600.0000 mg | ORAL_TABLET | Freq: Two times a day (BID) | ORAL | 1 refills | Status: DC

## 2018-04-06 MED FILL — TRUE METRIX TEST STRIP: 25 days supply | Qty: 100 | Fill #0

## 2018-04-06 MED FILL — !JANUMET 50-1000MG TABLET: 50-1000 | 30 days supply | Qty: 60 | Fill #0

## 2018-04-06 MED FILL — OMEGA-3 ETHYL ESTERS 1 GM C: 1 | 30 days supply | Qty: 120 | Fill #0

## 2018-04-06 MED FILL — TRUEplus LANCETS 28G MISC: 25 days supply | Qty: 100 | Fill #0

## 2018-04-06 MED FILL — TRUEPLUS PEN NDL 32GX5/32: 32G X 4 MM | 25 days supply | Qty: 100 | Fill #0

## 2018-04-06 MED FILL — TRUEPLUS PEN NDL 32GX5/32": 32G X 4 MM | 25 days supply | Qty: 100 | Fill #0

## 2018-04-06 MED FILL — $LANTUS SOLOSTAR 100 UNITS/: 100 | 30 days supply | Qty: 12 | Fill #0

## 2018-04-06 NOTE — Progress Notes (Signed)
Assessment & Plan:  Shane Bond was seen today for follow-up.  Diagnoses and all orders for this visit:  Uncontrolled type 2 diabetes mellitus with hyperglycemia, with long-term current use of insulin (HCC) -     Glucose (CBG) -     HgB A1c -     sitaGLIPtin-metformin (JANUMET) 50-1000 MG tablet; Take 1 tablet by mouth 2 (two) times daily with a meal. -     Insulin Glargine (LANTUS SOLOSTAR) 100 UNIT/ML Solostar Pen; Inject 40 Units into the skin daily at 10 pm. -     glipiZIDE (GLUCOTROL) 5 MG tablet; Take 1 tablet (5 mg total) by mouth 2 (two) times daily before a meal. -     lisinopril (PRINIVIL,ZESTRIL) 5 MG tablet; Take 1 tablet (5 mg total) by mouth daily. -     omega-3 acid ethyl esters (LOVAZA) 1 g capsule; TAKE 2 CAPSULES BY MOUTH 2 TIMES DAILY. -     TRUEPLUS LANCETS 28G MISC; 1 each by Does not apply route 4 (four) times daily - after meals and at bedtime. -     Insulin Pen Needle (TRUEPLUS PEN NEEDLES) 32G X 4 MM MISC; USE AS DIRECTED -     glucose blood (TRUE METRIX BLOOD GLUCOSE TEST) test strip; Use as instructed -     Urinalysis, Complete -     CMP14+EGFR -     CBC Continue blood sugar control as discussed in office today, low carbohydrate diet, and regular physical exercise as tolerated, 150 minutes per week (30 min each day, 5 days per week, or 50 min 3 days per week). Keep blood sugar logs with fasting goal of 90-130 mg/dl, post prandial (after you eat) less than 180.  For Hypoglycemia: BS <60 and Hyperglycemia BS >400; contact the clinic ASAP. Annual eye exams and foot exams are recommended.   Hyperlipidemia, unspecified hyperlipidemia type -     gemfibrozil (LOPID) 600 MG tablet; Take 1 tablet (600 mg total) by mouth 2 (two) times daily before a meal. -     omega-3 acid ethyl esters (LOVAZA) 1 g capsule; TAKE 2 CAPSULES BY MOUTH 2 TIMES DAILY. -     Lipid panel INSTRUCTIONS: Work on a low fat, heart healthy diet and participate in regular aerobic exercise program by  working out at least 150 minutes per week; 5 days a week-30 minutes per day. Avoid red meat, fried foods. junk foods, sodas, sugary drinks, unhealthy snacking, alcohol and smoking.  Drink at least 48oz of water per day and monitor your carbohydrate intake daily.    Elevated PSA, greater than or equal to 20 ng/ml -     PSA -     Ambulatory referral to Urology    Patient has been counseled on age-appropriate routine health concerns for screening and prevention. These are reviewed and up-to-date. Referrals have been placed accordingly. Immunizations are up-to-date or declined.    Subjective:   Chief Complaint  Patient presents with  . Follow-up    Pt. is here for follow-up on diabetes.   HPI Shane Bond 58 y.o. male presents to office today for follow up to DM.   Elevated PSA PSA was noted to be elevated June 2019 and he was instructed to apply for the financial assistance here in this office and was referred to urology at that time.  Unfortunately he has returned today after almost 8 months and still has not seen urology nor has he applied for the financial assistance program here.  He  has a history of BPH and also self caths himself.  He sustained a back injury in 1999 when he went to move a mortar bag and immediately felt "something in his back" pull and he has not been able to urinate on his own since then.  He does however feel discomfort and pressure when his bladder is full.  Today he also endorses increased episodes of self cathing due to urinary frequency.   DIABETES MELLITUS TYPE II Chronic and poorly controlled.  A1c down from 11.2-9.9 today.  He endorses medication compliance currently taking glipizide 5 mg twice daily, Lantus 40 units daily.  Unfortunately he is still taking metformin although I have prescribed Janumet 50-1000 mg twice daily 8 months ago.  He states he was not aware that his medications have been changed.  I have instructed him to start Janumet 50-1000 mg  today and will need follow-up in a few weeks with meter check.  Currently denies any hypo-or hyperglycemic symptoms and is overdue for eye exam. Patient has been advised to apply for financial assistance and schedule to see our financial counselor.  Lab Results  Component Value Date   HGBA1C 9.9 (A) 04/06/2018    Essential Hypertension Chronic and blood pressure well controlled. Lisinopril 5 mg daily.  He denies chest pain, shortness of breath, palpitations, lightheadedness, dizziness, headaches or BLE edema.  BP Readings from Last 3 Encounters:  04/06/18 121/81  11/02/17 112/78  08/18/17 123/86    Hyperlipidemia Patient presents for follow up to hyperlipidemia.  He is medication compliant taking gemfibrozil 600 mg BID and lovaza 1gm BID . He is not diet compliant and denies statin intolerance including myalgias. LDL not controlled.  Lab Results  Component Value Date   CHOL 195 04/07/2017   Lab Results  Component Value Date   HDL 36 (L) 04/07/2017   Lab Results  Component Value Date   LDLCALC 123 (H) 04/07/2017   Lab Results  Component Value Date   TRIG 179 (H) 04/07/2017   Lab Results  Component Value Date   CHOLHDL 5.4 (H) 04/07/2017   Lab Results  Component Value Date   LDLDIRECT 87.7 11/09/2009    Review of Systems  Constitutional: Negative for fever, malaise/fatigue and weight loss.  HENT: Negative.  Negative for nosebleeds.   Eyes: Negative.  Negative for blurred vision, double vision and photophobia.  Respiratory: Negative.  Negative for cough and shortness of breath.   Cardiovascular: Negative.  Negative for chest pain, palpitations and leg swelling.  Gastrointestinal: Negative.  Negative for heartburn, nausea and vomiting.  Genitourinary:       Elevated PSA Bladder Atonia  Musculoskeletal: Negative.  Negative for myalgias.  Neurological: Negative.  Negative for dizziness, focal weakness, seizures and headaches.  Psychiatric/Behavioral: Negative.  Negative  for suicidal ideas.    Past Medical History:  Diagnosis Date  . 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 Bladder atonia   . BPH (benign prostatic hyperplasia)   . CAD, NATIVE VESSEL 10/29/2009  . Diabetes mellitus type 2, uncontrolled, with complications ([employer])   . Hyperlipidemia LDL goal <70   . OBESITY 10/22/2009    Past Surgical History:  Procedure Laterality Date  . BACK SURGERY    . CORONARY STENT PLACEMENT    . CYSTOSCOPY W/ URETERAL STENT PLACEMENT  02/26/2012   Procedure: CYSTOSCOPY WITH RETROGRADE PYELOGRAM/URETERAL STENT PLACEMENT;  Surgeon: Bernestine Amass, MD;  Location: [facility];  Service: Urology;  Laterality: Left;    Family History  Problem Relation Age  of Onset  . Diabetes Father   . Coronary artery disease Other   . Heart attack Other   . Diabetes Paternal Aunt     Social History Reviewed with no changes to be made today.   Outpatient Medications Prior to Visit  Medication Sig Dispense Refill  . aspirin EC 81 MG tablet Take 81 mg by mouth daily.    [name] gemfibrozil (LOPID) 600 MG tablet TAKE 1 TABLET (600 MG TOTAL) BY MOUTH 2 (TWO) TIMES DAILY BEFORE A MEAL. MUST MAKE APPT FOR FURTHER REFILLS 60 tablet 0  . glipiZIDE (GLUCOTROL) 5 MG tablet Take 1 tablet (5 mg total) by mouth 2 (two) times daily before a meal. 180 tablet 2  . glucose blood (TRUE METRIX BLOOD GLUCOSE TEST) test strip Use as instructed 100 each 0  . Insulin Glargine (LANTUS SOLOSTAR) 100 UNIT/ML Solostar Pen Inject 40 Units into the skin daily at 10 pm. 15 mL 0  . Insulin Pen Needle (TRUEPLUS PEN NEEDLES) 32G X 4 MM MISC USE AS DIRECTED 100 each 12  . lisinopril (PRINIVIL,ZESTRIL) 5 MG tablet Take 1 tablet (5 mg total) by mouth daily. 90 tablet 3  . omega-3 acid ethyl esters (LOVAZA) 1 g capsule TAKE 2 CAPSULES BY MOUTH 2 TIMES DAILY. 120 capsule 2  . TRUEPLUS LANCETS 28G MISC 1 each by Does not apply route 4 (four) times daily - after meals and at bedtime. 100 each 12  . sitaGLIPtin-metformin (JANUMET) 50-1000 MG  tablet Take 1 tablet by mouth 2 (two) times daily with a meal. (Patient not taking: Reported on 04/06/2018) 60 tablet 3   No facility-administered medications prior to visit.     Allergies  Allergen Reactions  . Lipitor [Atorvastatin] Rash       Objective:    BP 121/81 (BP Location: Left Arm, Patient Position: Sitting, Cuff Size: Normal)   Pulse 93   Temp 98.7 F (37.1 C) (Oral)   Ht 6' (1.829 m)   Wt 254 lb 9.6 oz (115.5 kg)   SpO2 94%   BMI 34.53 kg/m  Wt Readings from Last 3 Encounters:  04/06/18 254 lb 9.6 oz (115.5 kg)  08/18/17 242 lb (109.8 kg)  04/07/17 258 lb 6.4 oz (117.2 kg)    Physical Exam Vitals signs and nursing note reviewed.  Constitutional:      Appearance: He is well-developed.  HENT:     Head: Normocephalic and atraumatic.  Neck:     Musculoskeletal: Normal range of motion.  Cardiovascular:     Rate and Rhythm: Normal rate and regular rhythm.     Heart sounds: Normal heart sounds. No murmur. No friction rub. No gallop.   Pulmonary:     Effort: Pulmonary effort is normal. No tachypnea or respiratory distress.     Breath sounds: Normal breath sounds. No decreased breath sounds, wheezing, rhonchi or rales.  Chest:     Chest wall: No tenderness.  Abdominal:     General: Bowel sounds are normal.     Palpations: Abdomen is soft.     Hernia: A hernia is present. Hernia is present in the umbilical area (peri).    Musculoskeletal: Normal range of motion.  Skin:    General: Skin is warm and dry.  Neurological:     Mental Status: He is alert and oriented to person, place, and time.     Coordination: Coordination normal.  Psychiatric:        Behavior: Behavior normal. Behavior is cooperative.  Thought Content: Thought content normal.        Judgment: Judgment normal.        Patient has been counseled extensively about nutrition and exercise as well as the importance of adherence with medications and regular follow-up. The patient was given  clear instructions to go to ER or return to medical center if symptoms don't improve, worsen or new problems develop. The patient verbalized understanding.   Follow-up: Return for 6  weeks with luke for meter check and 3 months with me.   Gildardo Pounds, FNP-BC [facility] [place]   04/06/2018, 1:22 PM

## 2018-04-06 NOTE — BH Specialist Note (Signed)
LCSWA met with patient during visit with PCP. LCSWA introduced self and explained role at [facility]. Family had questions regarding how to apply for medicaid. Supportive resources were provided. No additional concerns noted.

## 2018-04-07 ENCOUNTER — Other Ambulatory Visit: Payer: Self-pay | Admitting: Nurse Practitioner

## 2018-04-07 DIAGNOSIS — R972 Elevated prostate specific antigen [PSA]: Secondary | ICD-10-CM

## 2018-04-07 LAB — CMP14+EGFR
A/G RATIO: 1.4 (ref 1.2–2.2)
ALT: 35 IU/L (ref 0–44)
AST: 26 IU/L (ref 0–40)
Albumin: 4.6 g/dL (ref 3.8–4.9)
Alkaline Phosphatase: 101 IU/L (ref 39–117)
BUN/Creatinine Ratio: 17 (ref 9–20)
BUN: 14 mg/dL (ref 6–24)
Bilirubin Total: 0.4 mg/dL (ref 0.0–1.2)
CALCIUM: 9.7 mg/dL (ref 8.7–10.2)
CHLORIDE: 98 mmol/L (ref 96–106)
CO2: 21 mmol/L (ref 20–29)
CREATININE: 0.84 mg/dL (ref 0.76–1.27)
GFR calc Af Amer: 112 mL/min/{1.73_m2} (ref >59)
GFR calc non Af Amer: 97 mL/min/{1.73_m2} (ref >59)
GLOBULIN, TOTAL: 3.3 g/dL (ref 1.5–4.5)
Glucose: 252 mg/dL — ABNORMAL HIGH (ref 65–99)
Potassium: 4.6 mmol/L (ref 3.5–5.2)
Sodium: 132 mmol/L — ABNORMAL LOW (ref 134–144)
TOTAL PROTEIN: 7.9 g/dL (ref 6.0–8.5)

## 2018-04-07 LAB — CBC
HEMATOCRIT: 43.6 % (ref 37.5–51.0)
Hemoglobin: 15.7 g/dL (ref 13.0–17.7)
MCH: 33.3 pg — ABNORMAL HIGH (ref 26.6–33.0)
MCHC: 36 g/dL — ABNORMAL HIGH (ref 31.5–35.7)
MCV: 93 fL (ref 79–97)
PLATELETS: 252 10*3/uL (ref 150–450)
RBC: 4.71 x10E6/uL (ref 4.14–5.80)
RDW: 12 % (ref 11.6–15.4)
WBC: 7.4 10*3/uL (ref 3.4–10.8)

## 2018-04-07 LAB — LIPID PANEL
Chol/HDL Ratio: 5.7 ratio — ABNORMAL HIGH (ref 0.0–5.0)
Cholesterol, Total: 188 mg/dL (ref 100–199)
HDL: 33 mg/dL — ABNORMAL LOW (ref >39)
LDL Calculated: 134 mg/dL — ABNORMAL HIGH (ref 0–99)
TRIGLYCERIDES: 104 mg/dL (ref 0–149)
VLDL CHOLESTEROL CAL: 21 mg/dL (ref 5–40)

## 2018-04-07 LAB — PSA: Prostate Specific Ag, Serum: 46.4 ng/mL — ABNORMAL HIGH (ref 0.0–4.0)

## 2018-04-07 MED ORDER — CIPROFLOXACIN HCL 500 MG PO TABS: 500.0000 mg | ORAL_TABLET | Freq: Two times a day (BID) | ORAL | 0 refills | Status: DC

## 2018-04-09 ENCOUNTER — Telehealth: Payer: Self-pay

## 2018-04-09 MED FILL — CIPROFLOXACIN HCL 500 MG TA: 500 | 10 days supply | Qty: 20 | Fill #0

## 2018-04-09 NOTE — Telephone Encounter (Signed)
CMA attempt to reach patient to inform on lab results.  No answer and left a VM for a call back.  

## 2018-04-09 NOTE — Telephone Encounter (Signed)
-----   Message from Gildardo Pounds, NP sent at 04/07/2018 11:51 PM EST ----- PSA has increased more. Will send antibiotic to treat any possible infection. I am also referring you to Urology. If you don't have insurance you can also call [facility] and try to set up a payment plan until you have been approved for insurance through the clinic or medicaid.

## 2018-04-11 NOTE — Telephone Encounter (Signed)
CMA spoke to patient's sister and inform on results.  Patient's sister is aware and will inform patient.

## 2018-05-03 MED FILL — glipiZIDE 5 MG TABS: 5 | 30 days supply | Qty: 60 | Fill #0

## 2018-05-03 MED FILL — GEMFIBROZIL 600 MG TAB: 600 | 30 days supply | Qty: 60 | Fill #0

## 2018-05-03 MED FILL — OMEGA-3 ETHYL ESTERS 1 GM C: 1 | 30 days supply | Qty: 120 | Fill #1

## 2018-05-16 MED FILL — LISINOPRIL 5 MG TAB: 5 | 30 days supply | Qty: 30 | Fill #0

## 2018-05-17 ENCOUNTER — Other Ambulatory Visit: Payer: Self-pay | Admitting: Pharmacist

## 2018-05-17 DIAGNOSIS — E1165 Type 2 diabetes mellitus with hyperglycemia: Secondary | ICD-10-CM

## 2018-05-17 DIAGNOSIS — Z794 Long term (current) use of insulin: Principal | ICD-10-CM

## 2018-05-17 MED ORDER — INSULIN GLARGINE 100 UNIT/ML SOLOSTAR PEN: 40.0000 [IU] | PEN_INJECTOR | Freq: Every day | SUBCUTANEOUS | 2 refills | Status: DC

## 2018-05-18 ENCOUNTER — Other Ambulatory Visit: Payer: Self-pay | Admitting: Pharmacist

## 2018-05-18 DIAGNOSIS — Z794 Long term (current) use of insulin: Principal | ICD-10-CM

## 2018-05-18 DIAGNOSIS — E1165 Type 2 diabetes mellitus with hyperglycemia: Secondary | ICD-10-CM

## 2018-05-18 MED ORDER — INSULIN GLARGINE 100 UNIT/ML SOLOSTAR PEN: 40.0000 [IU] | PEN_INJECTOR | Freq: Every day | SUBCUTANEOUS | 2 refills | Status: DC

## 2018-05-18 MED FILL — $LANTUS SOLOSTAR 100 UNITS/: 100 | 30 days supply | Qty: 12 | Fill #0

## 2018-05-21 ENCOUNTER — Ambulatory Visit: Payer: Self-pay | Attending: Nurse Practitioner | Admitting: Pharmacist

## 2018-05-21 ENCOUNTER — Encounter: Payer: Self-pay | Admitting: Pharmacist

## 2018-05-21 ENCOUNTER — Other Ambulatory Visit: Payer: Self-pay

## 2018-05-21 DIAGNOSIS — E1165 Type 2 diabetes mellitus with hyperglycemia: Secondary | ICD-10-CM

## 2018-05-21 DIAGNOSIS — Z794 Long term (current) use of insulin: Principal | ICD-10-CM

## 2018-05-21 MED ORDER — LIRAGLUTIDE 18 MG/3ML ~~LOC~~ SOPN: PEN_INJECTOR | SUBCUTANEOUS | 2 refills | Status: DC

## 2018-05-21 MED ORDER — METFORMIN HCL 1000 MG PO TABS: 1000.0000 mg | ORAL_TABLET | Freq: Two times a day (BID) | ORAL | 0 refills | Status: DC

## 2018-05-21 NOTE — Patient Instructions (Signed)
Mr. Wojdyla,   Please read these instructions carefully.   Thank you for spending time with me today.  1. Continue your current diabetes regimen of glipizide, Janumet, and Lantus until your new medications arrive.  2. Your new medications will be metformin and Victoza.  3. Once you receive the metformin and Victoza, start these and please stop the glipizide and Janumet.  4. For the metformin, you will take 1 pill in the morning and 1 pill in the evening.  5. For the Victoza, you will inject it every day in the morning. Start with the lowest dose (0.6 mg) daily for one week. Then increase to the second dose (1.2 mg) daily for one week. Then increase to the third dose (1.8mg ) daily. Once you reach the third dose (1.8 mg), you'll continue this every day from now on. Even if you start a new pen, continue with the highest dose.  6. Continue to check sugars at home every day. 7. Continue your good work with the diet and try to increase exercise as you are able.  8. If you need me, call me at [phone].   Lurena Joiner

## 2018-05-21 NOTE — Progress Notes (Signed)
S:    PCP: [name]  Patientin good spirits.Service for DM managementwas provided via telemedicine. Patient is currently in his home; I am corresponding from my office in clinic. After name and DOB verification, pt is consensual to proceed with telephone visit.  Patient was last seen and referred by Zelda on 04/06/18.   Family/Social History:  - FHx: DM (father, paternal aunt) - Tobacco: current every day smoker (E-cigarettes) - Alcohol: denies   Human resources officer affordability: none  Patient reports adherence with medications.  Current diabetes medications include: glipizide 5mg  BID, Janumet 50-1000 mg BID, Lantus 40 units at night Current hypertension medications include: lisinopril 5 mg daily  Patient denies hypoglycemic events.  Patient reported dietary habits: - Limits carbohydrates  - Denies consuming sweets  - Denies drinking sodas, sweet tea  Patient-reported exercise habits:  -  Not getting much    Patient denies polydipsia, polyuria. Patient denies neuropathy. Patient denies visual changes. Patient reports self foot exams.   O:  Pt took glucose at home during telephone encounter: 225 (cereal ~1 hour ago) Home fasting CBG: 80, 125, 130, 155  Lab Results  Component Value Date   HGBA1C 9.9 (A) 04/06/2018   There were no vitals filed for this visit.  Lipid Panel     Component Value Date/Time   CHOL 188 04/06/2018 1137   TRIG 104 04/06/2018 1137   HDL 33 (L) 04/06/2018 1137   CHOLHDL 5.7 (H) 04/06/2018 1137   CHOLHDL 5.1 (H) 06/18/2015 1116   VLDL 32 (H) 06/18/2015 1116   LDLCALC 134 (H) 04/06/2018 1137   LDLDIRECT 87.7 11/09/2009 1026   Clinical ASCVD: Yes - MI  The ASCVD Risk score Mikey Bussing DC Jr., et al., 2013) failed to calculate for the following reasons:   The patient has a prior MI or stroke diagnosis   A/P: Diabetes longstanding currently uncontrolled based on A1c. Patient is able to verbalize appropriate hypoglycemia  management plan. Patient is adherent with medication.   He would benefit from Victoza given ASCVD hx. Pt was educated extensively on risk/benefit, indication, dosing, and potential side effects. Denies personal or family hx of thyroid cancer. Denies personal hx of pancreatitis. Pt is amenable to try.   Will stop Janumet and initiate single-agent metformin given that it is not recommended to be on a DPP-4 inhibitor and GLP-RA concurrently. Will stop glipizide for now, as Victoza will provide post-prandial coverage and I want to decrease his hypoglycemia risk.   I have advised him to continue his current regimen until the new medications are received via mail-order. Instructions have been sent to him.  -Continued Lantus 40 units daily.  -Start Victoza 0.6 mg daily x1 week, then 1.2 mg daily x1 week, then 1.8 mg daily thereafter.  -Start metformin 1000 mg BID. -Discontinued Janumet, glipizide -Extensively discussed pathophysiology of DM, recommended lifestyle interventions, dietary effects on glycemic control -Counseled on s/sx of and management of hypoglycemia -Next A1C anticipated 06/2018.   ASCVD risk - secondary prevention in patient with DM. Last LDL is not controlled. High intensity statin indicated. Aspirin is indicated. Patient with rash to atorvastatin; has failed pravastatin. Recommend to try Crestor 20 mg daily and monitor. Will forward to PCP for review.  -Continued aspirin 81 mg  -Recommend Crestor 20 mg daily  HM: UTD on vaccines.  Written patient instructions provided.  Total time in face to face counseling 10 minutes.   Follow up Pharmacist Clinic Visit in 2 weeks.    Acquanetta Belling Ausdall,  PharmD, [place] [phone]

## 2018-05-22 ENCOUNTER — Telehealth: Payer: Self-pay

## 2018-05-22 ENCOUNTER — Telehealth: Payer: Self-pay | Admitting: Nurse Practitioner

## 2018-05-22 NOTE — Telephone Encounter (Signed)
New Message   Pt calling in, states his sugar is 76 and they are wanting to know if he needs to take his insulin

## 2018-05-22 NOTE — Telephone Encounter (Signed)
Patients call returned.  Patient identified by name and date of birth. Patient states his blood sugar was 76 today at 1330 hrs.  Patient states he had a can of soup around 0700-0800 and no lunch.  Patient was advised to put two (2) tablespoons of sugar in his tea and Nursing would call back in an hour and retake his sugar.  Patient acknowledged understanding of instructions.

## 2018-05-22 NOTE — Telephone Encounter (Addendum)
Call placed to the patient to in regards to his straight catheters.  He said that he has been using catheters since 1999 and has been getting the supplies at [facility]. Paying privately, no insurance. He explained that they are now requiring a prescription. He was not sure of the catheter type or size and did not have a package at home to refer to.  He is also requesting lubricant.   Call placed to  [facility], spoke to Cammie who stated that they just require a prescription. They don't file insurance, so they don't request demographic information and there are no forms to complete. She just needs the catheter size on the prescription. Since he doesn't know the catheter size,  12 Fr or 14 Fr.can be ordered. The prescription can be faxed to [facility] fax # [phone] or the patient can bring the prescription to the store.    Call placed to patient and informed him of the  outcome of call with [facility].  He then said that he is not sure that is the correct store and he could not remember the name of the store.  He said it was on [address]. Informed him that he would be called when the prescription is ready  And he can pick it up at [facility],    Message sent to [name], NP with this information.

## 2018-05-22 NOTE — Telephone Encounter (Signed)
Patients call returned.  Patient verified by name and date of birth.  Patient asked to take his blood sugar after he had drank some sweet tea.  Blood sugar was 143.  Patient advised to eat at least three meals a day follow the dietary guide lines previously given.  Patient acknowledged understanding of instructions          .

## 2018-05-24 ENCOUNTER — Ambulatory Visit: Payer: Self-pay | Admitting: Pharmacist

## 2018-05-25 MED FILL — OMEGA-3 ETHYL ESTERS 1 GM C: 1 | 30 days supply | Qty: 120 | Fill #2

## 2018-05-25 MED FILL — GEMFIBROZIL 600 MG TAB: 600 | 30 days supply | Qty: 60 | Fill #1

## 2018-05-29 ENCOUNTER — Telehealth: Payer: Self-pay | Admitting: Nurse Practitioner

## 2018-05-29 ENCOUNTER — Other Ambulatory Visit: Payer: Self-pay | Admitting: Nurse Practitioner

## 2018-05-29 MED ORDER — MISC. DEVICES MISC: 99 refills | Status: AC

## 2018-05-29 MED ORDER — MISC. DEVICES MISC: 99 refills | Status: DC

## 2018-05-29 NOTE — Telephone Encounter (Signed)
Call returned to patient's sister, she stated that she called [facility], formerly [facility], and was told that there are no records of what the patient purchased.  He did not need a prescription at the time. She said that when [facility] took over, they did not received any information about the patient. Please advise.

## 2018-05-29 NOTE — Telephone Encounter (Signed)
Please let patient know he can come and pick up the prescription. Please also let him I am more concerned that he may have prostate cancer and has he followed up with UROLOGY as requested back in February?  He was also instructed to contact [facility] regarding setting up a payment plan to be evaluated as soon as possible. Has he followed up with any of this?

## 2018-05-29 NOTE — Telephone Encounter (Signed)
New Message  Pt's sister is calling, states a prescription was suppose to be sent over for a catheter because he needs it to urinate. Please f/u

## 2018-05-29 NOTE — Telephone Encounter (Signed)
Patient asked to speak with you again because she is confused. Please follow up

## 2018-05-30 NOTE — Telephone Encounter (Signed)
NOTED. THANK YOU.

## 2018-05-30 NOTE — Telephone Encounter (Signed)
CMA spoke to patient's sister and informed to pick up the prescription for catheter and have the medical supply fax over item to continue getting supply.  Patient's sister stated they have not reach out Urology and was informed they need to and can call them regarding about payments.

## 2018-05-31 MED FILL — VICTOZA 18 MG/3 ML INJECT P: 18 | 30 days supply | Qty: 9 | Fill #0

## 2018-06-04 ENCOUNTER — Other Ambulatory Visit: Payer: Self-pay

## 2018-06-04 ENCOUNTER — Ambulatory Visit: Payer: Self-pay | Attending: Family Medicine | Admitting: Pharmacist

## 2018-06-04 DIAGNOSIS — E1165 Type 2 diabetes mellitus with hyperglycemia: Secondary | ICD-10-CM

## 2018-06-04 DIAGNOSIS — Z794 Long term (current) use of insulin: Principal | ICD-10-CM

## 2018-06-04 MED FILL — metFORMIN HCL 1000 MG TABS: 1000 | 90 days supply | Qty: 180 | Fill #0

## 2018-06-04 NOTE — Patient Instructions (Signed)
Thank you for coming to see me today. Please do the following:  1. Continue Lantus 40 units at bedtime with Victoza 0.6 mg every morning.  2. Start metformin 1000mg  two times daily once you receive it from the pharmacy.   3. Continue checking blood sugars at home. 4. Continue making the lifestyle changes we've discussed together during our visit. Diet and exercise play a significant role in improving your blood sugars.  5. Follow-up with me in 1 week.   Hypoglycemia or low blood sugar:   Low blood sugar can happen quickly and may become an emergency if not treated right away.   While this shouldn't happen often, it can be brought upon if you skip a meal or do not eat enough. Also, if your insulin or other diabetes medications are dosed too high, this can cause your blood sugar to go to low.   Warning signs of low blood sugar include: 1. Feeling shaky or dizzy 2. Feeling weak or tired  3. Excessive hunger 4. Feeling anxious or upset  5. Sweating even when you aren't exercising  What to do if I experience low blood sugar? 1. Check your blood sugar with your meter. If lower than 70, proceed to step 2.  2. Treat with 3-4 glucose tablets or 3 packets of regular sugar. If these aren't around, you can try hard candy. Yet another option would be to drink 4 ounces of fruit juice or 6 ounces of REGULAR soda.  3. Re-check your sugar in 15 minutes. If it is still below 70, do what you did in step 2 again. If has come back up, go ahead and eat a snack or small meal at this time.

## 2018-06-04 NOTE — Progress Notes (Signed)
    S:    PCP: [name]  Patientin good spirits.Service for DM managementwas provided via telemedicine. Patient is currently in his home; I am corresponding from my office in clinic. After name and DOB verification, pt is consensual to proceed with telephone visit.  Patient was last seen and referred by Zelda on 04/06/18.   Family/Social History:  - FHx: DM (father, paternal aunt) - Tobacco: current every day smoker (E-cigarettes) - Alcohol: denies   Human resources officer affordability: none  Patient denies adherence with medications.  Current diabetes medications include: metformin 1000 mg BID (not taking), Lantus 40 units at night, Victoza 0.6 mg daily (received and started 06/03/18) Current hypertension medications include: lisinopril 5 mg daily  Patient denies hypoglycemic events.  Patient reported dietary habits: - Limits carbohydrates  - Denies consuming sweets  - Denies drinking sodas, sweet tea   Patient-reported exercise habits:  -  Not getting much    Patient denies polydipsia, polyuria. Patient denies neuropathy. Patient denies visual changes. Patient reports self foot exams.   O:  This AM: 153  Home CBGs: 90s - 153 (253)  Lab Results  Component Value Date   HGBA1C 9.9 (A) 04/06/2018   There were no vitals filed for this visit.  Lipid Panel     Component Value Date/Time   CHOL 188 04/06/2018 1137   TRIG 104 04/06/2018 1137   HDL 33 (L) 04/06/2018 1137   CHOLHDL 5.7 (H) 04/06/2018 1137   CHOLHDL 5.1 (H) 06/18/2015 1116   VLDL 32 (H) 06/18/2015 1116   LDLCALC 134 (H) 04/06/2018 1137   LDLDIRECT 87.7 11/09/2009 1026   Clinical ASCVD: Yes - MI  The ASCVD Risk score Mikey Bussing DC Jr., et al., 2013) failed to calculate for the following reasons:   The patient has a prior MI or stroke diagnosis   A/P: Diabetes longstanding currently uncontrolled based on A1c. Patient is able to verbalize appropriate hypoglycemia management plan. Patient is  not adherent with medication; has not received metformin in the mail.   I have advised him to continue his current regimen. He is to begin metformin 1000 mg BID once received via mail-order. Instructions have been sent to him.  -Continued Lantus 40 units daily.  -Continue Victoza 0.6 mg daily.  -Start metformin 1000 mg BID. -Extensively discussed pathophysiology of DM, recommended lifestyle interventions, dietary effects on glycemic control -Counseled on s/sx of and management of hypoglycemia -Next A1C anticipated 06/2018.   ASCVD risk - secondary prevention in patient with DM. Last LDL is not controlled. High intensity statin indicated. Aspirin is indicated. Patient with rash to atorvastatin; has failed pravastatin. Recommend to try Crestor 20 mg daily and monitor. Will forward to PCP for review.  -Continued aspirin 81 mg  -Recommend Crestor 20 mg daily  HM: UTD on vaccines.  Written patient instructions provided.  Total time in face to face counseling 10 minutes.   Follow up Pharmacist Clinic Visit in 1 week for Victoza titration.     Benard Halsted, PharmD, [place] [phone]

## 2018-06-21 MED FILL — GEMFIBROZIL 600 MG TAB: 600 | 30 days supply | Qty: 60 | Fill #2

## 2018-06-21 MED FILL — LISINOPRIL 5 MG TAB: 5 | 30 days supply | Qty: 30 | Fill #1

## 2018-06-21 MED FILL — $LANTUS SOLOSTAR 100 UNITS/: 100 | 82 days supply | Qty: 33 | Fill #1

## 2018-07-04 MED FILL — TRUE METRIX TEST STRIP: 100 days supply | Qty: 100 | Fill #0

## 2018-07-04 MED FILL — TRUEPLUS PEN NDL 32GX5/32": 32G X 4 MM | 25 days supply | Qty: 100 | Fill #1

## 2018-07-04 MED FILL — VICTOZA 18 MG/3 ML INJECT P: 18 | 30 days supply | Qty: 9 | Fill #1

## 2018-07-04 MED FILL — TRUEPLUS PEN NDL 32GX5/32: 32G X 4 MM | 25 days supply | Qty: 100 | Fill #1

## 2018-07-06 ENCOUNTER — Ambulatory Visit: Payer: Self-pay | Admitting: Nurse Practitioner

## 2018-07-10 ENCOUNTER — Encounter: Payer: Self-pay | Admitting: Nurse Practitioner

## 2018-07-10 ENCOUNTER — Ambulatory Visit: Payer: Self-pay | Attending: Nurse Practitioner | Admitting: Nurse Practitioner

## 2018-07-10 ENCOUNTER — Other Ambulatory Visit: Payer: Self-pay | Admitting: Nurse Practitioner

## 2018-07-10 ENCOUNTER — Other Ambulatory Visit: Payer: Self-pay

## 2018-07-10 VITALS — BP 113/64 | HR 104 | Temp 98.0°F | Ht 72.0 in | Wt 243.2 lb

## 2018-07-10 DIAGNOSIS — Z794 Long term (current) use of insulin: Secondary | ICD-10-CM

## 2018-07-10 DIAGNOSIS — R972 Elevated prostate specific antigen [PSA]: Secondary | ICD-10-CM

## 2018-07-10 DIAGNOSIS — Z1211 Encounter for screening for malignant neoplasm of colon: Secondary | ICD-10-CM

## 2018-07-10 DIAGNOSIS — E1165 Type 2 diabetes mellitus with hyperglycemia: Secondary | ICD-10-CM

## 2018-07-10 DIAGNOSIS — E785 Hyperlipidemia, unspecified: Secondary | ICD-10-CM

## 2018-07-10 LAB — POCT GLYCOSYLATED HEMOGLOBIN (HGB A1C): Hemoglobin A1C: 6.5 % — AB (ref 4.0–5.6)

## 2018-07-10 LAB — GLUCOSE, POCT (MANUAL RESULT ENTRY): POC Glucose: 213 mg/dl — AB (ref 70–99)

## 2018-07-10 MED ORDER — OMEGA-3-ACID ETHYL ESTERS 1 G PO CAPS: ORAL_CAPSULE | ORAL | 2 refills | Status: DC

## 2018-07-10 MED FILL — OMEGA-3 ETHYL ESTERS 1 GM C: 1 | 30 days supply | Qty: 120 | Fill #0

## 2018-07-10 NOTE — Progress Notes (Signed)
Assessment & Plan:  Cedar was seen today for follow-up.  Diagnoses and all orders for this visit:  Uncontrolled type 2 diabetes mellitus with hyperglycemia, with long-term current use of insulin (HCC) -     Glucose (CBG) -     HgB A1c -     omega-3 acid ethyl esters (LOVAZA) 1 g capsule; TAKE 2 CAPSULES BY MOUTH 2 TIMES DAILY. -     CMP14+EGFR -     Ambulatory referral to Ophthalmology  Hyperlipidemia, unspecified hyperlipidemia type -     omega-3 acid ethyl esters (LOVAZA) 1 g capsule; TAKE 2 CAPSULES BY MOUTH 2 TIMES DAILY.  Elevated PSA, greater than or equal to 20 ng/ml -     PSA -     Urine cytology ancillary only -     Urinalysis, Routine w reflex microscopic  Colon cancer screening -     Fecal occult blood, imunochemical([facility])    Patient has been counseled on age-appropriate routine health concerns for screening and prevention. These are reviewed and up-to-date. Referrals have been placed accordingly. Immunizations are up-to-date or declined.    Subjective:   Chief Complaint  Patient presents with  . Follow-up    Pt. is here for diabetes follow up.    HPI DAWAN FARNEY 58 y.o. male presents to office today for diabetes, hypertension, hyperlipidemia. Past Medical History:  Diagnosis Date  . 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 Bladder atonia   . BPH (benign prostatic hyperplasia)   . CAD, NATIVE VESSEL 10/29/2009  . Diabetes mellitus type 2, uncontrolled, with complications ([place])   . Hyperlipidemia LDL goal <70   . OBESITY 10/22/2009   Patient has had previously elevated PSA greater than 40.  He does have a history of significant urological issues including BPH, bladder atonia (self cathing), and cystoscopy with ureteral stent placement.  He was urged a few months ago to contact [facility] for follow-up.  He has not done this as of today.  We had a lengthy discussion regarding his elevated PSA with a possible correlation to prostate CA, UTI, or GU  infection.  He verbalized understanding.    Essential Hypertension Taking lisinopril 5 mg daily as prescribed. He does not monitor his blood pressure at home.  He is not exercising and is adherent to low salt diet.  He does not have a blood pressure log  today.  Blood pressure is well controlled today.  Denies chest pain, shortness of breath, palpitations, lightheadedness, dizziness, headaches or BLE edema.  Cardiovascular risk factors: advanced age (older than 47 for men, 70 for women), diabetes mellitus, dyslipidemia, hypertension, male gender, obesity (BMI >= 30 kg/m2), sedentary lifestyle and smoking/ tobacco exposure. Use of agents associated with hypertension: none.  BP Readings from Last 3 Encounters:  07/10/18 113/64  04/06/18 121/81  11/02/17 112/78    Hyperlipidemia Patient presents for follow up to hyperlipidemia.  He is medication compliant taking Lovaza 2 g twice daily and gemfibrozil 600 mg twice daily.  He is not diet compliant and deniesstatin intolerance including myalgias. LDL not at goal. Lab Results  Component Value Date   CHOL 188 04/06/2018   Lab Results  Component Value Date   HDL 33 (L) 04/06/2018   Lab Results  Component Value Date   LDLCALC 134 (H) 04/06/2018   Lab Results  Component Value Date   TRIG 104 04/06/2018   Lab Results  Component Value Date   CHOLHDL 5.7 (H) 04/06/2018      Diabetes  Mellitus Type II Current symptoms/problems include none  Known diabetic complications: cardiovascular disease Current diabetic medications include: Lantus 40 units nightly, metformin 1000 mg twice daily, Victoza 1.8 mg daily Eye exam current (within one year): no Weight trend: stable Prior visit with dietician: no Current monitoring regimen: office lab tests - 4 times yearly Home blood sugar records: "good" Any episodes of hypoglycemia? no Is He on ACE inhibitor or angiotensin II receptor blocker? Yes  Lab Results  Component Value Date   HGBA1C 6.5  (A) 07/10/2018   HGBA1C 9.9 (A) 04/06/2018   HGBA1C 11.2 (A) 08/18/2017        Review of Systems  Constitutional: Negative for fever, malaise/fatigue and weight loss.  HENT: Negative.  Negative for nosebleeds.   Eyes: Negative.  Negative for blurred vision, double vision and photophobia.  Respiratory: Negative.  Negative for cough and shortness of breath.   Cardiovascular: Negative.  Negative for chest pain, palpitations and leg swelling.  Gastrointestinal: Negative.  Negative for heartburn, nausea and vomiting.  Genitourinary: Positive for frequency. Negative for dysuria, flank pain, hematuria and urgency.       History of BPH and bladder atonia: Patient self caths  Musculoskeletal: Positive for back pain. Negative for myalgias.  Neurological: Negative.  Negative for dizziness, focal weakness, seizures and headaches.  Psychiatric/Behavioral: Negative.  Negative for suicidal ideas.      Past Surgical History:  Procedure Laterality Date  . BACK SURGERY    . CORONARY STENT PLACEMENT    . CYSTOSCOPY W/ URETERAL STENT PLACEMENT  02/26/2012   Procedure: CYSTOSCOPY WITH RETROGRADE PYELOGRAM/URETERAL STENT PLACEMENT;  Surgeon: Bernestine Amass, MD;  Location: [facility];  Service: Urology;  Laterality: Left;    Family History  Problem Relation Age of Onset  . Diabetes Father   . Coronary artery disease Other   . Heart attack Other   . Diabetes Paternal Aunt     Social History Reviewed with no changes to be made today.   Outpatient Medications Prior to Visit  Medication Sig Dispense Refill  . aspirin EC 81 MG tablet Take 81 mg by mouth daily.    [name] glucose blood (TRUE METRIX BLOOD GLUCOSE TEST) test strip Use as instructed 100 each 0  . Insulin Glargine (LANTUS SOLOSTAR) 100 UNIT/ML Solostar Pen Inject 40 Units into the skin daily at 10 pm. 15 mL 2  . Insulin Pen Needle (TRUEPLUS PEN NEEDLES) 32G X 4 MM MISC USE AS DIRECTED 100 each 12  . liraglutide (VICTOZA) 18 MG/3ML SOPN Inject  0.6 mg daily x1 week, then 1.2 mg daily x1 week, then 1.8 mg daily thereafter. 9 mL 2  . lisinopril (PRINIVIL,ZESTRIL) 5 MG tablet Take 1 tablet (5 mg total) by mouth daily. 90 tablet 3  . metFORMIN (GLUCOPHAGE) 1000 MG tablet Take 1 tablet (1,000 mg total) by mouth 2 (two) times daily with a meal. 180 tablet 0  . Misc. Devices MISC Please provide patient with standard sized 9F urinary catheters for self cathing along with lubricant. 50 each prn  . TRUEPLUS LANCETS 28G MISC 1 each by Does not apply route 4 (four) times daily - after meals and at bedtime. 100 each 12  . ciprofloxacin (CIPRO) 500 MG tablet Take 1 tablet (500 mg total) by mouth 2 (two) times daily. 20 tablet 0  . omega-3 acid ethyl esters (LOVAZA) 1 g capsule TAKE 2 CAPSULES BY MOUTH 2 TIMES DAILY. 120 capsule 2  . gemfibrozil (LOPID) 600 MG tablet Take 1 tablet (600  mg total) by mouth 2 (two) times daily before a meal. 180 tablet 1   No facility-administered medications prior to visit.     Allergies  Allergen Reactions  . Lipitor [Atorvastatin] Rash       Objective:    BP 113/64 (BP Location: Left Arm, Patient Position: Sitting, Cuff Size: Large)   Pulse (!) 104   Temp 98 F (36.7 C) (Oral)   Ht 6' (1.829 m)   Wt 243 lb 3.2 oz (110.3 kg)   SpO2 96%   BMI 32.98 kg/m  Wt Readings from Last 3 Encounters:  07/10/18 243 lb 3.2 oz (110.3 kg)  04/06/18 254 lb 9.6 oz (115.5 kg)  08/18/17 242 lb (109.8 kg)    Physical Exam Vitals signs and nursing note reviewed.  Constitutional:      Appearance: He is well-developed.  HENT:     Head: Normocephalic and atraumatic.  Neck:     Musculoskeletal: Normal range of motion.  Cardiovascular:     Rate and Rhythm: Regular rhythm. Tachycardia present.     Heart sounds: Normal heart sounds. No murmur. No friction rub. No gallop.   Pulmonary:     Effort: Pulmonary effort is normal. No tachypnea or respiratory distress.     Breath sounds: Normal breath sounds. No decreased  breath sounds, wheezing, rhonchi or rales.  Chest:     Chest wall: No tenderness.  Abdominal:     General: Bowel sounds are normal.     Palpations: Abdomen is soft.  Musculoskeletal: Normal range of motion.  Skin:    General: Skin is warm and dry.  Neurological:     Mental Status: He is alert and oriented to person, place, and time.     Coordination: Coordination normal.  Psychiatric:        Behavior: Behavior normal. Behavior is cooperative.        Thought Content: Thought content normal.        Judgment: Judgment normal.        Patient has been counseled extensively about nutrition and exercise as well as the importance of adherence with medications and regular follow-up. The patient was given clear instructions to go to ER or return to medical center if symptoms don't improve, worsen or new problems develop. The patient verbalized understanding.   Follow-up: Return in about 3 months (around 10/10/2018).   Gildardo Pounds, FNP-BC [facility] and [facility] [place], [place]   07/10/2018, 10:47 AM

## 2018-07-11 ENCOUNTER — Other Ambulatory Visit: Payer: Self-pay | Admitting: Nurse Practitioner

## 2018-07-11 DIAGNOSIS — R8290 Unspecified abnormal findings in urine: Secondary | ICD-10-CM

## 2018-07-11 LAB — MICROSCOPIC EXAMINATION
Bacteria, UA: NONE SEEN
Casts: NONE SEEN /lpf
Epithelial Cells (non renal): NONE SEEN /hpf (ref 0–10)

## 2018-07-11 LAB — URINE CYTOLOGY ANCILLARY ONLY
Chlamydia: NEGATIVE
Neisseria Gonorrhea: NEGATIVE
Trichomonas: NEGATIVE

## 2018-07-11 LAB — URINALYSIS, ROUTINE W REFLEX MICROSCOPIC
Bilirubin, UA: NEGATIVE
Ketones, UA: NEGATIVE
Nitrite, UA: NEGATIVE
Protein,UA: NEGATIVE
RBC, UA: NEGATIVE
Specific Gravity, UA: 1.014 (ref 1.005–1.030)
Urobilinogen, Ur: 0.2 mg/dL (ref 0.2–1.0)
pH, UA: 5.5 (ref 5.0–7.5)

## 2018-07-11 LAB — CMP14+EGFR
ALT: 34 IU/L (ref 0–44)
AST: 26 IU/L (ref 0–40)
Albumin/Globulin Ratio: 1.6 (ref 1.2–2.2)
Albumin: 4.6 g/dL (ref 3.8–4.9)
Alkaline Phosphatase: 85 IU/L (ref 39–117)
BUN/Creatinine Ratio: 14 (ref 9–20)
BUN: 13 mg/dL (ref 6–24)
Bilirubin Total: 0.4 mg/dL (ref 0.0–1.2)
CO2: 17 mmol/L — ABNORMAL LOW (ref 20–29)
Calcium: 10.3 mg/dL — ABNORMAL HIGH (ref 8.7–10.2)
Chloride: 102 mmol/L (ref 96–106)
Creatinine, Ser: 0.93 mg/dL (ref 0.76–1.27)
GFR calc Af Amer: 105 mL/min/{1.73_m2} (ref >59)
GFR calc non Af Amer: 91 mL/min/{1.73_m2} (ref >59)
Globulin, Total: 2.9 g/dL (ref 1.5–4.5)
Glucose: 174 mg/dL — ABNORMAL HIGH (ref 65–99)
Potassium: 4.8 mmol/L (ref 3.5–5.2)
Sodium: 134 mmol/L (ref 134–144)
Total Protein: 7.5 g/dL (ref 6.0–8.5)

## 2018-07-11 LAB — PSA: Prostate Specific Ag, Serum: 53.2 ng/mL — ABNORMAL HIGH (ref 0.0–4.0)

## 2018-07-12 ENCOUNTER — Telehealth: Payer: Self-pay

## 2018-07-12 NOTE — Telephone Encounter (Signed)
-----   Message from Gildardo Pounds, NP sent at 07/11/2018  7:39 PM EDT ----- Urine does not show UTI however I am ordering a urine culture for further testing. PSA has increased. Will need to call [facility] at Address: [address], [place], [place] [address] Phone: [phone] to schedule an appointment as soon as possible. Please discuss a payment plan with them. It is very important that you be seen as your increased prostate levels could be an indicator of prostate cancer. Shandrell Boda please let the lab tech know we will need a urine culture.

## 2018-07-12 NOTE — Telephone Encounter (Signed)
CMA spoke to patient to inform on results.  Pt. Verified DOB.  Pt. Understood.  Pt. Stated he does not have any money to go to the urologist and will try to call them to see if they can financially help him.

## 2018-07-13 LAB — URINE CYTOLOGY ANCILLARY ONLY
Bacterial vaginitis: NEGATIVE
Candida vaginitis: NEGATIVE

## 2018-07-13 LAB — SPECIMEN STATUS REPORT

## 2018-07-13 LAB — CULTURE, URINE COMPREHENSIVE

## 2018-07-19 MED FILL — LISINOPRIL 5 MG TAB: 5 | 30 days supply | Qty: 30 | Fill #2

## 2018-07-19 MED FILL — GEMFIBROZIL 600 MG TAB: 600 | 30 days supply | Qty: 60 | Fill #3

## 2018-08-06 MED FILL — OMEGA-3 ETHYL ESTERS 1 GM C: 1 | 30 days supply | Qty: 120 | Fill #1

## 2018-08-06 MED FILL — !VICTOZA 18MG/3ML INJECT: 18 | 30 days supply | Qty: 9 | Fill #2

## 2018-08-29 ENCOUNTER — Other Ambulatory Visit: Payer: Self-pay | Admitting: Family Medicine

## 2018-08-29 MED FILL — VICTOZA 18 MG/3 ML INJECT P: 18 | 30 days supply | Qty: 9 | Fill #0

## 2018-08-29 MED FILL — LISINOPRIL 5 MG TAB: 5 | 30 days supply | Qty: 30 | Fill #3

## 2018-08-29 MED FILL — metFORMIN HCL 1000 MG TABS: 1000 | 30 days supply | Qty: 60 | Fill #0

## 2018-08-29 MED FILL — GEMFIBROZIL 600 MG TAB: 600 | 30 days supply | Qty: 60 | Fill #4

## 2018-09-17 ENCOUNTER — Other Ambulatory Visit: Payer: Self-pay | Admitting: Family Medicine

## 2018-09-17 DIAGNOSIS — Z794 Long term (current) use of insulin: Secondary | ICD-10-CM

## 2018-09-17 DIAGNOSIS — E1165 Type 2 diabetes mellitus with hyperglycemia: Secondary | ICD-10-CM

## 2018-09-17 MED FILL — $LANTUS SOLOSTAR 100 UNITS/: 100 | 37 days supply | Qty: 15 | Fill #0

## 2018-09-18 MED FILL — $VICTOZA 2-PAK 18MG/3ML PEN: 18 | 30 days supply | Qty: 9 | Fill #1

## 2018-10-01 MED FILL — LISINOPRIL 5 MG TAB: 5 | 30 days supply | Qty: 30 | Fill #4

## 2018-10-01 MED FILL — metFORMIN HCL 1000 MG TABS: 1000 | 30 days supply | Qty: 60 | Fill #1

## 2018-10-01 MED FILL — OMEGA-3 ETHYL ESTERS 1 GM C: 1 | 30 days supply | Qty: 120 | Fill #2

## 2018-10-01 MED FILL — GEMFIBROZIL 600 MG TAB: 600 | 30 days supply | Qty: 60 | Fill #5

## 2018-10-08 ENCOUNTER — Ambulatory Visit: Payer: Self-pay | Admitting: Nurse Practitioner

## 2018-10-17 MED FILL — $VICTOZA 2-PAK 18MG/3ML PEN: 18 | 30 days supply | Qty: 9 | Fill #2

## 2018-10-17 MED FILL — TRUEPLUS PEN NDL 32GX5/32: 32G X 4 MM | 25 days supply | Qty: 100 | Fill #2

## 2018-10-17 MED FILL — $LANTUS SOLOSTAR 100 UNITS/: 100 | 37 days supply | Qty: 15 | Fill #1

## 2018-10-17 MED FILL — TRUEPLUS PEN NDL 32GX5/32": 32G X 4 MM | 25 days supply | Qty: 100 | Fill #2

## 2018-11-02 ENCOUNTER — Other Ambulatory Visit: Payer: Self-pay | Admitting: Nurse Practitioner

## 2018-11-02 DIAGNOSIS — E785 Hyperlipidemia, unspecified: Secondary | ICD-10-CM

## 2018-11-02 DIAGNOSIS — E1165 Type 2 diabetes mellitus with hyperglycemia: Secondary | ICD-10-CM

## 2018-11-02 MED FILL — OMEGA-3 ETHYL ESTERS 1 GM C: 1 | 30 days supply | Qty: 120 | Fill #0

## 2018-11-02 MED FILL — GEMFIBROZIL 600 MG TAB: 600 | 30 days supply | Qty: 60 | Fill #0

## 2018-11-02 MED FILL — metFORMIN HCL 1000 MG TABS: 1000 | 30 days supply | Qty: 60 | Fill #2

## 2018-11-02 MED FILL — LISINOPRIL 5 MG TABLET: 5 | 30 days supply | Qty: 30 | Fill #5

## 2018-11-05 ENCOUNTER — Ambulatory Visit: Payer: Self-pay | Attending: Nurse Practitioner | Admitting: Nurse Practitioner

## 2018-11-05 ENCOUNTER — Encounter: Payer: Self-pay | Admitting: Nurse Practitioner

## 2018-11-05 ENCOUNTER — Other Ambulatory Visit: Payer: Self-pay

## 2018-11-05 VITALS — BP 118/84 | HR 104 | Temp 98.1°F | Ht 72.0 in | Wt 250.0 lb

## 2018-11-05 DIAGNOSIS — R972 Elevated prostate specific antigen [PSA]: Secondary | ICD-10-CM

## 2018-11-05 DIAGNOSIS — R3911 Hesitancy of micturition: Secondary | ICD-10-CM

## 2018-11-05 DIAGNOSIS — N401 Enlarged prostate with lower urinary tract symptoms: Secondary | ICD-10-CM

## 2018-11-05 DIAGNOSIS — E782 Mixed hyperlipidemia: Secondary | ICD-10-CM

## 2018-11-05 DIAGNOSIS — Z794 Long term (current) use of insulin: Secondary | ICD-10-CM

## 2018-11-05 DIAGNOSIS — E1165 Type 2 diabetes mellitus with hyperglycemia: Secondary | ICD-10-CM

## 2018-11-05 LAB — POCT GLYCOSYLATED HEMOGLOBIN (HGB A1C): Hemoglobin A1C: 7.6 % — AB (ref 4.0–5.6)

## 2018-11-05 LAB — GLUCOSE, POCT (MANUAL RESULT ENTRY): POC Glucose: 154 mg/dl — AB (ref 70–99)

## 2018-11-05 MED ORDER — TRUEPLUS PEN NEEDLES 32G X 4 MM MISC: 12 refills | Status: DC

## 2018-11-05 MED ORDER — OMEGA-3-ACID ETHYL ESTERS 1 G PO CAPS: 1.0000 g | ORAL_CAPSULE | Freq: Two times a day (BID) | ORAL | 1 refills | Status: DC

## 2018-11-05 MED ORDER — TRUEPLUS LANCETS 28G MISC: 1.0000 | Freq: Three times a day (TID) | 12 refills | Status: DC

## 2018-11-05 MED ORDER — GEMFIBROZIL 600 MG PO TABS: 600.0000 mg | ORAL_TABLET | Freq: Two times a day (BID) | ORAL | 1 refills | Status: DC

## 2018-11-05 MED ORDER — LISINOPRIL 5 MG PO TABS: 5.0000 mg | ORAL_TABLET | Freq: Every day | ORAL | 3 refills | Status: DC

## 2018-11-05 MED ORDER — TRUE METRIX BLOOD GLUCOSE TEST VI STRP: ORAL_STRIP | 12 refills | Status: DC

## 2018-11-05 MED ORDER — LANTUS SOLOSTAR 100 UNIT/ML ~~LOC~~ SOPN: PEN_INJECTOR | SUBCUTANEOUS | 2 refills | Status: DC

## 2018-11-05 MED ORDER — VICTOZA 18 MG/3ML ~~LOC~~ SOPN: PEN_INJECTOR | SUBCUTANEOUS | 2 refills | Status: DC

## 2018-11-05 MED ORDER — METFORMIN HCL 1000 MG PO TABS: 1000.0000 mg | ORAL_TABLET | Freq: Two times a day (BID) | ORAL | 1 refills | Status: DC

## 2018-11-05 MED FILL — TRUE METRIX TEST STRIP: 30 days supply | Qty: 100 | Fill #0

## 2018-11-05 MED FILL — TRUEplus LANCETS 28G MISC: 25 days supply | Qty: 100 | Fill #0

## 2018-11-05 NOTE — Progress Notes (Signed)
Assessment & Plan:  Shane Bond was seen today for follow-up.  Diagnoses and all orders for this visit:  Uncontrolled type 2 diabetes mellitus with hyperglycemia, with long-term current use of insulin (HCC) -     Glucose (CBG) -     HgB A1c -     Basic metabolic panel -     metFORMIN (GLUCOPHAGE) 1000 MG tablet; Take 1 tablet (1,000 mg total) by mouth 2 (two) times daily with a meal. -     omega-3 acid ethyl esters (LOVAZA) 1 g capsule; Take 1 capsule (1 g total) by mouth 2 (two) times daily. -     lisinopril (ZESTRIL) 5 MG tablet; Take 1 tablet (5 mg total) by mouth daily. -     liraglutide (VICTOZA) 18 MG/3ML SOPN; INJECT 0.6 MG DAILY X1 WEEK, THEN 1.2 MG DAILY X1 WEEK, THEN 1.8 MG DAILY THEREAFTER. -     Insulin Glargine (LANTUS SOLOSTAR) 100 UNIT/ML Solostar Pen; INJECT 50 UNITS INTO THE SKIN DAILY AT 10 PM. -     Insulin Pen Needle (TRUEPLUS PEN NEEDLES) 32G X 4 MM MISC; USE AS DIRECTED -     glucose blood (TRUE METRIX BLOOD GLUCOSE TEST) test strip; Use as instructed -     TRUEplus Lancets 28G MISC; 1 each by Does not apply route 4 (four) times daily - after meals and at bedtime. Continue blood sugar control as discussed in office today, low carbohydrate diet, and regular physical exercise as tolerated, 150 minutes per week (30 min each day, 5 days per week, or 50 min 3 days per week). Keep blood sugar logs with fasting goal of 90-130 mg/dl, post prandial (after you eat) less than 180.  For Hypoglycemia: BS <60 and Hyperglycemia BS >400; contact the clinic ASAP. Annual eye exams and foot exams are recommended.   Mixed hyperlipidemia -     Lipid panel -     omega-3 acid ethyl esters (LOVAZA) 1 g capsule; Take 1 capsule (1 g total) by mouth 2 (two) times daily. -     gemfibrozil (LOPID) 600 MG tablet; Take 1 tablet (600 mg total) by mouth 2 (two) times daily before a meal. INSTRUCTIONS: Work on a low fat, heart healthy diet and participate in regular aerobic exercise program by working  out at least 150 minutes per week; 5 days a week-30 minutes per day. Avoid red meat, fried foods. junk foods, sodas, sugary drinks, unhealthy snacking, alcohol and smoking.  Drink at least 48oz of water per day and monitor your carbohydrate intake daily.   Elevated PSA -     PSA UROLOGY REFERRAL   Benign prostatic hyperplasia with urinary hesitancy -     PSA -     CULTURE, URINE COMPREHENSIVE -     Urinalysis, Complete -     Microscopic Examination    Patient has been counseled on age-appropriate routine health concerns for screening and prevention. These are reviewed and up-to-date. Referrals have been placed accordingly. Immunizations are up-to-date or declined.    Subjective:   Chief Complaint  Patient presents with  . Follow-up    Pt. is here to follow up on diabetes.    HPI Shane Bond 58 y.o. male presents to office today for follow up to DM.   DM TYPE 2 Poorly controlled. He has not been taking his medications as prescribed. A1c up from 6.5 to 7.6. I have increased his lantus to 50 units from 40 units. He is to continue victoza 1.8  mg and metformin 1000 mg BID. Taking renal dose ACE and STATIN. His weight is up 7 lbs since his last visit. He has not been diet or exercise compliant.  Lab Results  Component Value Date   HGBA1C 7.6 (A) 11/05/2018   BP Readings from Last 3 Encounters:  11/05/18 118/84  07/10/18 113/64  04/06/18 121/81    Dyslipidemia LDL not at goal. He ran out of his cholesterol medications. Will refill lopid and lovaza. He is intolerant to statins. If levels remain elevated may switch to zetia. 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 PSA has been markedly elevated since last year. Despite repeatedly instructing patient to apply for the financial assistance and referring him to Urology; he has not followed up for this. I treated him in the past for  ?prostatitis with flouroquinolone however even after treatment his prostate levels have continued to increase. He currently endorses increased urinary hesitancy, frequency and strong odor.  LAST PSA 53    Review of Systems  Constitutional: Negative for fever, malaise/fatigue and weight loss.  HENT: Negative.  Negative for nosebleeds.   Eyes: Negative.  Negative for blurred vision, double vision and photophobia.  Respiratory: Negative.  Negative for cough and shortness of breath.   Cardiovascular: Negative.  Negative for chest pain, palpitations and leg swelling.  Gastrointestinal: Negative.  Negative for heartburn, nausea and vomiting.  Genitourinary: Positive for frequency. Negative for dysuria, flank pain, hematuria and urgency.       SEE HPI  Musculoskeletal: Negative.  Negative for myalgias.  Neurological: Negative.  Negative for dizziness, focal weakness, seizures and headaches.  Psychiatric/Behavioral: Negative.  Negative for suicidal ideas.    Past Medical History:  Diagnosis Date  . 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 Bladder atonia   . BPH (benign prostatic hyperplasia)   . CAD, NATIVE VESSEL 10/29/2009  . Diabetes mellitus type 2, uncontrolled, with complications ([place])   . Hyperlipidemia LDL goal <70   . OBESITY 10/22/2009    Past Surgical History:  Procedure Laterality Date  . BACK SURGERY    . CORONARY STENT PLACEMENT    . CYSTOSCOPY W/ URETERAL STENT PLACEMENT  02/26/2012   Procedure: CYSTOSCOPY WITH RETROGRADE PYELOGRAM/URETERAL STENT PLACEMENT;  Surgeon: Bernestine Amass, MD;  Location: [facility];  Service: Urology;  Laterality: Left;    Family History  Problem Relation Age of Onset  . Diabetes Father   . Coronary artery disease Other   . Heart attack Other   . Diabetes Paternal Aunt     Social History Reviewed with no changes to be made today.   Outpatient Medications Prior to Visit  Medication Sig Dispense Refill  . aspirin EC 81 MG tablet Take 81 mg by mouth daily.     . Misc. Devices MISC Please provide patient with standard sized 29F urinary catheters for self cathing along with lubricant. 50 each prn  . gemfibrozil (LOPID) 600 MG tablet TAKE 1 TABLET (600 MG TOTAL) BY MOUTH 2 (TWO) TIMES DAILY BEFORE A MEAL. 60 tablet 0  . glucose blood (TRUE METRIX BLOOD GLUCOSE TEST) test strip Use as instructed 100 each 0  . Insulin Pen Needle (TRUEPLUS PEN NEEDLES) 32G X 4 MM MISC USE AS DIRECTED 100 each 12  . LANTUS SOLOSTAR 100 UNIT/ML Solostar Pen INJECT 40 UNITS INTO THE SKIN DAILY AT 10 PM. 15 mL 2  . liraglutide (  VICTOZA) 18 MG/3ML SOPN INJECT 0.6 MG DAILY X1 WEEK, THEN 1.2 MG DAILY X1 WEEK, THEN 1.8 MG DAILY THEREAFTER. 9 mL 2  . lisinopril (PRINIVIL,ZESTRIL) 5 MG tablet Take 1 tablet (5 mg total) by mouth daily. 90 tablet 3  . metFORMIN (GLUCOPHAGE) 1000 MG tablet TAKE 1 TABLET (1,000 MG TOTAL) BY MOUTH 2 (TWO) TIMES DAILY WITH A MEAL. 180 tablet 0  . omega-3 acid ethyl esters (LOVAZA) 1 g capsule TAKE 2 CAPSULES BY MOUTH 2 TIMES DAILY. 120 capsule 0  . TRUEPLUS LANCETS 28G MISC 1 each by Does not apply route 4 (four) times daily - after meals and at bedtime. 100 each 12   No facility-administered medications prior to visit.     Allergies  Allergen Reactions  . Lipitor [Atorvastatin] Rash       Objective:    BP 118/84 (BP Location: Right Arm, Patient Position: Sitting, Cuff Size: Large)   Pulse (!) 104   Temp 98.1 F (36.7 C) (Oral)   Ht 6' (1.829 m)   Wt 250 lb (113.4 kg)   SpO2 98%   BMI 33.91 kg/m  Wt Readings from Last 3 Encounters:  11/05/18 250 lb (113.4 kg)  07/10/18 243 lb 3.2 oz (110.3 kg)  04/06/18 254 lb 9.6 oz (115.5 kg)    Physical Exam Vitals signs and nursing note reviewed.  Constitutional:      Appearance: He is well-developed.  HENT:     Head: Normocephalic and atraumatic.  Neck:     Musculoskeletal: Normal range of motion.  Cardiovascular:     Rate and Rhythm: Regular rhythm. Tachycardia present.     Heart sounds:  Normal heart sounds. No murmur. No friction rub. No gallop.   Pulmonary:     Effort: Pulmonary effort is normal. No tachypnea or respiratory distress.     Breath sounds: Normal breath sounds. No decreased breath sounds, wheezing, rhonchi or rales.  Chest:     Chest wall: No tenderness.  Abdominal:     General: Bowel sounds are normal.     Palpations: Abdomen is soft.  Musculoskeletal: Normal range of motion.  Skin:    General: Skin is warm and dry.  Neurological:     Mental Status: He is alert and oriented to person, place, and time.     Coordination: Coordination normal.  Psychiatric:        Behavior: Behavior normal. Behavior is cooperative.        Thought Content: Thought content normal.        Judgment: Judgment normal.          Patient has been counseled extensively about nutrition and exercise as well as the importance of adherence with medications and regular follow-up. The patient was given clear instructions to go to ER or return to medical center if symptoms don't improve, worsen or new problems develop. The patient verbalized understanding.   Follow-up: Return in about 3 months (around 02/04/2019).   Gildardo Pounds, FNP-BC [facility] [place], [place]   11/06/2018, 10:41 PM

## 2018-11-06 ENCOUNTER — Encounter: Payer: Self-pay | Admitting: Nurse Practitioner

## 2018-11-06 ENCOUNTER — Other Ambulatory Visit: Payer: Self-pay | Admitting: Nurse Practitioner

## 2018-11-06 DIAGNOSIS — R972 Elevated prostate specific antigen [PSA]: Secondary | ICD-10-CM

## 2018-11-06 LAB — MICROSCOPIC EXAMINATION: RBC, Urine: NONE SEEN /hpf (ref 0–2)

## 2018-11-06 LAB — BASIC METABOLIC PANEL
BUN/Creatinine Ratio: 12 (ref 9–20)
BUN: 12 mg/dL (ref 6–24)
CO2: 23 mmol/L (ref 20–29)
Calcium: 9.9 mg/dL (ref 8.7–10.2)
Chloride: 99 mmol/L (ref 96–106)
Creatinine, Ser: 0.97 mg/dL (ref 0.76–1.27)
GFR calc Af Amer: 100 mL/min/{1.73_m2} (ref >59)
GFR calc non Af Amer: 86 mL/min/{1.73_m2} (ref >59)
Glucose: 132 mg/dL — ABNORMAL HIGH (ref 65–99)
Potassium: 4.2 mmol/L (ref 3.5–5.2)
Sodium: 136 mmol/L (ref 134–144)

## 2018-11-06 LAB — LIPID PANEL
Chol/HDL Ratio: 4.6 ratio (ref 0.0–5.0)
Cholesterol, Total: 151 mg/dL (ref 100–199)
HDL: 33 mg/dL — ABNORMAL LOW (ref >39)
LDL Chol Calc (NIH): 101 mg/dL — ABNORMAL HIGH (ref 0–99)
Triglycerides: 91 mg/dL (ref 0–149)
VLDL Cholesterol Cal: 17 mg/dL (ref 5–40)

## 2018-11-06 LAB — URINALYSIS, COMPLETE
Bilirubin, UA: NEGATIVE
Glucose, UA: NEGATIVE
Ketones, UA: NEGATIVE
Nitrite, UA: NEGATIVE
Protein,UA: NEGATIVE
RBC, UA: NEGATIVE
Specific Gravity, UA: 1.016 (ref 1.005–1.030)
Urobilinogen, Ur: 0.2 mg/dL (ref 0.2–1.0)
pH, UA: 5.5 (ref 5.0–7.5)

## 2018-11-06 LAB — PSA: Prostate Specific Ag, Serum: 53.4 ng/mL — ABNORMAL HIGH (ref 0.0–4.0)

## 2018-11-09 ENCOUNTER — Telehealth: Payer: Self-pay | Admitting: Nurse Practitioner

## 2018-11-09 LAB — CULTURE, URINE COMPREHENSIVE

## 2018-11-09 NOTE — Telephone Encounter (Signed)
Will route to PCP for Urine Culture results.

## 2018-11-09 NOTE — Telephone Encounter (Signed)
Patient called to get their lab results. Please follow up.  °

## 2018-11-10 ENCOUNTER — Other Ambulatory Visit: Payer: Self-pay | Admitting: Nurse Practitioner

## 2018-11-10 MED ORDER — CIPROFLOXACIN HCL 500 MG PO TABS: 500.0000 mg | ORAL_TABLET | Freq: Two times a day (BID) | ORAL | 0 refills | Status: DC

## 2018-11-10 MED ORDER — CIPROFLOXACIN HCL 500 MG PO TABS: 500.0000 mg | ORAL_TABLET | Freq: Two times a day (BID) | ORAL | 0 refills | Status: AC

## 2018-11-10 NOTE — Telephone Encounter (Signed)
Please see lab notes 

## 2018-11-12 MED FILL — CIPROFLOXACIN HCL 500 MG TA: 500 | 10 days supply | Qty: 20 | Fill #0

## 2018-12-04 MED FILL — GEMFIBROZIL 600 MG TAB: 600 | 30 days supply | Qty: 60 | Fill #0

## 2018-12-04 MED FILL — OMEGA-3 ETHYL ESTERS 1 GM C: 1 | 30 days supply | Qty: 60 | Fill #0

## 2018-12-04 MED FILL — $VICTOZA 2-PAK 18MG/3ML PEN: 18 | 30 days supply | Qty: 9 | Fill #0

## 2018-12-04 MED FILL — LISINOPRIL 5 MG TABLET: 5 | 30 days supply | Qty: 30 | Fill #6

## 2018-12-04 MED FILL — !LANTUS SOLOSTAR 100UNITS/M: 100 | 30 days supply | Qty: 15 | Fill #0

## 2018-12-24 MED FILL — TRUEPLUS PEN NDL 32GX5/32: 32G X 4 MM | 30 days supply | Qty: 100 | Fill #0

## 2018-12-28 MED FILL — OMEGA-3 ETHYL ESTERS 1 GM C: 1 | 30 days supply | Qty: 60 | Fill #1

## 2019-01-02 MED FILL — LISINOPRIL 5 MG TABLET: 5 | 30 days supply | Qty: 30 | Fill #7

## 2019-01-02 MED FILL — GEMFIBROZIL 600 MG TAB: 600 | 30 days supply | Qty: 60 | Fill #1

## 2019-01-02 MED FILL — metFORMIN HCL 1000 MG TABS: 1000 | 30 days supply | Qty: 60 | Fill #0

## 2019-01-03 ENCOUNTER — Other Ambulatory Visit: Payer: Self-pay

## 2019-01-03 ENCOUNTER — Emergency Department (HOSPITAL_COMMUNITY): Payer: Self-pay

## 2019-01-03 ENCOUNTER — Emergency Department (HOSPITAL_COMMUNITY)
Admission: EM | Admit: 2019-01-03 | Discharge: 2019-01-03 | Disposition: A | Discharge status: Home / Self Care | Payer: Self-pay | Attending: Emergency Medicine | Admitting: Emergency Medicine

## 2019-01-03 ENCOUNTER — Encounter (HOSPITAL_COMMUNITY): Payer: Self-pay | Admitting: Emergency Medicine

## 2019-01-03 DIAGNOSIS — Z79899 Other long term (current) drug therapy: Secondary | ICD-10-CM | POA: Insufficient documentation

## 2019-01-03 DIAGNOSIS — N3001 Acute cystitis with hematuria: Secondary | ICD-10-CM | POA: Insufficient documentation

## 2019-01-03 DIAGNOSIS — R509 Fever, unspecified: Secondary | ICD-10-CM | POA: Insufficient documentation

## 2019-01-03 DIAGNOSIS — Z7982 Long term (current) use of aspirin: Secondary | ICD-10-CM | POA: Insufficient documentation

## 2019-01-03 DIAGNOSIS — Z794 Long term (current) use of insulin: Secondary | ICD-10-CM | POA: Insufficient documentation

## 2019-01-03 DIAGNOSIS — I2510 Atherosclerotic heart disease of native coronary artery without angina pectoris: Secondary | ICD-10-CM | POA: Insufficient documentation

## 2019-01-03 DIAGNOSIS — E1165 Type 2 diabetes mellitus with hyperglycemia: Secondary | ICD-10-CM | POA: Insufficient documentation

## 2019-01-03 DIAGNOSIS — F17210 Nicotine dependence, cigarettes, uncomplicated: Secondary | ICD-10-CM | POA: Insufficient documentation

## 2019-01-03 LAB — PROTIME-INR
INR: 1 (ref 0.8–1.2)
Prothrombin Time: 13.4 seconds (ref 11.4–15.2)

## 2019-01-03 LAB — CBC WITH DIFFERENTIAL/PLATELET
Abs Immature Granulocytes: 0.05 10*3/uL (ref 0.00–0.07)
Basophils Absolute: 0.1 10*3/uL (ref 0.0–0.1)
Basophils Relative: 0 %
Eosinophils Absolute: 0.1 10*3/uL (ref 0.0–0.5)
Eosinophils Relative: 1 %
HCT: 44.1 % (ref 39.0–52.0)
Hemoglobin: 15 g/dL (ref 13.0–17.0)
Immature Granulocytes: 0 %
Lymphocytes Relative: 8 %
Lymphs Abs: 1.1 10*3/uL (ref 0.7–4.0)
MCH: 32.6 pg (ref 26.0–34.0)
MCHC: 34 g/dL (ref 30.0–36.0)
MCV: 95.9 fL (ref 80.0–100.0)
Monocytes Absolute: 1.3 10*3/uL — ABNORMAL HIGH (ref 0.1–1.0)
Monocytes Relative: 10 %
Neutro Abs: 11.2 10*3/uL — ABNORMAL HIGH (ref 1.7–7.7)
Neutrophils Relative %: 81 %
Platelets: 223 10*3/uL (ref 150–400)
RBC: 4.6 MIL/uL (ref 4.22–5.81)
RDW: 12 % (ref 11.5–15.5)
WBC: 13.8 10*3/uL — ABNORMAL HIGH (ref 4.0–10.5)
nRBC: 0 % (ref 0.0–0.2)

## 2019-01-03 LAB — COMPREHENSIVE METABOLIC PANEL
ALT: 39 U/L (ref 0–44)
AST: 26 U/L (ref 15–41)
Albumin: 4.3 g/dL (ref 3.5–5.0)
Alkaline Phosphatase: 99 U/L (ref 38–126)
Anion gap: 13 (ref 5–15)
BUN: 19 mg/dL (ref 6–20)
CO2: 21 mmol/L — ABNORMAL LOW (ref 22–32)
Calcium: 9.7 mg/dL (ref 8.9–10.3)
Chloride: 100 mmol/L (ref 98–111)
Creatinine, Ser: 0.97 mg/dL (ref 0.61–1.24)
GFR calc Af Amer: >60 mL/min (ref >60)
GFR calc non Af Amer: >60 mL/min (ref >60)
Glucose, Bld: 255 mg/dL — ABNORMAL HIGH (ref 70–99)
Potassium: 4 mmol/L (ref 3.5–5.1)
Sodium: 134 mmol/L — ABNORMAL LOW (ref 135–145)
Total Bilirubin: 0.9 mg/dL (ref 0.3–1.2)
Total Protein: 8.2 g/dL — ABNORMAL HIGH (ref 6.5–8.1)

## 2019-01-03 LAB — URINALYSIS, ROUTINE W REFLEX MICROSCOPIC
Bilirubin Urine: NEGATIVE
Glucose, UA: 150 mg/dL — AB
Ketones, ur: NEGATIVE mg/dL
Leukocytes,Ua: NEGATIVE
Nitrite: NEGATIVE
Protein, ur: NEGATIVE mg/dL
RBC / HPF: >50 RBC/hpf — ABNORMAL HIGH (ref 0–5)
Specific Gravity, Urine: 1.021 (ref 1.005–1.030)
pH: 7 (ref 5.0–8.0)

## 2019-01-03 LAB — CBG MONITORING, ED: Glucose-Capillary: 238 mg/dL — ABNORMAL HIGH (ref 70–99)

## 2019-01-03 LAB — LACTIC ACID, PLASMA
Lactic Acid, Venous: 2.4 mmol/L (ref 0.5–1.9)
Lactic Acid, Venous: 1.4 mmol/L (ref 0.5–1.9)

## 2019-01-03 MED ORDER — CEPHALEXIN 500 MG PO CAPS: 500.0000 mg | ORAL_CAPSULE | Freq: Four times a day (QID) | ORAL | 0 refills | Status: DC

## 2019-01-03 MED ORDER — SODIUM CHLORIDE 0.9 % IV BOLUS
1000.0000 mL | Freq: Once | INTRAVENOUS | Status: AC
  Administered 2019-01-03: 17:00:00 1000 mL via INTRAVENOUS

## 2019-01-03 MED ORDER — ONDANSETRON 4 MG PO TBDP: ORAL_TABLET | ORAL | 0 refills | Status: DC

## 2019-01-03 MED ORDER — SODIUM CHLORIDE 0.9 % IV SOLN
1.0000 g | Freq: Once | INTRAVENOUS | Status: AC
  Administered 2019-01-03: 1 g via INTRAVENOUS
  Filled 2019-01-03: qty 10

## 2019-01-03 NOTE — ED Triage Notes (Signed)
Pt reports he self caths and today having dysuria, fever, and blood sugar was 409.

## 2019-01-03 NOTE — Discharge Instructions (Addendum)
Drink plenty of fluids.  Take Tylenol for fever.  Follow-up with your doctor next week for recheck and return here sooner if any problems

## 2019-01-03 NOTE — ED Provider Notes (Signed)
[facility] DEPT Provider Note   CSN: [id number] Arrival date & time: 01/03/19  1336     History   Chief Complaint Chief Complaint  Patient presents with  . Dysuria  . Fever  . Hyperglycemia    HPI Shane Bond is a 58 y.o. male.     Patient complains of fevers chills and dysuria.  Patient self caths.  Patient gets numerous UTIs.  He has had some nausea vomiting  The history is provided by the patient. No language interpreter was used.  Dysuria Presenting symptoms: dysuria   Context: not after injury   Relieved by:  None tried Worsened by:  Nothing Ineffective treatments:  None tried Associated symptoms: fever   Associated symptoms: no abdominal pain, no diarrhea, no hematuria and no urinary frequency   Risk factors: no bladder surgery   Fever Associated symptoms: dysuria   Associated symptoms: no chest pain, no congestion, no cough, no diarrhea, no headaches and no rash   Hyperglycemia Associated symptoms: dysuria and fever   Associated symptoms: no abdominal pain, no chest pain and no fatigue     Past Medical History:  Diagnosis Date  . 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 Bladder atonia   . BPH (benign prostatic hyperplasia)   . CAD, NATIVE VESSEL 10/29/2009  . Diabetes mellitus type 2, uncontrolled, with complications ([place])   . Hyperlipidemia LDL goal <70   . OBESITY 10/22/2009    Patient Active Problem List   Diagnosis Date Noted  . Type 2 diabetes mellitus with hyperglycemia, with long-term current use of insulin ([place]) 04/13/2017  . Diabetes mellitus without complication ([place]) [id number]  . Former smoker 11/02/2015  . Diabetes type 2, uncontrolled ([place]) 06/18/2015  . Hyperlipidemia 06/18/2015  . BPH (benign prostatic hyperplasia) 06/18/2015  . Self-catheterizes urinary bladder 06/18/2015  . HTN (hypertension) 07/08/2010  . CAD, NATIVE VESSEL 10/29/2009  . 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 BACK SURGERY    . CORONARY STENT PLACEMENT    . CYSTOSCOPY W/ URETERAL STENT PLACEMENT  02/26/2012   Procedure: CYSTOSCOPY WITH RETROGRADE PYELOGRAM/URETERAL STENT PLACEMENT;  Surgeon: Bernestine Amass, MD;  Location: [facility];  Service: Urology;  Laterality: Left;        Home Medications    Prior to Admission medications   Medication Sig Start Date End Date Taking? Authorizing Provider  aspirin EC 81 MG tablet Take 81 mg by mouth daily.   Yes [provider]  gemfibrozil (LOPID) 600 MG tablet Take 1 tablet (600 mg total) by mouth 2 (two) times daily before a meal. 11/05/18 02/03/19 Yes Gildardo Pounds, NP  liraglutide (VICTOZA) 18 MG/3ML SOPN INJECT 0.6 MG DAILY X1 WEEK, THEN 1.2 MG DAILY X1 WEEK, THEN 1.8 MG DAILY THEREAFTER. Patient taking differently: Inject 1.8 mg into the skin daily.  11/05/18  Yes Gildardo Pounds, NP  lisinopril (ZESTRIL) 5 MG tablet Take 1 tablet (5 mg total) by mouth daily. 11/05/18  Yes Gildardo Pounds, NP  metFORMIN (GLUCOPHAGE) 1000 MG tablet Take 1 tablet (1,000 mg total) by mouth 2 (two) times daily with a meal. 11/05/18  Yes Gildardo Pounds, NP  omega-3 acid ethyl esters (LOVAZA) 1 g capsule Take 1 capsule (1 g total) by mouth 2 (two) times daily. 11/05/18 02/03/19 Yes Gildardo Pounds, NP  cephALEXin (KEFLEX) 500 MG capsule Take 1 capsule (500 mg total) by mouth 4 (four) times daily. 01/03/19  Milton Ferguson, MD  glucose blood (TRUE METRIX BLOOD GLUCOSE TEST) test strip Use as instructed 11/05/18   Gildardo Pounds, NP  Insulin Glargine (LANTUS SOLOSTAR) 100 UNIT/ML Solostar Pen INJECT 50 UNITS INTO THE SKIN DAILY AT 10 PM. Patient taking differently: Inject 50 Units into the skin daily at 10 pm.  11/05/18   Gildardo Pounds, NP  Insulin Pen Needle (TRUEPLUS PEN NEEDLES) 32G X 4 MM MISC USE AS DIRECTED 11/05/18   Gildardo Pounds, NP  Misc. Devices MISC Please provide patient with standard sized 34F urinary catheters for self  cathing along with lubricant. 05/29/18   Gildardo Pounds, NP  ondansetron (ZOFRAN ODT) 4 MG disintegrating tablet 4mg  ODT q4 hours prn nausea/vomit 01/03/19   Milton Ferguson, MD  TRUEplus Lancets 28G MISC 1 each by Does not apply route 4 (four) times daily - after meals and at bedtime. 11/05/18   Gildardo Pounds, NP    Family History Family History  Problem Relation Age of Onset  . Diabetes Father   . Coronary artery disease Other   . Heart attack Other   . Diabetes Paternal Aunt     Social History Social History   Tobacco Use  . Smoking status: Current Every Day Smoker    Types: E-cigarettes  . Smokeless tobacco: Former Network engineer Use Topics  . Alcohol use: No  . Drug use: No     Allergies   Lipitor [atorvastatin]   Review of Systems Review of Systems  Constitutional: Positive for fever. Negative for appetite change and fatigue.  HENT: Negative for congestion, ear discharge and sinus pressure.   Eyes: Negative for discharge.  Respiratory: Negative for cough.   Cardiovascular: Negative for chest pain.  Gastrointestinal: Negative for abdominal pain and diarrhea.  Genitourinary: Positive for dysuria. Negative for frequency and hematuria.  Musculoskeletal: Negative for back pain.  Skin: Negative for rash.  Neurological: Negative for seizures and headaches.  Psychiatric/Behavioral: Negative for hallucinations.     Physical Exam Updated Vital Signs BP 132/81 (BP Location: Left Arm)   Pulse (!) 101   Temp 98.8 F (37.1 C) (Oral)   Resp 18   SpO2 97%   Physical Exam Vitals signs and nursing note reviewed.  Constitutional:      Appearance: He is well-developed.  HENT:     Head: Normocephalic.     Nose: Nose normal.  Eyes:     General: No scleral icterus.    Conjunctiva/sclera: Conjunctivae normal.  Neck:     Musculoskeletal: Neck supple.     Thyroid: No thyromegaly.  Cardiovascular:     Rate and Rhythm: Normal rate and regular rhythm.     Heart  sounds: No murmur. No friction rub. No gallop.   Pulmonary:     Breath sounds: No stridor. No wheezing or rales.  Chest:     Chest wall: No tenderness.  Abdominal:     General: There is no distension.     Tenderness: There is no abdominal tenderness. There is no rebound.  Musculoskeletal: Normal range of motion.  Lymphadenopathy:     Cervical: No cervical adenopathy.  Skin:    Findings: No erythema or rash.  Neurological:     Mental Status: He is alert and oriented to person, place, and time.     Motor: No abnormal muscle tone.     Coordination: Coordination normal.  Psychiatric:        Behavior: Behavior normal.      ED Treatments /  Results  Labs (all labs ordered are listed, but only abnormal results are displayed) Labs Reviewed  COMPREHENSIVE METABOLIC PANEL - Abnormal; Notable for the following components:      Result Value   Sodium 134 (*)    CO2 21 (*)    Glucose, Bld 255 (*)    Total Protein 8.2 (*)    All other components within normal limits  LACTIC ACID, PLASMA - Abnormal; Notable for the following components:   Lactic Acid, Venous 2.4 (*)    All other components within normal limits  CBC WITH DIFFERENTIAL/PLATELET - Abnormal; Notable for the following components:   WBC 13.8 (*)    Neutro Abs 11.2 (*)    Monocytes Absolute 1.3 (*)    All other components within normal limits  URINALYSIS, ROUTINE W REFLEX MICROSCOPIC - Abnormal; Notable for the following components:   APPearance HAZY (*)    Glucose, UA 150 (*)    Hgb urine dipstick SMALL (*)    RBC / HPF >50 (*)    Bacteria, UA RARE (*)    All other components within normal limits  CBG MONITORING, ED - Abnormal; Notable for the following components:   Glucose-Capillary 238 (*)    All other components within normal limits  CULTURE, BLOOD (ROUTINE X 2)  CULTURE, BLOOD (ROUTINE X 2)  URINE CULTURE  LACTIC ACID, PLASMA  PROTIME-INR    EKG None  Radiology Dg Chest 2 View  Result Date:  01/03/2019 CLINICAL DATA:  Fever, dysuria. EXAM: CHEST - 2 VIEW COMPARISON:  October 13, 2009. FINDINGS: The heart size and mediastinal contours are within normal limits. Both lungs are clear. The visualized skeletal structures are unremarkable. IMPRESSION: No active cardiopulmonary disease. Electronically Signed   By: Marijo Conception M.D.   On: 01/03/2019 16:18    Procedures Procedures (including critical care time)  Medications Ordered in ED Medications  cefTRIAXone (ROCEPHIN) 1 g in sodium chloride 0.9 % 100 mL IVPB (0 g Intravenous Stopped 01/03/19 1701)  sodium chloride 0.9 % bolus 1,000 mL (1,000 mLs Intravenous New Bag/Given 01/03/19 1632)     Initial Impression / Assessment and Plan / ED Course  I have reviewed the triage vital signs and the nursing notes.  Pertinent labs & imaging results that were available during my care of the patient were reviewed by me and considered in my medical decision making (see chart for details).        Patient with urinary tract infection.  Patient improved with fluids and antibiotics.  Patient wanted to be discharged home and be treated outpatient.  Patient given Keflex and Zofran will follow-up with his PCP  Final Clinical Impressions(s) / ED Diagnoses   Final diagnoses:  Acute cystitis with hematuria    ED Discharge Orders         Ordered    cephALEXin (KEFLEX) 500 MG capsule  4 times daily     01/03/19 1844    ondansetron (ZOFRAN ODT) 4 MG disintegrating tablet     01/03/19 1844           Milton Ferguson, MD 01/03/19 1846

## 2019-01-03 NOTE — ED Notes (Addendum)
Date and time results received: 01/03/19 3:39 PM  Test: Lactic Critical Value: 2.4  Name of Provider Notified: [place]  Orders Received? Or Actions Taken?: Awaiting further orders

## 2019-01-05 LAB — URINE CULTURE: Culture: >=100000 — AB

## 2019-01-08 LAB — CULTURE, BLOOD (ROUTINE X 2)
Culture: NO GROWTH
Culture: NO GROWTH
Special Requests: ADEQUATE
Special Requests: ADEQUATE

## 2019-01-15 MED FILL — !LANTUS SOLOSTAR 100UNITS/M: 100 | 30 days supply | Qty: 15 | Fill #1

## 2019-01-30 MED FILL — LISINOPRIL 5 MG TABLET: 5 | 30 days supply | Qty: 30 | Fill #8

## 2019-01-30 MED FILL — GEMFIBROZIL 600 MG TAB: 600 | 30 days supply | Qty: 60 | Fill #2

## 2019-01-30 MED FILL — metFORMIN HCL 1000 MG TABS: 1000 | 30 days supply | Qty: 60 | Fill #1

## 2019-01-30 MED FILL — OMEGA-3 ETHYL ESTERS 1 GM C: 1 | 30 days supply | Qty: 60 | Fill #2

## 2019-01-30 MED FILL — TRUE METRIX TEST STRIP: 30 days supply | Qty: 100 | Fill #1

## 2019-02-05 ENCOUNTER — Ambulatory Visit: Payer: Self-pay | Attending: Nurse Practitioner | Admitting: Nurse Practitioner

## 2019-02-05 ENCOUNTER — Other Ambulatory Visit: Payer: Self-pay

## 2019-02-05 ENCOUNTER — Encounter: Payer: Self-pay | Admitting: Nurse Practitioner

## 2019-02-05 DIAGNOSIS — R972 Elevated prostate specific antigen [PSA]: Secondary | ICD-10-CM

## 2019-02-05 DIAGNOSIS — Z794 Long term (current) use of insulin: Secondary | ICD-10-CM

## 2019-02-05 DIAGNOSIS — F17200 Nicotine dependence, unspecified, uncomplicated: Secondary | ICD-10-CM

## 2019-02-05 DIAGNOSIS — E782 Mixed hyperlipidemia: Secondary | ICD-10-CM

## 2019-02-05 DIAGNOSIS — E785 Hyperlipidemia, unspecified: Secondary | ICD-10-CM

## 2019-02-05 DIAGNOSIS — E1165 Type 2 diabetes mellitus with hyperglycemia: Secondary | ICD-10-CM

## 2019-02-05 NOTE — Progress Notes (Signed)
Virtual Visit via Telephone Note Due to national recommendations of social distancing due to [place] 19, telehealth visit is felt to be most appropriate for this patient at this time.  I discussed the limitations, risks, security and privacy concerns of performing an evaluation and management service by telephone and the availability of in person appointments. I also discussed with the patient that there may be a patient responsible charge related to this service. The patient expressed understanding and agreed to proceed.    I connected with Shane Bond on 02/05/19  at   9:10 AM EST  EDT by telephone and verified that I am speaking with the correct person using two identifiers.   Consent I discussed the limitations, risks, security and privacy concerns of performing an evaluation and management service by telephone and the availability of in person appointments. I also discussed with the patient that there may be a patient responsible charge related to this service. The patient expressed understanding and agreed to proceed.   Location of Patient: Private Residence   Location of Provider: [place] and [employer] Office    Persons participating in Telemedicine visit: [name] FNP-BC [place]    History of Present Illness: Telemedicine visit for: DM TYPE 2 HE STILL HAS NOT FOLLOWED UP WITH UROLOGY FOR HIS PSA!!!!!! I have strongly tried to encourage him to establish care with urology and even recommended he speak with them regarding a payment plan for office visits and possible treatments. He is aware that his elevated PSA level could be related to Prostate cancer.  Lab Results  Component Value Date   PSA1 53.4 (H) 11/05/2018   PSA1 53.2 (H) 07/10/2018   PSA1 46.4 (H) 04/06/2018    DM TYPE 2 Labile blood glucose readings. CBG 60s fasting. Instructed him to decrease his nighttime lantus by 2 units every 3 days for fasting blood glucose 70 or less.  Current medications include victoza 1.8 mg daily, metformin 1000 mg BID.  LDL not at goal. He has been prescribed Lovaza and lopid which he is not consistent with taking. On Low dose ACE.  Lab Results  Component Value Date   HGBA1C 7.6 (A) 11/05/2018   Lab Results  Component Value Date   LDLCALC 101 (H) 11/05/2018     Past Medical History:  Diagnosis Date  . 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 Bladder atonia   . BPH (benign prostatic hyperplasia)   . CAD, NATIVE VESSEL 10/29/2009  . Diabetes mellitus type 2, uncontrolled, with complications ([place])   . Hyperlipidemia LDL goal <70   . Kidney stone   . OBESITY 10/22/2009    Past Surgical History:  Procedure Laterality Date  . BACK SURGERY    . CORONARY STENT PLACEMENT    . CYSTOSCOPY W/ URETERAL STENT PLACEMENT  02/26/2012   Procedure: CYSTOSCOPY WITH RETROGRADE PYELOGRAM/URETERAL STENT PLACEMENT;  Surgeon: Bernestine Amass, MD;  Location: [facility];  Service: Urology;  Laterality: Left;    Family History  Problem Relation Age of Onset  . Diabetes Father   . Coronary artery disease Other   . Heart attack Other   . Diabetes Paternal Aunt     Social History   Socioeconomic History  . Marital status: Single    Spouse name: Not on file  . Number of children: Not on file  . Years of education: Not on file  . Highest education level: Not on file  Occupational History  . Not on file  Tobacco Use  . Smoking status: Current Every Day Smoker    Types: E-cigarettes  . Smokeless tobacco: Former Network engineer and Sexual Activity  . Alcohol use: No  . Drug use: No  . Sexual activity: Not Currently  Other Topics Concern  . Not on file  Social History Narrative  . Not on file   Social Determinants of Health   Financial Resource Strain:   . Difficulty of Paying Living Expenses: Not on file  Food Insecurity:   . Worried About Charity fundraiser in the Last Year: Not on file  . Ran Out of Food in the Last Year: Not on file   Transportation Needs:   . Lack of Transportation (Medical): Not on file  . Lack of Transportation (Non-Medical): Not on file  Physical Activity:   . Days of Exercise per Week: Not on file  . Minutes of Exercise per Session: Not on file  Stress:   . Feeling of Stress : Not on file  Social Connections:   . Frequency of Communication with Friends and Family: Not on file  . Frequency of Social Gatherings with Friends and Family: Not on file  . Attends Religious Services: Not on file  . Active Member of Clubs or Organizations: Not on file  . Attends Archivist Meetings: Not on file  . Marital Status: Not on file     Observations/Objective: Awake, alert and oriented x 3   Review of Systems  Constitutional: Negative for fever, malaise/fatigue and weight loss.  HENT: Negative.  Negative for nosebleeds.   Eyes: Negative.  Negative for blurred vision, double vision and photophobia.  Respiratory: Negative.  Negative for cough and shortness of breath.   Cardiovascular: Negative.  Negative for chest pain, palpitations and leg swelling.  Gastrointestinal: Negative.  Negative for heartburn, nausea and vomiting.  Genitourinary:       BPH  Musculoskeletal: Negative.  Negative for myalgias.  Neurological: Negative.  Negative for dizziness, focal weakness, seizures and headaches.  Psychiatric/Behavioral: Negative.  Negative for suicidal ideas.    Assessment and Plan: [name] was seen today for follow-up.  Diagnoses and all orders for this visit:  Elevated PSA, greater than or equal to 20 ng/ml Patient has been instructed to follow up with UROLOGY ASAP.   Tobacco dependence Thao was counseled on the dangers of tobacco use, and was advised to quit. Reviewed strategies to maximize success, including removing cigarettes and smoking materials from environment, stress management and support of family/friends as well as pharmacological alternatives including: Wellbutrin, Chantix,  Nicotine patch, Nicotine gum or lozenges. Smoking cessation support: smoking cessation hotline: 1-800-QUIT-NOW.  Smoking cessation classes are also available through [facility]. Call [phone] or visit our website at [email or web address]   A total of 3 minutes was spent on counseling for smoking cessation and Montie is not ready to quit.   Dyslipidemia, goal LDL below 70 INSTRUCTIONS: Work on a low fat, heart healthy diet and participate in regular aerobic exercise program by working out at least 150 minutes per week; 5 days a week-30 minutes per day. Avoid red meat/beef/steak,  fried foods. junk foods, sodas, sugary drinks, unhealthy snacking, alcohol and smoking.  Drink at least 80 oz of water per day and monitor your carbohydrate intake daily.  -     omega-3 acid ethyl esters (LOVAZA) 1 g capsule; Take 1 capsule (1 g total) by mouth 2 (two) times daily. -     gemfibrozil (LOPID) 600  MG tablet; Take 1 tablet (600 mg total) by mouth 2 (two) times daily before a meal. INSTRUCTIONS: Work on a low fat, heart healthy diet and participate in regular aerobic exercise program by working out at least 150 minutes per week; 5 days a week-30 minutes per day. Avoid red meat/beef/steak,  fried foods. junk foods, sodas, sugary drinks, unhealthy snacking, alcohol and smoking.  Drink at least 80 oz of water per day and monitor your carbohydrate intake daily.     Uncontrolled type 2 diabetes mellitus with hyperglycemia, with long-term current use of insulin (HCC) -     omega-3 acid ethyl esters (LOVAZA) 1 g capsule; Take 1 capsule (1 g total) by mouth 2 (two) times daily.     Follow Up Instructions Return in about 5 weeks (around 03/12/2019).     I discussed the assessment and treatment plan with the patient. The patient was provided an opportunity to ask questions and all were answered. The patient agreed with the plan and demonstrated an understanding of the instructions.    The patient was advised to call back or seek an in-person evaluation if the symptoms worsen or if the condition fails to improve as anticipated.  I provided 19 minutes of non-face-to-face time during this encounter including median intraservice time, reviewing previous notes, labs, imaging, medications and explaining diagnosis and management.  Gildardo Pounds, FNP-BC

## 2019-02-12 ENCOUNTER — Ambulatory Visit: Payer: Self-pay | Attending: Nurse Practitioner

## 2019-02-12 ENCOUNTER — Other Ambulatory Visit: Payer: Self-pay

## 2019-02-12 DIAGNOSIS — E1165 Type 2 diabetes mellitus with hyperglycemia: Secondary | ICD-10-CM

## 2019-02-12 DIAGNOSIS — Z794 Long term (current) use of insulin: Secondary | ICD-10-CM

## 2019-02-13 ENCOUNTER — Other Ambulatory Visit: Payer: Self-pay

## 2019-02-14 ENCOUNTER — Emergency Department (HOSPITAL_COMMUNITY)
Admission: EM | Admit: 2019-02-14 | Discharge: 2019-02-14 | Disposition: A | Discharge status: Home / Self Care | Payer: Self-pay | Attending: Emergency Medicine | Admitting: Emergency Medicine

## 2019-02-14 ENCOUNTER — Other Ambulatory Visit: Payer: Self-pay

## 2019-02-14 ENCOUNTER — Encounter (HOSPITAL_COMMUNITY): Payer: Self-pay

## 2019-02-14 DIAGNOSIS — Z7982 Long term (current) use of aspirin: Secondary | ICD-10-CM | POA: Insufficient documentation

## 2019-02-14 DIAGNOSIS — N23 Unspecified renal colic: Secondary | ICD-10-CM | POA: Insufficient documentation

## 2019-02-14 DIAGNOSIS — E119 Type 2 diabetes mellitus without complications: Secondary | ICD-10-CM | POA: Insufficient documentation

## 2019-02-14 DIAGNOSIS — Z794 Long term (current) use of insulin: Secondary | ICD-10-CM | POA: Insufficient documentation

## 2019-02-14 DIAGNOSIS — Z79899 Other long term (current) drug therapy: Secondary | ICD-10-CM | POA: Insufficient documentation

## 2019-02-14 DIAGNOSIS — R1032 Left lower quadrant pain: Secondary | ICD-10-CM | POA: Insufficient documentation

## 2019-02-14 DIAGNOSIS — Z955 Presence of coronary angioplasty implant and graft: Secondary | ICD-10-CM | POA: Insufficient documentation

## 2019-02-14 DIAGNOSIS — I2510 Atherosclerotic heart disease of native coronary artery without angina pectoris: Secondary | ICD-10-CM | POA: Insufficient documentation

## 2019-02-14 DIAGNOSIS — R110 Nausea: Secondary | ICD-10-CM | POA: Insufficient documentation

## 2019-02-14 DIAGNOSIS — F17290 Nicotine dependence, other tobacco product, uncomplicated: Secondary | ICD-10-CM | POA: Insufficient documentation

## 2019-02-14 HISTORY — DX: Calculus of kidney: N20.0

## 2019-02-14 LAB — URINALYSIS, ROUTINE W REFLEX MICROSCOPIC
Bilirubin Urine: NEGATIVE
Glucose, UA: >=500 mg/dL — AB
Ketones, ur: 5 mg/dL — AB
Nitrite: NEGATIVE
Protein, ur: NEGATIVE mg/dL
RBC / HPF: >50 RBC/hpf — ABNORMAL HIGH (ref 0–5)
Specific Gravity, Urine: 1.012 (ref 1.005–1.030)
pH: 7 (ref 5.0–8.0)

## 2019-02-14 MED ORDER — ONDANSETRON 8 MG PO TBDP
8.0000 mg | ORAL_TABLET | Freq: Once | ORAL | Status: AC
  Administered 2019-02-14: 8 mg via ORAL

## 2019-02-14 MED ORDER — ONDANSETRON 8 MG PO TBDP: 8.0000 mg | ORAL_TABLET | Freq: Three times a day (TID) | ORAL | 0 refills | Status: DC | PRN

## 2019-02-14 MED ORDER — HYDROMORPHONE HCL 2 MG PO TABS: 4.0000 mg | ORAL_TABLET | ORAL | 0 refills | Status: DC | PRN

## 2019-02-14 NOTE — ED Provider Notes (Signed)
[facility] DEPT Provider Note: Georgena Spurling, MD, FACEP  CSN: [id number] MRN: [id number] ARRIVAL: 02/14/19 at [place]: WA03/WA03   CHIEF COMPLAINT  Flank Pain   HISTORY OF PRESENT ILLNESS  02/14/19 12:12 AM Shane Bond is a 58 y.o. male with a history of bladder atonia who self catheterizes to void.  He is here with left flank pain that began about 5 PM.  The pain was fairly constant until about 30 minutes ago when the pain resolved.  It resolved after taking an over-the-counter analgesic.  He has had some nausea with this which persists but has had no vomiting.  His pain was moderate, radiated to his left lower quadrant, and he characterizes it as like previous kidney stones.   Past Medical History:  Diagnosis Date  . 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 Bladder atonia   . BPH (benign prostatic hyperplasia)   . CAD, NATIVE VESSEL 10/29/2009  . Diabetes mellitus type 2, uncontrolled, with complications ([place])   . Hyperlipidemia LDL goal <70   . Kidney stone   . OBESITY 10/22/2009    Past Surgical History:  Procedure Laterality Date  . BACK SURGERY    . CORONARY STENT PLACEMENT    . CYSTOSCOPY W/ URETERAL STENT PLACEMENT  02/26/2012   Procedure: CYSTOSCOPY WITH RETROGRADE PYELOGRAM/URETERAL STENT PLACEMENT;  Surgeon: Bernestine Amass, MD;  Location: [facility];  Service: Urology;  Laterality: Left;    Family History  Problem Relation Age of Onset  . Diabetes Father   . Coronary artery disease Other   . Heart attack Other   . Diabetes Paternal Aunt     Social History   Tobacco Use  . Smoking status: Current Every Day Smoker    Types: E-cigarettes  . Smokeless tobacco: Former Network engineer Use Topics  . Alcohol use: No  . Drug use: No    Prior to Admission medications   Medication Sig Start Date End Date Taking? Authorizing Provider  aspirin EC 81 MG tablet Take 81 mg by mouth daily.   Yes [provider]  gemfibrozil (LOPID) 600 MG tablet Take 1 tablet  (600 mg total) by mouth 2 (two) times daily before a meal. 11/05/18 02/14/19 Yes Gildardo Pounds, NP  Insulin Glargine (LANTUS SOLOSTAR) 100 UNIT/ML Solostar Pen INJECT 50 UNITS INTO THE SKIN DAILY AT 10 PM. Patient taking differently: Inject 50 Units into the skin daily at 10 pm.  11/05/18  Yes Gildardo Pounds, NP  liraglutide (VICTOZA) 18 MG/3ML SOPN INJECT 0.6 MG DAILY X1 WEEK, THEN 1.2 MG DAILY X1 WEEK, THEN 1.8 MG DAILY THEREAFTER. Patient taking differently: Inject 1.8 mg into the skin daily.  11/05/18  Yes Gildardo Pounds, NP  lisinopril (ZESTRIL) 5 MG tablet Take 1 tablet (5 mg total) by mouth daily. 11/05/18  Yes Gildardo Pounds, NP  metFORMIN (GLUCOPHAGE) 1000 MG tablet Take 1 tablet (1,000 mg total) by mouth 2 (two) times daily with a meal. 11/05/18  Yes Gildardo Pounds, NP  omega-3 acid ethyl esters (LOVAZA) 1 g capsule Take 1 capsule (1 g total) by mouth 2 (two) times daily. 11/05/18 02/14/19 Yes Gildardo Pounds, NP  glucose blood (TRUE METRIX BLOOD GLUCOSE TEST) test strip Use as instructed 11/05/18   Gildardo Pounds, NP  HYDROmorphone (DILAUDID) 2 MG tablet Take 2 tablets (4 mg total) by mouth every 4 (four) hours as needed for severe pain. 02/14/19   Kabao Leite, Jenny Reichmann, MD  Insulin Pen Needle (TRUEPLUS PEN  NEEDLES) 32G X 4 MM MISC USE AS DIRECTED 11/05/18   Gildardo Pounds, NP  Misc. Devices MISC Please provide patient with standard sized 5F urinary catheters for self cathing along with lubricant. 05/29/18   Gildardo Pounds, NP  ondansetron (ZOFRAN ODT) 8 MG disintegrating tablet Take 1 tablet (8 mg total) by mouth every 8 (eight) hours as needed for nausea or vomiting. 02/14/19   Kenadee Gates, MD  TRUEplus Lancets 28G MISC 1 each by Does not apply route 4 (four) times daily - after meals and at bedtime. 11/05/18   Gildardo Pounds, NP    Allergies Lipitor [atorvastatin]   REVIEW OF SYSTEMS  Negative except as noted here or in the History of Present Illness.   PHYSICAL EXAMINATION   Initial Vital Signs Blood pressure 126/90, pulse (!) 103, temperature 98 F (36.7 C), temperature source Oral, SpO2 95 %.  Examination General: Well-developed, well-nourished male in no acute distress; appearance consistent with age of record HENT: normocephalic; atraumatic Eyes: pupils equal, round and reactive to light; extraocular muscles intact Neck: supple Heart: regular rate and rhythm Lungs: clear to auscultation bilaterally Abdomen: soft; nondistended; nontender; bowel sounds present Extremities: No deformity; full range of motion; pulses normal Neurologic: Awake, alert and oriented; motor function intact in all extremities and symmetric; no facial droop Skin: Warm and dry Psychiatric: Normal mood and affect   RESULTS  Summary of this visit's results, reviewed and interpreted by myself:   EKG Interpretation  Date/Time:    Ventricular Rate:    PR Interval:    QRS Duration:   QT Interval:    QTC Calculation:   R Axis:     Text Interpretation:        Laboratory Studies: Results for orders placed or performed during the hospital encounter of 02/14/19 (from the past 24 hour(s))  Urinalysis, Routine w reflex microscopic- may I&O cath if menses     Status: Abnormal   Collection Time: 02/14/19 12:10 AM  Result Value Ref Range   Color, Urine YELLOW YELLOW   APPearance HAZY (A) CLEAR   Specific Gravity, Urine 1.012 1.005 - 1.030   pH 7.0 5.0 - 8.0   Glucose, UA >=500 (A) NEGATIVE mg/dL   Hgb urine dipstick MODERATE (A) NEGATIVE   Bilirubin Urine NEGATIVE NEGATIVE   Ketones, ur 5 (A) NEGATIVE mg/dL   Protein, ur NEGATIVE NEGATIVE mg/dL   Nitrite NEGATIVE NEGATIVE   Leukocytes,Ua TRACE (A) NEGATIVE   RBC / HPF >50 (H) 0 - 5 RBC/hpf   WBC, UA 11-20 0 - 5 WBC/hpf   Bacteria, UA RARE (A) NONE SEEN   Squamous Epithelial / LPF 0-5 0 - 5   Imaging Studies: No results found.  ED COURSE and MDM  Nursing notes, initial and subsequent vitals signs, including pulse  oximetry, reviewed and interpreted by myself.  Vitals:   02/14/19 0011 02/14/19 0227  BP: 126/90 114/79  Pulse: (!) 103 76  Resp:  16  Temp: 98 F (36.7 C) 98.6 F (37 C)  TempSrc: Oral Oral  SpO2: 95% 97%   Medications  ondansetron (ZOFRAN-ODT) disintegrating tablet 8 mg (8 mg Oral Given 02/14/19 0035)   3:14 AM Patient has had no pain while in the ED.  He may very well have passed the stone.  I do not wish to put him through the radiation and expense of a CT scan at this time.  We will prescribe pain and nausea medications and refer to urology and have him  return to the ED if symptoms become uncontrolled.  Urine sent for culture.  PROCEDURES  Procedures   ED DIAGNOSES     ICD-10-CM   1. Ureteral colic  [id number]        Bonny Vanleeuwen, MD 02/14/19 [phone]

## 2019-02-14 NOTE — ED Notes (Signed)
Pt cath himself

## 2019-02-14 NOTE — ED Triage Notes (Signed)
Patient arrived with complaints of left sided flank pain that started around 5pm today. Reports taking pain medication at home but now having some nausea. Uses catheter to urinate.

## 2019-02-16 LAB — URINE CULTURE: Culture: >=100000 — AB

## 2019-02-17 NOTE — Progress Notes (Signed)
ED Antimicrobial Stewardship Positive Culture Follow Up   Shane Bond is an 58 y.o. male who presented to [facility] on 02/14/2019 with a chief complaint of  Chief Complaint  Patient presents with  . Flank Pain    Recent Results (from the past 720 hour(s))  Urine culture     Status: Abnormal   Collection Time: 02/14/19 12:20 AM   Specimen: Urine  Result Value Ref Range Status   Specimen Description   Final    Urine Performed at [facility] [address] [place], [place] [address]    Special Requests   Final    NONE Performed at [facility], [place] [address], [place], [place] [address]    Culture >=100,000 COLONIES/mL STAPHYLOCOCCUS EPIDERMIDIS (A)  Final   Report Status 02/16/2019 FINAL  Final   Organism ID, Bacteria STAPHYLOCOCCUS EPIDERMIDIS (A)  Final      Susceptibility   Staphylococcus epidermidis - MIC*    CIPROFLOXACIN >=8 RESISTANT Resistant     ERYTHROMYCIN >=8 RESISTANT Resistant     GENTAMICIN <=0.5 SENSITIVE Sensitive     OXACILLIN <=0.25 SENSITIVE Sensitive     TETRACYCLINE 2 SENSITIVE Sensitive     VANCOMYCIN <=0.5 SENSITIVE Sensitive     TRIMETH/SULFA <=10 SENSITIVE Sensitive     CLINDAMYCIN <=0.25 SENSITIVE Sensitive     RIFAMPIN <=0.5 SENSITIVE Sensitive     Inducible Clindamycin NEGATIVE Sensitive     * >=100,000 COLONIES/mL STAPHYLOCOCCUS EPIDERMIDIS    Allergies  Allergen Reactions  . Lipitor [Atorvastatin] Rash    [x]  Patient discharged originally without antimicrobial agent and treatment may now be indicated  Plan: -Call patient and assess for symptoms of UTI (fever, N/V, dysuria, suprapubic pain, flank pain, increased urinary frequency/urgency). If asymptomatic, no treatment indicated. If symptomatic, prescribe cephalexin.   New antibiotic prescription: Cephalexin 500 mg PO q6h for 7 days. No refill.   ED Provider: Arlean Hopping, PA-C  Lenis Noon, PharmD 02/17/2019, 1:17 PM Clinical  Pharmacist [phone]

## 2019-02-18 ENCOUNTER — Emergency Department (HOSPITAL_COMMUNITY)
Admission: EM | Admit: 2019-02-18 | Discharge: 2019-02-18 | Discharge status: Against Medical Advice | Payer: Self-pay | Attending: Emergency Medicine | Admitting: Emergency Medicine

## 2019-02-18 ENCOUNTER — Encounter (HOSPITAL_COMMUNITY): Payer: Self-pay | Admitting: Emergency Medicine

## 2019-02-18 ENCOUNTER — Telehealth: Payer: Self-pay | Admitting: Nurse Practitioner

## 2019-02-18 ENCOUNTER — Ambulatory Visit: Payer: Self-pay | Attending: Nurse Practitioner | Admitting: Pharmacist

## 2019-02-18 ENCOUNTER — Other Ambulatory Visit: Payer: Self-pay

## 2019-02-18 ENCOUNTER — Telehealth: Payer: Self-pay | Admitting: Emergency Medicine

## 2019-02-18 DIAGNOSIS — Z23 Encounter for immunization: Secondary | ICD-10-CM

## 2019-02-18 DIAGNOSIS — Z5321 Procedure and treatment not carried out due to patient leaving prior to being seen by health care provider: Secondary | ICD-10-CM | POA: Insufficient documentation

## 2019-02-18 LAB — CBC
HCT: 43 % (ref 39.0–52.0)
Hemoglobin: 14.5 g/dL (ref 13.0–17.0)
MCH: 32.3 pg (ref 26.0–34.0)
MCHC: 33.7 g/dL (ref 30.0–36.0)
MCV: 95.8 fL (ref 80.0–100.0)
Platelets: 207 10*3/uL (ref 150–400)
RBC: 4.49 MIL/uL (ref 4.22–5.81)
RDW: 11.9 % (ref 11.5–15.5)
WBC: 8.2 10*3/uL (ref 4.0–10.5)
nRBC: 0 % (ref 0.0–0.2)

## 2019-02-18 LAB — BASIC METABOLIC PANEL
Anion gap: 11 (ref 5–15)
BUN: 18 mg/dL (ref 6–20)
CO2: 22 mmol/L (ref 22–32)
Calcium: 9.4 mg/dL (ref 8.9–10.3)
Chloride: 99 mmol/L (ref 98–111)
Creatinine, Ser: 1.13 mg/dL (ref 0.61–1.24)
GFR calc Af Amer: >60 mL/min (ref >60)
GFR calc non Af Amer: >60 mL/min (ref >60)
Glucose, Bld: 174 mg/dL — ABNORMAL HIGH (ref 70–99)
Potassium: 4.1 mmol/L (ref 3.5–5.1)
Sodium: 132 mmol/L — ABNORMAL LOW (ref 135–145)

## 2019-02-18 LAB — URINALYSIS, ROUTINE W REFLEX MICROSCOPIC
Bilirubin Urine: NEGATIVE
Glucose, UA: NEGATIVE mg/dL
Hgb urine dipstick: NEGATIVE
Ketones, ur: 5 mg/dL — AB
Leukocytes,Ua: NEGATIVE
Nitrite: POSITIVE — AB
Protein, ur: NEGATIVE mg/dL
Specific Gravity, Urine: 1.02 (ref 1.005–1.030)
pH: 6 (ref 5.0–8.0)

## 2019-02-18 NOTE — Progress Notes (Signed)
Patient presents for vaccination against influenza per orders of Zelda. Consent given. Counseling provided. No contraindications exists. Vaccine administered without incident.   

## 2019-02-18 NOTE — Telephone Encounter (Signed)
Post ED Visit - Positive Culture Follow-up: Successful Patient Follow-Up  Culture assessed and recommendations reviewed by:  [x]  Elenor Quinones, Pharm.D. []  Heide Guile, Pharm.D., BCPS AQ-ID []  Parks Neptune, Pharm.D., BCPS []  Alycia Rossetti, Pharm.D., BCPS []  [place]as, Pharm.D., BCPS, AAHIVP []  Legrand Como, Pharm.D., BCPS, AAHIVP []  Salome Arnt, PharmD, BCPS []  Johnnette Gourd, PharmD, BCPS []  Hughes Better, PharmD, BCPS []  Leeroy Cha, PharmD  Positive urine culture  []  Patient discharged without antimicrobial prescription and treatment is now indicated []  Organism is resistant to prescribed ED discharge antimicrobial []  Patient with positive blood cultures  Changes discussed with ED provider: Arlean Hopping PA New antibiotic prescription symptom check, if symptoms start keflex 500mg  po q 6 hours x 7 days   Attempting to contact patient  Hazle Nordmann 02/18/2019, 12:56 PM

## 2019-02-18 NOTE — ED Notes (Signed)
Pt says that he wants to go to another hospital that is "not so busy". Encouraged pt to return if he wishes and follow up with provider.

## 2019-02-18 NOTE — Telephone Encounter (Signed)
Patient sister calling in regards to lab results. Says that he is feeling bad and wants to know if she needs to take him to the hospital.   Please reach out to patient's sister as soon as you can.

## 2019-02-18 NOTE — ED Triage Notes (Signed)
Pt arrives to ED from home with complaints of left sided flank pain since 12/24. States that yesterday he saw blood in his urine.

## 2019-02-19 ENCOUNTER — Emergency Department (HOSPITAL_COMMUNITY)
Admission: EM | Admit: 2019-02-19 | Discharge: 2019-02-19 | Disposition: A | Discharge status: Home / Self Care | Payer: Self-pay | Attending: Emergency Medicine | Admitting: Emergency Medicine

## 2019-02-19 ENCOUNTER — Encounter (HOSPITAL_COMMUNITY): Payer: Self-pay | Admitting: *Deleted

## 2019-02-19 ENCOUNTER — Emergency Department (HOSPITAL_COMMUNITY): Payer: Self-pay

## 2019-02-19 DIAGNOSIS — R8271 Bacteriuria: Secondary | ICD-10-CM | POA: Insufficient documentation

## 2019-02-19 DIAGNOSIS — E119 Type 2 diabetes mellitus without complications: Secondary | ICD-10-CM | POA: Insufficient documentation

## 2019-02-19 DIAGNOSIS — F17290 Nicotine dependence, other tobacco product, uncomplicated: Secondary | ICD-10-CM | POA: Insufficient documentation

## 2019-02-19 DIAGNOSIS — Z79899 Other long term (current) drug therapy: Secondary | ICD-10-CM | POA: Insufficient documentation

## 2019-02-19 DIAGNOSIS — Z794 Long term (current) use of insulin: Secondary | ICD-10-CM | POA: Insufficient documentation

## 2019-02-19 DIAGNOSIS — I10 Essential (primary) hypertension: Secondary | ICD-10-CM | POA: Insufficient documentation

## 2019-02-19 DIAGNOSIS — Z7982 Long term (current) use of aspirin: Secondary | ICD-10-CM | POA: Insufficient documentation

## 2019-02-19 DIAGNOSIS — N200 Calculus of kidney: Secondary | ICD-10-CM | POA: Insufficient documentation

## 2019-02-19 DIAGNOSIS — I2510 Atherosclerotic heart disease of native coronary artery without angina pectoris: Secondary | ICD-10-CM | POA: Insufficient documentation

## 2019-02-19 LAB — BASIC METABOLIC PANEL
Anion gap: 11 (ref 5–15)
BUN: 21 mg/dL — ABNORMAL HIGH (ref 6–20)
CO2: 21 mmol/L — ABNORMAL LOW (ref 22–32)
Calcium: 9 mg/dL (ref 8.9–10.3)
Chloride: 99 mmol/L (ref 98–111)
Creatinine, Ser: 1.17 mg/dL (ref 0.61–1.24)
GFR calc Af Amer: >60 mL/min (ref >60)
GFR calc non Af Amer: >60 mL/min (ref >60)
Glucose, Bld: 126 mg/dL — ABNORMAL HIGH (ref 70–99)
Potassium: 5.2 mmol/L — ABNORMAL HIGH (ref 3.5–5.1)
Sodium: 131 mmol/L — ABNORMAL LOW (ref 135–145)

## 2019-02-19 LAB — CBC WITH DIFFERENTIAL/PLATELET
Abs Immature Granulocytes: 0.06 10*3/uL (ref 0.00–0.07)
Basophils Absolute: 0 10*3/uL (ref 0.0–0.1)
Basophils Relative: 0 %
Eosinophils Absolute: 0.1 10*3/uL (ref 0.0–0.5)
Eosinophils Relative: 1 %
HCT: 32.7 % — ABNORMAL LOW (ref 39.0–52.0)
Hemoglobin: 10.7 g/dL — ABNORMAL LOW (ref 13.0–17.0)
Immature Granulocytes: 1 %
Lymphocytes Relative: 12 %
Lymphs Abs: 1.1 10*3/uL (ref 0.7–4.0)
MCH: 32.1 pg (ref 26.0–34.0)
MCHC: 32.7 g/dL (ref 30.0–36.0)
MCV: 98.2 fL (ref 80.0–100.0)
Monocytes Absolute: 1.2 10*3/uL — ABNORMAL HIGH (ref 0.1–1.0)
Monocytes Relative: 13 %
Neutro Abs: 6.6 10*3/uL (ref 1.7–7.7)
Neutrophils Relative %: 73 %
Platelets: 168 10*3/uL (ref 150–400)
RBC: 3.33 MIL/uL — ABNORMAL LOW (ref 4.22–5.81)
RDW: 12.1 % (ref 11.5–15.5)
WBC: 9.1 10*3/uL (ref 4.0–10.5)
nRBC: 0 % (ref 0.0–0.2)

## 2019-02-19 LAB — URINALYSIS, ROUTINE W REFLEX MICROSCOPIC
Bilirubin Urine: NEGATIVE
Glucose, UA: NEGATIVE mg/dL
Ketones, ur: NEGATIVE mg/dL
Nitrite: NEGATIVE
Protein, ur: NEGATIVE mg/dL
Specific Gravity, Urine: 1.009 (ref 1.005–1.030)
WBC, UA: >50 WBC/hpf — ABNORMAL HIGH (ref 0–5)
pH: 6 (ref 5.0–8.0)

## 2019-02-19 MED ORDER — ACETAMINOPHEN 500 MG PO TABS
1000.0000 mg | ORAL_TABLET | Freq: Once | ORAL | Status: AC
  Administered 2019-02-19: 1000 mg via ORAL
  Filled 2019-02-19: qty 2

## 2019-02-19 MED ORDER — ONDANSETRON 4 MG PO TBDP: ORAL_TABLET | ORAL | 0 refills | Status: DC

## 2019-02-19 MED ORDER — TAMSULOSIN HCL 0.4 MG PO CAPS: 0.4000 mg | ORAL_CAPSULE | Freq: Every day | ORAL | 0 refills | Status: DC

## 2019-02-19 MED ORDER — MORPHINE SULFATE (PF) 4 MG/ML IV SOLN
4.0000 mg | Freq: Once | INTRAVENOUS | Status: AC
  Administered 2019-02-19: 4 mg via INTRAVENOUS
  Filled 2019-02-19: qty 1

## 2019-02-19 MED ORDER — ONDANSETRON HCL 4 MG/2ML IJ SOLN
4.0000 mg | Freq: Once | INTRAMUSCULAR | Status: AC
  Administered 2019-02-19: 4 mg via INTRAVENOUS
  Filled 2019-02-19: qty 2

## 2019-02-19 MED ORDER — MORPHINE SULFATE 15 MG PO TABS: 15.0000 mg | ORAL_TABLET | ORAL | 0 refills | Status: DC | PRN

## 2019-02-19 MED ORDER — SULFAMETHOXAZOLE-TRIMETHOPRIM 800-160 MG PO TABS: 1.0000 | ORAL_TABLET | Freq: Two times a day (BID) | ORAL | 0 refills | Status: AC

## 2019-02-19 MED ORDER — SULFAMETHOXAZOLE-TRIMETHOPRIM 800-160 MG PO TABS
1.0000 | ORAL_TABLET | Freq: Once | ORAL | Status: AC
  Administered 2019-02-19: 1 via ORAL
  Filled 2019-02-19: qty 1

## 2019-02-19 MED FILL — ONDANSETRON ODT 4 MG TABLET: 4 | 3 days supply | Qty: 20 | Fill #0

## 2019-02-19 MED FILL — SULFAMETHOXAZOLE-TMP DS TAB: 800-160 | 7 days supply | Qty: 14 | Fill #0

## 2019-02-19 MED FILL — TAMSULOSIN HCL 0.4 MG CAP: 0.4 | 30 days supply | Qty: 30 | Fill #0

## 2019-02-19 NOTE — ED Notes (Signed)
Spoke with lab and they will process urine culture.
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
## 2019-02-19 NOTE — ED Triage Notes (Signed)
Per EMS, pt complains of left flank pain radiating to his left abdomen. Pt noticed blood in his urine for the past 3 days. VSS, A7O x 4. Pt has hx of kidney stones.

## 2019-02-19 NOTE — Discharge Instructions (Signed)
You have bacteria in your urine.  This could make you really sick with a kidney stone.  If you develop a fever or start having urinary symptoms that are concerning for your urinary tract infections and please return to the ED for evaluation.

## 2019-02-22 LAB — URINE CULTURE: Culture: >=100000 — AB

## 2019-02-24 ENCOUNTER — Telehealth: Payer: Self-pay | Admitting: Emergency Medicine

## 2019-02-24 ENCOUNTER — Encounter: Payer: Self-pay | Admitting: Nurse Practitioner

## 2019-02-24 MED ORDER — GEMFIBROZIL 600 MG PO TABS: 600.0000 mg | ORAL_TABLET | Freq: Two times a day (BID) | ORAL | 1 refills | Status: DC

## 2019-02-24 MED ORDER — OMEGA-3-ACID ETHYL ESTERS 1 G PO CAPS: 1.0000 g | ORAL_CAPSULE | Freq: Two times a day (BID) | ORAL | 1 refills | Status: DC

## 2019-02-24 NOTE — Telephone Encounter (Signed)
Post ED Visit - Positive Culture Follow-up  Culture report reviewed by antimicrobial stewardship pharmacist: [facility] Team []  Elenor Quinones, Pharm.D. []  Heide Guile, Pharm.D., BCPS AQ-ID []  Parks Neptune, Pharm.D., BCPS []  Alycia Rossetti, Pharm.D., BCPS []  [place], Pharm.D., BCPS, AAHIVP []  Legrand Como, Pharm.D., BCPS, AAHIVP []  Salome Arnt, PharmD, BCPS []  Johnnette Gourd, PharmD, BCPS []  Hughes Better, PharmD, BCPS []  Leeroy Cha, PharmD []  Laqueta Linden, PharmD, BCPS []  Albertina Parr, PharmD  [place] Team [x]  Leodis Sias, PharmD []  Lindell Spar, PharmD []  Royetta Asal, PharmD []  Graylin Shiver, Rph []  Rema Fendt) Glennon Mac, PharmD []  Arlyn Dunning, PharmD []  Netta Cedars, PharmD []  Dia Sitter, PharmD []  Leone Haven, PharmD []  Gretta Arab, PharmD []  Theodis Shove, PharmD []  Peggyann Juba, PharmD []  Reuel Boom, PharmD   Positive urine culture Treated with Sulfamethoxazole-Trimethoprim, organism sensitive to the same and no further patient follow-up is required at this time.  Sandi Raveling Cortnie Ringel 02/24/2019, 11:17 AM

## 2019-02-25 MED FILL — OMEGA-3 ETHYL ESTERS 1 GM C: 1 | 30 days supply | Qty: 60 | Fill #0

## 2019-02-26 NOTE — Telephone Encounter (Signed)
Pt. Went to the ED.

## 2019-02-27 MED FILL — LISINOPRIL 5 MG TABLET: 5 | 30 days supply | Qty: 30 | Fill #9

## 2019-02-27 MED FILL — !LANTUS SOLOSTAR 100UNITS/M: 100 | 30 days supply | Qty: 15 | Fill #2

## 2019-02-27 MED FILL — $VICTOZA 2-PAK 18MG/3ML PEN: 18 | 30 days supply | Qty: 9 | Fill #2

## 2019-02-27 MED FILL — GEMFIBROZIL 600 MG TAB: 600 | 30 days supply | Qty: 60 | Fill #3

## 2019-02-27 MED FILL — TRUEPLUS PEN NDL 32GX5/32: 32G X 4 MM | 30 days supply | Qty: 100 | Fill #1

## 2019-02-27 MED FILL — metFORMIN HCL 1000 MG TABS: 1000 | 30 days supply | Qty: 60 | Fill #2

## 2019-02-27 MED FILL — TRUEPLUS PEN NDL 32GX5/32": 32G X 4 MM | 30 days supply | Qty: 100 | Fill #1

## 2019-03-07 ENCOUNTER — Telehealth: Payer: Self-pay

## 2019-03-07 NOTE — Telephone Encounter (Signed)
Pt returned call from letter to home for symptom check RE: [facility] ED 02/14/2019  Pt states he is fine  No abx ordered

## 2019-03-28 MED FILL — SULFAMETHOXAZOLE-TMP DS TAB: 800-160 | 30 days supply | Qty: 60 | Fill #0

## 2019-04-04 ENCOUNTER — Other Ambulatory Visit: Payer: Self-pay | Admitting: Nurse Practitioner

## 2019-04-04 DIAGNOSIS — E1165 Type 2 diabetes mellitus with hyperglycemia: Secondary | ICD-10-CM

## 2019-04-04 MED FILL — metFORMIN HCL 1000 MG TABS: 1000 | 30 days supply | Qty: 60 | Fill #3

## 2019-04-04 MED FILL — LISINOPRIL 5 MG TABLET: 5 | 30 days supply | Qty: 30 | Fill #10

## 2019-04-04 MED FILL — OMEGA-3 ETHYL ESTERS 1 GM C: 1 | 30 days supply | Qty: 60 | Fill #1

## 2019-04-04 MED FILL — GEMFIBROZIL 600 MG TAB: 600 | 30 days supply | Qty: 60 | Fill #4

## 2019-04-05 MED FILL — !LANTUS SOLOSTAR 100UNITS/M: 100 | 30 days supply | Qty: 15 | Fill #0

## 2019-04-05 MED FILL — $VICTOZA 2-PAK 18MG/3ML PEN: 18 | 30 days supply | Qty: 9 | Fill #0

## 2019-05-01 ENCOUNTER — Telehealth: Payer: Self-pay | Admitting: Nurse Practitioner

## 2019-05-01 NOTE — Telephone Encounter (Signed)
Spoke to patient's sister and informed he needs to apply for the CAFA. CMA had informed sister that patient has been informed to apply for CAFA for a while. Spoke to the referral coordinator and CAFA do cover urologist but only in [place] .  Patient's sister said they will try to apply for the CAFA.

## 2019-05-01 NOTE — Telephone Encounter (Signed)
Patient sister called saying that patient went to [facility] and had blood work done and urinalysis. Patient received a call yesterday informing him that he has prostate cancer. Patient needs to have a biopsy done and it's expensive to have it done at [facility] and was told to follow up with his PCP. Please f/u for advise.

## 2019-05-07 MED FILL — !LANTUS SOLOSTAR 100UNITS/M: 100 | 18 days supply | Qty: 9 | Fill #1

## 2019-05-07 MED FILL — LISINOPRIL 5 MG TABLET: 5 | 30 days supply | Qty: 30 | Fill #0

## 2019-05-07 MED FILL — OMEGA-3 ETHYL ESTERS 1 GM C: 1 | 30 days supply | Qty: 60 | Fill #2

## 2019-05-07 MED FILL — metFORMIN HCL 1000 MG TABS: 1000 | 30 days supply | Qty: 60 | Fill #4

## 2019-05-07 MED FILL — $VICTOZA 2-PAK 18MG/3ML PEN: 18 | 30 days supply | Qty: 9 | Fill #1

## 2019-05-07 MED FILL — GEMFIBROZIL 600 MG TAB: 600 | 30 days supply | Qty: 60 | Fill #5

## 2019-05-08 ENCOUNTER — Telehealth: Payer: Self-pay | Admitting: Nurse Practitioner

## 2019-05-08 NOTE — Telephone Encounter (Signed)
Patricia sister(called) states Muhammadyusuf may have a UTI and would like antibiotics strongler than the last one she requests sent to pharmacy at [facility]. Please call Mardene Celeste as soon as possible.

## 2019-05-09 NOTE — Telephone Encounter (Signed)
He will need a lab appointment for urine and culture.

## 2019-05-10 NOTE — Telephone Encounter (Signed)
Attempt to reach patient. No answer and LVM for callback.

## 2019-05-14 ENCOUNTER — Other Ambulatory Visit: Payer: Self-pay

## 2019-05-14 ENCOUNTER — Ambulatory Visit: Payer: Self-pay | Attending: Nurse Practitioner | Admitting: Physician Assistant

## 2019-05-14 VITALS — BP 111/75 | HR 109 | Temp 98.0°F | Resp 18 | Ht 72.0 in | Wt 250.0 lb

## 2019-05-14 DIAGNOSIS — Z794 Long term (current) use of insulin: Secondary | ICD-10-CM

## 2019-05-14 DIAGNOSIS — E1165 Type 2 diabetes mellitus with hyperglycemia: Secondary | ICD-10-CM

## 2019-05-14 DIAGNOSIS — R739 Hyperglycemia, unspecified: Secondary | ICD-10-CM

## 2019-05-14 DIAGNOSIS — Z789 Other specified health status: Secondary | ICD-10-CM

## 2019-05-14 DIAGNOSIS — N3090 Cystitis, unspecified without hematuria: Secondary | ICD-10-CM

## 2019-05-14 LAB — POCT URINALYSIS DIP (CLINITEK)
Bilirubin, UA: NEGATIVE
Glucose, UA: >=1000 mg/dL — AB
Ketones, POC UA: NEGATIVE mg/dL
Nitrite, UA: NEGATIVE
POC PROTEIN,UA: NEGATIVE
Spec Grav, UA: 1.02 (ref 1.010–1.025)
Urobilinogen, UA: 0.2 E.U./dL
pH, UA: 6.5 (ref 5.0–8.0)

## 2019-05-14 LAB — GLUCOSE, POCT (MANUAL RESULT ENTRY)
POC Glucose: 333 mg/dl — AB (ref 70–99)
POC Glucose: 342 mg/dl — AB (ref 70–99)

## 2019-05-14 LAB — POCT GLYCOSYLATED HEMOGLOBIN (HGB A1C): Hemoglobin A1C: 8.2 % — AB (ref 4.0–5.6)

## 2019-05-14 MED ORDER — SULFAMETHOXAZOLE-TRIMETHOPRIM 800-160 MG PO TABS: 1.0000 | ORAL_TABLET | Freq: Two times a day (BID) | ORAL | 0 refills | Status: AC

## 2019-05-14 MED ORDER — INSULIN ASPART 100 UNIT/ML ~~LOC~~ SOLN
10.0000 [IU] | Freq: Once | SUBCUTANEOUS | Status: AC
  Administered 2019-05-14: 10 [IU] via SUBCUTANEOUS

## 2019-05-14 MED FILL — SULFAMETHOXAZOLE-TMP DS TAB: 800-160 | 10 days supply | Qty: 20 | Fill #0

## 2019-05-14 NOTE — Patient Instructions (Signed)
Continue working on controlling your blood sugars, sterile environment during catheterizations.  I sent a prescription for Bactrim to your pharmacy, make sure to complete this prescription, increase hydration.  Continue with your appointment at the [facility] and getting information from the IRS so you can apply for the financial assistance.   Urinary Tract Infection, Adult  A urinary tract infection (UTI) is an infection of any part of the urinary tract. The urinary tract includes the kidneys, ureters, bladder, and urethra. These organs make, store, and get rid of urine in the body. Your health care provider may use other names to describe the infection. An upper UTI affects the ureters and kidneys (pyelonephritis). A lower UTI affects the bladder (cystitis) and urethra (urethritis). What are the causes? Most urinary tract infections are caused by bacteria in your genital area, around the entrance to your urinary tract (urethra). These bacteria grow and cause inflammation of your urinary tract. What increases the risk? You are more likely to develop this condition if:  You have a urinary catheter that stays in place (indwelling).  You are not able to control when you urinate or have a bowel movement (you have incontinence).  You are male and you: ? Use a spermicide or diaphragm for birth control. ? Have low estrogen levels. ? Are pregnant.  You have certain genes that increase your risk (genetics).  You are sexually active.  You take antibiotic medicines.  You have a condition that causes your flow of urine to slow down, such as: ? An enlarged prostate, if you are male. ? Blockage in your urethra (stricture). ? A kidney stone. ? A nerve condition that affects your bladder control (neurogenic bladder). ? Not getting enough to drink, or not urinating often.  You have certain medical conditions, such as: ? Diabetes. ? A weak disease-fighting system (immunesystem). ? Sickle cell  disease. ? Gout. ? Spinal cord injury. What are the signs or symptoms? Symptoms of this condition include:  Needing to urinate right away (urgently).  Frequent urination or passing small amounts of urine frequently.  Pain or burning with urination.  Blood in the urine.  Urine that smells bad or unusual.  Trouble urinating.  Cloudy urine.  Vaginal discharge, if you are male.  Pain in the abdomen or the lower back. You may also have:  Vomiting or a decreased appetite.  Confusion.  Irritability or tiredness.  A fever.  Diarrhea. The first symptom in older adults may be confusion. In some cases, they may not have any symptoms until the infection has worsened. How is this diagnosed? This condition is diagnosed based on your medical history and a physical exam. You may also have other tests, including:  Urine tests.  Blood tests.  Tests for sexually transmitted infections (STIs). If you have had more than one UTI, a cystoscopy or imaging studies may be done to determine the cause of the infections. How is this treated? Treatment for this condition includes:  Antibiotic medicine.  Over-the-counter medicines to treat discomfort.  Drinking enough water to stay hydrated. If you have frequent infections or have other conditions such as a kidney stone, you may need to see a health care provider who specializes in the urinary tract (urologist). In rare cases, urinary tract infections can cause sepsis. Sepsis is a life-threatening condition that occurs when the body responds to an infection. Sepsis is treated in the hospital with IV antibiotics, fluids, and other medicines. Follow these instructions at home:  Medicines  Take over-the-counter  and prescription medicines only as told by your health care provider.  If you were prescribed an antibiotic medicine, take it as told by your health care provider. Do not stop using the antibiotic even if you start to feel better.  General instructions  Make sure you: ? Empty your bladder often and completely. Do not hold urine for long periods of time. ? Empty your bladder after sex. ? Wipe from front to back after a bowel movement if you are male. Use each tissue one time when you wipe.  Drink enough fluid to keep your urine pale yellow.  Keep all follow-up visits as told by your health care provider. This is important. Contact a health care provider if:  Your symptoms do not get better after 1-2 days.  Your symptoms go away and then return. Get help right away if you have:  Severe pain in your back or your lower abdomen.  A fever.  Nausea or vomiting. Summary  A urinary tract infection (UTI) is an infection of any part of the urinary tract, which includes the kidneys, ureters, bladder, and urethra.  Most urinary tract infections are caused by bacteria in your genital area, around the entrance to your urinary tract (urethra).  Treatment for this condition often includes antibiotic medicines.  If you were prescribed an antibiotic medicine, take it as told by your health care provider. Do not stop using the antibiotic even if you start to feel better.  Keep all follow-up visits as told by your health care provider. This is important. This information is not intended to replace advice given to you by your health care provider. Make sure you discuss any questions you have with your health care provider. Document Revised: 01/25/2018 Document Reviewed: 08/17/2017 Elsevier Patient Education  2020 [employer].

## 2019-05-14 NOTE — Progress Notes (Signed)
Established Patient Office Visit  Subjective:  Patient ID: Shane Bond, male    DOB: February 26, 1960  Age: 59 y.o. MRN: [id number]  CC: No chief complaint on file.   HPI MAKHAI OUTING reports that he started having urinary tract infection symptoms 2 to 3 days ago.  Reports significant history with UTIs, attributes this due to catheter use.  Reports that he has had increased frequency, dysuria.  Reports that he was last seen at the emergency department at [facility] on February 19, 2019 and was diagnosed with urinary tract infection, states that he was given Bactrim, states that his symptoms did not resolve completely, does state that he feels he needs something stronger this time.  Reports that he has an appointment to see urology on April 9, [facility] in [facility], for elevated PSA.  Reports that he is working on getting in ID made at the [facility] and getting paperwork from the IRS in order to be able to apply for financial assistance.  Reports that he did not take any medications today, states that he is compliant to his medications in general.  Reports that he checks his blood sugars on a daily basis, states that he has had a few lower readings of 160.   Past Medical History:  Diagnosis Date  . 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 Bladder atonia   . BPH (benign prostatic hyperplasia)   . CAD, NATIVE VESSEL 10/29/2009  . Diabetes mellitus type 2, uncontrolled, with complications ([place])   . Hyperlipidemia LDL goal <70   . Kidney stone   . OBESITY 10/22/2009    Past Surgical History:  Procedure Laterality Date  . BACK SURGERY    . CORONARY STENT PLACEMENT    . CYSTOSCOPY W/ URETERAL STENT PLACEMENT  02/26/2012   Procedure: CYSTOSCOPY WITH RETROGRADE PYELOGRAM/URETERAL STENT PLACEMENT;  Surgeon: Bernestine Amass, MD;  Location: [facility];  Service: Urology;  Laterality: Left;    Family History  Problem Relation Age of Onset  . Diabetes Father   . Coronary artery disease Other   .  Heart attack Other   . Diabetes Paternal Aunt     Social History   Socioeconomic History  . Marital status: Single    Spouse name: Not on file  . Number of children: Not on file  . Years of education: Not on file  . Highest education level: Not on file  Occupational History  . Not on file  Tobacco Use  . Smoking status: Current Every Day Smoker    Types: E-cigarettes  . Smokeless tobacco: Former Network engineer and Sexual Activity  . Alcohol use: No  . Drug use: No  . Sexual activity: Not Currently  Other Topics Concern  . Not on file  Social History Narrative  . Not on file   Social Determinants of Health   Financial Resource Strain:   . Difficulty of Paying Living Expenses:   Food Insecurity:   . Worried About Charity fundraiser in the Last Year:   . Arboriculturist in the Last Year:   Transportation Needs:   . Film/video editor (Medical):   [name] Lack of Transportation (Non-Medical):   Physical Activity:   . Days of Exercise per Week:   . Minutes of Exercise per Session:   Stress:   . Feeling of Stress :   Social Connections:   . Frequency of Communication with Friends and Family:   . Frequency of Social  Gatherings with Friends and Family:   . Attends Religious Services:   . Active Member of Clubs or Organizations:   . Attends Archivist Meetings:   [name] Marital Status:   Intimate Partner Violence:   . Fear of Current or Ex-Partner:   . Emotionally Abused:   [name] Physically Abused:   . Sexually Abused:     Outpatient Medications Prior to Visit  Medication Sig Dispense Refill  . Ascorbic Acid (VITAMIN C PO) Take 1 tablet by mouth daily.    [name] aspirin EC 81 MG tablet Take 81 mg by mouth daily.    [name] gemfibrozil (LOPID) 600 MG tablet Take 1 tablet (600 mg total) by mouth 2 (two) times daily before a meal. 180 tablet 1  . Insulin Pen Needle (TRUEPLUS PEN NEEDLES) 32G X 4 MM MISC USE AS DIRECTED 100 each 12  . LANTUS SOLOSTAR 100 UNIT/ML Solostar Pen  INJECT 50 UNITS INTO THE SKIN DAILY AT 10 PM. 15 mL 2  . liraglutide (VICTOZA) 18 MG/3ML SOPN INJECT 1.8MG  INTO THE SKIN ONCE DAILY 9 mL 2  . lisinopril (ZESTRIL) 5 MG tablet Take 1 tablet (5 mg total) by mouth daily. 90 tablet 3  . metFORMIN (GLUCOPHAGE) 1000 MG tablet Take 1 tablet (1,000 mg total) by mouth 2 (two) times daily with a meal. 180 tablet 1  . omega-3 acid ethyl esters (LOVAZA) 1 g capsule Take 1 capsule (1 g total) by mouth 2 (two) times daily. 180 capsule 1  . TRUEplus Lancets 28G MISC 1 each by Does not apply route 4 (four) times daily - after meals and at bedtime. 100 each 12  . glucose blood (TRUE METRIX BLOOD GLUCOSE TEST) test strip Use as instructed 100 each 12  . HYDROmorphone (DILAUDID) 2 MG tablet Take 2 tablets (4 mg total) by mouth every 4 (four) hours as needed for severe pain. (Patient not taking: Reported on 05/14/2019) 30 tablet 0  . Misc. Devices MISC Please provide patient with standard sized 52F urinary catheters for self cathing along with lubricant. (Patient not taking: Reported on 05/14/2019) 50 each prn  . morphine (MSIR) 15 MG tablet Take 1 tablet (15 mg total) by mouth every 4 (four) hours as needed for severe pain. (Patient not taking: Reported on 05/14/2019) 7 tablet 0  . ondansetron (ZOFRAN ODT) 4 MG disintegrating tablet 4mg  ODT q4 hours prn nausea/vomit (Patient not taking: Reported on 05/14/2019) 20 tablet 0  . tamsulosin (FLOMAX) 0.4 MG CAPS capsule Take 1 capsule (0.4 mg total) by mouth daily after supper. (Patient not taking: Reported on 05/14/2019) 30 capsule 0   No facility-administered medications prior to visit.    Allergies  Allergen Reactions  . Lipitor [Atorvastatin] Rash    ROS Review of Systems  Constitutional: Positive for fever. Negative for chills and fatigue.  HENT: Negative.   Eyes: Negative.   Respiratory: Negative.   Cardiovascular: Negative.   Gastrointestinal: Negative for abdominal pain, diarrhea, nausea and vomiting.    Endocrine: Negative.   Genitourinary: Positive for dysuria, frequency and urgency. Negative for discharge, penile pain, penile swelling, scrotal swelling and testicular pain.  Musculoskeletal: Negative for back pain.  Skin: Negative.   Allergic/Immunologic: Negative.   Neurological: Negative.   Hematological: Negative.   Psychiatric/Behavioral: Negative.       Objective:    Physical Exam  Constitutional: He is oriented to person, place, and time. He appears well-developed and well-nourished. No distress.  HENT:  Head: Normocephalic and atraumatic.  Right Ear:  External ear normal.  Left Ear: External ear normal.  Nose: Nose normal.  Mouth/Throat: Oropharynx is clear and moist.  Eyes: Pupils are equal, round, and reactive to light. Conjunctivae and EOM are normal.  Cardiovascular: Normal rate, regular rhythm and normal heart sounds.  Pulmonary/Chest: Effort normal and breath sounds normal.  Abdominal: Soft. Bowel sounds are normal. There is no abdominal tenderness. There is no CVA tenderness.  Musculoskeletal:        General: Normal range of motion.     Cervical back: Normal range of motion and neck supple.  Neurological: He is alert and oriented to person, place, and time.  Skin: Skin is warm and dry. He is not diaphoretic.  Psychiatric: He has a normal mood and affect. His behavior is normal. Judgment and thought content normal.    BP 111/75 (BP Location: Right Arm, Patient Position: Sitting, Cuff Size: Large)   Pulse (!) 109   Temp 98 F (36.7 C)   Resp 18   Ht 6' (1.829 m)   Wt 250 lb (113.4 kg)   SpO2 96%   BMI 33.91 kg/m  Wt Readings from Last 3 Encounters:  05/14/19 250 lb (113.4 kg)  11/05/18 250 lb (113.4 kg)  07/10/18 243 lb 3.2 oz (110.3 kg)     Health Maintenance Due  Topic Date Due  . OPHTHALMOLOGY EXAM  Never done  . COLONOSCOPY  Never done  . FOOT EXAM  12/01/2017    There are no preventive care reminders to display for this patient.  No  results found for: TSH Lab Results  Component Value Date   WBC 9.1 02/19/2019   HGB 10.7 (L) 02/19/2019   HCT 32.7 (L) 02/19/2019   MCV 98.2 02/19/2019   PLT 168 02/19/2019   Lab Results  Component Value Date   NA 131 (L) 02/19/2019   K 5.2 (H) 02/19/2019   CO2 21 (L) 02/19/2019   GLUCOSE 126 (H) 02/19/2019   BUN 21 (H) 02/19/2019   CREATININE 1.17 02/19/2019   BILITOT 0.9 01/03/2019   ALKPHOS 99 01/03/2019   AST 26 01/03/2019   ALT 39 01/03/2019   PROT 8.2 (H) 01/03/2019   ALBUMIN 4.3 01/03/2019   CALCIUM 9.0 02/19/2019   ANIONGAP 11 02/19/2019   Lab Results  Component Value Date   CHOL 151 11/05/2018   Lab Results  Component Value Date   HDL 33 (L) 11/05/2018   Lab Results  Component Value Date   LDLCALC 101 (H) 11/05/2018   Lab Results  Component Value Date   TRIG 91 11/05/2018   Lab Results  Component Value Date   CHOLHDL 4.6 11/05/2018   Lab Results  Component Value Date   HGBA1C 8.2 (A) 05/14/2019      Assessment & Plan:   Problem List Items Addressed This Visit      Other   Self-catheterizes urinary bladder (Chronic)   Relevant Orders   Urine Culture    Other Visit Diagnoses    Uncontrolled type 2 diabetes mellitus with hyperglycemia, with long-term current use of insulin (HCC)    -  Primary   Relevant Medications   insulin aspart (novoLOG) injection 10 Units (Completed)   Other Relevant Orders   POCT URINALYSIS DIP (CLINITEK) (Completed)   POCT glucose (manual entry) (Completed)   HgB A1c (Completed)   Cystitis       Relevant Medications   sulfamethoxazole-trimethoprim (BACTRIM DS) 800-160 MG tablet   Other Relevant Orders   Urine Culture   Hyperglycemia  Relevant Medications   insulin aspart (novoLOG) injection 10 Units (Completed)      Meds ordered this encounter  Medications  . sulfamethoxazole-trimethoprim (BACTRIM DS) 800-160 MG tablet    Sig: Take 1 tablet by mouth 2 (two) times daily for 10 days.    Dispense:  20  tablet    Refill:  0    Order Specific Question:   Supervising Provider    Answer:   Asencion Noble E [[id number]]  . insulin aspart (novoLOG) injection 10 Units  1. Uncontrolled type 2 diabetes mellitus with hyperglycemia, with long-term current use of insulin (HCC) Patient's hemoglobin A1c was 8.2, went over patient's medications and current dosing with patient, patient encouraged to follow-up with primary care provider, appointment made on checkout - POCT URINALYSIS DIP (CLINITEK) - POCT glucose (manual entry) - HgB A1c  2. Self-catheterizes urinary bladder  - Urine Culture  3. Cystitis Encouraged patient to work on improving blood glucose, sterile catheterization environment, increase hydration, previous culture was sensitive to Bactrim - sulfamethoxazole-trimethoprim (BACTRIM DS) 800-160 MG tablet; Take 1 tablet by mouth 2 (two) times daily for 10 days.  Dispense: 20 tablet; Refill: 0 - Urine Culture  4. Hyperglycemia Encourage patient to work on better controlling his blood glucose, lower sugar diet, follow-up in 4 weeks with primary care provider for diabetes management. - insulin aspart (novoLOG) injection 10 Units  I have reviewed the patient's medical history (PMH, PSH, Social History, Family History, Medications, and allergies) , and have been updated if relevant. I spent 30 minutes reviewing chart and  face to face time with patient.       Follow-up: Return in about 4 weeks (around 06/11/2019) for with Zelda Felming for DM mgmt.    Loraine Grip Mayers, PA-C

## 2019-05-14 NOTE — Progress Notes (Signed)
c /o burning on urination X 4 days  Denies any fevers   CBG 333 A1c 8.2  No distress noted/

## 2019-05-16 LAB — URINE CULTURE

## 2019-05-17 ENCOUNTER — Other Ambulatory Visit: Payer: Self-pay | Admitting: Physician Assistant

## 2019-05-17 DIAGNOSIS — N390 Urinary tract infection, site not specified: Secondary | ICD-10-CM

## 2019-05-17 MED ORDER — AMOXICILLIN-POT CLAVULANATE 875-125 MG PO TABS: 1.0000 | ORAL_TABLET | Freq: Two times a day (BID) | ORAL | 0 refills | Status: DC

## 2019-05-17 MED FILL — AMOX-CLAV 875-125 MG TABLET: 875-125 | 10 days supply | Qty: 20 | Fill #0

## 2019-05-18 ENCOUNTER — Telehealth: Payer: Self-pay | Admitting: *Deleted

## 2019-05-18 NOTE — Telephone Encounter (Signed)
-----   Message from Kennieth Rad, [place] sent at 05/17/2019  6:55 AM EDT ----- Please call patient and let him know culture did show that Bactrim would not be effective, I have sent Amoxicillin to his pharmacy, he shold stop the bactrim and start the other abx.  Thanks!

## 2019-05-18 NOTE — Telephone Encounter (Signed)
Patient verified DOB Patient is aware of needing to stop bactrim and to pick up amoxicillin and begin.

## 2019-05-31 MED FILL — AMOX-CLAV 875-125 MG TABLET: 875-125 | 4 days supply | Qty: 14 | Fill #0

## 2019-06-05 MED FILL — ?OMEGA-3 ETHYL ESTERS 1 GM: 1 | 30 days supply | Qty: 60 | Fill #3

## 2019-06-05 MED FILL — GEMFIBROZIL 600 MG TAB: 600 | 30 days supply | Qty: 60 | Fill #0

## 2019-06-05 MED FILL — LISINOPRIL 5 MG TABLET: 5 | 30 days supply | Qty: 30 | Fill #1

## 2019-06-19 ENCOUNTER — Inpatient Hospital Stay (HOSPITAL_COMMUNITY)
Admission: EM | Admit: 2019-06-19 | Discharge: 2019-06-25 | DRG: 853 | Disposition: A | Discharge status: Home / Self Care | Payer: Self-pay | Attending: Internal Medicine | Admitting: Internal Medicine

## 2019-06-19 ENCOUNTER — Emergency Department (HOSPITAL_COMMUNITY): Payer: Self-pay

## 2019-06-19 ENCOUNTER — Other Ambulatory Visit: Payer: Self-pay

## 2019-06-19 DIAGNOSIS — R7881 Bacteremia: Secondary | ICD-10-CM | POA: Diagnosis present

## 2019-06-19 DIAGNOSIS — A419 Sepsis, unspecified organism: Secondary | ICD-10-CM | POA: Diagnosis present

## 2019-06-19 DIAGNOSIS — E1169 Type 2 diabetes mellitus with other specified complication: Secondary | ICD-10-CM | POA: Diagnosis present

## 2019-06-19 DIAGNOSIS — E875 Hyperkalemia: Secondary | ICD-10-CM

## 2019-06-19 DIAGNOSIS — E872 Acidosis: Secondary | ICD-10-CM | POA: Diagnosis present

## 2019-06-19 DIAGNOSIS — E1159 Type 2 diabetes mellitus with other circulatory complications: Secondary | ICD-10-CM | POA: Diagnosis present

## 2019-06-19 DIAGNOSIS — Z833 Family history of diabetes mellitus: Secondary | ICD-10-CM

## 2019-06-19 DIAGNOSIS — S80212A Abrasion, left knee, initial encounter: Secondary | ICD-10-CM | POA: Diagnosis present

## 2019-06-19 DIAGNOSIS — N136 Pyonephrosis: Secondary | ICD-10-CM | POA: Diagnosis present

## 2019-06-19 DIAGNOSIS — Z79899 Other long term (current) drug therapy: Secondary | ICD-10-CM

## 2019-06-19 DIAGNOSIS — Y92002 Bathroom of unspecified non-institutional (private) residence single-family (private) house as the place of occurrence of the external cause: Secondary | ICD-10-CM

## 2019-06-19 DIAGNOSIS — E860 Dehydration: Secondary | ICD-10-CM | POA: Diagnosis present

## 2019-06-19 DIAGNOSIS — Z794 Long term (current) use of insulin: Secondary | ICD-10-CM

## 2019-06-19 DIAGNOSIS — Z955 Presence of coronary angioplasty implant and graft: Secondary | ICD-10-CM

## 2019-06-19 DIAGNOSIS — B961 Klebsiella pneumoniae [K. pneumoniae] as the cause of diseases classified elsewhere: Secondary | ICD-10-CM | POA: Diagnosis present

## 2019-06-19 DIAGNOSIS — S3992XS Unspecified injury of lower back, sequela: Secondary | ICD-10-CM

## 2019-06-19 DIAGNOSIS — R918 Other nonspecific abnormal finding of lung field: Secondary | ICD-10-CM | POA: Diagnosis present

## 2019-06-19 DIAGNOSIS — N132 Hydronephrosis with renal and ureteral calculous obstruction: Secondary | ICD-10-CM | POA: Diagnosis present

## 2019-06-19 DIAGNOSIS — N179 Acute kidney failure, unspecified: Secondary | ICD-10-CM

## 2019-06-19 DIAGNOSIS — D696 Thrombocytopenia, unspecified: Secondary | ICD-10-CM

## 2019-06-19 DIAGNOSIS — R6520 Severe sepsis without septic shock: Secondary | ICD-10-CM | POA: Diagnosis present

## 2019-06-19 DIAGNOSIS — Z20822 Contact with and (suspected) exposure to covid-19: Secondary | ICD-10-CM | POA: Diagnosis present

## 2019-06-19 DIAGNOSIS — N319 Neuromuscular dysfunction of bladder, unspecified: Secondary | ICD-10-CM | POA: Diagnosis present

## 2019-06-19 DIAGNOSIS — R296 Repeated falls: Secondary | ICD-10-CM | POA: Diagnosis present

## 2019-06-19 DIAGNOSIS — E781 Pure hyperglyceridemia: Secondary | ICD-10-CM | POA: Diagnosis present

## 2019-06-19 DIAGNOSIS — E43 Unspecified severe protein-calorie malnutrition: Secondary | ICD-10-CM | POA: Diagnosis present

## 2019-06-19 DIAGNOSIS — I252 Old myocardial infarction: Secondary | ICD-10-CM

## 2019-06-19 DIAGNOSIS — Z789 Other specified health status: Secondary | ICD-10-CM | POA: Diagnosis present

## 2019-06-19 DIAGNOSIS — E1165 Type 2 diabetes mellitus with hyperglycemia: Secondary | ICD-10-CM | POA: Diagnosis present

## 2019-06-19 DIAGNOSIS — N400 Enlarged prostate without lower urinary tract symptoms: Secondary | ICD-10-CM | POA: Diagnosis present

## 2019-06-19 DIAGNOSIS — R0902 Hypoxemia: Secondary | ICD-10-CM

## 2019-06-19 DIAGNOSIS — I10 Essential (primary) hypertension: Secondary | ICD-10-CM | POA: Diagnosis present

## 2019-06-19 DIAGNOSIS — J9690 Respiratory failure, unspecified, unspecified whether with hypoxia or hypercapnia: Secondary | ICD-10-CM

## 2019-06-19 DIAGNOSIS — E782 Mixed hyperlipidemia: Secondary | ICD-10-CM | POA: Diagnosis present

## 2019-06-19 DIAGNOSIS — E785 Hyperlipidemia, unspecified: Secondary | ICD-10-CM | POA: Diagnosis present

## 2019-06-19 DIAGNOSIS — D649 Anemia, unspecified: Secondary | ICD-10-CM

## 2019-06-19 DIAGNOSIS — Z7982 Long term (current) use of aspirin: Secondary | ICD-10-CM

## 2019-06-19 DIAGNOSIS — S80211A Abrasion, right knee, initial encounter: Secondary | ICD-10-CM | POA: Diagnosis present

## 2019-06-19 DIAGNOSIS — A4159 Other Gram-negative sepsis: Principal | ICD-10-CM | POA: Diagnosis present

## 2019-06-19 DIAGNOSIS — F17290 Nicotine dependence, other tobacco product, uncomplicated: Secondary | ICD-10-CM | POA: Diagnosis present

## 2019-06-19 DIAGNOSIS — Z6832 Body mass index (BMI) 32.0-32.9, adult: Secondary | ICD-10-CM

## 2019-06-19 DIAGNOSIS — I2510 Atherosclerotic heart disease of native coronary artery without angina pectoris: Secondary | ICD-10-CM | POA: Diagnosis present

## 2019-06-19 DIAGNOSIS — W1830XA Fall on same level, unspecified, initial encounter: Secondary | ICD-10-CM | POA: Diagnosis present

## 2019-06-19 DIAGNOSIS — E876 Hypokalemia: Secondary | ICD-10-CM | POA: Diagnosis present

## 2019-06-19 HISTORY — DX: Anemia, unspecified: D64.9

## 2019-06-19 LAB — URINALYSIS, ROUTINE W REFLEX MICROSCOPIC
Bilirubin Urine: NEGATIVE
Glucose, UA: 50 mg/dL — AB
Ketones, ur: 5 mg/dL — AB
Nitrite: NEGATIVE
Protein, ur: 100 mg/dL — AB
Specific Gravity, Urine: 1.015 (ref 1.005–1.030)
WBC, UA: >50 WBC/hpf — ABNORMAL HIGH (ref 0–5)
pH: 5 (ref 5.0–8.0)

## 2019-06-19 LAB — RESPIRATORY PANEL BY RT PCR (FLU A&B, COVID)
Influenza A by PCR: NEGATIVE
Influenza B by PCR: NEGATIVE
SARS Coronavirus 2 by RT PCR: NEGATIVE

## 2019-06-19 LAB — CBC WITH DIFFERENTIAL/PLATELET
Abs Immature Granulocytes: 0.28 10*3/uL — ABNORMAL HIGH (ref 0.00–0.07)
Basophils Absolute: 0 10*3/uL (ref 0.0–0.1)
Basophils Relative: 0 %
Eosinophils Absolute: 0 10*3/uL (ref 0.0–0.5)
Eosinophils Relative: 0 %
HCT: 36.4 % — ABNORMAL LOW (ref 39.0–52.0)
Hemoglobin: 12.3 g/dL — ABNORMAL LOW (ref 13.0–17.0)
Immature Granulocytes: 2 %
Lymphocytes Relative: 5 %
Lymphs Abs: 0.6 10*3/uL — ABNORMAL LOW (ref 0.7–4.0)
MCH: 32.5 pg (ref 26.0–34.0)
MCHC: 33.8 g/dL (ref 30.0–36.0)
MCV: 96.3 fL (ref 80.0–100.0)
Monocytes Absolute: 1.1 10*3/uL — ABNORMAL HIGH (ref 0.1–1.0)
Monocytes Relative: 9 %
Neutro Abs: 10.3 10*3/uL — ABNORMAL HIGH (ref 1.7–7.7)
Neutrophils Relative %: 84 %
Platelets: 163 10*3/uL (ref 150–400)
RBC: 3.78 MIL/uL — ABNORMAL LOW (ref 4.22–5.81)
RDW: 13.4 % (ref 11.5–15.5)
WBC: 12.2 10*3/uL — ABNORMAL HIGH (ref 4.0–10.5)
nRBC: 0 % (ref 0.0–0.2)

## 2019-06-19 LAB — LACTIC ACID, PLASMA
Lactic Acid, Venous: 2.1 mmol/L (ref 0.5–1.9)
Lactic Acid, Venous: 1.9 mmol/L (ref 0.5–1.9)

## 2019-06-19 LAB — COMPREHENSIVE METABOLIC PANEL
ALT: 31 U/L (ref 0–44)
AST: 49 U/L — ABNORMAL HIGH (ref 15–41)
Albumin: 3.4 g/dL — ABNORMAL LOW (ref 3.5–5.0)
Alkaline Phosphatase: 48 U/L (ref 38–126)
Anion gap: 13 (ref 5–15)
BUN: 56 mg/dL — ABNORMAL HIGH (ref 6–20)
CO2: 17 mmol/L — ABNORMAL LOW (ref 22–32)
Calcium: 8.6 mg/dL — ABNORMAL LOW (ref 8.9–10.3)
Chloride: 95 mmol/L — ABNORMAL LOW (ref 98–111)
Creatinine, Ser: 2.79 mg/dL — ABNORMAL HIGH (ref 0.61–1.24)
GFR calc Af Amer: 28 mL/min — ABNORMAL LOW (ref >60)
GFR calc non Af Amer: 24 mL/min — ABNORMAL LOW (ref >60)
Glucose, Bld: 409 mg/dL — ABNORMAL HIGH (ref 70–99)
Potassium: 5.7 mmol/L — ABNORMAL HIGH (ref 3.5–5.1)
Sodium: 125 mmol/L — ABNORMAL LOW (ref 135–145)
Total Bilirubin: 2.5 mg/dL — ABNORMAL HIGH (ref 0.3–1.2)
Total Protein: 7 g/dL (ref 6.5–8.1)

## 2019-06-19 LAB — PROTIME-INR
INR: 2.2 — ABNORMAL HIGH (ref 0.8–1.2)
Prothrombin Time: 23.8 seconds — ABNORMAL HIGH (ref 11.4–15.2)

## 2019-06-19 LAB — APTT: aPTT: 37 seconds — ABNORMAL HIGH (ref 24–36)

## 2019-06-19 MED ORDER — SODIUM CHLORIDE 0.9 % IV BOLUS (SEPSIS)
1000.0000 mL | Freq: Once | INTRAVENOUS | Status: AC
  Administered 2019-06-19: 21:00:00 1000 mL via INTRAVENOUS

## 2019-06-19 MED ORDER — SODIUM CHLORIDE 0.9 % IV SOLN
2.0000 g | INTRAVENOUS | Status: DC
  Administered 2019-06-19 – 2019-06-21 (×3): 2 g via INTRAVENOUS
  Filled 2019-06-19: qty 2
  Filled 2019-06-19: qty 20
  Filled 2019-06-19: qty 2

## 2019-06-19 MED ORDER — SODIUM CHLORIDE 0.9 % IV BOLUS (SEPSIS)
1000.0000 mL | Freq: Once | INTRAVENOUS | Status: AC
  Administered 2019-06-19: 22:00:00 1000 mL via INTRAVENOUS

## 2019-06-19 MED ORDER — SODIUM CHLORIDE 0.9 % IV BOLUS (SEPSIS)
500.0000 mL | Freq: Once | INTRAVENOUS | Status: AC
  Administered 2019-06-19: 22:00:00 500 mL via INTRAVENOUS

## 2019-06-19 NOTE — ED Triage Notes (Signed)
Pt sts his weakness and slurred speech started noon yesterday.

## 2019-06-19 NOTE — H&P (Signed)
History and Physical    Shane Bond [id number] DOB: 15-Oct-1960 DOA: 06/19/2019  PCP: Gildardo Pounds, NP  Patient coming from: Home  I have personally briefly reviewed patient's old medical records in [place]  Chief Complaint: Generalized weakness  HPI: Shane Bond is a 59 y.o. male with medical history significant for CAD, insulin-dependent type 2 diabetes, hypertension, hyperlipidemia, prior back injury with neurogenic bladder and self catheterizes who presents to the ED for evaluation of generalized weakness.  Patient states he has been having generalized weakness beginning about 3 days ago.  He is also been experiencing fevers, chills, diaphoresis at home.  He has a history of neurogenic bladder and normally self catheterizes.  He says he has been having difficulty with self catheterizing recently which he believes is due to prostate issues.  He has therefore had decreased urine output for at least a few days.  He reports a chronic cough which is unchanged from his baseline.  He otherwise denies any chest pain, dyspnea, nausea, vomiting, abdominal pain.  He reports 1 episode of diarrhea this morning.  He says he also fell at home in the bathroom resulting in abrasions to his knees.  He states he is scheduled to follow-up with his urologist next week for a biopsy of his prostate due to reported prostate lesion.  ED Course:  In the ED, initial vitals showed BP 104/69, pulse 126, RR 22, temp 100.4 Fahrenheit, SPO2 91% on room air.  Labs are notable for BUN 56, creatinine 2.79 (1.17 on 02/19/2019), potassium 5.7, bicarb 17, sodium 125 (132 when corrected for hyperglycemia), serum glucose 409, anion gap 13, WBC 12.2, hemoglobin 12.3, platelets 163,000, lactic acid 2.1.  Urinalysis showed negative nitrites, large leukocytes, 11-20 RBC/hpf, greater than 50 WBC/hpf, many bacteria on microscopy.  Blood and urine cultures were obtained and pending.  SARS-CoV-2 PCR and influenza  A/B PCR's are negative.  Portable chest x-ray showed patchy bibasilar opacities.  Patient was treated as sepsis protocol and given 3.5 L normal saline and started on IV ceftriaxone.  Repeat lactic acid is 1.9.  The hospitalist service was consulted to admit for further evaluation and management.  Review of Systems: All systems reviewed and are negative except as documented in history of present illness above.   Past Medical History:  Diagnosis Date  . 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 Bladder atonia   . BPH (benign prostatic hyperplasia)   . CAD, NATIVE VESSEL 10/29/2009  . Diabetes mellitus type 2, uncontrolled, with complications ([place])   . Hyperlipidemia LDL goal <70   . Kidney stone   . OBESITY 10/22/2009    Past Surgical History:  Procedure Laterality Date  . BACK SURGERY    . CORONARY STENT PLACEMENT    . CYSTOSCOPY W/ URETERAL STENT PLACEMENT  02/26/2012   Procedure: CYSTOSCOPY WITH RETROGRADE PYELOGRAM/URETERAL STENT PLACEMENT;  Surgeon: Bernestine Amass, MD;  Location: [facility];  Service: Urology;  Laterality: Left;    Social History:  reports that he has been smoking e-cigarettes. He has quit using smokeless tobacco. He reports that he does not drink alcohol or use drugs.  Allergies  Allergen Reactions  . Lipitor [Atorvastatin] Rash    Family History  Problem Relation Age of Onset  . Diabetes Father   . Coronary artery disease Other   . Heart attack Other   . Diabetes Paternal Aunt      Prior to Admission medications   Medication Sig Start Date End Date Taking?  Authorizing Provider  amoxicillin-clavulanate (AUGMENTIN) 875-125 MG tablet Take 1 tablet by mouth 2 (two) times daily. 05/17/19  Yes Mayers, Cari S, PA-C  Ascorbic Acid (VITAMIN C PO) Take 1 tablet by mouth daily.   Yes [provider]  aspirin EC 81 MG tablet Take 81 mg by mouth daily.   Yes [provider]  gemfibrozil (LOPID) 600 MG tablet Take 1 tablet (600 mg total) by mouth 2 (two) times  daily before a meal. 02/24/19 06/19/19 Yes Gildardo Pounds, NP  LANTUS SOLOSTAR 100 UNIT/ML Solostar Pen INJECT 50 UNITS INTO THE SKIN DAILY AT 10 PM. Patient taking differently: Inject 50 Units into the skin at bedtime.  04/05/19  Yes Newlin, Charlane Ferretti, MD  liraglutide (VICTOZA) 18 MG/3ML SOPN INJECT 1.8MG  INTO THE SKIN ONCE DAILY 04/05/19  Yes Newlin, Enobong, MD  lisinopril (ZESTRIL) 5 MG tablet Take 1 tablet (5 mg total) by mouth daily. 11/05/18  Yes Gildardo Pounds, NP  metFORMIN (GLUCOPHAGE) 1000 MG tablet Take 1 tablet (1,000 mg total) by mouth 2 (two) times daily with a meal. 11/05/18  Yes Gildardo Pounds, NP  omega-3 acid ethyl esters (LOVAZA) 1 g capsule Take 1 capsule (1 g total) by mouth 2 (two) times daily. 02/24/19 06/19/19 Yes Gildardo Pounds, NP  glucose blood (TRUE METRIX BLOOD GLUCOSE TEST) test strip Use as instructed 11/05/18   Gildardo Pounds, NP  HYDROmorphone (DILAUDID) 2 MG tablet Take 2 tablets (4 mg total) by mouth every 4 (four) hours as needed for severe pain. Patient not taking: Reported on 05/14/2019 02/14/19   Molpus, John, MD  Insulin Pen Needle (TRUEPLUS PEN NEEDLES) 32G X 4 MM MISC USE AS DIRECTED 11/05/18   Gildardo Pounds, NP  Misc. Devices MISC Please provide patient with standard sized 40F urinary catheters for self cathing along with lubricant. Patient not taking: Reported on 05/14/2019 05/29/18   Gildardo Pounds, NP  morphine (MSIR) 15 MG tablet Take 1 tablet (15 mg total) by mouth every 4 (four) hours as needed for severe pain. Patient not taking: Reported on 05/14/2019 02/19/19   Deno Etienne, DO  ondansetron (ZOFRAN ODT) 4 MG disintegrating tablet 4mg  ODT q4 hours prn nausea/vomit Patient not taking: Reported on 05/14/2019 02/19/19   Deno Etienne, DO  tamsulosin (FLOMAX) 0.4 MG CAPS capsule Take 1 capsule (0.4 mg total) by mouth daily after supper. Patient not taking: Reported on 05/14/2019 02/19/19   Deno Etienne, DO  TRUEplus Lancets 28G MISC 1 each by Does not apply route  4 (four) times daily - after meals and at bedtime. 11/05/18   Gildardo Pounds, NP    Physical Exam: Vitals:   06/19/19 2130 06/19/19 2145 06/19/19 2230 06/19/19 2330  BP: 125/74 101/81 107/70 119/82  Pulse: (!) 117 (!) 115 (!) 113 (!) 113  Resp: (!) 37 (!) 27 (!) 23 (!) 27  Temp:      TempSrc:      SpO2: 94% 93% 98% 99%  Weight:      Height:       Constitutional: Obese man resting supine in bed, NAD, calm, appears tired but comfortable Eyes: PERRL, lids and conjunctivae normal ENMT: Mucous membranes are moist. Posterior pharynx clear of any exudate or lesions. Neck: normal, supple, no masses. Respiratory: clear to auscultation bilaterally, no wheezing, no crackles. Normal respiratory effort. No accessory muscle use.  Cardiovascular: Tachycardic, no murmurs / rubs / gallops. No extremity edema. 2+ pedal pulses. Abdomen: no tenderness, no masses palpated. No  hepatosplenomegaly. Bowel sounds positive.  Musculoskeletal: no clubbing / cyanosis. No joint deformity upper and lower extremities. Good ROM, no contractures. Normal muscle tone.  Skin: Superficial skin abrasion both knees.  No induration Neurologic: CN 2-12 grossly intact. Sensation intact, Strength 5/5 in all 4.  Psychiatric: Normal judgment and insight.  Fatigued but awake, alert and oriented x 3. Normal mood.   Labs on Admission: I have personally reviewed following labs and imaging studies  CBC: Recent Labs  Lab 06/19/19 2110  WBC 12.2*  NEUTROABS 10.3*  HGB 12.3*  HCT 36.4*  MCV 96.3  PLT [id number]   Basic Metabolic Panel: Recent Labs  Lab 06/19/19 2110  NA 125*  K 5.7*  CL 95*  CO2 17*  GLUCOSE 409*  BUN 56*  CREATININE 2.79*  CALCIUM 8.6*   GFR: Estimated Creatinine Clearance: 36.8 mL/min (A) (by C-G formula based on SCr of 2.79 mg/dL (H)). Liver Function Tests: Recent Labs  Lab 06/19/19 2110  AST 49*  ALT 31  ALKPHOS 48  BILITOT 2.5*  PROT 7.0  ALBUMIN 3.4*   No results for input(s): LIPASE,  AMYLASE in the last 168 hours. No results for input(s): AMMONIA in the last 168 hours. Coagulation Profile: Recent Labs  Lab 06/19/19 2200  INR 2.2*   Cardiac Enzymes: No results for input(s): CKTOTAL, CKMB, CKMBINDEX, TROPONINI in the last 168 hours. BNP (last 3 results) No results for input(s): PROBNP in the last 8760 hours. HbA1C: No results for input(s): HGBA1C in the last 72 hours. CBG: No results for input(s): GLUCAP in the last 168 hours. Lipid Profile: No results for input(s): CHOL, HDL, LDLCALC, TRIG, CHOLHDL, LDLDIRECT in the last 72 hours. Thyroid Function Tests: No results for input(s): TSH, T4TOTAL, FREET4, T3FREE, THYROIDAB in the last 72 hours. Anemia Panel: No results for input(s): VITAMINB12, FOLATE, FERRITIN, TIBC, IRON, RETICCTPCT in the last 72 hours. Urine analysis:    Component Value Date/Time   COLORURINE AMBER (A) 06/19/2019 2221   APPEARANCEUR CLOUDY (A) 06/19/2019 2221   APPEARANCEUR Cloudy (A) 11/05/2018 1559   LABSPEC 1.015 06/19/2019 2221   PHURINE 5.0 06/19/2019 2221   GLUCOSEU 50 (A) 06/19/2019 2221   HGBUR MODERATE (A) 06/19/2019 2221   BILIRUBINUR NEGATIVE 06/19/2019 2221   BILIRUBINUR negative 05/14/2019 1114   BILIRUBINUR Negative 11/05/2018 1559   KETONESUR 5 (A) 06/19/2019 2221   PROTEINUR 100 (A) 06/19/2019 2221   UROBILINOGEN 0.2 05/14/2019 1114   UROBILINOGEN 0.2 11/02/2017 1839   NITRITE NEGATIVE 06/19/2019 2221   LEUKOCYTESUR LARGE (A) 06/19/2019 2221    Radiological Exams on Admission: DG Chest Port 1 View  Result Date: 06/19/2019 CLINICAL DATA:  Weakness and fevers EXAM: PORTABLE CHEST 1 VIEW COMPARISON:  01/03/2019 FINDINGS: Cardiac shadow is within normal limits. Tortuous thoracic aorta is again seen. Patchy bibasilar atelectasis/infiltrate is noted. No sizable effusion is seen. No bony abnormality is noted. IMPRESSION: Patchy bibasilar opacities consistent with atelectasis/early infiltrate. Electronically Signed   By: Inez Catalina M.D.   On: 06/19/2019 21:31    EKG: Independently reviewed. Sinus tachycardia.  Assessment/Plan Principal Problem:   Sepsis due to urinary tract infection ([place]) Active Problems:   CAD, NATIVE VESSEL   Hypertension associated with diabetes ([place])   Hyperlipidemia associated with type 2 diabetes mellitus (HCC)   BPH (benign prostatic hyperplasia)   Self-catheterizes urinary bladder   Type 2 diabetes mellitus with hyperglycemia, with long-term current use of insulin (HCC)   Hyperkalemia   Acute kidney injury ([place])  Shane Bond  is a 59 y.o. male with medical history significant for CAD, insulin-dependent type 2 diabetes, hypertension, hyperlipidemia, prior back injury with neurogenic bladder and self catheterizes who is admitted with sepsis due to UTI with associated AKI.  Sepsis due to UTI in setting of BPH/neurogenic bladder and self-catheterization: -Continue empiric IV ceftriaxone -Follow blood and urine cultures -Continue IV fluid hydration overnight -Monitor strict I/O's, bladder scan and in and out cath as needed -Restart tamsulosin 0.4 mg  Acute kidney injury due to sepsis: With possible contribution from bladder outlet obstruction per history. -Obtain renal ultrasound -Gentle IV fluid hydration overnight -Hold home lisinopril and avoid NSAIDs  Hyperkalemia: K 5.7 on admission.  Will give Lokelma once and continue gentle IV normal saline as above.  Hold lisinopril.  Hyperglycemia in insulin-dependent type 2 diabetes: Without evidence of DKA.  Give 5 units NovoLog once now.  Resume home Lantus 50 units nightly and add sensitive SSI with HS coverage.  Holding home Metformin and Victoza.  CAD: Denies any chest pain.  Continue aspirin.  Hypertension: Holding home lisinopril due to hyperkalemia and initial hypotension.  Hyperlipidemia: Home Lopid on hold for now.  DVT prophylaxis: Subcutaneous heparin Code Status: Full code, confirmed with patient Family  Communication: Discussed with sister at bedside Disposition Plan: From home, likely discharge to home pending clinical progress Consults called: None Admission status:   Status is: Inpatient  Remains inpatient appropriate because:Inpatient level of care appropriate due to severity of illness   Dispo: The patient is from: Home              Anticipated d/c is to: Home              Anticipated d/c date is: 3 days              Patient currently is not medically stable to d/c.   Zada Finders MD [facility]  If 7PM-7AM, please contact night-coverage [email or web address]  06/19/2019, 11:50 PM

## 2019-06-19 NOTE — ED Notes (Addendum)
Date and time results received: 06/19/19 2154 (use smartphrase ".now" to insert current time)  Test: lactic acid Critical Value: 2.1 Name of Provider Notified:delo md Orders Received? Or Actions Taken?:waiting on orders

## 2019-06-19 NOTE — ED Provider Notes (Signed)
[facility] DEPT Provider Note   CSN: [id number] Arrival date & time: 06/19/19  1956     History Chief Complaint  Patient presents with  . Weakness    Shane Bond is a 59 y.o. male.  Patient is a 59 year old male with past medical history of diabetes on insulin, hyperlipidemia, hypertension, coronary artery disease, and prior back injury with neurogenic bladder requiring self caths at home.  He presents today for evaluation of weakness.  Patient describes difficulty with ambulation and falling several times since yesterday.  He has also been unable to perform his self caths due to weakness.  Patient arrives here with low-grade fever, tachycardia, and borderline hypotension.  The history is provided by the patient.  Weakness Severity:  Moderate Onset quality:  Sudden Duration:  2 days Timing:  Constant Progression:  Worsening Chronicity:  New Relieved by:  Nothing Worsened by:  Nothing Ineffective treatments:  None tried      Past Medical History:  Diagnosis Date  . 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 Bladder atonia   . BPH (benign prostatic hyperplasia)   . CAD, NATIVE VESSEL 10/29/2009  . Diabetes mellitus type 2, uncontrolled, with complications ([place])   . Hyperlipidemia LDL goal <70   . Kidney stone   . OBESITY 10/22/2009    Patient Active Problem List   Diagnosis Date Noted  . Type 2 diabetes mellitus with hyperglycemia, with long-term current use of insulin ([place]) 04/13/2017  . Diabetes mellitus without complication ([place]) [id number]  . Former smoker 11/02/2015  . Diabetes type 2, uncontrolled ([place]) 06/18/2015  . Hyperlipidemia 06/18/2015  . BPH (benign prostatic hyperplasia) 06/18/2015  . Self-catheterizes urinary bladder 06/18/2015  . HTN (hypertension) 07/08/2010  . CAD, NATIVE VESSEL 10/29/2009  . 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 BACK SURGERY    . CORONARY STENT PLACEMENT     . CYSTOSCOPY W/ URETERAL STENT PLACEMENT  02/26/2012   Procedure: CYSTOSCOPY WITH RETROGRADE PYELOGRAM/URETERAL STENT PLACEMENT;  Surgeon: Bernestine Amass, MD;  Location: [facility] ORS;  Service: Urology;  Laterality: Left;       Family History  Problem Relation Age of Onset  . Diabetes Father   . Coronary artery disease Other   . Heart attack Other   . Diabetes Paternal Aunt     Social History   Tobacco Use  . Smoking status: Current Every Day Smoker    Types: E-cigarettes  . Smokeless tobacco: Former Network engineer Use Topics  . Alcohol use: No  . Drug use: No    Home Medications Prior to Admission medications   Medication Sig Start Date End Date Taking? Authorizing Provider  amoxicillin-clavulanate (AUGMENTIN) 875-125 MG tablet Take 1 tablet by mouth 2 (two) times daily. 05/17/19   Mayers, Cari S, PA-C  Ascorbic Acid (VITAMIN C PO) Take 1 tablet by mouth daily.    [provider]  aspirin EC 81 MG tablet Take 81 mg by mouth daily.    [provider]  gemfibrozil (LOPID) 600 MG tablet Take 1 tablet (600 mg total) by mouth 2 (two) times daily before a meal. 02/24/19 05/25/19  Gildardo Pounds, NP  glucose blood (TRUE METRIX BLOOD GLUCOSE TEST) test strip Use as instructed 11/05/18   Gildardo Pounds, NP  HYDROmorphone (DILAUDID) 2 MG tablet Take 2 tablets (4 mg total) by mouth every 4 (four) hours as needed for severe pain. Patient not taking: Reported on 05/14/2019  02/14/19   Molpus, John, MD  Insulin Pen Needle (TRUEPLUS PEN NEEDLES) 32G X 4 MM MISC USE AS DIRECTED 11/05/18   Gildardo Pounds, NP  LANTUS SOLOSTAR 100 UNIT/ML Solostar Pen INJECT 50 UNITS INTO THE SKIN DAILY AT 10 PM. 04/05/19   Charlott Rakes, MD  liraglutide (VICTOZA) 18 MG/3ML SOPN INJECT 1.8MG  INTO THE SKIN ONCE DAILY 04/05/19   Charlott Rakes, MD  lisinopril (ZESTRIL) 5 MG tablet Take 1 tablet (5 mg total) by mouth daily. 11/05/18   Gildardo Pounds, NP  metFORMIN (GLUCOPHAGE) 1000 MG tablet Take 1  tablet (1,000 mg total) by mouth 2 (two) times daily with a meal. 11/05/18   Gildardo Pounds, NP  Misc. Devices MISC Please provide patient with standard sized 21F urinary catheters for self cathing along with lubricant. Patient not taking: Reported on 05/14/2019 05/29/18   Gildardo Pounds, NP  morphine (MSIR) 15 MG tablet Take 1 tablet (15 mg total) by mouth every 4 (four) hours as needed for severe pain. Patient not taking: Reported on 05/14/2019 02/19/19   Deno Etienne, DO  omega-3 acid ethyl esters (LOVAZA) 1 g capsule Take 1 capsule (1 g total) by mouth 2 (two) times daily. 02/24/19 05/25/19  Gildardo Pounds, NP  ondansetron (ZOFRAN ODT) 4 MG disintegrating tablet 4mg  ODT q4 hours prn nausea/vomit Patient not taking: Reported on 05/14/2019 02/19/19   Deno Etienne, DO  tamsulosin (FLOMAX) 0.4 MG CAPS capsule Take 1 capsule (0.4 mg total) by mouth daily after supper. Patient not taking: Reported on 05/14/2019 02/19/19   Deno Etienne, DO  TRUEplus Lancets 28G MISC 1 each by Does not apply route 4 (four) times daily - after meals and at bedtime. 11/05/18   Gildardo Pounds, NP    Allergies    Lipitor [atorvastatin]  Review of Systems   Review of Systems  Neurological: Positive for weakness.  All other systems reviewed and are negative.   Physical Exam Updated Vital Signs BP 104/69   Pulse (!) 135   Temp (!) 100.4 F (38 C) (Oral)   Resp (!) 22   Ht 6' (1.829 m)   Wt 108.9 kg   SpO2 91%   BMI 32.55 kg/m   Physical Exam Vitals and nursing note reviewed.  Constitutional:      General: He is not in acute distress.    Appearance: He is well-developed. He is not diaphoretic.     Comments: Patient is awake and alert.  He is somewhat pale appearing.  HENT:     Head: Normocephalic and atraumatic.     Mouth/Throat:     Mouth: Mucous membranes are moist.  Cardiovascular:     Rate and Rhythm: Regular rhythm. Tachycardia present.     Heart sounds: No murmur. No friction rub.  Pulmonary:      Effort: Pulmonary effort is normal. No respiratory distress.     Breath sounds: Normal breath sounds. No wheezing or rales.  Abdominal:     General: Bowel sounds are normal. There is no distension.     Palpations: Abdomen is soft.     Tenderness: There is no abdominal tenderness.  Musculoskeletal:        General: Normal range of motion.     Cervical back: Normal range of motion and neck supple.     Right lower leg: No edema.     Left lower leg: No edema.  Skin:    General: Skin is warm and dry.  Neurological:  General: No focal deficit present.     Mental Status: He is alert and oriented to person, place, and time.     Cranial Nerves: No cranial nerve deficit.     Motor: No weakness.     Coordination: Coordination normal.     ED Results / Procedures / Treatments   Labs (all labs ordered are listed, but only abnormal results are displayed) Labs Reviewed  CULTURE, BLOOD (ROUTINE X 2)  CULTURE, BLOOD (ROUTINE X 2)  URINE CULTURE  RESPIRATORY PANEL BY RT PCR (FLU A&B, COVID)  LACTIC ACID, PLASMA  LACTIC ACID, PLASMA  COMPREHENSIVE METABOLIC PANEL  CBC WITH DIFFERENTIAL/PLATELET  APTT  PROTIME-INR  URINALYSIS, ROUTINE W REFLEX MICROSCOPIC    EKG None  Radiology No results found.  Procedures Procedures (including critical care time)  Medications Ordered in ED Medications  sodium chloride 0.9 % bolus 1,000 mL (has no administration in time range)    And  sodium chloride 0.9 % bolus 1,000 mL (has no administration in time range)    And  sodium chloride 0.9 % bolus 1,000 mL (has no administration in time range)    And  sodium chloride 0.9 % bolus 500 mL (has no administration in time range)    ED Course  I have reviewed the triage vital signs and the nursing notes.  Pertinent labs & imaging results that were available during my care of the patient were reviewed by me and considered in my medical decision making (see chart for details).    MDM  Rules/Calculators/A&P  Patient is a 59 year old male with extensive past medical history as described in the HPI.  Patient performs self caths at home.  He presents with generalized weakness that has worsened over the past several days.  He arrives here with low-grade fever, tachycardia, and borderline hypotension.  Presentation concerning for possible septicemia.  Septic work-up was initiated including blood cultures, UA with urine culture, and chest x-ray.  Patient does have evidence for infection in his urine and possible developing infiltrate in the lung base.  He was given IV Rocephin.  Sepsis fluids were initiated with initial lactate returning at 2.1.  Patient to be admitted to the hospitalist service for further care.  CRITICAL CARE Performed by: Veryl Speak Total critical care time: 35 minutes Critical care time was exclusive of separately billable procedures and treating other patients. Critical care was necessary to treat or prevent imminent or life-threatening deterioration. Critical care was time spent personally by me on the following activities: development of treatment plan with patient and/or surrogate as well as nursing, discussions with consultants, evaluation of patient's response to treatment, examination of patient, obtaining history from patient or surrogate, ordering and performing treatments and interventions, ordering and review of laboratory studies, ordering and review of radiographic studies, pulse oximetry and re-evaluation of patient's condition.   Final Clinical Impression(s) / ED Diagnoses Final diagnoses:  None    Rx / DC Orders ED Discharge Orders    None       Veryl Speak, MD 06/19/19 2342

## 2019-06-20 ENCOUNTER — Encounter (HOSPITAL_COMMUNITY): Payer: Self-pay | Admitting: Internal Medicine

## 2019-06-20 ENCOUNTER — Inpatient Hospital Stay (HOSPITAL_COMMUNITY): Payer: Self-pay

## 2019-06-20 DIAGNOSIS — R6520 Severe sepsis without septic shock: Secondary | ICD-10-CM

## 2019-06-20 DIAGNOSIS — R918 Other nonspecific abnormal finding of lung field: Secondary | ICD-10-CM | POA: Diagnosis present

## 2019-06-20 DIAGNOSIS — E875 Hyperkalemia: Secondary | ICD-10-CM

## 2019-06-20 DIAGNOSIS — Z789 Other specified health status: Secondary | ICD-10-CM

## 2019-06-20 DIAGNOSIS — E1165 Type 2 diabetes mellitus with hyperglycemia: Secondary | ICD-10-CM

## 2019-06-20 DIAGNOSIS — N179 Acute kidney failure, unspecified: Secondary | ICD-10-CM

## 2019-06-20 DIAGNOSIS — I10 Essential (primary) hypertension: Secondary | ICD-10-CM

## 2019-06-20 DIAGNOSIS — N400 Enlarged prostate without lower urinary tract symptoms: Secondary | ICD-10-CM

## 2019-06-20 DIAGNOSIS — Z794 Long term (current) use of insulin: Secondary | ICD-10-CM

## 2019-06-20 DIAGNOSIS — E1159 Type 2 diabetes mellitus with other circulatory complications: Secondary | ICD-10-CM

## 2019-06-20 LAB — BLOOD CULTURE ID PANEL (REFLEXED)

## 2019-06-20 LAB — BASIC METABOLIC PANEL
Anion gap: 12 (ref 5–15)
BUN: 46 mg/dL — ABNORMAL HIGH (ref 6–20)
CO2: 17 mmol/L — ABNORMAL LOW (ref 22–32)
Calcium: 8.2 mg/dL — ABNORMAL LOW (ref 8.9–10.3)
Chloride: 104 mmol/L (ref 98–111)
Creatinine, Ser: 2.15 mg/dL — ABNORMAL HIGH (ref 0.61–1.24)
GFR calc Af Amer: 38 mL/min — ABNORMAL LOW (ref >60)
GFR calc non Af Amer: 33 mL/min — ABNORMAL LOW (ref >60)
Glucose, Bld: 342 mg/dL — ABNORMAL HIGH (ref 70–99)
Potassium: 3.7 mmol/L (ref 3.5–5.1)
Sodium: 133 mmol/L — ABNORMAL LOW (ref 135–145)

## 2019-06-20 LAB — URINE CULTURE

## 2019-06-20 LAB — CBC
HCT: 34.4 % — ABNORMAL LOW (ref 39.0–52.0)
Hemoglobin: 11.2 g/dL — ABNORMAL LOW (ref 13.0–17.0)
MCH: 31.6 pg (ref 26.0–34.0)
MCHC: 32.6 g/dL (ref 30.0–36.0)
MCV: 97.2 fL (ref 80.0–100.0)
Platelets: 124 10*3/uL — ABNORMAL LOW (ref 150–400)
RBC: 3.54 MIL/uL — ABNORMAL LOW (ref 4.22–5.81)
RDW: 13.2 % (ref 11.5–15.5)
WBC: 9.1 10*3/uL (ref 4.0–10.5)
nRBC: 0 % (ref 0.0–0.2)

## 2019-06-20 LAB — CBG MONITORING, ED
Glucose-Capillary: 298 mg/dL — ABNORMAL HIGH (ref 70–99)
Glucose-Capillary: 317 mg/dL — ABNORMAL HIGH (ref 70–99)
Glucose-Capillary: 317 mg/dL — ABNORMAL HIGH (ref 70–99)

## 2019-06-20 LAB — GLUCOSE, CAPILLARY
Glucose-Capillary: 284 mg/dL — ABNORMAL HIGH (ref 70–99)
Glucose-Capillary: 317 mg/dL — ABNORMAL HIGH (ref 70–99)

## 2019-06-20 LAB — HIV ANTIBODY (ROUTINE TESTING W REFLEX): HIV Screen 4th Generation wRfx: NONREACTIVE

## 2019-06-20 MED ORDER — SODIUM ZIRCONIUM CYCLOSILICATE 10 G PO PACK
10.0000 g | PACK | Freq: Once | ORAL | Status: AC
  Administered 2019-06-20: 01:00:00 10 g via ORAL
  Filled 2019-06-20: qty 1

## 2019-06-20 MED ORDER — ASPIRIN EC 81 MG PO TBEC
81.0000 mg | DELAYED_RELEASE_TABLET | Freq: Every day | ORAL | Status: DC
  Administered 2019-06-20 – 2019-06-25 (×5): 81 mg via ORAL
  Filled 2019-06-20 (×5): qty 1

## 2019-06-20 MED ORDER — ONDANSETRON HCL 4 MG/2ML IJ SOLN: 4.0000 mg | Freq: Four times a day (QID) | INTRAMUSCULAR | Status: DC | PRN

## 2019-06-20 MED ORDER — SODIUM CHLORIDE 0.9% FLUSH
3.0000 mL | Freq: Two times a day (BID) | INTRAVENOUS | Status: DC
  Administered 2019-06-20 – 2019-06-25 (×7): 3 mL via INTRAVENOUS

## 2019-06-20 MED ORDER — AZITHROMYCIN 250 MG PO TABS
500.0000 mg | ORAL_TABLET | Freq: Every day | ORAL | Status: DC
  Administered 2019-06-21 – 2019-06-24 (×4): 500 mg via ORAL
  Filled 2019-06-20 (×4): qty 2

## 2019-06-20 MED ORDER — ACETAMINOPHEN 650 MG RE SUPP
650.0000 mg | Freq: Four times a day (QID) | RECTAL | Status: DC | PRN
  Administered 2019-06-22: 650 mg via RECTAL
  Filled 2019-06-20: qty 1

## 2019-06-20 MED ORDER — INSULIN GLARGINE 100 UNIT/ML ~~LOC~~ SOLN
50.0000 [IU] | Freq: Every day | SUBCUTANEOUS | Status: DC
  Administered 2019-06-20 – 2019-06-24 (×6): 50 [IU] via SUBCUTANEOUS
  Filled 2019-06-20 (×6): qty 0.5

## 2019-06-20 MED ORDER — LIP MEDEX EX OINT: TOPICAL_OINTMENT | CUTANEOUS | Status: DC | PRN

## 2019-06-20 MED ORDER — TAMSULOSIN HCL 0.4 MG PO CAPS
0.4000 mg | ORAL_CAPSULE | Freq: Every day | ORAL | Status: DC
  Administered 2019-06-20 – 2019-06-24 (×6): 0.4 mg via ORAL
  Filled 2019-06-20 (×6): qty 1

## 2019-06-20 MED ORDER — SODIUM CHLORIDE 0.9 % IV SOLN: INTRAVENOUS | Status: DC

## 2019-06-20 MED ORDER — SODIUM CHLORIDE 0.9 % IV SOLN: INTRAVENOUS | Status: AC

## 2019-06-20 MED ORDER — HEPARIN SODIUM (PORCINE) 5000 UNIT/ML IJ SOLN
5000.0000 [IU] | Freq: Three times a day (TID) | INTRAMUSCULAR | Status: DC
  Administered 2019-06-20 – 2019-06-25 (×17): 5000 [IU] via SUBCUTANEOUS
  Filled 2019-06-20 (×16): qty 1

## 2019-06-20 MED ORDER — INSULIN ASPART 100 UNIT/ML ~~LOC~~ SOLN
0.0000 [IU] | Freq: Three times a day (TID) | SUBCUTANEOUS | Status: DC
  Administered 2019-06-20: 7 [IU] via SUBCUTANEOUS
  Administered 2019-06-20 (×2): 5 [IU] via SUBCUTANEOUS
  Administered 2019-06-21: 3 [IU] via SUBCUTANEOUS
  Administered 2019-06-21: 12:00:00 5 [IU] via SUBCUTANEOUS
  Administered 2019-06-21: 3 [IU] via SUBCUTANEOUS
  Administered 2019-06-22: 5 [IU] via SUBCUTANEOUS
  Administered 2019-06-22 (×2): 3 [IU] via SUBCUTANEOUS
  Administered 2019-06-23: 2 [IU] via SUBCUTANEOUS
  Administered 2019-06-23: 7 [IU] via SUBCUTANEOUS
  Administered 2019-06-23: 09:00:00 3 [IU] via SUBCUTANEOUS
  Administered 2019-06-24 – 2019-06-25 (×3): 2 [IU] via SUBCUTANEOUS
  Administered 2019-06-25: 5 [IU] via SUBCUTANEOUS
  Filled 2019-06-20: qty 0.09

## 2019-06-20 MED ORDER — INSULIN ASPART 100 UNIT/ML ~~LOC~~ SOLN
5.0000 [IU] | Freq: Once | SUBCUTANEOUS | Status: AC
  Administered 2019-06-20: 5 [IU] via SUBCUTANEOUS
  Filled 2019-06-20: qty 0.05

## 2019-06-20 MED ORDER — CHLORHEXIDINE GLUCONATE CLOTH 2 % EX PADS
6.0000 | MEDICATED_PAD | Freq: Every day | CUTANEOUS | Status: DC
  Administered 2019-06-20 – 2019-06-24 (×5): 6 via TOPICAL

## 2019-06-20 MED ORDER — ONDANSETRON HCL 4 MG PO TABS: 4.0000 mg | ORAL_TABLET | Freq: Four times a day (QID) | ORAL | Status: DC | PRN

## 2019-06-20 MED ORDER — INSULIN ASPART 100 UNIT/ML ~~LOC~~ SOLN
0.0000 [IU] | Freq: Every day | SUBCUTANEOUS | Status: DC
  Administered 2019-06-20 (×2): 4 [IU] via SUBCUTANEOUS
  Administered 2019-06-21 – 2019-06-23 (×2): 2 [IU] via SUBCUTANEOUS
  Administered 2019-06-24: 5 [IU] via SUBCUTANEOUS
  Filled 2019-06-20: qty 0.05

## 2019-06-20 MED ORDER — SODIUM CHLORIDE 0.9 % IV BOLUS
1000.0000 mL | Freq: Once | INTRAVENOUS | Status: AC
  Administered 2019-06-20: 17:00:00 1000 mL via INTRAVENOUS

## 2019-06-20 MED ORDER — SODIUM CHLORIDE 0.9 % IV SOLN
500.0000 mg | INTRAVENOUS | Status: DC
  Administered 2019-06-20: 10:00:00 500 mg via INTRAVENOUS
  Filled 2019-06-20: qty 500

## 2019-06-20 MED ORDER — ACETAMINOPHEN 325 MG PO TABS
650.0000 mg | ORAL_TABLET | Freq: Four times a day (QID) | ORAL | Status: DC | PRN
  Administered 2019-06-21: 08:00:00 650 mg via ORAL
  Filled 2019-06-20 (×2): qty 2

## 2019-06-20 NOTE — ED Notes (Signed)
ED TO INPATIENT HANDOFF REPORT  Name/Age/Gender Shane Bond 59 y.o. male  Code Status    Code Status Orders  (From admission, onward)         Start     Ordered   06/20/19 0018  Full code  Continuous     06/20/19 0018        Code Status History    This patient has a current code status but no historical code status.   Advance Care Planning Activity      Home/SNF/Other Home  Chief Complaint Sepsis due to urinary tract infection ([place]) [A41.9, N39.0]  Level of Care/Admitting Diagnosis ED Disposition    ED Disposition Condition Comment   Admit  Hospital Area: [facility] [id number]  Level of Care: Telemetry [5]  Admit to tele based on following criteria: Other see comments  Comments: Hyperkalemia  May admit patient to [facility] or [facility] if equivalent level of care is available:: Yes  Covid Evaluation: Confirmed COVID Negative  Diagnosis: Sepsis due to urinary tract infection [facility]) [id number]  Admitting Physician: Lenore Cordia [id number]  Attending Physician: Lenore Cordia [id number]  Estimated length of stay: past midnight tomorrow  Certification:: I certify this patient will need inpatient services for at least 2 midnights       Medical History Past Medical History:  Diagnosis Date  . 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 Bladder atonia   . BPH (benign prostatic hyperplasia)   . CAD, NATIVE VESSEL 10/29/2009  . Diabetes mellitus type 2, uncontrolled, with complications ([place])   . Hyperlipidemia LDL goal <70   . Kidney stone   . OBESITY 10/22/2009    Allergies Allergies  Allergen Reactions  . Lipitor [Atorvastatin] Rash    IV Location/Drains/Wounds Patient Lines/Drains/Airways Status   Active Line/Drains/Airways    Name:   Placement date:   Placement time:   Site:   Days:   Peripheral IV 06/19/19 Left Wrist   06/19/19    2137    Wrist   1          Labs/Imaging Results for orders placed or performed during the hospital  encounter of 06/19/19 (from the past 48 hour(s))  Blood Culture (routine x 2)     Status: None (Preliminary result)   Collection Time: 06/19/19  8:43 PM   Specimen: BLOOD  Result Value Ref Range   Specimen Description      BLOOD LEFT ANTECUBITAL Performed at [facility], [place] [address], [place], [place] [address]    Special Requests      BOTTLES DRAWN AEROBIC AND ANAEROBIC Blood Culture adequate volume Performed at [place] [address] [place], [place] [address]    Culture  Setup Time      GRAM NEGATIVE RODS AEROBIC BOTTLE ONLY Performed at [facility], [place] [address], [place], [place] [address]    Culture PENDING    Report Status PENDING   Blood Culture (routine x 2)     Status: None (Preliminary result)   Collection Time: 06/19/19  8:48 PM   Specimen: BLOOD RIGHT HAND  Result Value Ref Range   Specimen Description      BLOOD RIGHT HAND Performed at [place] [address], [place], [place] [address]    Special Requests      BOTTLES DRAWN AEROBIC AND ANAEROBIC Blood Culture adequate volume Performed at [place] [address] [place], [place] [address]  Culture  Setup Time      GRAM NEGATIVE RODS IN BOTH AEROBIC AND ANAEROBIC BOTTLES Organism ID to follow    Culture      NO GROWTH < 12 HOURS Performed at [facility], [place] [address], [place], [place] [address]    Report Status PENDING   Lactic acid, plasma     Status: Abnormal   Collection Time: 06/19/19  9:10 PM  Result Value Ref Range   Lactic Acid, Venous 2.1 (HH) 0.5 - 1.9 mmol/L    Comment: CRITICAL RESULT CALLED TO, READ BACK BY AND VERIFIED WITH: RN J NASH AT 2153 06/19/19 CRUICKSHANK A Performed at [facility], [place] [address], [place], [place] [address]   Comprehensive metabolic panel     Status: Abnormal   Collection Time: 06/19/19  9:10 PM  Result Value Ref Range   Sodium  125 (L) 135 - 145 mmol/L   Potassium 5.7 (H) 3.5 - 5.1 mmol/L   Chloride 95 (L) 98 - 111 mmol/L   CO2 17 (L) 22 - 32 mmol/L   Glucose, Bld 409 (H) 70 - 99 mg/dL    Comment: Glucose reference range applies only to samples taken after fasting for at least 8 hours.   BUN 56 (H) 6 - 20 mg/dL   Creatinine, Ser 2.79 (H) 0.61 - 1.24 mg/dL   Calcium 8.6 (L) 8.9 - 10.3 mg/dL   Total Protein 7.0 6.5 - 8.1 g/dL   Albumin 3.4 (L) 3.5 - 5.0 g/dL   AST 49 (H) 15 - 41 U/L   ALT 31 0 - 44 U/L   Alkaline Phosphatase 48 38 - 126 U/L   Total Bilirubin 2.5 (H) 0.3 - 1.2 mg/dL   GFR calc non Af Amer 24 (L) >60 mL/min   GFR calc Af Amer 28 (L) >60 mL/min   Anion gap 13 5 - 15    Comment: Performed at [facility], [place] [address], [place], [place] [address]  CBC WITH DIFFERENTIAL     Status: Abnormal   Collection Time: 06/19/19  9:10 PM  Result Value Ref Range   WBC 12.2 (H) 4.0 - 10.5 K/uL   RBC 3.78 (L) 4.22 - 5.81 MIL/uL   Hemoglobin 12.3 (L) 13.0 - 17.0 g/dL   HCT 36.4 (L) 39.0 - 52.0 %   MCV 96.3 80.0 - 100.0 fL   MCH 32.5 26.0 - 34.0 pg   MCHC 33.8 30.0 - 36.0 g/dL   RDW 13.4 11.5 - 15.5 %   Platelets 163 150 - 400 K/uL   nRBC 0.0 0.0 - 0.2 %   Neutrophils Relative % 84 %   Neutro Abs 10.3 (H) 1.7 - 7.7 K/uL   Lymphocytes Relative 5 %   Lymphs Abs 0.6 (L) 0.7 - 4.0 K/uL   Monocytes Relative 9 %   Monocytes Absolute 1.1 (H) 0.1 - 1.0 K/uL   Eosinophils Relative 0 %   Eosinophils Absolute 0.0 0.0 - 0.5 K/uL   Basophils Relative 0 %   Basophils Absolute 0.0 0.0 - 0.1 K/uL   Immature Granulocytes 2 %   Abs Immature Granulocytes 0.28 (H) 0.00 - 0.07 K/uL    Comment: Performed at [facility], [place] [address] [place],  [address]  Protime-INR     Status: Abnormal   Collection Time: 06/19/19 10:00 PM  Result Value Ref Range   Prothrombin Time 23.8 (H) 11.4 - 15.2 seconds   INR 2.2 (H) 0.8 -  1.2    Comment: (NOTE) INR goal varies based on device  and disease states. Performed at [facility], [place] [address], [place], [place] [address]   APTT     Status: Abnormal   Collection Time: 06/19/19 10:00 PM  Result Value Ref Range   aPTT 37 (H) 24 - 36 seconds    Comment:        IF BASELINE aPTT IS ELEVATED, SUGGEST PATIENT RISK ASSESSMENT BE USED TO DETERMINE APPROPRIATE ANTICOAGULANT THERAPY. Performed at [facility], [place] [address] [place], [place] [address]   Urinalysis, Routine w reflex microscopic     Status: Abnormal   Collection Time: 06/19/19 10:21 PM  Result Value Ref Range   Color, Urine AMBER (A) YELLOW    Comment: BIOCHEMICALS MAY BE AFFECTED BY COLOR   APPearance CLOUDY (A) CLEAR   Specific Gravity, Urine 1.015 1.005 - 1.030   pH 5.0 5.0 - 8.0   Glucose, UA 50 (A) NEGATIVE mg/dL   Hgb urine dipstick MODERATE (A) NEGATIVE   Bilirubin Urine NEGATIVE NEGATIVE   Ketones, ur 5 (A) NEGATIVE mg/dL   Protein, ur 100 (A) NEGATIVE mg/dL   Nitrite NEGATIVE NEGATIVE   Leukocytes,Ua LARGE (A) NEGATIVE   RBC / HPF 11-20 0 - 5 RBC/hpf   WBC, UA >50 (H) 0 - 5 WBC/hpf   Bacteria, UA MANY (A) NONE SEEN   WBC Clumps PRESENT    Mucus PRESENT    Amorphous Crystal PRESENT     Comment: Performed at [facility], [place] [address], [place], [place] [address]  Respiratory Panel by RT PCR (Flu A&B, Covid) - Nasopharyngeal Swab     Status: None   Collection Time: 06/19/19 10:21 PM   Specimen: Nasopharyngeal Swab  Result Value Ref Range   SARS Coronavirus 2 by RT PCR NEGATIVE NEGATIVE    Comment: (NOTE) SARS-CoV-2 target nucleic acids are NOT DETECTED. The SARS-CoV-2 RNA is generally detectable in upper respiratoy specimens during the acute phase of infection. The lowest concentration of SARS-CoV-2 viral copies this assay can detect is 131 copies/mL. A negative result does not preclude SARS-Cov-2 infection and should not be used as the sole basis for treatment or other patient  management decisions. A negative result may occur with  improper specimen collection/handling, submission of specimen other than nasopharyngeal swab, presence of viral mutation(s) within the areas targeted by this assay, and inadequate number of viral copies (<131 copies/mL). A negative result must be combined with clinical observations, patient history, and epidemiological information. The expected result is Negative. Fact Sheet for Patients:  PinkCheek.be Fact Sheet for Healthcare Providers:  GravelBags.it This test is not yet ap proved or cleared by the [place] FDA and  has been authorized for detection and/or diagnosis of SARS-CoV-2 by FDA under an Emergency Use Authorization (EUA). This EUA will remain  in effect (meaning this test can be used) for the duration of the COVID-19 declaration under Section 564(b)(1) of the Act, 21 U.S.C. section 360bbb-3(b)(1), unless the authorization is terminated or revoked sooner.    Influenza A by PCR NEGATIVE NEGATIVE   Influenza B by PCR NEGATIVE NEGATIVE    Comment: (NOTE) The Xpert Xpress SARS-CoV-2/FLU/RSV assay is intended as an aid in  the diagnosis of influenza from Nasopharyngeal swab specimens and  should not be used as a sole basis for treatment. Nasal washings and  aspirates are unacceptable for Xpert Xpress SARS-CoV-2/FLU/RSV  testing. Fact Sheet for Patients: PinkCheek.be Fact Sheet for  Healthcare Providers: GravelBags.it This test is not yet approved or cleared by the [place] and  has been authorized for detection and/or diagnosis of SARS-CoV-2 by  FDA under an Emergency Use Authorization (EUA). This EUA will remain  in effect (meaning this test can be used) for the duration of the  Covid-19 declaration under Section 564(b)(1) of the Act, 21  U.S.C. section 360bbb-3(b)(1), unless the authorization  is  terminated or revoked. Performed at [facility], [place] [address], [place], [place] [address]   Lactic acid, plasma     Status: None   Collection Time: 06/19/19 10:43 PM  Result Value Ref Range   Lactic Acid, Venous 1.9 0.5 - 1.9 mmol/L    Comment: Performed at [facility], [place] [address], [place], [place] [address]  CBG monitoring, ED     Status: Abnormal   Collection Time: 06/20/19 12:41 AM  Result Value Ref Range   Glucose-Capillary 317 (H) 70 - 99 mg/dL    Comment: Glucose reference range applies only to samples taken after fasting for at least 8 hours.  CBC     Status: Abnormal   Collection Time: 06/20/19  5:00 AM  Result Value Ref Range   WBC 9.1 4.0 - 10.5 K/uL   RBC 3.54 (L) 4.22 - 5.81 MIL/uL   Hemoglobin 11.2 (L) 13.0 - 17.0 g/dL   HCT 34.4 (L) 39.0 - 52.0 %   MCV 97.2 80.0 - 100.0 fL   MCH 31.6 26.0 - 34.0 pg   MCHC 32.6 30.0 - 36.0 g/dL   RDW 13.2 11.5 - 15.5 %   Platelets 124 (L) 150 - 400 K/uL   nRBC 0.0 0.0 - 0.2 %    Comment: Performed at [facility], [place] [address], [place], [place] [id number]  Basic metabolic panel     Status: Abnormal   Collection Time: 06/20/19  5:00 AM  Result Value Ref Range   Sodium 133 (L) 135 - 145 mmol/L    Comment: DELTA CHECK NOTED   Potassium 3.7 3.5 - 5.1 mmol/L    Comment: DELTA CHECK NOTED   Chloride 104 98 - 111 mmol/L   CO2 17 (L) 22 - 32 mmol/L   Glucose, Bld 342 (H) 70 - 99 mg/dL    Comment: Glucose reference range applies only to samples taken after fasting for at least 8 hours.   BUN 46 (H) 6 - 20 mg/dL   Creatinine, Ser 2.15 (H) 0.61 - 1.24 mg/dL   Calcium 8.2 (L) 8.9 - 10.3 mg/dL   GFR calc non Af Amer 33 (L) >60 mL/min   GFR calc Af Amer 38 (L) >60 mL/min   Anion gap 12 5 - 15    Comment: Performed at [facility], [place] [address], [place], [place] [address]  HIV Antibody (routine testing w rflx)     Status: None   Collection Time:  06/20/19  5:22 AM  Result Value Ref Range   HIV Screen 4th Generation wRfx Non Reactive Non Reactive    Comment: Performed at [facility], [place] [address], [place], [place] [address]  CBG monitoring, ED     Status: Abnormal   Collection Time: 06/20/19 10:05 AM  Result Value Ref Range   Glucose-Capillary 298 (H) 70 - 99 mg/dL    Comment: Glucose reference range applies only to samples taken after fasting for at least 8 hours.  CBG monitoring, ED     Status:  Abnormal   Collection Time: 06/20/19 12:04 PM  Result Value Ref Range   Glucose-Capillary 317 (H) 70 - 99 mg/dL    Comment: Glucose reference range applies only to samples taken after fasting for at least 8 hours.   US RENAL  Result Date: 06/20/2019 CLINICAL DATA:  Initial evaluation for acute renal insufficiency. EXAM: RENAL / URINARY TRACT ULTRASOUND COMPLETE COMPARISON:  Prior CT from 02/19/2019 FINDINGS: Right Kidney: Renal measurements: 13.9 x 6.5 x 7.1 cm = volume: 335.8 mL. Echogenicity within normal limits. No nephrolithiasis or hydronephrosis. 3.3 x 3.0 x 3.4 cm simple cyst present at the upper pole. Left Kidney: Renal measurements: 12.7 x 4.0 x 7.5 cm = volume: 294.3 mL. Echogenicity within normal limits. 6 mm echogenic focus within the lower pole the left kidney suspicious for a nonobstructive stone. No hydronephrosis. No focal renal mass. Bladder: Appears normal for degree of bladder distention. Other: None. IMPRESSION: 1. No hydronephrosis or other acute abnormality. 2. Probable 6 mm nonobstructive left renal nephrolithiasis. 3. 3.4 cm simple cyst at the upper pole of the right kidney. Electronically Signed   By: Jeannine Boga M.D.   On: 06/20/2019 01:15   DG Chest Port 1 View  Result Date: 06/19/2019 CLINICAL DATA:  Weakness and fevers EXAM: PORTABLE CHEST 1 VIEW COMPARISON:  01/03/2019 FINDINGS: Cardiac shadow is within normal limits. Tortuous thoracic aorta is again seen. Patchy bibasilar atelectasis/infiltrate is  noted. No sizable effusion is seen. No bony abnormality is noted. IMPRESSION: Patchy bibasilar opacities consistent with atelectasis/early infiltrate. Electronically Signed   By: Inez Catalina M.D.   On: 06/19/2019 21:31    Pending Labs Unresulted Labs (From admission, onward)    Start     Ordered   06/21/19 [id number]  Basic metabolic panel  Tomorrow morning,   R     06/20/19 0936   06/21/19 0500  CBC  Tomorrow morning,   R     06/20/19 0936   06/20/19 0936  Expectorated sputum assessment w rflx to resp cult  Once,   R     06/20/19 0936   06/19/19 2048  Blood Culture ID Panel (Reflexed)  Once,   STAT     06/19/19 2048   06/19/19 2043  Urine culture  ONCE - STAT,   STAT     06/19/19 2044          Vitals/Pain Today's Vitals   06/20/19 1200 06/20/19 1215 06/20/19 1430 06/20/19 1530  BP: 107/83  (!) 100/57 104/60  Pulse: (!) 107  (!) 116 (!) 112  Resp: 18  (!) 25 19  Temp:  99.7 F (37.6 C)    TempSrc:  Oral    SpO2: 97%  95% 96%  Weight:      Height:        Isolation Precautions No active isolations  Medications Medications  cefTRIAXone (ROCEPHIN) 2 g in sodium chloride 0.9 % 100 mL IVPB (0 g Intravenous Stopped 06/19/19 2339)  heparin injection 5,000 Units (5,000 Units Subcutaneous Given 06/20/19 1439)  sodium chloride flush (NS) 0.9 % injection 3 mL (3 mLs Intravenous Not Given 06/20/19 1008)  acetaminophen (TYLENOL) tablet 650 mg (has no administration in time range)    Or  acetaminophen (TYLENOL) suppository 650 mg (has no administration in time range)  ondansetron (ZOFRAN) tablet 4 mg (has no administration in time range)    Or  ondansetron (ZOFRAN) injection 4 mg (has no administration in time range)  insulin glargine (LANTUS) injection 50 Units (50 Units Subcutaneous  Given 06/20/19 0120)  insulin aspart (novoLOG) injection 0-9 Units (7 Units Subcutaneous Given 06/20/19 1209)  insulin aspart (novoLOG) injection 0-5 Units (4 Units Subcutaneous Given 06/20/19 0120)  aspirin  EC tablet 81 mg (81 mg Oral Given 06/20/19 1022)  tamsulosin (FLOMAX) capsule 0.4 mg (0.4 mg Oral Given 06/20/19 0124)  0.9 %  sodium chloride infusion ( Intravenous New Bag/Given 06/20/19 1008)  azithromycin (ZITHROMAX) 500 mg in sodium chloride 0.9 % 250 mL IVPB (0 mg Intravenous Stopped 06/20/19 1216)  sodium chloride 0.9 % bolus 1,000 mL (has no administration in time range)  sodium chloride 0.9 % bolus 1,000 mL (0 mLs Intravenous Stopped 06/20/19 0125)    And  sodium chloride 0.9 % bolus 1,000 mL (0 mLs Intravenous Stopped 06/20/19 0125)    And  sodium chloride 0.9 % bolus 1,000 mL (0 mLs Intravenous Stopped 06/20/19 0125)    And  sodium chloride 0.9 % bolus 500 mL (0 mLs Intravenous Stopped 06/20/19 0125)  sodium zirconium cyclosilicate (LOKELMA) packet 10 g (10 g Oral Given 06/20/19 0123)  insulin aspart (novoLOG) injection 5 Units (5 Units Subcutaneous Given 06/20/19 0121)    Mobility walks with device

## 2019-06-20 NOTE — Progress Notes (Signed)
PHARMACY - PHYSICIAN COMMUNICATION CRITICAL VALUE ALERT - BLOOD CULTURE IDENTIFICATION (BCID)  Shane Bond is an 59 y.o. male who presented to [facility] on [facility]/2019 with a chief complaint of weakness, UTI  Assessment: 4 of 4 bottles from blood cx's with [place] pneumo  Name of physician (or Provider) ContactedLonny Prude  Current antibiotics: Rocephin 2g daily/Zithromax 500mg  daily  Changes to prescribed antibiotics recommended:  No recommended changes  No results found for this or any previous visit.  Kara Mead 06/20/2019  4:25 PM

## 2019-06-20 NOTE — Progress Notes (Signed)
[facility]  PROGRESS NOTE  STEPHANOS TIET [id number] DOB: [birth year] DOA: 06/19/2019 PCP: Gildardo Pounds, NP Admit date - 06/19/2019   Admitting Physician Lenore Cordia, MD  Outpatient Primary MD for the patient is Gildardo Pounds, NP  LOS - 1 Brief Narrative   LEEROY RUEDAS is a 59 y.o. year old male with medical history significant for prior back injury neurogenic bladder with self-catheterization since 1999, type 2 diabetes, HTN, HLD who presented on 06/19/2019 with generalized weakness over the past 2 to 3 days, with reports of difficulty with self catheterizations with decreased urine output, embolize recurrent falls at home.  Patient was admitted with working diagnosis of sepsis presumed secondary to UTI Hospital course complicated by AKI, hyperkalemia, hyperglycemia in setting of diabetes.    Subjective  Today reports back pain.  Reports he has been falling several times at home in the last several days with worsening shortness of breath, and generalized feeling poorly A & P   Sepsis presumed secondary to UTI.  Leukocytosis has resolved, still tachycardic and tachypneic, febrile on admission, with UA concerning for UTI and patient has history of self-catheterization.  Chest x-ray also mentions infiltrates that could represent either atelectasis versus early pneumonia.  Had mild lactic acidosis of 2 on admission, status post IV fluid bolusing in the ED renal ultrasound shows nonobstructive 6 mm stone with no hydronephrosis -Continue empiric ceftriaxone for UTI, follow urine culture sensitivities -Add azithromycin in case pneumonia given some endorsement of shortness of breath -Spirometry -Continue IV fluids -Monitor blood cultures  Bilateral infiltrates, concern for community-acquired pneumonia.  Has chronic cough, endorses some worsening shortness of breath, tachypneic on exam.  Tachypnea like related to sepsis from above, but given shortness of breath must consider  possible pneumonia.  Covid test negative.  Tachycardia likely driven by sepsis however if hemodynamics/respiratory improved with antibiotics PE safely on differential and can consider CTA but currently have other more likely etiologies -Add azithromycin, continue ceftriaxone -Sputum culture -Insulin spirometry  Syncopal episodes.  Likely in the setting of sepsis, dehydration.  Reports worsening weakness over several days and falls at home..  Abrasions on bilateral knees.  Patient following commands, no focal deficits.  Reports falling unstable and currently endorsing back pain -If mental status worsens obtain CT head -Obtain x-ray of lumbar/thoracic spine  AKI with hyperkalemia.  Potassium 5.7 on admission status post Lokelma, now 3.7.  Creatinine peak of 2.79, now down trended to 2.15 from previous baseline around 1.  Likely all been driven by sepsis etiology. -Continue scheduled IV fluids over next 24 hours -Hold home lisinopril  Type 2 diabetes with hyperglycemia, A1c 8.2.  Likely worsening control in the setting of infection. -Holding home Metformin, Victoza -Continue Lantus 50 units, home dose -Sliding scale as needed, monitor CBGs -May need scheduled mealtime given FBGs's in 300s  Hypertension, currently at goal. -Holding home lisinopril due to hyperkalemia and relative hypotension on presentation. -Continue to closely monitor  CAD.  Reports chest tightness at home.  No chest pain currently. -Continue aspirin     Family Communication  : None  Code Status : Full  Disposition Plan  :  Patient is from home. Anticipated d/c date: 2 to 3 days. Barriers to d/c or necessity for inpatient status:  Clinical improvement on IV antibiotics, sepsis physiology needs to resolve Consults  : None  Procedures  : None  DVT Prophylaxis  : Heparin  Lab Results  Component Value Date   PLT 124 (  L) 06/20/2019    Diet :  Diet Order            Diet heart healthy/carb modified Room service  appropriate? Yes; Fluid consistency: Thin  Diet effective now               Inpatient Medications Scheduled Meds: . aspirin EC  81 mg Oral Daily  . heparin  5,000 Units Subcutaneous Q8H  . insulin aspart  0-5 Units Subcutaneous QHS  . insulin aspart  0-9 Units Subcutaneous TID WC  . insulin glargine  50 Units Subcutaneous QHS  . sodium chloride flush  3 mL Intravenous Q12H  . tamsulosin  0.4 mg Oral QPC supper   Continuous Infusions: . sodium chloride    . azithromycin    . cefTRIAXone (ROCEPHIN)  IV Stopped (06/19/19 2339)   PRN Meds:.acetaminophen **OR** acetaminophen, ondansetron **OR** ondansetron (ZOFRAN) IV  Antibiotics  :   Anti-infectives (From admission, onward)   Start     Dose/Rate Route Frequency Ordered Stop   06/20/19 0930  azithromycin (ZITHROMAX) 500 mg in sodium chloride 0.9 % 250 mL IVPB     500 mg 250 mL/hr over 60 Minutes Intravenous Every 24 hours 06/20/19 0922     06/19/19 2115  cefTRIAXone (ROCEPHIN) 2 g in sodium chloride 0.9 % 100 mL IVPB     2 g 200 mL/hr over 30 Minutes Intravenous Every 24 hours 06/19/19 2102         Objective   Vitals:   06/20/19 0538 06/20/19 0600 06/20/19 0630 06/20/19 0900  BP: (!) 103/59 118/62 108/61 127/71  Pulse: (!) 114 (!) 111 (!) 111 (!) 115  Resp: (!) 27 (!) 27 (!) 22 (!) 22  Temp:      TempSrc:      SpO2: 93% 93% 91% 94%  Weight:      Height:        SpO2: 94 %  Wt Readings from Last 3 Encounters:  06/19/19 108.9 kg  05/14/19 113.4 kg  11/05/18 113.4 kg    No intake or output data in the 24 hours ending 06/20/19 [id number]  Physical Exam:     Awake Alert, Oriented X self, place, year.  Somnolent but easily arousable No new F.N deficits,  [place].AT, Tachypneic, no accessory muscle usage, on room air, no wheezing or crackles heard Tachycardic,No Gallops,Rubs or new Murmurs,  +ve B.Sounds, Abd Soft, No tenderness, No rebound, guarding or rigidity. No Cyanosis, abrasions on bilateral knees  I have  personally reviewed the following:   Data Reviewed:  CBC Recent Labs  Lab 06/19/19 2110 06/20/19 0500  WBC 12.2* 9.1  HGB 12.3* 11.2*  HCT 36.4* 34.4*  PLT 163 124*  MCV 96.3 97.2  MCH 32.5 31.6  MCHC 33.8 32.6  RDW 13.4 13.2  LYMPHSABS 0.6*  --   MONOABS 1.1*  --   EOSABS 0.0  --   BASOSABS 0.0  --     Chemistries  Recent Labs  Lab 06/19/19 2110 06/20/19 0500  NA 125* 133*  K 5.7* 3.7  CL 95* 104  CO2 17* 17*  GLUCOSE 409* 342*  BUN 56* 46*  CREATININE 2.79* 2.15*  CALCIUM 8.6* 8.2*  AST 49*  --   ALT 31  --   ALKPHOS 48  --   BILITOT 2.5*  --    ------------------------------------------------------------------------------------------------------------------ No results for input(s): CHOL, HDL, LDLCALC, TRIG, CHOLHDL, LDLDIRECT in the last 72 hours.  Lab Results  Component Value Date   HGBA1C 8.2 (  A) 05/14/2019   ------------------------------------------------------------------------------------------------------------------ No results for input(s): TSH, T4TOTAL, T3FREE, THYROIDAB in the last 72 hours.  Invalid input(s): FREET3 ------------------------------------------------------------------------------------------------------------------ No results for input(s): VITAMINB12, FOLATE, FERRITIN, TIBC, IRON, RETICCTPCT in the last 72 hours.  Coagulation profile Recent Labs  Lab 06/19/19 2200  INR 2.2*    No results for input(s): DDIMER in the last 72 hours.  Cardiac Enzymes No results for input(s): CKMB, TROPONINI, MYOGLOBIN in the last 168 hours.  Invalid input(s): CK ------------------------------------------------------------------------------------------------------------------ No results found for: BNP  Micro Results Recent Results (from the past 240 hour(s))  Blood Culture (routine x 2)     Status: None (Preliminary result)   Collection Time: 06/19/19  8:43 PM   Specimen: BLOOD  Result Value Ref Range Status   Specimen Description    Final    BLOOD LEFT ANTECUBITAL Performed at [facility] [address] [place], [place] [address]    Special Requests   Final    BOTTLES DRAWN AEROBIC AND ANAEROBIC Blood Culture adequate volume Performed at [facility] [address] [place], [place] [address]    Culture   Final    NO GROWTH < 12 HOURS Performed at [facility] [address], [place], [place] [address]    Report Status PENDING  Incomplete  Blood Culture (routine x 2)     Status: None (Preliminary result)   Collection Time: 06/19/19  8:48 PM   Specimen: BLOOD RIGHT HAND  Result Value Ref Range Status   Specimen Description   Final    BLOOD RIGHT HAND Performed at [facility] [address] [place], [place] [address]    Special Requests   Final    BOTTLES DRAWN AEROBIC AND ANAEROBIC Blood Culture adequate volume Performed at [facility] [address] [place], [place] [address]    Culture   Final    NO GROWTH < 12 HOURS Performed at [facility] [address] [place], [place] [address]    Report Status PENDING  Incomplete  Respiratory Panel by RT PCR (Flu A&B, Covid) - Nasopharyngeal Swab     Status: None   Collection Time: 06/19/19 10:21 PM   Specimen: Nasopharyngeal Swab  Result Value Ref Range Status   SARS Coronavirus 2 by RT PCR NEGATIVE NEGATIVE Final    Comment: (NOTE) SARS-CoV-2 target nucleic acids are NOT DETECTED. The SARS-CoV-2 RNA is generally detectable in upper respiratoy specimens during the acute phase of infection. The lowest concentration of SARS-CoV-2 viral copies this assay can detect is 131 copies/mL. A negative result does not preclude SARS-Cov-2 infection and should not be used as the sole basis for treatment or other patient management decisions. A negative result may occur with  improper specimen collection/handling, submission of specimen other than nasopharyngeal swab,  presence of viral mutation(s) within the areas targeted by this assay, and inadequate number of viral copies (<131 copies/mL). A negative result must be combined with clinical observations, patient history, and epidemiological information. The expected result is Negative. Fact Sheet for Patients:  PinkCheek.be Fact Sheet for Healthcare Providers:  GravelBags.it This test is not yet ap proved or cleared by the [place] FDA and  has been authorized for detection and/or diagnosis of SARS-CoV-2 by FDA under an Emergency Use Authorization (EUA). This EUA will remain  in effect (meaning this test can be used) for the duration of the COVID-19 declaration under Section 564(b)(1) of the Act, 21 U.S.C. section 360bbb-3(b)(1),  unless the authorization is terminated or revoked sooner.    Influenza A by PCR NEGATIVE NEGATIVE Final   Influenza B by PCR NEGATIVE NEGATIVE Final    Comment: (NOTE) The Xpert Xpress SARS-CoV-2/FLU/RSV assay is intended as an aid in  the diagnosis of influenza from Nasopharyngeal swab specimens and  should not be used as a sole basis for treatment. Nasal washings and  aspirates are unacceptable for Xpert Xpress SARS-CoV-2/FLU/RSV  testing. Fact Sheet for Patients: PinkCheek.be Fact Sheet for Healthcare Providers: GravelBags.it This test is not yet approved or cleared by the [place] FDA and  has been authorized for detection and/or diagnosis of SARS-CoV-2 by  FDA under an Emergency Use Authorization (EUA). This EUA will remain  in effect (meaning this test can be used) for the duration of the  Covid-19 declaration under Section 564(b)(1) of the Act, 21  U.S.C. section 360bbb-3(b)(1), unless the authorization is  terminated or revoked. Performed at [facility], [place] [address] [place], [place] [address]     Radiology  Reports US RENAL  Result Date: 06/20/2019 CLINICAL DATA:  Initial evaluation for acute renal insufficiency. EXAM: RENAL / URINARY TRACT ULTRASOUND COMPLETE COMPARISON:  Prior CT from 02/19/2019 FINDINGS: Right Kidney: Renal measurements: 13.9 x 6.5 x 7.1 cm = volume: 335.8 mL. Echogenicity within normal limits. No nephrolithiasis or hydronephrosis. 3.3 x 3.0 x 3.4 cm simple cyst present at the upper pole. Left Kidney: Renal measurements: 12.7 x 4.0 x 7.5 cm = volume: 294.3 mL. Echogenicity within normal limits. 6 mm echogenic focus within the lower pole the left kidney suspicious for a nonobstructive stone. No hydronephrosis. No focal renal mass. Bladder: Appears normal for degree of bladder distention. Other: None. IMPRESSION: 1. No hydronephrosis or other acute abnormality. 2. Probable 6 mm nonobstructive left renal nephrolithiasis. 3. 3.4 cm simple cyst at the upper pole of the right kidney. Electronically Signed   By: Jeannine Boga M.D.   On: 06/20/2019 01:15   DG Chest Port 1 View  Result Date: 06/19/2019 CLINICAL DATA:  Weakness and fevers EXAM: PORTABLE CHEST 1 VIEW COMPARISON:  01/03/2019 FINDINGS: Cardiac shadow is within normal limits. Tortuous thoracic aorta is again seen. Patchy bibasilar atelectasis/infiltrate is noted. No sizable effusion is seen. No bony abnormality is noted. IMPRESSION: Patchy bibasilar opacities consistent with atelectasis/early infiltrate. Electronically Signed   By: Inez Catalina M.D.   On: 06/19/2019 21:31     Time Spent in minutes  30     Desiree Hane M.D on 06/20/2019 at 9:22 AM  To page go to [email or web address] - password [facility]

## 2019-06-21 DIAGNOSIS — B961 Klebsiella pneumoniae [K. pneumoniae] as the cause of diseases classified elsewhere: Secondary | ICD-10-CM

## 2019-06-21 DIAGNOSIS — D649 Anemia, unspecified: Secondary | ICD-10-CM

## 2019-06-21 DIAGNOSIS — R7881 Bacteremia: Secondary | ICD-10-CM

## 2019-06-21 DIAGNOSIS — D696 Thrombocytopenia, unspecified: Secondary | ICD-10-CM

## 2019-06-21 LAB — BASIC METABOLIC PANEL
Anion gap: 8 (ref 5–15)
BUN: 31 mg/dL — ABNORMAL HIGH (ref 6–20)
CO2: 19 mmol/L — ABNORMAL LOW (ref 22–32)
Calcium: 8 mg/dL — ABNORMAL LOW (ref 8.9–10.3)
Chloride: 106 mmol/L (ref 98–111)
Creatinine, Ser: 1.53 mg/dL — ABNORMAL HIGH (ref 0.61–1.24)
GFR calc Af Amer: 57 mL/min — ABNORMAL LOW (ref >60)
GFR calc non Af Amer: 49 mL/min — ABNORMAL LOW (ref >60)
Glucose, Bld: 253 mg/dL — ABNORMAL HIGH (ref 70–99)
Potassium: 3.4 mmol/L — ABNORMAL LOW (ref 3.5–5.1)
Sodium: 133 mmol/L — ABNORMAL LOW (ref 135–145)

## 2019-06-21 LAB — GLUCOSE, CAPILLARY
Glucose-Capillary: 201 mg/dL — ABNORMAL HIGH (ref 70–99)
Glucose-Capillary: 266 mg/dL — ABNORMAL HIGH (ref 70–99)
Glucose-Capillary: 202 mg/dL — ABNORMAL HIGH (ref 70–99)

## 2019-06-21 LAB — CBC
HCT: 29.2 % — ABNORMAL LOW (ref 39.0–52.0)
Hemoglobin: 9.7 g/dL — ABNORMAL LOW (ref 13.0–17.0)
MCH: 31.9 pg (ref 26.0–34.0)
MCHC: 33.2 g/dL (ref 30.0–36.0)
MCV: 96.1 fL (ref 80.0–100.0)
Platelets: 122 10*3/uL — ABNORMAL LOW (ref 150–400)
RBC: 3.04 MIL/uL — ABNORMAL LOW (ref 4.22–5.81)
RDW: 13.6 % (ref 11.5–15.5)
WBC: 6.5 10*3/uL (ref 4.0–10.5)
nRBC: 0 % (ref 0.0–0.2)

## 2019-06-21 LAB — MAGNESIUM: Magnesium: 1.8 mg/dL (ref 1.7–2.4)

## 2019-06-21 MED ORDER — POTASSIUM CHLORIDE 20 MEQ PO PACK
40.0000 meq | PACK | ORAL | Status: AC
  Administered 2019-06-21 (×2): 40 meq via ORAL
  Filled 2019-06-21 (×2): qty 2

## 2019-06-21 MED ORDER — SODIUM CHLORIDE 0.9 % IV SOLN: INTRAVENOUS | Status: DC

## 2019-06-21 MED ORDER — INSULIN ASPART 100 UNIT/ML ~~LOC~~ SOLN
4.0000 [IU] | Freq: Three times a day (TID) | SUBCUTANEOUS | Status: DC
  Administered 2019-06-22 – 2019-06-25 (×8): 4 [IU] via SUBCUTANEOUS

## 2019-06-21 NOTE — Progress Notes (Signed)
[facility]  PROGRESS NOTE  [name] [id number] DOB: 02-Sep-1960 DOA: 06/19/2019 PCP: Gildardo Pounds, NP Admit date - 06/19/2019   Admitting Physician Lenore Cordia, MD  Outpatient Primary MD for the patient is Gildardo Pounds, NP  LOS - 2 Brief Narrative   RUSBEL Bond is a 59 y.o. year old male with medical history significant for prior back injury neurogenic bladder with self-catheterization since 1999, type 2 diabetes, HTN, HLD who presented on 06/19/2019 with generalized weakness over the past 2 to 3 days, with reports of difficulty with self catheterizations with decreased urine output, embolize recurrent falls at home.  Patient was admitted with working diagnosis of sepsis presumed secondary to UTI Hospital course complicated by AKI, hyperkalemia, hyperglycemia in setting of diabetes.    Subjective  Today reports back pain.  Reports he has been falling several times at home in the last several days with worsening shortness of breath, and generalized feeling poorly A & P   Sepsis secondary to Klebsiella UTI with Klebsiella bacteremia leukocytosis has resolved, still tachycardic and tachypneic, febrile on admission, with UA concerning for UTI and patient has history of at home, renal ultrasound shows nonobstructive 6 mm stone with no hydronephrosis.   Chest x-ray also mentions infiltrates that could represent either atelectasis versus early pneumonia.  Sepsis physiology has resolved  -Continue empiric ceftriaxone for UTI, follow urine/blood culture sensitivities -Continue azithromycin in case pneumonia given some endorsement of shortness of breath -Incentive spirometry -Repeat blood cultures, to ensure clearance of bacteremia  Bilateral infiltrates, concern for community-acquired pneumonia.  Has chronic cough, endorses some worsening shortness of breath, tachypneic on presentation, related to sepsis from above,. but given shortness of breath must consider possible  pneumonia.  Covid test negative.   - azithromycin, continue ceftriaxone -Sputum culture -Incentive spirometry  Syncopal episodes.  Likely in the setting of sepsis, dehydration.  Reports worsening weakness over several days and falls at home..  Abrasions on bilateral knees.  Patient following commands, no focal deficits.  No longer endorsing back pain -Monitor  AKI with hyperkalemia.  Potassium 5.7 on admission status post Lokelma, now 3. 4.  Creatinine peak of 2.79, now down trended to 1.53 with IV fluids from previous baseline around 1.  Likely all been driven by sepsis etiology in setting of UTI -Continue IV fluids x24 hours and reassess, continue to encourage oral hydration -Hold home lisinopril  Bladder atonia.  Patient self catheterizes at home for many years. -Currently with Foley catheter in place  Hypokalemia, mild.  Potassium of 3.4 likely related to [facility] given on presentation for hyperkalemia of 5.7.  Status post 40 mEq x 2 today. -Repeat BMP in a.m.  Type 2 diabetes with hyperglycemia, A1c 8.2.  Likely worsening control in the setting of infection.  FBG's in the 260s -Holding home Metformin, Victoza -Continue Lantus 50 units, home dose -Sliding scale as needed, monitor CBGs -Add NovoLog 4 units 3 times daily with meal coverage  Hypertension presented with hypotension.  SBP range 97/60-104/63 -Continue IV fluids x24 hours and reassess -Holding home lisinopril due to hyperkalemia and relative hypotension on presentation. -Continue to closely monitor  CAD.  Reports chest tightness at home.  No chest pain currently. -Continue aspirin  Normocytic anemia.  Prior hemoglobin 10.7 on 01/2019.  12.3 admission, slowly downtrending, currently 9.7.  No localizing signs or symptoms of bleeding. -Repeat CBC in a.m. -Check iron panel  Thrombocytopenia, new.  Previous platelet count normal limits, has downtrending from 163-122  today.  Again no signs or symptoms of bleeding.   Currently on subcu heparin -Monitor CBC in a.m.   Family Communication  : None  Code Status : Full  Disposition Plan  :  Patient is from home. Anticipated d/c date: 2 to 3 days. Barriers to d/c or necessity for inpatient status:  Clinical improvement on IV antibiotics, pending blood culture sensitivities given Klebsiella bacteremia, close monitoring of blood pressure given relative hypotension requiring IV fluids. Consults  : None  Procedures  : None  DVT Prophylaxis  : Heparin  Lab Results  Component Value Date   PLT 122 (L) 06/21/2019    Diet :  Diet Order            Diet heart healthy/carb modified Room service appropriate? Yes; Fluid consistency: Thin  Diet effective now               Inpatient Medications Scheduled Meds: . aspirin EC  81 mg Oral Daily  . azithromycin  500 mg Oral Daily  . Chlorhexidine Gluconate Cloth  6 each Topical Daily  . heparin  5,000 Units Subcutaneous Q8H  . insulin aspart  0-5 Units Subcutaneous QHS  . insulin aspart  0-9 Units Subcutaneous TID WC  . insulin glargine  50 Units Subcutaneous QHS  . sodium chloride flush  3 mL Intravenous Q12H  . tamsulosin  0.4 mg Oral QPC supper   Continuous Infusions: . cefTRIAXone (ROCEPHIN)  IV 2 g (06/21/19 2116)   PRN Meds:.acetaminophen **OR** acetaminophen, lip balm, ondansetron **OR** ondansetron (ZOFRAN) IV  Antibiotics  :   Anti-infectives (From admission, onward)   Start     Dose/Rate Route Frequency Ordered Stop   06/21/19 1000  azithromycin (ZITHROMAX) tablet 500 mg     500 mg Oral Daily 06/20/19 1634     06/20/19 1000  azithromycin (ZITHROMAX) 500 mg in sodium chloride 0.9 % 250 mL IVPB  Status:  Discontinued     500 mg 250 mL/hr over 60 Minutes Intravenous Every 24 hours 06/20/19 0922 06/20/19 1634   06/19/19 2115  cefTRIAXone (ROCEPHIN) 2 g in sodium chloride 0.9 % 100 mL IVPB     2 g 200 mL/hr over 30 Minutes Intravenous Every 24 hours 06/19/19 2102         Objective    Vitals:   06/21/19 0558 06/21/19 1056 06/21/19 1300 06/21/19 2018  BP: 110/71 (!) 98/59 97/60 104/63  Pulse: 90 76 70 82  Resp: (!) 22 14  (!) 22  Temp: 98.9 F (37.2 C) 97.7 F (36.5 C) 98 F (36.7 C) 98.6 F (37 C)  TempSrc: Oral Oral Oral Oral  SpO2: 92% 98% 97% 91%  Weight:      Height:        SpO2: 91 %  Wt Readings from Last 3 Encounters:  06/21/19 115.8 kg  05/14/19 113.4 kg  11/05/18 113.4 kg     Intake/Output Summary (Last 24 hours) at 06/21/2019 2248 Last data filed at 06/21/2019 2116 Gross per 24 hour  Intake 1723.91 ml  Output 2375 ml  Net -651.09 ml    Physical Exam:     Awake Alert, Oriented X self, place, year.   No new F.N deficits,  [place].AT, Normal respiratory effort on room air, clear breath sounds throughout  regular rate and rhythm ,No Gallops,Rubs or new Murmurs,  +ve B.Sounds, Abd Soft, No tenderness, No rebound, guarding or rigidity. No Cyanosis, abrasions on bilateral knees  I have personally reviewed the following:  Data Reviewed:  CBC Recent Labs  Lab 06/19/19 2110 06/20/19 0500 06/21/19 0440  WBC 12.2* 9.1 6.5  HGB 12.3* 11.2* 9.7*  HCT 36.4* 34.4* 29.2*  PLT 163 124* 122*  MCV 96.3 97.2 96.1  MCH 32.5 31.6 31.9  MCHC 33.8 32.6 33.2  RDW 13.4 13.2 13.6  LYMPHSABS 0.6*  --   --   MONOABS 1.1*  --   --   EOSABS 0.0  --   --   BASOSABS 0.0  --   --     Chemistries  Recent Labs  Lab 06/19/19 2110 06/20/19 0500 06/21/19 0440  NA 125* 133* 133*  K 5.7* 3.7 3.4*  CL 95* 104 106  CO2 17* 17* 19*  GLUCOSE 409* 342* 253*  BUN 56* 46* 31*  CREATININE 2.79* 2.15* 1.53*  CALCIUM 8.6* 8.2* 8.0*  MG  --   --  1.8  AST 49*  --   --   ALT 31  --   --   ALKPHOS 48  --   --   BILITOT 2.5*  --   --    ------------------------------------------------------------------------------------------------------------------ No results for input(s): CHOL, HDL, LDLCALC, TRIG, CHOLHDL, LDLDIRECT in the last 72 hours.  Lab Results   Component Value Date   HGBA1C 8.2 (A) 05/14/2019   ------------------------------------------------------------------------------------------------------------------ No results for input(s): TSH, T4TOTAL, T3FREE, THYROIDAB in the last 72 hours.  Invalid input(s): FREET3 ------------------------------------------------------------------------------------------------------------------ No results for input(s): VITAMINB12, FOLATE, FERRITIN, TIBC, IRON, RETICCTPCT in the last 72 hours.  Coagulation profile Recent Labs  Lab 06/19/19 2200  INR 2.2*    No results for input(s): DDIMER in the last 72 hours.  Cardiac Enzymes No results for input(s): CKMB, TROPONINI, MYOGLOBIN in the last 168 hours.  Invalid input(s): CK ------------------------------------------------------------------------------------------------------------------ No results found for: BNP  Micro Results Recent Results (from the past 240 hour(s))  Blood Culture (routine x 2)     Status: Abnormal (Preliminary result)   Collection Time: 06/19/19  8:43 PM   Specimen: BLOOD  Result Value Ref Range Status   Specimen Description   Final    BLOOD LEFT ANTECUBITAL Performed at [facility] [address], [place], [place] [address]    Special Requests   Final    BOTTLES DRAWN AEROBIC AND ANAEROBIC Blood Culture adequate volume Performed at [facility] [address] [place], [place] [address]    Culture  Setup Time   Final    GRAM NEGATIVE RODS IN BOTH AEROBIC AND ANAEROBIC BOTTLES CRITICAL VALUE NOTED.  VALUE IS CONSISTENT WITH PREVIOUSLY REPORTED AND CALLED VALUE. Performed at [facility], [place] [address] [place], [place] [address]    Culture KLEBSIELLA PNEUMONIAE (A)  Final   Report Status PENDING  Incomplete  Blood Culture (routine x 2)     Status: Abnormal (Preliminary result)   Collection Time: 06/19/19  8:48 PM   Specimen: BLOOD RIGHT HAND  Result Value Ref  Range Status   Specimen Description   Final    BLOOD RIGHT HAND Performed at [facility] [address], [place], [place] [address]    Special Requests   Final    BOTTLES DRAWN AEROBIC AND ANAEROBIC Blood Culture adequate volume Performed at [facility] [address], [place], [place] [address]    Culture  Setup Time   Final    GRAM NEGATIVE RODS IN BOTH AEROBIC AND ANAEROBIC BOTTLES CRITICAL RESULT CALLED TO, READ BACK BY AND VERIFIED WITH: J.  Jonell Cluck PharmD 16:25 06/20/19 (wilsonm) Performed at [facility], [place] [address], [place], [place] [address]    Culture KLEBSIELLA PNEUMONIAE (A)  Final   Report Status PENDING  Incomplete  Blood Culture ID Panel (Reflexed)     Status: Abnormal   Collection Time: 06/19/19  8:48 PM  Result Value Ref Range Status   Enterococcus species NOT DETECTED NOT DETECTED Final   Listeria monocytogenes NOT DETECTED NOT DETECTED Final   Staphylococcus species NOT DETECTED NOT DETECTED Final   Staphylococcus aureus (BCID) NOT DETECTED NOT DETECTED Final   Streptococcus species NOT DETECTED NOT DETECTED Final   Streptococcus agalactiae NOT DETECTED NOT DETECTED Final   Streptococcus pneumoniae NOT DETECTED NOT DETECTED Final   Streptococcus pyogenes NOT DETECTED NOT DETECTED Final   Acinetobacter baumannii NOT DETECTED NOT DETECTED Final   Enterobacteriaceae species DETECTED (A) NOT DETECTED Final    Comment: Enterobacteriaceae represent a large family of gram-negative bacteria, not a single organism. CRITICAL RESULT CALLED TO, READ BACK BY AND VERIFIED WITH: Christean Grief PharmD 16:25 06/20/19 (wilsonm)    Enterobacter cloacae complex NOT DETECTED NOT DETECTED Final   Escherichia coli NOT DETECTED NOT DETECTED Final   Klebsiella oxytoca NOT DETECTED NOT DETECTED Final   Klebsiella pneumoniae DETECTED (A) NOT DETECTED Final    Comment: CRITICAL RESULT CALLED TO, READ BACK BY AND VERIFIED WITH: Christean Grief PharmD  16:25 06/20/19 (wilsonm)    Proteus species NOT DETECTED NOT DETECTED Final   Serratia marcescens NOT DETECTED NOT DETECTED Final   Carbapenem resistance NOT DETECTED NOT DETECTED Final   Haemophilus influenzae NOT DETECTED NOT DETECTED Final   Neisseria meningitidis NOT DETECTED NOT DETECTED Final   Pseudomonas aeruginosa NOT DETECTED NOT DETECTED Final   Candida albicans NOT DETECTED NOT DETECTED Final   Candida glabrata NOT DETECTED NOT DETECTED Final   Candida krusei NOT DETECTED NOT DETECTED Final   Candida parapsilosis NOT DETECTED NOT DETECTED Final   Candida tropicalis NOT DETECTED NOT DETECTED Final    Comment: Performed at [facility], [place] [address] [place], [place] [address]  Urine culture     Status: Abnormal   Collection Time: 06/19/19 10:21 PM   Specimen: In/Out Cath Urine  Result Value Ref Range Status   Specimen Description   Final    IN/OUT CATH URINE Performed at [facility] [address], [place], [place] [address]    Special Requests   Final    NONE Performed at [facility], [place] [address], [place], [place] [address]    Culture MULTIPLE SPECIES PRESENT, SUGGEST RECOLLECTION (A)  Final   Report Status 06/20/2019 FINAL  Final  Respiratory Panel by RT PCR (Flu A&B, Covid) - Nasopharyngeal Swab     Status: None   Collection Time: 06/19/19 10:21 PM   Specimen: Nasopharyngeal Swab  Result Value Ref Range Status   SARS Coronavirus 2 by RT PCR NEGATIVE NEGATIVE Final    Comment: (NOTE) SARS-CoV-2 target nucleic acids are NOT DETECTED. The SARS-CoV-2 RNA is generally detectable in upper respiratoy specimens during the acute phase of infection. The lowest concentration of SARS-CoV-2 viral copies this assay can detect is 131 copies/mL. A negative result does not preclude SARS-Cov-2 infection and should not be used as the sole basis for treatment or other patient management decisions. A negative result may occur with   improper specimen collection/handling, submission of specimen other than nasopharyngeal swab, presence of viral mutation(s) within the areas targeted by this assay,  and inadequate number of viral copies (<131 copies/mL). A negative result must be combined with clinical observations, patient history, and epidemiological information. The expected result is Negative. Fact Sheet for Patients:  PinkCheek.be Fact Sheet for Healthcare Providers:  GravelBags.it This test is not yet ap proved or cleared by the [place] FDA and  has been authorized for detection and/or diagnosis of SARS-CoV-2 by FDA under an Emergency Use Authorization (EUA). This EUA will remain  in effect (meaning this test can be used) for the duration of the COVID-19 declaration under Section 564(b)(1) of the Act, 21 U.S.C. section 360bbb-3(b)(1), unless the authorization is terminated or revoked sooner.    Influenza A by PCR NEGATIVE NEGATIVE Final   Influenza B by PCR NEGATIVE NEGATIVE Final    Comment: (NOTE) The Xpert Xpress SARS-CoV-2/FLU/RSV assay is intended as an aid in  the diagnosis of influenza from Nasopharyngeal swab specimens and  should not be used as a sole basis for treatment. Nasal washings and  aspirates are unacceptable for Xpert Xpress SARS-CoV-2/FLU/RSV  testing. Fact Sheet for Patients: PinkCheek.be Fact Sheet for Healthcare Providers: GravelBags.it This test is not yet approved or cleared by the [place] FDA and  has been authorized for detection and/or diagnosis of SARS-CoV-2 by  FDA under an Emergency Use Authorization (EUA). This EUA will remain  in effect (meaning this test can be used) for the duration of the  Covid-19 declaration under Section 564(b)(1) of the Act, 21  U.S.C. section 360bbb-3(b)(1), unless the authorization is  terminated or revoked. Performed at  [facility], [place] [address], [place], [place] [address]     Radiology Reports US RENAL  Result Date: 06/20/2019 CLINICAL DATA:  Initial evaluation for acute renal insufficiency. EXAM: RENAL / URINARY TRACT ULTRASOUND COMPLETE COMPARISON:  Prior CT from 02/19/2019 FINDINGS: Right Kidney: Renal measurements: 13.9 x 6.5 x 7.1 cm = volume: 335.8 mL. Echogenicity within normal limits. No nephrolithiasis or hydronephrosis. 3.3 x 3.0 x 3.4 cm simple cyst present at the upper pole. Left Kidney: Renal measurements: 12.7 x 4.0 x 7.5 cm = volume: 294.3 mL. Echogenicity within normal limits. 6 mm echogenic focus within the lower pole the left kidney suspicious for a nonobstructive stone. No hydronephrosis. No focal renal mass. Bladder: Appears normal for degree of bladder distention. Other: None. IMPRESSION: 1. No hydronephrosis or other acute abnormality. 2. Probable 6 mm nonobstructive left renal nephrolithiasis. 3. 3.4 cm simple cyst at the upper pole of the right kidney. Electronically Signed   By: Jeannine Boga M.D.   On: 06/20/2019 01:15   DG Chest Port 1 View  Result Date: 06/19/2019 CLINICAL DATA:  Weakness and fevers EXAM: PORTABLE CHEST 1 VIEW COMPARISON:  01/03/2019 FINDINGS: Cardiac shadow is within normal limits. Tortuous thoracic aorta is again seen. Patchy bibasilar atelectasis/infiltrate is noted. No sizable effusion is seen. No bony abnormality is noted. IMPRESSION: Patchy bibasilar opacities consistent with atelectasis/early infiltrate. Electronically Signed   By: Inez Catalina M.D.   On: 06/19/2019 21:31     Time Spent in minutes  30     Desiree Hane M.D on 06/21/2019 at 10:48 PM  To page go to [email or web address] - password [facility]

## 2019-06-21 NOTE — Progress Notes (Signed)
Inpatient Diabetes Program Recommendations  AACE/ADA: New Consensus Statement on Inpatient Glycemic Control (2015)  Target Ranges:  Prepandial:   less than 140 mg/dL      Peak postprandial:   less than 180 mg/dL (1-2 hours)      Critically ill patients:  140 - 180 mg/dL   Lab Results  Component Value Date   GLUCAP 266 (H) 06/21/2019   HGBA1C 8.2 (A) 05/14/2019    Review of Glycemic Control  Diabetes history: DM2 Outpatient Diabetes medications: Lantus 50 units QHS, Victoza 1.8 mg QD, metformin 1000 mg bid Current orders for Inpatient glycemic control: Lantus 50 units QHS, Novolog 0-9 units tidwc and hs  Hgba1C - 8.2%, up from 7.6% on 11/05/18.  Inpatient Diabetes Program Recommendations:     Increase Lantus to 52 units QHS Add Novolog 4 units tidwc for meal coverage insulin, if eating > 50% meal  Follow closely.  Thank you. Lorenda Peck, RD, LDN, CDE Inpatient Diabetes Coordinator [phone]

## 2019-06-22 ENCOUNTER — Inpatient Hospital Stay (HOSPITAL_COMMUNITY): Payer: Self-pay

## 2019-06-22 DIAGNOSIS — N390 Urinary tract infection, site not specified: Secondary | ICD-10-CM

## 2019-06-22 DIAGNOSIS — A419 Sepsis, unspecified organism: Secondary | ICD-10-CM

## 2019-06-22 LAB — FERRITIN: Ferritin: 744 ng/mL — ABNORMAL HIGH (ref 24–336)

## 2019-06-22 LAB — IRON AND TIBC
Iron: 34 ug/dL — ABNORMAL LOW (ref 45–182)
Saturation Ratios: 17 % — ABNORMAL LOW (ref 17.9–39.5)
TIBC: 201 ug/dL — ABNORMAL LOW (ref 250–450)
UIBC: 167 ug/dL

## 2019-06-22 LAB — COMPREHENSIVE METABOLIC PANEL
ALT: 75 U/L — ABNORMAL HIGH (ref 0–44)
AST: 85 U/L — ABNORMAL HIGH (ref 15–41)
Albumin: 2.2 g/dL — ABNORMAL LOW (ref 3.5–5.0)
Alkaline Phosphatase: 70 U/L (ref 38–126)
Anion gap: 7 (ref 5–15)
BUN: 22 mg/dL — ABNORMAL HIGH (ref 6–20)
CO2: 18 mmol/L — ABNORMAL LOW (ref 22–32)
Calcium: 8.1 mg/dL — ABNORMAL LOW (ref 8.9–10.3)
Chloride: 110 mmol/L (ref 98–111)
Creatinine, Ser: 1.23 mg/dL (ref 0.61–1.24)
GFR calc Af Amer: >60 mL/min (ref >60)
GFR calc non Af Amer: >60 mL/min (ref >60)
Glucose, Bld: 239 mg/dL — ABNORMAL HIGH (ref 70–99)
Potassium: 3.4 mmol/L — ABNORMAL LOW (ref 3.5–5.1)
Sodium: 135 mmol/L (ref 135–145)
Total Bilirubin: 0.9 mg/dL (ref 0.3–1.2)
Total Protein: 5.8 g/dL — ABNORMAL LOW (ref 6.5–8.1)

## 2019-06-22 LAB — CULTURE, BLOOD (ROUTINE X 2)
Special Requests: ADEQUATE
Special Requests: ADEQUATE

## 2019-06-22 LAB — GLUCOSE, CAPILLARY
Glucose-Capillary: 167 mg/dL — ABNORMAL HIGH (ref 70–99)
Glucose-Capillary: 175 mg/dL — ABNORMAL HIGH (ref 70–99)
Glucose-Capillary: 233 mg/dL — ABNORMAL HIGH (ref 70–99)
Glucose-Capillary: 238 mg/dL — ABNORMAL HIGH (ref 70–99)
Glucose-Capillary: 265 mg/dL — ABNORMAL HIGH (ref 70–99)

## 2019-06-22 LAB — CBC WITH DIFFERENTIAL/PLATELET
Abs Immature Granulocytes: 0.06 10*3/uL (ref 0.00–0.07)
Basophils Absolute: 0 10*3/uL (ref 0.0–0.1)
Basophils Relative: 0 %
Eosinophils Absolute: 0.1 10*3/uL (ref 0.0–0.5)
Eosinophils Relative: 1 %
HCT: 29.5 % — ABNORMAL LOW (ref 39.0–52.0)
Hemoglobin: 9.7 g/dL — ABNORMAL LOW (ref 13.0–17.0)
Immature Granulocytes: 1 %
Lymphocytes Relative: 7 %
Lymphs Abs: 0.5 10*3/uL — ABNORMAL LOW (ref 0.7–4.0)
MCH: 32.1 pg (ref 26.0–34.0)
MCHC: 32.9 g/dL (ref 30.0–36.0)
MCV: 97.7 fL (ref 80.0–100.0)
Monocytes Absolute: 0.6 10*3/uL (ref 0.1–1.0)
Monocytes Relative: 8 %
Neutro Abs: 6.1 10*3/uL (ref 1.7–7.7)
Neutrophils Relative %: 83 %
Platelets: 130 10*3/uL — ABNORMAL LOW (ref 150–400)
RBC: 3.02 MIL/uL — ABNORMAL LOW (ref 4.22–5.81)
RDW: 13.9 % (ref 11.5–15.5)
WBC: 7.4 10*3/uL (ref 4.0–10.5)
nRBC: 0 % (ref 0.0–0.2)

## 2019-06-22 LAB — BASIC METABOLIC PANEL
Anion gap: 7 (ref 5–15)
BUN: 22 mg/dL — ABNORMAL HIGH (ref 6–20)
CO2: 18 mmol/L — ABNORMAL LOW (ref 22–32)
Calcium: 7.9 mg/dL — ABNORMAL LOW (ref 8.9–10.3)
Chloride: 108 mmol/L (ref 98–111)
Creatinine, Ser: 1.22 mg/dL (ref 0.61–1.24)
GFR calc Af Amer: >60 mL/min (ref >60)
GFR calc non Af Amer: >60 mL/min (ref >60)
Glucose, Bld: 275 mg/dL — ABNORMAL HIGH (ref 70–99)
Potassium: 3.8 mmol/L (ref 3.5–5.1)
Sodium: 133 mmol/L — ABNORMAL LOW (ref 135–145)

## 2019-06-22 LAB — BRAIN NATRIURETIC PEPTIDE: B Natriuretic Peptide: 147.2 pg/mL — ABNORMAL HIGH (ref 0.0–100.0)

## 2019-06-22 LAB — BLOOD GAS, ARTERIAL
Acid-base deficit: 4.1 mmol/L — ABNORMAL HIGH (ref 0.0–2.0)
Bicarbonate: 17.8 mmol/L — ABNORMAL LOW (ref 20.0–28.0)
FIO2: 21
O2 Saturation: 92.3 %
Patient temperature: 98.6
pCO2 arterial: 23.1 mmHg — ABNORMAL LOW (ref 32.0–48.0)
pH, Arterial: 7.499 — ABNORMAL HIGH (ref 7.350–7.450)
pO2, Arterial: 62.3 mmHg — ABNORMAL LOW (ref 83.0–108.0)

## 2019-06-22 LAB — CBC
HCT: 28.9 % — ABNORMAL LOW (ref 39.0–52.0)
Hemoglobin: 9.8 g/dL — ABNORMAL LOW (ref 13.0–17.0)
MCH: 32.2 pg (ref 26.0–34.0)
MCHC: 33.9 g/dL (ref 30.0–36.0)
MCV: 95.1 fL (ref 80.0–100.0)
Platelets: 122 10*3/uL — ABNORMAL LOW (ref 150–400)
RBC: 3.04 MIL/uL — ABNORMAL LOW (ref 4.22–5.81)
RDW: 13.7 % (ref 11.5–15.5)
WBC: 7.1 10*3/uL (ref 4.0–10.5)
nRBC: 0 % (ref 0.0–0.2)

## 2019-06-22 LAB — MRSA PCR SCREENING: MRSA by PCR: NEGATIVE

## 2019-06-22 LAB — VITAMIN B12: Vitamin B-12: 295 pg/mL (ref 180–914)

## 2019-06-22 LAB — LACTIC ACID, PLASMA: Lactic Acid, Venous: 1.6 mmol/L (ref 0.5–1.9)

## 2019-06-22 MED ORDER — LEVALBUTEROL HCL 0.63 MG/3ML IN NEBU
0.6300 mg | INHALATION_SOLUTION | Freq: Four times a day (QID) | RESPIRATORY_TRACT | Status: DC | PRN
  Administered 2019-06-22: 0.63 mg via RESPIRATORY_TRACT
  Filled 2019-06-22 (×2): qty 3

## 2019-06-22 MED ORDER — POTASSIUM CHLORIDE 10 MEQ/100ML IV SOLN
10.0000 meq | INTRAVENOUS | Status: AC
  Administered 2019-06-22 – 2019-06-23 (×2): 10 meq via INTRAVENOUS
  Filled 2019-06-22 (×2): qty 100

## 2019-06-22 MED ORDER — ORAL CARE MOUTH RINSE
15.0000 mL | Freq: Two times a day (BID) | OROMUCOSAL | Status: DC
  Administered 2019-06-22 – 2019-06-25 (×5): 15 mL via OROMUCOSAL

## 2019-06-22 MED ORDER — SODIUM CHLORIDE 0.9 % IV SOLN
2.0000 g | INTRAVENOUS | Status: DC
  Administered 2019-06-22 – 2019-06-23 (×2): 2 g via INTRAVENOUS
  Filled 2019-06-22: qty 20
  Filled 2019-06-22 (×2): qty 2

## 2019-06-22 MED ORDER — SODIUM CHLORIDE 0.9 % IV SOLN
3.0000 g | Freq: Four times a day (QID) | INTRAVENOUS | Status: DC
  Administered 2019-06-22: 3 g via INTRAVENOUS
  Filled 2019-06-22: qty 3
  Filled 2019-06-22: qty 8

## 2019-06-22 MED ORDER — SODIUM CHLORIDE 0.9 % IV SOLN: INTRAVENOUS | Status: DC

## 2019-06-22 NOTE — Consult Note (Signed)
NAME:  Shane Bond, MRN:  [id number], DOB:  07-Dec-1960, LOS: 3 ADMISSION DATE:  06/19/2019, CONSULTATION DATE: 06/22/2018 REFERRING MD: Dr. Vallery Ridge, CHIEF COMPLAINT: Sepsis  Brief History   59 year old gentleman was admitted with weakness Was found to have Klebsiella pneumonia bacteremia  History of present illness   Transferred to ICU for sepsis High fever, hypoxemia, tachypnea, tachycardia  3-day history of generalized weakness prior to coming to the hospital Has a history of neurogenic bladder for which he normally self catheterizes-this resulted from back surgery about 1999 Has been having some difficulty catheterizing himself History of chronic cough  Past Medical History   Past Medical History:  Diagnosis Date  . 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 Bladder atonia   . BPH (benign prostatic hyperplasia)   . CAD, NATIVE VESSEL 10/29/2009  . Diabetes mellitus type 2, uncontrolled, with complications ([place])   . Hyperlipidemia LDL goal <70   . Kidney stone   . OBESITY 10/22/2009     Significant Hospital Events   Fever 103.4  Consults:  PCCM 06/22/2019  Procedures:  None  Significant Diagnostic Tests:  Kidney ultrasound IMPRESSION: 1. No hydronephrosis or other acute abnormality. 2. Probable 6 mm nonobstructive left renal nephrolithiasis. 3. 3.4 cm simple cyst at the upper pole of the right kidney.   Chest x-ray No acute abnormality Micro Data:  Blood cultures, urine cultures-Klebsiella pneumonia  Antimicrobials:  Rocephin 4/28>> Azithromycin 4/29>> Interim history/subjective:  High fever today, tachycardia, tachypnea  Objective   Blood pressure 122/77, pulse (!) 146, temperature (!) 100.7 F (38.2 C), temperature source Axillary, resp. rate (!) 48, height 6\' 2"  (1.88 m), weight 118.3 kg, SpO2 98 %.        Intake/Output Summary (Last 24 hours) at 06/22/2019 1745 Last data filed at 06/22/2019 1506 Gross per 24 hour  Intake 1071.08 ml  Output 3425 ml  Net  -2353.92 ml   Filed Weights   06/20/19 1613 06/21/19 0500 06/22/19 0439  Weight: 108.8 kg 115.8 kg 118.3 kg    Examination: General: Middle-age gentleman, does not appear to be in respiratory distress, tachycardic HENT: Dry oral mucosa Lungs: Fair air entry bilaterally, no added sounds Cardiovascular: S1-S2 appreciated Abdomen: Bowel sounds appreciated Extremities: No pain, no edema Neuro: Awake and alert, some confusion GU:   Resolved Hospital Problem list     Assessment & Plan:  Sepsis secondary to Klebsiella bacteremia -Organism is sensitive to Rocephin -Will switch back to Rocephin 2 g daily  Possible community-acquired pneumonia -Azithromycin added to antibiotic coverage  Syncopal episode -Possibly secondary to dehydration, hypotension  Acute kidney injury -Continue to monitor closely -Avoid nephrotoxic's -Fluid resuscitation  Type 2 diabetes -Continue insulin  Swallow evaluation noted -No significant aspiration noted  Fluid resuscitation -Normal saline at 125 cc an hour -Initially dehydrated and with high fevers  Leukocytosis is improving on current antibiotic therapy-was on Rocephin Best practice:  Diet: As tolerated Pain/Anxiety/Delirium protocol (if indicated): Not indicated VAP protocol (if indicated): Not indicated DVT prophylaxis: Heparin Code Status: Full code Family Communication: Patient updated Disposition: Stepdown unit  Labs   CBC: Recent Labs  Lab 06/19/19 2110 06/20/19 0500 06/21/19 0440 06/22/19 0535  WBC 12.2* 9.1 6.5 7.1  NEUTROABS 10.3*  --   --   --   HGB 12.3* 11.2* 9.7* 9.8*  HCT 36.4* 34.4* 29.2* 28.9*  MCV 96.3 97.2 96.1 95.1  PLT 163 124* 122* 122*    Basic Metabolic Panel: Recent Labs  Lab 06/19/19 2110 06/20/19 0500 06/21/19 0440  06/22/19 0535  NA 125* 133* 133* 133*  K 5.7* 3.7 3.4* 3.8  CL 95* 104 106 108  CO2 17* 17* 19* 18*  GLUCOSE 409* 342* 253* 275*  BUN 56* 46* 31* 22*  CREATININE 2.79* 2.15*  1.53* 1.22  CALCIUM 8.6* 8.2* 8.0* 7.9*  MG  --   --  1.8  --    GFR: Estimated Creatinine Clearance: 90.2 mL/min (by C-G formula based on SCr of 1.22 mg/dL). Recent Labs  Lab 06/19/19 2110 06/19/19 2243 06/20/19 0500 06/21/19 0440 06/22/19 0535  WBC 12.2*  --  9.1 6.5 7.1  LATICACIDVEN 2.1* 1.9  --   --   --     Liver Function Tests: Recent Labs  Lab 06/19/19 2110  AST 49*  ALT 31  ALKPHOS 48  BILITOT 2.5*  PROT 7.0  ALBUMIN 3.4*   No results for input(s): LIPASE, AMYLASE in the last 168 hours. No results for input(s): AMMONIA in the last 168 hours.  ABG    Component Value Date/Time   HCO3 23.8 11/21/2014 1522   TCO2 21.1 11/21/2014 1522   ACIDBASEDEF 0.7 11/21/2014 1522   O2SAT 60.4 11/21/2014 1522     Coagulation Profile: Recent Labs  Lab 06/19/19 2200  INR 2.2*    Cardiac Enzymes: No results for input(s): CKTOTAL, CKMB, CKMBINDEX, TROPONINI in the last 168 hours.  HbA1C: Hemoglobin A1C  Date/Time Value Ref Range Status  05/14/2019 11:05 AM 8.2 (A) 4.0 - 5.6 % Final  11/05/2018 03:27 PM 7.6 (A) 4.0 - 5.6 % Final   Hgb A1c MFr Bld  Date/Time Value Ref Range Status  04/07/2017 02:53 PM 10.8 (H) 4.8 - 5.6 % Final    Comment:             Prediabetes: 5.7 - 6.4          Diabetes: >6.4          Glycemic control for adults with diabetes: <7.0     CBG: Recent Labs  Lab 06/21/19 0743 06/21/19 1212 06/21/19 2012 06/22/19 0725 06/22/19 1143  GLUCAP 201* 266* 202* 238* 265*    Review of Systems:   Feeling much better at present  Past Medical History  He,  has a past medical history of AMI, INFERIOR WALL (10/22/2009), Bladder atonia, BPH (benign prostatic hyperplasia), CAD, NATIVE VESSEL (10/29/2009), Diabetes mellitus type 2, uncontrolled, with complications ([place]), Hyperlipidemia LDL goal <70, Kidney stone, and OBESITY (10/22/2009).   Surgical History    Past Surgical History:  Procedure Laterality Date  . BACK SURGERY    . CORONARY STENT  PLACEMENT    . CYSTOSCOPY W/ URETERAL STENT PLACEMENT  02/26/2012   Procedure: CYSTOSCOPY WITH RETROGRADE PYELOGRAM/URETERAL STENT PLACEMENT;  Surgeon: Bernestine Amass, MD;  Location: [facility];  Service: Urology;  Laterality: Left;     Social History   reports that he has been smoking e-cigarettes. He has quit using smokeless tobacco. He reports that he does not drink alcohol or use drugs.   Family History   His family history includes Coronary artery disease in an other family member; Diabetes in his father and paternal aunt; Heart attack in an other family member.   Allergies Allergies  Allergen Reactions  . Lipitor [Atorvastatin] Rash    The patient is critically ill with multiple organ systems failure and requires high complexity decision making for assessment and support, frequent evaluation and titration of therapies, application of advanced monitoring technologies and extensive interpretation of multiple databases. Critical Care Time  devoted to patient care services described in this note independent of APP/resident time (if applicable)  is 30 minutes.   Sherrilyn Rist MD [facility] Personal pager: [phone] If unanswered, please page CCM On-call: [phone]

## 2019-06-22 NOTE — Progress Notes (Signed)
Paged [facility] Lovey Newcomer, NP) about patient's elevated RR rate (in the 30's). New orders: levalbuterol nebulizer solution 0.63 mg q6hr for wheezing, SOB.  2 L of 02 was put on the patient (per MD PRN order) prior to paging [facility]. Will call respiratory to give patient a breathing treatment. Will continue to monitor the patient.

## 2019-06-22 NOTE — Evaluation (Signed)
Clinical/Bedside Swallow Evaluation Patient Details  Name: Shane Bond MRN: [id number] Date of Birth: August 27, 1960  Today's Date: 06/22/2019 Time: SLP Start Time (ACUTE ONLY): 1505 SLP Stop Time (ACUTE ONLY): 1532 SLP Time Calculation (min) (ACUTE ONLY): 27 min  Past Medical History:  Past Medical History:  Diagnosis Date  . 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 Bladder atonia   . BPH (benign prostatic hyperplasia)   . CAD, NATIVE VESSEL 10/29/2009  . Diabetes mellitus type 2, uncontrolled, with complications ([place])   . Hyperlipidemia LDL goal <70   . Kidney stone   . OBESITY 10/22/2009   Past Surgical History:  Past Surgical History:  Procedure Laterality Date  . BACK SURGERY    . CORONARY STENT PLACEMENT    . CYSTOSCOPY W/ URETERAL STENT PLACEMENT  02/26/2012   Procedure: CYSTOSCOPY WITH RETROGRADE PYELOGRAM/URETERAL STENT PLACEMENT;  Surgeon: Bernestine Amass, MD;  Location: [facility];  Service: Urology;  Laterality: Left;   HPI:  Shane Bond, 59y/f, presened to ED with sepsis due to UTI. PMH of HTN, DM, HLD, Neurogenic bladder post back sx/BPH with intermittent self catheterization, AKI, hyperkalemia, hyperglycemia, developed SOB requiring 2L [place] on 06/21/19. Chest x-ray showed atelectasis or early bilateral infiltrates. No history of swallowing problems.    Assessment / Plan / Recommendation Clinical Impression  Clinical swallow evaluation completed at bedside. Pt has no h/o dysphagia. He reports ocassional reflux, but does not require medication to control. Oral motor exam was unremarkable. Trials of ice chips, water, puree and cracker given. Various bolus sizes were tolerated with no s/s of aspiration. A 3 oz Yale swallow test was given, no coughing, throat clear or stopping noted. Good mastication and bolus control with all solids. Recommend pt continue regular diet consistencies with thin liquids. Pt presents with functional swallow. No further ST needs at this time.  SLP Visit  Diagnosis: Dysphagia, unspecified (R13.10)    Aspiration Risk  No limitations    Diet Recommendation Regular;Thin liquid   Liquid Administration via: Cup;Straw Medication Administration: Whole meds with liquid Supervision: Patient able to self feed    Other  Recommendations Oral Care Recommendations: Oral care BID   Follow up Recommendations None                  Prognosis Prognosis for Safe Diet Advancement: Good      Swallow Study   General Date of Onset: 06/21/19 HPI: Shane Bond, 59y/f, presened to ED with sepsis due to UTI. PMH of HTN, DM, HLD, Neurogenic bladder post back sx/BPH with intermittent self catheterization, AKI, hyperkalemia, hyperglycemia, developed SOB requiring 2L [place] on 06/21/19. Chest x-ray showed atelectasis or early bilateral infiltrates. No history of swallowing problems.  Type of Study: Bedside Swallow Evaluation Previous Swallow Assessment: none Diet Prior to this Study: Regular;Thin liquids Temperature Spikes Noted: Yes(on admission 100.4) Respiratory Status: Nasal cannula History of Recent Intubation: No Behavior/Cognition: Alert;Cooperative;Pleasant mood Oral Cavity Assessment: Within Functional Limits Oral Care Completed by SLP: No Oral Cavity - Dentition: Adequate natural dentition Vision: Functional for self-feeding Self-Feeding Abilities: Able to feed self Patient Positioning: Upright in chair Baseline Vocal Quality: Normal Volitional Cough: Strong Volitional Swallow: Able to elicit    Oral/Motor/Sensory Function Overall Oral Motor/Sensory Function: Within functional limits   Ice Chips Ice chips: Within functional limits Presentation: Spoon   Thin Liquid Thin Liquid: Within functional limits Presentation: Cup;Straw    Nectar Thick Nectar Thick Liquid: Not tested   Honey Thick Honey Thick Liquid: Not  tested   Puree Puree: Within functional limits   Solid     Solid: Within functional limits      Wynelle Bourgeois., MA  CCC-SLP 06/22/2019,3:46 PM

## 2019-06-22 NOTE — Progress Notes (Signed)
   06/22/19 1645  Assess: MEWS Score  Temp (!) 103.4 F (39.7 C)  Pulse Rate (!) 146  Resp (!) 48  Level of Consciousness Alert  SpO2 (!) 83 %  O2 Device Room Air  Assess: MEWS Score  MEWS Temp 2  MEWS Systolic 0  MEWS Pulse 3  MEWS RR 3  MEWS LOC 0  MEWS Score 8  MEWS Score Color Red  Assess: if the MEWS score is Yellow or Red  Were vital signs taken at a resting state? Yes  Focused Assessment Documented focused assessment  Early Detection of Sepsis Score *See Row Information* Medium  MEWS guidelines implemented *See Row Information* No, other (Comment) (Pt transferred to stepdown)  Treat  MEWS Interventions Administered prn meds/treatments;Other (Comment) (RRT called)  Notify: Charge Nurse/RN  Name of Charge Nurse/RN Notified Abby P  Date Charge Nurse/RN Notified 06/22/19  Time Charge Nurse/RN Notified [id number]  Notify: Provider  Provider Name/Title MD Mujtaba  Date Provider Notified 06/22/19  Time Provider Notified 1650  Notification Type Page  Notification Reason Change in status  Response See new orders  Date of Provider Response 06/22/19  Time of Provider Response 1651  Notify: Rapid Response  Name of Rapid Response RN Notified Christian  Date Rapid Response Notified 06/22/19  Time Rapid Response Notified [id number]  Document  Patient Outcome Transferred/level of care increased  Progress note created (see row info) Yes   This RN entered room to find patient tremoring, with increased respirations, mottling to his extremities, and eyes rolling back into head. AOx4. . Vital signs obtained, see above. Unable to obtain accurate BP due to tremoring. O2 applied, MD and RRT made aware. MD ordered patient transfer to stepdown.

## 2019-06-22 NOTE — Progress Notes (Signed)
   06/22/19 1026  Assess: MEWS Score  Temp 98.2 F (36.8 C)  BP 107/63  Pulse Rate 83  Resp (!) 26  SpO2 97 %  O2 Device Room Air  Assess: MEWS Score  MEWS Temp 0  MEWS Systolic 0  MEWS Pulse 0  MEWS RR 2  MEWS LOC 0  MEWS Score 2  MEWS Score Color Yellow  Assess: if the MEWS score is Yellow or Red  Were vital signs taken at a resting state? Yes  Focused Assessment Documented focused assessment  Early Detection of Sepsis Score *See Row Information* Medium  MEWS guidelines implemented *See Row Information* No, other (Comment) (RR not an acute change.)  Notify: Charge Nurse/RN  Name of Charge Nurse/RN Notified Abby P  Date Charge Nurse/RN Notified 06/22/19  Time Charge Nurse/RN Notified 1032  Document  Patient Outcome Other (Comment) (baseline for pt)  Progress note created (see row info) Yes    RR baseline for this patient since admission. MEWS protocol not implemented. Will continue to monitor patient.

## 2019-06-22 NOTE — Significant Event (Signed)
Rapid Response Event Note  Overview: Time Called: [id number] Arrival Time: 1700 Event Type: Other (Comment)   Notified by 4th floor bedside RN that patient was shaking intensively. Upon arrival, patient's upper forearms and feet were mottling. Rectal temperature taken was 103.4 F, patient was tachycardic and tachypnea. Patient oriented x 4. Ordered for the rectal tylenol to be given.   Initial Focused Assessment:   Interventions: Neuro: Patient alert and oriented x 4 and able to follow simple commands. Patient appeared to be having rigors from elevated temperature. Cardiac: Sinus Tachy 120s HR,  BP within normal limits.  Pulmonary: RR 30s-40s but no respiratory distress present, O2 Sats 98-100% on 6LNC originally-decreased this to 2LNC-patient still 98-100%   Plan of Care (if not transferred): Tylenol given rectally  MD Mujtaba at bedside  -Transfer to SDU for closer monitor        Foye Haggart C

## 2019-06-22 NOTE — Progress Notes (Signed)
PROGRESS NOTE    Shane Bond  [id number] DOB: December 21, 1960 DOA: 06/19/2019 PCP: Gildardo Pounds, NP   Brief Narrative:  59 y/o M with HTN, DM, hypertriglyceridemia, Neurogenic bladder post Back Sx/BPH with intermittent self catheterization p/w Urospesis complicated by AKI, hyperkalemia, hyperglycemia.   On 06/21/19 developed SOB requiring 2 LNC.    Assessment & Plan:   Principal Problem:   Sepsis due to urinary tract infection (Shane Bond) Active Problems:   CAD, NATIVE VESSEL   Hypertension associated with diabetes (Shane Bond)   Hyperlipidemia associated with type 2 diabetes mellitus (Shane Bond)   BPH (benign prostatic hyperplasia)   Self-catheterizes urinary bladder   Type 2 diabetes mellitus with hyperglycemia, with long-term current use of insulin (Shane Bond)   Hyperkalemia   Acute kidney injury (Shane Bond)   Lung infiltrate   Thrombocytopenia (Shane Bond)   Normocytic anemia   Bacteremia due to Klebsiella pneumoniae   #Sepsis secondary to Klebsiella UTI with Klebsiella bacteremia leukocytosis has resolved, still tachycardic and tachypneic, febrile on admission, with UA concerning for UTI and patient has history of at home, renal ultrasound shows nonobstructive 6 mm stone with no hydronephrosis.   Chest x-ray also mentions infiltrates that could represent either atelectasis versus early pneumonia.  Sepsis physiology has resolved  -Continue Unasyn/azithromycin to cover CAP/aspiration pneumonitis/UTI, follow urine/blood culture sensitivities  -Incentive spirometry -Repeat blood cultures, to ensure clearance of bacteremia    #Bilateral infiltrates, concern for community-acquired pneumonia. -Suspect aspiration given worsening with food intake. -Speech evaluation. -We will change ceftriaxone to Unasyn to cover anaerobes.  Continue with azithromycin for CAP coverage. -DC'd IV fluids. -Awaiting chest x-ray, BMP results. Covid test negative.   -Aspiration precautions -Incentive spirometry  #Syncopal  episodes.  Likely in the setting of sepsis, dehydration.  Reports worsening weakness over several days and falls at home..  Abrasions on bilateral knees.  Patient following commands, no focal deficits.  No longer endorsing back pain -Monitor  #AKI with hyperkalemia.   -Likely secondary to sepsis/post renal failure. -Continue IV fluids x24 hours and reassess, continue to encourage oral hydration -Holding home lisinopril    #Type 2 diabetes with hyperglycemia, A1c 8.2.  Likely worsening control in the setting of infection.  FBG's in the 260s -Holding home Metformin, Victoza -Continue Lantus 50 units, home dose -Sliding scale as needed, monitor CBGs -Add NovoLog 4 units 3 times daily with meal coverage  Hypertension presented with hypotension.  SBP range 97/60-104/63 -Continue IV fluids x24 hours and reassess -Holding home lisinopril due to hyperkalemia and relative hypotension on presentation. -Continue to closely monitor   #Normocytic anemia.  Prior hemoglobin 10.7 on 01/2019.  12.3 admission, slowly downtrending, currently 9.7.  No localizing signs or symptoms of bleeding. -Repeat CBC in a.m. -Check iron panel  #Thrombocytopenia, new.  Previous platelet count normal limits, has downtrending from 163-122 today.  Again no signs or symptoms of bleeding.  Currently on subQ heparin -Monitor CBC in a.m.   DVT prophylaxis: Heparin.  Code Status: full   Family Communication: None.   Disposition Plan:  Status is: Inpatient  Remains inpatient appropriate because:Inpatient level of care appropriate due to severity of illness   Dispo: The patient is from: Home              Anticipated d/c is to: SNF              Anticipated d/c date is: 3 days              Patient currently is not medically  stable to d/c.    Consultants:   None     Procedures:  None.   Antimicrobials: Unasyn/azithromycin.      Subjective: Overnight patient with RR in 89s. Placed on [facility] with  duonebs.  He reports a cough every time he eats    Objective: Vitals:   06/22/19 0439 06/22/19 0516 06/22/19 0803 06/22/19 1026  BP:   109/66 107/63  Pulse:   76 83  Resp:   (!) 24 (!) 26  Temp:   98 F (36.7 C) 98.2 F (36.8 C)  TempSrc:   Oral Oral  SpO2:  94% 93% 97%  Weight: 118.3 kg     Height:        Intake/Output Summary (Last 24 hours) at 06/22/2019 1343 Last data filed at 06/22/2019 1212 Gross per 24 hour  Intake 1071.08 ml  Output 3750 ml  Net -2678.92 ml   Filed Weights   06/20/19 1613 06/21/19 0500 06/22/19 0439  Weight: 108.8 kg 115.8 kg 118.3 kg    Examination:  General exam: Appears calm and comfortable  Respiratory system: Clear to auscultation. Respiratory effort normal. Cardiovascular system: S1 & S2 heard, RRR. No JVD, murmurs, rubs, gallops or clicks. No pedal edema. Gastrointestinal system: Abdomen is nondistended, soft and nontender. No organomegaly or masses felt. Normal bowel sounds heard. Central nervous system: Alert and oriented. No focal neurological deficits. Extremities: Symmetric 5 x 5 power. Skin: No rashes, lesions or ulcers Psychiatry: Judgement and insight appear normal. Mood & affect appropriate.     Data Reviewed: I have personally reviewed following labs and imaging studies  CBC: Recent Labs  Lab 06/19/19 2110 06/20/19 0500 06/21/19 0440 06/22/19 0535  WBC 12.2* 9.1 6.5 7.1  NEUTROABS 10.3*  --   --   --   HGB 12.3* 11.2* 9.7* 9.8*  HCT 36.4* 34.4* 29.2* 28.9*  MCV 96.3 97.2 96.1 95.1  PLT 163 124* 122* 123XX123*   Basic Metabolic Panel: Recent Labs  Lab 06/19/19 2110 06/20/19 0500 06/21/19 0440 06/22/19 0535  NA 125* 133* 133* 133*  K 5.7* 3.7 3.4* 3.8  CL 95* 104 106 108  CO2 17* 17* 19* 18*  GLUCOSE 409* 342* 253* 275*  BUN 56* 46* 31* 22*  CREATININE 2.79* 2.15* 1.53* 1.22  CALCIUM 8.6* 8.2* 8.0* 7.9*  MG  --   --  1.8  --    GFR: Estimated Creatinine Clearance: 90.2 mL/min (by C-G formula based on SCr of  1.22 mg/dL). Liver Function Tests: Recent Labs  Lab 06/19/19 2110  AST 49*  ALT 31  ALKPHOS 48  BILITOT 2.5*  PROT 7.0  ALBUMIN 3.4*   No results for input(s): LIPASE, AMYLASE in the last 168 hours. No results for input(s): AMMONIA in the last 168 hours. Coagulation Profile: Recent Labs  Lab 06/19/19 2200  INR 2.2*   Cardiac Enzymes: No results for input(s): CKTOTAL, CKMB, CKMBINDEX, TROPONINI in the last 168 hours. BNP (last 3 results) No results for input(s): PROBNP in the last 8760 hours. HbA1C: No results for input(s): HGBA1C in the last 72 hours. CBG: Recent Labs  Lab 06/21/19 0743 06/21/19 1212 06/21/19 2012 06/22/19 0725 06/22/19 1143  GLUCAP 201* 266* 202* 238* 265*   Lipid Profile: No results for input(s): CHOL, HDL, LDLCALC, TRIG, CHOLHDL, LDLDIRECT in the last 72 hours. Thyroid Function Tests: No results for input(s): TSH, T4TOTAL, FREET4, T3FREE, THYROIDAB in the last 72 hours. Anemia Panel: Recent Labs    06/22/19 Robert Lee  744*  TIBC 201*  IRON 34*   Sepsis Labs: Recent Labs  Lab 06/19/19 2110 06/19/19 2243  LATICACIDVEN 2.1* 1.9    Recent Results (from the past 240 hour(s))  Blood Culture (routine x 2)     Status: Abnormal   Collection Time: 06/19/19  8:43 PM   Specimen: BLOOD  Result Value Ref Range Status   Specimen Description   Final    BLOOD LEFT ANTECUBITAL Performed at [facility], [place] [address] [place], [place] [address]    Special Requests   Final    BOTTLES DRAWN AEROBIC AND ANAEROBIC Blood Culture adequate volume Performed at [facility] [address] [place], [place] [address]    Culture  Setup Time   Final    GRAM NEGATIVE RODS IN BOTH AEROBIC AND ANAEROBIC BOTTLES CRITICAL VALUE NOTED.  VALUE IS CONSISTENT WITH PREVIOUSLY REPORTED AND CALLED VALUE.    Culture (A)  Final    KLEBSIELLA PNEUMONIAE SUSCEPTIBILITIES PERFORMED ON PREVIOUS CULTURE WITHIN  THE LAST 5 DAYS. Performed at [facility], [place] [address] [place], [place] [address]    Report Status 06/22/2019 FINAL  Final  Blood Culture (routine x 2)     Status: Abnormal   Collection Time: 06/19/19  8:48 PM   Specimen: BLOOD RIGHT HAND  Result Value Ref Range Status   Specimen Description   Final    BLOOD RIGHT HAND Performed at [facility] [address] [place], [place] [address]    Special Requests   Final    BOTTLES DRAWN AEROBIC AND ANAEROBIC Blood Culture adequate volume Performed at [facility] [address], [place], [place] [address]    Culture  Setup Time   Final    GRAM NEGATIVE RODS IN BOTH AEROBIC AND ANAEROBIC BOTTLES CRITICAL RESULT CALLED TO, READ BACK BY AND VERIFIED WITH: Christean Grief PharmD 16:25 06/20/19 ([email or web address]) Performed at [facility], [place] [address] [place], [place] [address]    Culture KLEBSIELLA PNEUMONIAE (A)  Final   Report Status 06/22/2019 FINAL  Final   Organism ID, Bacteria KLEBSIELLA PNEUMONIAE  Final      Susceptibility   Klebsiella pneumoniae - MIC*    AMPICILLIN >=32 RESISTANT Resistant     CEFAZOLIN <=4 SENSITIVE Sensitive     CEFEPIME <=1 SENSITIVE Sensitive     CEFTAZIDIME <=1 SENSITIVE Sensitive     CEFTRIAXONE <=1 SENSITIVE Sensitive     CIPROFLOXACIN <=0.25 SENSITIVE Sensitive     GENTAMICIN <=1 SENSITIVE Sensitive     IMIPENEM <=0.25 SENSITIVE Sensitive     TRIMETH/SULFA 80 RESISTANT Resistant     AMPICILLIN/SULBACTAM 8 SENSITIVE Sensitive     PIP/TAZO <=4 SENSITIVE Sensitive     * KLEBSIELLA PNEUMONIAE  Blood Culture ID Panel (Reflexed)     Status: Abnormal   Collection Time: 06/19/19  8:48 PM  Result Value Ref Range Status   Enterococcus species NOT DETECTED NOT DETECTED Final   Listeria monocytogenes NOT DETECTED NOT DETECTED Final   Staphylococcus species NOT DETECTED NOT DETECTED Final   Staphylococcus aureus (BCID) NOT DETECTED NOT DETECTED Final   Streptococcus  species NOT DETECTED NOT DETECTED Final   Streptococcus agalactiae NOT DETECTED NOT DETECTED Final   Streptococcus pneumoniae NOT DETECTED NOT DETECTED Final   Streptococcus pyogenes NOT DETECTED NOT DETECTED Final   Acinetobacter baumannii NOT DETECTED NOT DETECTED Final   Enterobacteriaceae species DETECTED (A) NOT DETECTED Final    Comment: Enterobacteriaceae represent  a large family of gram-negative bacteria, not a single organism. CRITICAL RESULT CALLED TO, READ BACK BY AND VERIFIED WITH: Christean Grief PharmD 16:25 06/20/19 ([email or web address])    Enterobacter cloacae complex NOT DETECTED NOT DETECTED Final   Escherichia coli NOT DETECTED NOT DETECTED Final   Klebsiella oxytoca NOT DETECTED NOT DETECTED Final   Klebsiella pneumoniae DETECTED (A) NOT DETECTED Final    Comment: CRITICAL RESULT CALLED TO, READ BACK BY AND VERIFIED WITH: Christean Grief PharmD 16:25 06/20/19 (wilsonm)    Proteus species NOT DETECTED NOT DETECTED Final   Serratia marcescens NOT DETECTED NOT DETECTED Final   Carbapenem resistance NOT DETECTED NOT DETECTED Final   Haemophilus influenzae NOT DETECTED NOT DETECTED Final   Neisseria meningitidis NOT DETECTED NOT DETECTED Final   Pseudomonas aeruginosa NOT DETECTED NOT DETECTED Final   Candida albicans NOT DETECTED NOT DETECTED Final   Candida glabrata NOT DETECTED NOT DETECTED Final   Candida krusei NOT DETECTED NOT DETECTED Final   Candida parapsilosis NOT DETECTED NOT DETECTED Final   Candida tropicalis NOT DETECTED NOT DETECTED Final    Comment: Performed at [facility], [place] [address], [place], [place] [address]  Urine culture     Status: Abnormal   Collection Time: 06/19/19 10:21 PM   Specimen: In/Out Cath Urine  Result Value Ref Range Status   Specimen Description   Final    IN/OUT CATH URINE Performed at [facility] [address], [place], [place] [address]    Special Requests   Final    NONE Performed at [facility],  [place] [address] [place], [place] [address]    Culture MULTIPLE SPECIES PRESENT, SUGGEST RECOLLECTION (A)  Final   Report Status 06/20/2019 FINAL  Final  Respiratory Panel by RT PCR (Flu A&B, Covid) - Nasopharyngeal Swab     Status: None   Collection Time: 06/19/19 10:21 PM   Specimen: Nasopharyngeal Swab  Result Value Ref Range Status   SARS Coronavirus 2 by RT PCR NEGATIVE NEGATIVE Final    Comment: (NOTE) SARS-CoV-2 target nucleic acids are NOT DETECTED. The SARS-CoV-2 RNA is generally detectable in upper respiratoy specimens during the acute phase of infection. The lowest concentration of SARS-CoV-2 viral copies this assay can detect is 131 copies/mL. A negative result does not preclude SARS-Cov-2 infection and should not be used as the sole basis for treatment or other patient management decisions. A negative result may occur with  improper specimen collection/handling, submission of specimen other than nasopharyngeal swab, presence of viral mutation(s) within the areas targeted by this assay, and inadequate number of viral copies (<131 copies/mL). A negative result must be combined with clinical observations, patient history, and epidemiological information. The expected result is Negative. Fact Sheet for Patients:  PinkCheek.be Fact Sheet for Healthcare Providers:  GravelBags.it This test is not yet ap proved or cleared by the [place] FDA and  has been authorized for detection and/or diagnosis of SARS-CoV-2 by FDA under an Emergency Use Authorization (EUA). This EUA will remain  in effect (meaning this test can be used) for the duration of the COVID-19 declaration under Section 564(b)(1) of the Act, 21 U.S.C. section 360bbb-3(b)(1), unless the authorization is terminated or revoked sooner.    Influenza A by PCR NEGATIVE NEGATIVE Final   Influenza B by PCR NEGATIVE NEGATIVE Final    Comment: (NOTE) The Xpert  Xpress SARS-CoV-2/FLU/RSV assay is intended as an aid in  the diagnosis of influenza from Nasopharyngeal swab specimens and  should not be used as a sole basis for treatment. Nasal washings and  aspirates are unacceptable for Xpert Xpress SARS-CoV-2/FLU/RSV  testing. Fact Sheet for Patients: PinkCheek.be Fact Sheet for Healthcare Providers: GravelBags.it This test is not yet approved or cleared by the [place] FDA and  has been authorized for detection and/or diagnosis of SARS-CoV-2 by  FDA under an Emergency Use Authorization (EUA). This EUA will remain  in effect (meaning this test can be used) for the duration of the  Covid-19 declaration under Section 564(b)(1) of the Act, 21  U.S.C. section 360bbb-3(b)(1), unless the authorization is  terminated or revoked. Performed at [facility], [place] [address], [place], [place] [address]   Culture, blood (routine x 2)     Status: None (Preliminary result)   Collection Time: 06/21/19  7:57 PM   Specimen: BLOOD  Result Value Ref Range Status   Specimen Description   Final    BLOOD RIGHT ARM Performed at [facility] [address], [place], [place] [address]    Special Requests   Final    BOTTLES DRAWN AEROBIC AND ANAEROBIC Blood Culture adequate volume Performed at [facility] [address], [place], [place] [address]    Culture   Final    NO GROWTH < 12 HOURS Performed at [facility] [address], [place], [place] [address]    Report Status PENDING  Incomplete  Culture, blood (routine x 2)     Status: None (Preliminary result)   Collection Time: 06/21/19  8:02 PM   Specimen: BLOOD  Result Value Ref Range Status   Specimen Description   Final    BLOOD RIGHT HAND Performed at [facility] [address], [place], [place] [address]    Special Requests   Final    BOTTLES DRAWN  AEROBIC AND ANAEROBIC Blood Culture adequate volume Performed at [facility] [address] [place], [place] [address]    Culture   Final    NO GROWTH < 12 HOURS Performed at [facility] [address], [place], [place] [address]    Report Status PENDING  Incomplete         Radiology Studies: No results found.      Scheduled Meds: . aspirin EC  81 mg Oral Daily  . azithromycin  500 mg Oral Daily  . Chlorhexidine Gluconate Cloth  6 each Topical Daily  . heparin  5,000 Units Subcutaneous Q8H  . insulin aspart  0-5 Units Subcutaneous QHS  . insulin aspart  0-9 Units Subcutaneous TID WC  . insulin aspart  4 Units Subcutaneous TID WC  . insulin glargine  50 Units Subcutaneous QHS  . sodium chloride flush  3 mL Intravenous Q12H  . tamsulosin  0.4 mg Oral QPC supper   Continuous Infusions: . sodium chloride 100 mL/hr at 06/22/19 0600  . cefTRIAXone (ROCEPHIN)  IV 2 g (06/21/19 2116)     LOS: 3 days    Time spent: >42min    Trish Mancinelli, MD [facility]   To contact the attending provider between 7A-7P or the covering provider during after hours 7P-7A, please log into the web site [email or web address] and access using universal [place] password for that web site. If you do not have the password, please call the hospital operator.  06/22/2019, 1:43 PM

## 2019-06-22 NOTE — Progress Notes (Signed)
eLink Physician-Brief Progress Note Patient Name: Shane Bond DOB: December 30, 1960 MRN: [id number]   Date of Service  06/22/2019  HPI/Events of Note  Pt with Klebsiella bacteremia and sepsis, temp spike up to 103 now down, physical exam witnessed by me PT is oriented x 3 and responsive to questions, follows all commands, no focal abnormalities. Labs reviewed. Drowsiness likely effect of sepsis and should improve as antibiotics kick in. HR 55 with normal BP.  eICU Interventions  K+ repleted, blood sugar check (167 mg %), repeat lactic acid (last check was 2.1), O2 via [place] at 2 liters/ min.        Kerry Kass Vasti Yagi 06/22/2019, 11:43 PM

## 2019-06-23 ENCOUNTER — Inpatient Hospital Stay (HOSPITAL_COMMUNITY): Payer: Self-pay

## 2019-06-23 LAB — COMPREHENSIVE METABOLIC PANEL
ALT: 81 U/L — ABNORMAL HIGH (ref 0–44)
AST: 76 U/L — ABNORMAL HIGH (ref 15–41)
Albumin: 2.2 g/dL — ABNORMAL LOW (ref 3.5–5.0)
Alkaline Phosphatase: 75 U/L (ref 38–126)
Anion gap: 6 (ref 5–15)
BUN: 19 mg/dL (ref 6–20)
CO2: 21 mmol/L — ABNORMAL LOW (ref 22–32)
Calcium: 8.3 mg/dL — ABNORMAL LOW (ref 8.9–10.3)
Chloride: 111 mmol/L (ref 98–111)
Creatinine, Ser: 1.12 mg/dL (ref 0.61–1.24)
GFR calc Af Amer: >60 mL/min (ref >60)
GFR calc non Af Amer: >60 mL/min (ref >60)
Glucose, Bld: 276 mg/dL — ABNORMAL HIGH (ref 70–99)
Potassium: 3.9 mmol/L (ref 3.5–5.1)
Sodium: 138 mmol/L (ref 135–145)
Total Bilirubin: 0.6 mg/dL (ref 0.3–1.2)
Total Protein: 5.9 g/dL — ABNORMAL LOW (ref 6.5–8.1)

## 2019-06-23 LAB — MAGNESIUM: Magnesium: 1.7 mg/dL (ref 1.7–2.4)

## 2019-06-23 LAB — CBC
HCT: 31.2 % — ABNORMAL LOW (ref 39.0–52.0)
Hemoglobin: 10.1 g/dL — ABNORMAL LOW (ref 13.0–17.0)
MCH: 31.8 pg (ref 26.0–34.0)
MCHC: 32.4 g/dL (ref 30.0–36.0)
MCV: 98.1 fL (ref 80.0–100.0)
Platelets: 122 10*3/uL — ABNORMAL LOW (ref 150–400)
RBC: 3.18 MIL/uL — ABNORMAL LOW (ref 4.22–5.81)
RDW: 14 % (ref 11.5–15.5)
WBC: 5.9 10*3/uL (ref 4.0–10.5)
nRBC: 0 % (ref 0.0–0.2)

## 2019-06-23 LAB — CBC WITH DIFFERENTIAL/PLATELET
Abs Immature Granulocytes: 0.08 10*3/uL — ABNORMAL HIGH (ref 0.00–0.07)
Basophils Absolute: 0 10*3/uL (ref 0.0–0.1)
Basophils Relative: 1 %
Eosinophils Absolute: 0.2 10*3/uL (ref 0.0–0.5)
Eosinophils Relative: 3 %
HCT: 31.4 % — ABNORMAL LOW (ref 39.0–52.0)
Hemoglobin: 10.1 g/dL — ABNORMAL LOW (ref 13.0–17.0)
Immature Granulocytes: 1 %
Lymphocytes Relative: 16 %
Lymphs Abs: 1.1 10*3/uL (ref 0.7–4.0)
MCH: 32.3 pg (ref 26.0–34.0)
MCHC: 32.2 g/dL (ref 30.0–36.0)
MCV: 100.3 fL — ABNORMAL HIGH (ref 80.0–100.0)
Monocytes Absolute: 0.9 10*3/uL (ref 0.1–1.0)
Monocytes Relative: 14 %
Neutro Abs: 4.4 10*3/uL (ref 1.7–7.7)
Neutrophils Relative %: 65 %
Platelets: 132 10*3/uL — ABNORMAL LOW (ref 150–400)
RBC: 3.13 MIL/uL — ABNORMAL LOW (ref 4.22–5.81)
RDW: 14.2 % (ref 11.5–15.5)
WBC: 6.6 10*3/uL (ref 4.0–10.5)
nRBC: 0 % (ref 0.0–0.2)

## 2019-06-23 LAB — GLUCOSE, CAPILLARY
Glucose-Capillary: 213 mg/dL — ABNORMAL HIGH (ref 70–99)
Glucose-Capillary: 214 mg/dL — ABNORMAL HIGH (ref 70–99)
Glucose-Capillary: 309 mg/dL — ABNORMAL HIGH (ref 70–99)
Glucose-Capillary: 180 mg/dL — ABNORMAL HIGH (ref 70–99)
Glucose-Capillary: 202 mg/dL — ABNORMAL HIGH (ref 70–99)

## 2019-06-23 LAB — BLOOD GAS, ARTERIAL
Acid-base deficit: 3.7 mmol/L — ABNORMAL HIGH (ref 0.0–2.0)
Bicarbonate: 20.1 mmol/L (ref 20.0–28.0)
O2 Saturation: 94.8 %
Patient temperature: 96.5
pCO2 arterial: 32 mmHg (ref 32.0–48.0)
pH, Arterial: 7.408 (ref 7.350–7.450)
pO2, Arterial: 100 mmHg (ref 83.0–108.0)

## 2019-06-23 LAB — BASIC METABOLIC PANEL
Anion gap: 6 (ref 5–15)
BUN: 20 mg/dL (ref 6–20)
CO2: 20 mmol/L — ABNORMAL LOW (ref 22–32)
Calcium: 8 mg/dL — ABNORMAL LOW (ref 8.9–10.3)
Chloride: 113 mmol/L — ABNORMAL HIGH (ref 98–111)
Creatinine, Ser: 1.13 mg/dL (ref 0.61–1.24)
GFR calc Af Amer: >60 mL/min (ref >60)
GFR calc non Af Amer: >60 mL/min (ref >60)
Glucose, Bld: 172 mg/dL — ABNORMAL HIGH (ref 70–99)
Potassium: 4.1 mmol/L (ref 3.5–5.1)
Sodium: 139 mmol/L (ref 135–145)

## 2019-06-23 LAB — LACTIC ACID, PLASMA: Lactic Acid, Venous: 1.1 mmol/L (ref 0.5–1.9)

## 2019-06-23 NOTE — Progress Notes (Signed)
NAME:  Shane Bond, MRN:  [id number], DOB:  10/28/1960, LOS: 4 ADMISSION DATE:  06/19/2019, CONSULTATION DATE:  06/23/2019 REFERRING MD:  Dr Vallery Ridge, CHIEF COMPLAINT:  sepsis   Brief History   59 year old gentleman was admitted with weakness Was found to have Klebsiella pneumonia bacteremia  History of present illness   Transferred to ICU for sepsis High fever, hypoxemia, tachypnea, tachycardia  3-day history of generalized weakness prior to coming to the hospital Has a history of neurogenic bladder for which he normally self catheterizes-this resulted from back surgery about 1999 Has been having some difficulty catheterizing himself History of chronic cough  Past Medical History   Past Medical History:  Diagnosis Date  . 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 Bladder atonia   . BPH (benign prostatic hyperplasia)   . CAD, NATIVE VESSEL 10/29/2009  . Diabetes mellitus type 2, uncontrolled, with complications ([place])   . Hyperlipidemia LDL goal <70   . Kidney stone   . OBESITY 10/22/2009   Significant Hospital Events   Fever 103.4 5/1 /2021  Consults:  pccm  Procedures:  None  Significant Diagnostic Tests:  Kidney ultrasound IMPRESSION: 1. No hydronephrosis or other acute abnormality. 2. Probable 6 mm nonobstructive left renal nephrolithiasis. 3. 3.4 cm simple cyst at the upper pole of the right kidney.  Chest x-ray No acute abnormality  Micro Data:  Blood cultures, urine cultures-Klebsiella pneumonia  Antimicrobials:  Rocephin 4/28>> Azithromycin 4/29 >>  Interim history/subjective:  No high fever today, tachycardia and tachypnea better  Objective   Blood pressure (!) 156/84, pulse 70, temperature (!) 96 F (35.6 C), temperature source Axillary, resp. rate (!) 23, height 6\' 2"  (1.88 m), weight 117.8 kg, SpO2 98 %.        Intake/Output Summary (Last 24 hours) at 06/23/2019 1249 Last data filed at 06/23/2019 1000 Gross per 24 hour  Intake 2315.01 ml  Output 1825  ml  Net 490.01 ml   Filed Weights   06/21/19 0500 06/22/19 0439 06/23/19 0500  Weight: 115.8 kg 118.3 kg 117.8 kg    Examination: General: Middle-age gentleman does not appear to be in respiratory distress HENT: Dry oral mucosa Lungs: Fair air entry bilaterally, no added sounds, clear clinically Cardiovascular: S1-S2 appreciated Abdomen: Bowel sounds appreciated Extremities: No edema  Resolved Hospital Problem list     Assessment & Plan:  Sepsis secondary to Klebsiella bacteremia -Organism sensitive to Rocephin -Continue Rocephin 2 g daily  Possible community-acquired pneumonia -Completed azithromycin  Acute kidney injury -Continue to monitor closely -Avoid nephrotoxic's -Fluid resuscitation  Type 2 diabetes -Continue insulin  Leukocytosis improved   Disposition: Stepdown unit  Labs   CBC: Recent Labs  Lab 06/19/19 2110 06/20/19 0500 06/21/19 0440 06/22/19 0535 06/22/19 1910 06/23/19 0118 06/23/19 0742  WBC 12.2*   < > 6.5 7.1 7.4 6.6 5.9  NEUTROABS 10.3*  --   --   --  6.1 4.4  --   HGB 12.3*   < > 9.7* 9.8* 9.7* 10.1* 10.1*  HCT 36.4*   < > 29.2* 28.9* 29.5* 31.4* 31.2*  MCV 96.3   < > 96.1 95.1 97.7 100.3* 98.1  PLT 163   < > 122* 122* 130* 132* 122*   < > = values in this interval not displayed.    Basic Metabolic Panel: Recent Labs  Lab 06/21/19 0440 06/22/19 0535 06/22/19 1910 06/23/19 0118 06/23/19 0742  NA 133* 133* 135 139 138  K 3.4* 3.8 3.4* 4.1 3.9  CL 106 108  110 113* 111  CO2 19* 18* 18* 20* 21*  GLUCOSE 253* 275* 239* 172* 276*  BUN 31* 22* 22* 20 19  CREATININE 1.53* 1.22 1.23 1.13 1.12  CALCIUM 8.0* 7.9* 8.1* 8.0* 8.3*  MG 1.8  --   --  1.7  --    GFR: Estimated Creatinine Clearance: 98 mL/min (by C-G formula based on SCr of 1.12 mg/dL). Recent Labs  Lab 06/19/19 2110 06/19/19 2243 06/20/19 0500 06/22/19 0535 06/22/19 1910 06/22/19 2208 06/23/19 0118 06/23/19 0742  WBC 12.2*  --    < > 7.1 7.4  --  6.6 5.9   LATICACIDVEN 2.1* 1.9  --   --  1.6 1.1  --   --    < > = values in this interval not displayed.    Liver Function Tests: Recent Labs  Lab 06/19/19 2110 06/22/19 1910 06/23/19 0742  AST 49* 85* 76*  ALT 31 75* 81*  ALKPHOS 48 70 75  BILITOT 2.5* 0.9 0.6  PROT 7.0 5.8* 5.9*  ALBUMIN 3.4* 2.2* 2.2*   No results for input(s): LIPASE, AMYLASE in the last 168 hours. No results for input(s): AMMONIA in the last 168 hours.  ABG    Component Value Date/Time   PHART 7.408 06/23/2019 0045   PCO2ART 32.0 06/23/2019 0045   PO2ART 100 06/23/2019 0045   HCO3 20.1 06/23/2019 0045   TCO2 21.1 11/21/2014 1522   ACIDBASEDEF 3.7 (H) 06/23/2019 0045   O2SAT 94.8 06/23/2019 0045     Coagulation Profile: Recent Labs  Lab 06/19/19 2200  INR 2.2*    Cardiac Enzymes: No results for input(s): CKTOTAL, CKMB, CKMBINDEX, TROPONINI in the last 168 hours.  HbA1C: Hemoglobin A1C  Date/Time Value Ref Range Status  05/14/2019 11:05 AM 8.2 (A) 4.0 - 5.6 % Final  11/05/2018 03:27 PM 7.6 (A) 4.0 - 5.6 % Final   Hgb A1c MFr Bld  Date/Time Value Ref Range Status  04/07/2017 02:53 PM 10.8 (H) 4.8 - 5.6 % Final    Comment:             Prediabetes: 5.7 - 6.4          Diabetes: >6.4          Glycemic control for adults with diabetes: <7.0     CBG: Recent Labs  Lab 06/22/19 1800 06/22/19 2028 06/22/19 2343 06/23/19 0948 06/23/19 1023  GLUCAP 233* 175* 167* 213* 214*    Review of Systems:   Stable  Past Medical History  He,  has a past medical history of AMI, INFERIOR WALL (10/22/2009), Bladder atonia, BPH (benign prostatic hyperplasia), CAD, NATIVE VESSEL (10/29/2009), Diabetes mellitus type 2, uncontrolled, with complications ([place]), Hyperlipidemia LDL goal <70, Kidney stone, and OBESITY (10/22/2009).   Surgical History    Past Surgical History:  Procedure Laterality Date  . BACK SURGERY    . CORONARY STENT PLACEMENT    . CYSTOSCOPY W/ URETERAL STENT PLACEMENT  02/26/2012   Procedure:  CYSTOSCOPY WITH RETROGRADE PYELOGRAM/URETERAL STENT PLACEMENT;  Surgeon: Bernestine Amass, MD;  Location: [facility];  Service: Urology;  Laterality: Left;     Social History   reports that he has been smoking e-cigarettes. He has quit using smokeless tobacco. He reports that he does not drink alcohol or use drugs.   Family History   His family history includes Coronary artery disease in an other family member; Diabetes in his father and paternal aunt; Heart attack in an other family member.   Allergies Allergies  Allergen Reactions  . Lipitor [Atorvastatin] Rash    Sherrilyn Rist, MD [facility] Pager: [phone]

## 2019-06-23 NOTE — Progress Notes (Signed)
PROGRESS NOTE    Shane Bond  ZGY:[id number] DOB: [birth year] DOA: 06/19/2019 PCP: Gildardo Pounds, NP   Brief Narrative:  59 y/o M with HTN, DM, hypertriglyceridemia, neurogenic bladder post Back Sx/BPH with intermittent self catheterization p/w fall/weakness secondary to urospesis complicated by AKI, hyperkalemia, hyperglycemia.   On 06/21/19 developed SOB requiring 2 LNC. On 06/22/19 he developed rigors with fever 100.7 and was transferred to step down for monitoring. BCx NTG. CXR showed improvement in opacities. Pulmonary switched Unasyn to CTX.   Patient somnolent on night of 06/23/19 and had an ABG done that was 7.4 1/32/100/20/95%.  Lactic acid 1.1.  BNP 147.  Used to work as a Animal nutritionist.  Currently lives with mother.  Plays with model cars airplanes.  Does not use a cane or walker at home.   Assessment & Plan:   Principal Problem:   Sepsis due to urinary tract infection ([place]) Active Problems:   CAD, NATIVE VESSEL   Hypertension associated with diabetes ([place])   Hyperlipidemia associated with type 2 diabetes mellitus (HCC)   BPH (benign prostatic hyperplasia)   Self-catheterizes urinary bladder   Type 2 diabetes mellitus with hyperglycemia, with long-term current use of insulin (HCC)   Hyperkalemia   Acute kidney injury ([place])   Lung infiltrate   Thrombocytopenia (HCC)   Normocytic anemia   Bacteremia due to Klebsiella pneumoniae   #Sepsis secondary to Klebsiella UTI with Klebsiella bacteremia -.-Blood cultures 4/28 with Klebsiella and Enterobacter.   Surveillance blood cultures negative to date.   -Improving chest x-ray atelectasis.   -Continue with ceftriaxone and azithromycin for now.  Will consider broadening coverage to include anaerobes if continues to have rigors and or blood cultures returned positive. -If continues to have rigors will consider noncontrast CT chest and abdomen for further work-up. -No evidence of aspiration per speech evaluation.      -Incentive spirometry -Repeat blood cultures, to ensure clearance of bacteremia.   #Bilateral infiltrates, concern for community-acquired pneumonia. -No aspiration per speech. -Repeat chest x-ray improved from prior. -Covid test negative.   -Incentive spirometry  #Syncopal episodes.  Patient denies any LOC.  He reports weakness and malaise with inability to get up from a fall.  #AKI with hyperkalemia.   -Now resolved -Holding home lisinopril   #Type 2 diabetes with hyperglycemia, A1c 8.2.  Likely worsening control in the setting of infection.  FBG's in the 260s -Holding home Metformin, Victoza -Continue Lantus 50 units, home dose -Sliding scale as needed, monitor CBGs -Add NovoLog 4 units 3 times daily with meal coverage   #Normocytic anemia.  Prior hemoglobin 10.7 on 01/2019.  12.3 admission, slowly downtrending, currently 9.7.  No localizing signs or symptoms of bleeding. -Repeat CBC in a.m. -Check iron panel  #Thrombocytopenia, new.  Previous platelet count normal limits, has downtrending from 163-122 today.  Again no signs or symptoms of bleeding.  Currently on subQ heparin -Monitor CBC in a.m.  #Abnormal LFTs AST 76, ALT 81, normal alk phos  #Protein calorie malnutrition Albumin 2.2   DVT prophylaxis: Heparin.  Code Status: full   Family Communication: None.   Disposition Plan:  Status is: Inpatient  Remains inpatient appropriate because:Inpatient level of care appropriate due to severity of illness   Dispo: The patient is from: Home              Anticipated d/c is to: SNF              Anticipated d/c date is: 3 days  Patient currently is not medically stable to d/c.    Consultants:  Pulmonary/critical care.    Procedures:  N Foley catheter in place.  Antimicrobials: CTX/azithromycin.      Subjective: Overnight patient with RR in 45s. Placed on [facility] with duonebs.  He reports a cough every time he eats     Objective: Vitals:   06/23/19 0600 06/23/19 0700 06/23/19 0800 06/23/19 0900  BP: (!) 165/69 (!) 170/83 (!) 156/84   Pulse: (!) 56 70 74 70  Resp: (!) 25 (!) 23 20 (!) 23  Temp:      TempSrc:      SpO2: 100% 100% 99% 98%  Weight:      Height:        Intake/Output Summary (Last 24 hours) at 06/23/2019 1134 Last data filed at 06/23/2019 1000 Gross per 24 hour  Intake 2315.01 ml  Output 2375 ml  Net -59.99 ml   Filed Weights   06/21/19 0500 06/22/19 0439 06/23/19 0500  Weight: 115.8 kg 118.3 kg 117.8 kg    Examination:  General exam: Appears calm and comfortable  Respiratory system: Clear to auscultation. Respiratory effort normal. Cardiovascular system: S1 & S2 heard, RRR. No JVD, murmurs, rubs, gallops or clicks. No pedal edema. Gastrointestinal system: Abdomen is nondistended, soft and nontender. No organomegaly or masses felt. Normal bowel sounds heard.  Periumbilical hernia noted. Central nervous system: Alert and oriented. No focal neurological deficits. Extremities: Symmetric 5 x 5 power. Skin: No rashes, lesions or ulcers Psychiatry: Judgement and insight appear normal. Mood & affect appropriate.     Data Reviewed: I have personally reviewed following labs and imaging studies  CBC: Recent Labs  Lab 06/19/19 2110 06/20/19 0500 06/21/19 0440 06/22/19 0535 06/22/19 1910 06/23/19 0118 06/23/19 0742  WBC 12.2*   < > 6.5 7.1 7.4 6.6 5.9  NEUTROABS 10.3*  --   --   --  6.1 4.4  --   HGB 12.3*   < > 9.7* 9.8* 9.7* 10.1* 10.1*  HCT 36.4*   < > 29.2* 28.9* 29.5* 31.4* 31.2*  MCV 96.3   < > 96.1 95.1 97.7 100.3* 98.1  PLT 163   < > 122* 122* 130* 132* 122*   < > = values in this interval not displayed.   Basic Metabolic Panel: Recent Labs  Lab 06/21/19 0440 06/22/19 0535 06/22/19 1910 06/23/19 0118 06/23/19 0742  NA 133* 133* 135 139 138  K 3.4* 3.8 3.4* 4.1 3.9  CL 106 108 110 113* 111  CO2 19* 18* 18* 20* 21*  GLUCOSE 253* 275* 239* 172* 276*  BUN  31* 22* 22* 20 19  CREATININE 1.53* 1.22 1.23 1.13 1.12  CALCIUM 8.0* 7.9* 8.1* 8.0* 8.3*  MG 1.8  --   --  1.7  --    GFR: Estimated Creatinine Clearance: 98 mL/min (by C-G formula based on SCr of 1.12 mg/dL). Liver Function Tests: Recent Labs  Lab 06/19/19 2110 06/22/19 1910 06/23/19 0742  AST 49* 85* 76*  ALT 31 75* 81*  ALKPHOS 48 70 75  BILITOT 2.5* 0.9 0.6  PROT 7.0 5.8* 5.9*  ALBUMIN 3.4* 2.2* 2.2*   No results for input(s): LIPASE, AMYLASE in the last 168 hours. No results for input(s): AMMONIA in the last 168 hours. Coagulation Profile: Recent Labs  Lab 06/19/19 2200  INR 2.2*   Cardiac Enzymes: No results for input(s): CKTOTAL, CKMB, CKMBINDEX, TROPONINI in the last 168 hours. BNP (last 3 results) No results for input(s): PROBNP  in the last 8760 hours. HbA1C: No results for input(s): HGBA1C in the last 72 hours. CBG: Recent Labs  Lab 06/22/19 1800 06/22/19 2028 06/22/19 2343 06/23/19 0948 06/23/19 1023  GLUCAP 233* 175* 167* 213* 214*   Lipid Profile: No results for input(s): CHOL, HDL, LDLCALC, TRIG, CHOLHDL, LDLDIRECT in the last 72 hours. Thyroid Function Tests: No results for input(s): TSH, T4TOTAL, FREET4, T3FREE, THYROIDAB in the last 72 hours. Anemia Panel: Recent Labs    06/22/19 0535  VITAMINB12 295  FERRITIN 744*  TIBC 201*  IRON 34*   Sepsis Labs: Recent Labs  Lab 06/19/19 2110 06/19/19 2243 06/22/19 1910 06/22/19 2208  LATICACIDVEN 2.1* 1.9 1.6 1.1    Recent Results (from the past 240 hour(s))  Blood Culture (routine x 2)     Status: Abnormal   Collection Time: 06/19/19  8:43 PM   Specimen: BLOOD  Result Value Ref Range Status   Specimen Description   Final    BLOOD LEFT ANTECUBITAL Performed at [facility], [place] [address], [place], [place] [address]    Special Requests   Final    BOTTLES DRAWN AEROBIC AND ANAEROBIC Blood Culture adequate volume Performed at [facility]  [address], [place], [place] [address]    Culture  Setup Time   Final    GRAM NEGATIVE RODS IN BOTH AEROBIC AND ANAEROBIC BOTTLES CRITICAL VALUE NOTED.  VALUE IS CONSISTENT WITH PREVIOUSLY REPORTED AND CALLED VALUE.    Culture (A)  Final    KLEBSIELLA PNEUMONIAE SUSCEPTIBILITIES PERFORMED ON PREVIOUS CULTURE WITHIN THE LAST 5 DAYS. Performed at [facility], [place] [address] [place], [place] [address]    Report Status 06/22/2019 FINAL  Final  Blood Culture (routine x 2)     Status: Abnormal   Collection Time: 06/19/19  8:48 PM   Specimen: BLOOD RIGHT HAND  Result Value Ref Range Status   Specimen Description   Final    BLOOD RIGHT HAND Performed at [facility] [address] [place], [place] [address]    Special Requests   Final    BOTTLES DRAWN AEROBIC AND ANAEROBIC Blood Culture adequate volume Performed at [facility] [address], [place], [place] [address]    Culture  Setup Time   Final    GRAM NEGATIVE RODS IN BOTH AEROBIC AND ANAEROBIC BOTTLES CRITICAL RESULT CALLED TO, READ BACK BY AND VERIFIED WITH: Christean Grief PharmD 16:25 06/20/19 ([email or web address]) Performed at [facility], [place] [address], [place], [place] [address]    Culture KLEBSIELLA PNEUMONIAE (A)  Final   Report Status 06/22/2019 FINAL  Final   Organism ID, Bacteria KLEBSIELLA PNEUMONIAE  Final      Susceptibility   Klebsiella pneumoniae - MIC*    AMPICILLIN >=32 RESISTANT Resistant     CEFAZOLIN <=4 SENSITIVE Sensitive     CEFEPIME <=1 SENSITIVE Sensitive     CEFTAZIDIME <=1 SENSITIVE Sensitive     CEFTRIAXONE <=1 SENSITIVE Sensitive     CIPROFLOXACIN <=0.25 SENSITIVE Sensitive     GENTAMICIN <=1 SENSITIVE Sensitive     IMIPENEM <=0.25 SENSITIVE Sensitive     TRIMETH/SULFA 80 RESISTANT Resistant     AMPICILLIN/SULBACTAM 8 SENSITIVE Sensitive     PIP/TAZO <=4 SENSITIVE Sensitive     * KLEBSIELLA PNEUMONIAE  Blood Culture ID Panel (Reflexed)     Status: Abnormal    Collection Time: 06/19/19  8:48 PM  Result Value Ref Range Status   Enterococcus  species NOT DETECTED NOT DETECTED Final   Listeria monocytogenes NOT DETECTED NOT DETECTED Final   Staphylococcus species NOT DETECTED NOT DETECTED Final   Staphylococcus aureus (BCID) NOT DETECTED NOT DETECTED Final   Streptococcus species NOT DETECTED NOT DETECTED Final   Streptococcus agalactiae NOT DETECTED NOT DETECTED Final   Streptococcus pneumoniae NOT DETECTED NOT DETECTED Final   Streptococcus pyogenes NOT DETECTED NOT DETECTED Final   Acinetobacter baumannii NOT DETECTED NOT DETECTED Final   Enterobacteriaceae species DETECTED (A) NOT DETECTED Final    Comment: Enterobacteriaceae represent a large family of gram-negative bacteria, not a single organism. CRITICAL RESULT CALLED TO, READ BACK BY AND VERIFIED WITH: Christean Grief PharmD 16:25 06/20/19 ([email or web address])    Enterobacter cloacae complex NOT DETECTED NOT DETECTED Final   Escherichia coli NOT DETECTED NOT DETECTED Final   Klebsiella oxytoca NOT DETECTED NOT DETECTED Final   Klebsiella pneumoniae DETECTED (A) NOT DETECTED Final    Comment: CRITICAL RESULT CALLED TO, READ BACK BY AND VERIFIED WITH: Christean Grief PharmD 16:25 06/20/19 ([email or web address])    Proteus species NOT DETECTED NOT DETECTED Final   Serratia marcescens NOT DETECTED NOT DETECTED Final   Carbapenem resistance NOT DETECTED NOT DETECTED Final   Haemophilus influenzae NOT DETECTED NOT DETECTED Final   Neisseria meningitidis NOT DETECTED NOT DETECTED Final   Pseudomonas aeruginosa NOT DETECTED NOT DETECTED Final   Candida albicans NOT DETECTED NOT DETECTED Final   Candida glabrata NOT DETECTED NOT DETECTED Final   Candida krusei NOT DETECTED NOT DETECTED Final   Candida parapsilosis NOT DETECTED NOT DETECTED Final   Candida tropicalis NOT DETECTED NOT DETECTED Final    Comment: Performed at [facility], [place] [address], [place], [place] [address]  Urine culture     Status: Abnormal    Collection Time: 06/19/19 10:21 PM   Specimen: In/Out Cath Urine  Result Value Ref Range Status   Specimen Description   Final    IN/OUT CATH URINE Performed at [facility] [address] [place], [place] [address]    Special Requests   Final    NONE Performed at [facility], [place] [address], [place], [place] [address]    Culture MULTIPLE SPECIES PRESENT, SUGGEST RECOLLECTION (A)  Final   Report Status 06/20/2019 FINAL  Final  Respiratory Panel by RT PCR (Flu A&B, Covid) - Nasopharyngeal Swab     Status: None   Collection Time: 06/19/19 10:21 PM   Specimen: Nasopharyngeal Swab  Result Value Ref Range Status   SARS Coronavirus 2 by RT PCR NEGATIVE NEGATIVE Final    Comment: (NOTE) SARS-CoV-2 target nucleic acids are NOT DETECTED. The SARS-CoV-2 RNA is generally detectable in upper respiratoy specimens during the acute phase of infection. The lowest concentration of SARS-CoV-2 viral copies this assay can detect is 131 copies/mL. A negative result does not preclude SARS-Cov-2 infection and should not be used as the sole basis for treatment or other patient management decisions. A negative result may occur with  improper specimen collection/handling, submission of specimen other than nasopharyngeal swab, presence of viral mutation(s) within the areas targeted by this assay, and inadequate number of viral copies (<131 copies/mL). A negative result must be combined with clinical observations, patient history, and epidemiological information. The expected result is Negative. Fact Sheet for Patients:  PinkCheek.be Fact Sheet for Healthcare Providers:  GravelBags.it This test is not yet ap proved or cleared by the [place] FDA and  has been authorized for detection and/or diagnosis of  SARS-CoV-2 by FDA under an Emergency Use Authorization (EUA). This EUA will remain  in effect (meaning  this test can be used) for the duration of the COVID-19 declaration under Section 564(b)(1) of the Act, 21 U.S.C. section 360bbb-3(b)(1), unless the authorization is terminated or revoked sooner.    Influenza A by PCR NEGATIVE NEGATIVE Final   Influenza B by PCR NEGATIVE NEGATIVE Final    Comment: (NOTE) The Xpert Xpress SARS-CoV-2/FLU/RSV assay is intended as an aid in  the diagnosis of influenza from Nasopharyngeal swab specimens and  should not be used as a sole basis for treatment. Nasal washings and  aspirates are unacceptable for Xpert Xpress SARS-CoV-2/FLU/RSV  testing. Fact Sheet for Patients: PinkCheek.be Fact Sheet for Healthcare Providers: GravelBags.it This test is not yet approved or cleared by the [place] FDA and  has been authorized for detection and/or diagnosis of SARS-CoV-2 by  FDA under an Emergency Use Authorization (EUA). This EUA will remain  in effect (meaning this test can be used) for the duration of the  Covid-19 declaration under Section 564(b)(1) of the Act, 21  U.S.C. section 360bbb-3(b)(1), unless the authorization is  terminated or revoked. Performed at [facility], [place] [address] [place], [place] [address]   Culture, blood (routine x 2)     Status: None (Preliminary result)   Collection Time: 06/21/19  7:57 PM   Specimen: BLOOD  Result Value Ref Range Status   Specimen Description   Final    BLOOD RIGHT ARM Performed at [facility] [address], [place], [place] [address]    Special Requests   Final    BOTTLES DRAWN AEROBIC AND ANAEROBIC Blood Culture adequate volume Performed at [facility] [address], [place], [place] [address]    Culture   Final    NO GROWTH 2 DAYS Performed at [facility] [address], [place], [place] [address]    Report Status PENDING  Incomplete  Culture, blood (routine x 2)      Status: None (Preliminary result)   Collection Time: 06/21/19  8:02 PM   Specimen: BLOOD  Result Value Ref Range Status   Specimen Description   Final    BLOOD RIGHT HAND Performed at [facility] [address] [place], [place] [address]    Special Requests   Final    BOTTLES DRAWN AEROBIC AND ANAEROBIC Blood Culture adequate volume Performed at [facility] [address] [place], [place] [address]    Culture   Final    NO GROWTH 2 DAYS Performed at [facility] [address], [place], [place] [address]    Report Status PENDING  Incomplete  MRSA PCR Screening     Status: None   Collection Time: 06/22/19  5:27 PM   Specimen: Nasal Mucosa; Nasopharyngeal  Result Value Ref Range Status   MRSA by PCR NEGATIVE NEGATIVE Final    Comment:        The GeneXpert MRSA Assay (FDA approved for NASAL specimens only), is one component of a comprehensive MRSA colonization surveillance program. It is not intended to diagnose MRSA infection nor to guide or monitor treatment for MRSA infections. Performed at [facility], [place] [address], [place], [place] [address]   Culture, blood (routine x 2)     Status: None (Preliminary result)   Collection Time: 06/22/19  7:10 PM   Specimen: BLOOD LEFT HAND  Result Value Ref  Range Status   Specimen Description   Final    BLOOD LEFT HAND Performed at [facility] [address], [place], [place] [address]    Special Requests   Final    BOTTLES DRAWN AEROBIC ONLY Blood Culture adequate volume   Culture   Final    NO GROWTH < 12 HOURS Performed at [facility], [place] [address], [place], [place] [address]    Report Status PENDING  Incomplete  Culture, blood (routine x 2)     Status: None (Preliminary result)   Collection Time: 06/22/19  7:10 PM   Specimen: BLOOD LEFT HAND  Result Value Ref Range Status   Specimen Description   Final    BLOOD LEFT  HAND Performed at [facility] [address], [place], [place] [address]    Special Requests   Final    BOTTLES DRAWN AEROBIC ONLY Blood Culture adequate volume Performed at [facility] [address], [place], [place] [address]    Culture   Final    NO GROWTH < 12 HOURS Performed at [facility] [address] [place], [place] [address]    Report Status PENDING  Incomplete         Radiology Studies: DG Chest 2 View  Result Date: 06/22/2019 CLINICAL DATA:  Oxygen desaturation, shortness of breath EXAM: CHEST - 2 VIEW COMPARISON:  06/19/2019 FINDINGS: The heart size and mediastinal contours are within normal limits. Improved lung volumes and aeration of the lungs. Disc degenerative disease of the thoracic spine. IMPRESSION: Improved lung volumes and aeration of the lungs compared to prior examination. No acute abnormality of the lungs. Electronically Signed   By: Eddie Candle M.D.   On: 06/22/2019 17:38   DG Chest Port 1 View  Result Date: 06/23/2019 CLINICAL DATA:  Respiratory failure EXAM: PORTABLE CHEST 1 VIEW COMPARISON:  Chest radiograph from one day prior. FINDINGS: Stable cardiomediastinal silhouette with top-normal heart size. No pneumothorax. No pleural effusion. Stable minimal scarring versus atelectasis at the left costophrenic angle. No pulmonary edema. No acute consolidative airspace disease. IMPRESSION: Stable minimal scarring versus atelectasis at the left costophrenic angle. Otherwise no active disease. Electronically Signed   By: Ilona Sorrel M.D.   On: 06/23/2019 08:57        Scheduled Meds: . aspirin EC  81 mg Oral Daily  . azithromycin  500 mg Oral Daily  . Chlorhexidine Gluconate Cloth  6 each Topical Daily  . heparin  5,000 Units Subcutaneous Q8H  . insulin aspart  0-5 Units Subcutaneous QHS  . insulin aspart  0-9 Units Subcutaneous TID WC  . insulin aspart  4 Units Subcutaneous TID WC  . insulin glargine  50  Units Subcutaneous QHS  . mouth rinse  15 mL Mouth Rinse BID  . sodium chloride flush  3 mL Intravenous Q12H  . tamsulosin  0.4 mg Oral QPC supper   Continuous Infusions: . sodium chloride 125 mL/hr at 06/23/19 1000  . cefTRIAXone (ROCEPHIN)  IV Stopped (06/22/19 2037)     LOS: 4 days    Time spent: >71mn    Darnel Mchan, MD [facility]   To contact the attending provider between 7A-7P or the covering provider during after hours 7P-7A, please log into the web site [email or web address] and access using universal [place] password for that web site. If you do not have the password, please call the hospital operator.  06/23/2019, 11:34 AM

## 2019-06-23 NOTE — Progress Notes (Signed)
Abg drawn on pt wearing 2lnc.  Results for CALDWELL, TRIANO (MRN [id number]) as of 06/23/2019 01:32  Ref. Range 06/23/2019 00:45  FIO2 Unknown NOT PROVIDED  pH, Arterial Latest Ref Range: 7.350 - 7.450  7.408  pCO2 arterial Latest Ref Range: 32.0 - 48.0 mmHg 32.0  pO2, Arterial Latest Ref Range: 83.0 - 108.0 mmHg 100  Acid-base deficit Latest Ref Range: 0.0 - 2.0 mmol/L 3.7 (H)  Bicarbonate Latest Ref Range: 20.0 - 28.0 mmol/L 20.1  O2 Saturation Latest Units: % 94.8  Patient temperature Unknown 96.5  Allens test (pass/fail) Latest Ref Range: PASS  PASS

## 2019-06-24 ENCOUNTER — Inpatient Hospital Stay (HOSPITAL_COMMUNITY): Payer: Self-pay | Admitting: Certified Registered"

## 2019-06-24 ENCOUNTER — Inpatient Hospital Stay (HOSPITAL_COMMUNITY): Payer: Self-pay

## 2019-06-24 ENCOUNTER — Encounter (HOSPITAL_COMMUNITY)
Admission: EM | Disposition: A | Discharge status: Home / Self Care | Payer: Self-pay | Source: Home / Self Care | Attending: Internal Medicine

## 2019-06-24 ENCOUNTER — Encounter (HOSPITAL_COMMUNITY): Payer: Self-pay | Admitting: Internal Medicine

## 2019-06-24 DIAGNOSIS — N132 Hydronephrosis with renal and ureteral calculous obstruction: Secondary | ICD-10-CM | POA: Diagnosis present

## 2019-06-24 DIAGNOSIS — E43 Unspecified severe protein-calorie malnutrition: Secondary | ICD-10-CM | POA: Diagnosis present

## 2019-06-24 HISTORY — PX: CYSTOSCOPY WITH STENT PLACEMENT: SHX5790

## 2019-06-24 LAB — COMPREHENSIVE METABOLIC PANEL
ALT: 77 U/L — ABNORMAL HIGH (ref 0–44)
AST: 58 U/L — ABNORMAL HIGH (ref 15–41)
Albumin: 2.2 g/dL — ABNORMAL LOW (ref 3.5–5.0)
Alkaline Phosphatase: 65 U/L (ref 38–126)
Anion gap: 7 (ref 5–15)
BUN: 15 mg/dL (ref 6–20)
CO2: 22 mmol/L (ref 22–32)
Calcium: 8.1 mg/dL — ABNORMAL LOW (ref 8.9–10.3)
Chloride: 109 mmol/L (ref 98–111)
Creatinine, Ser: 1.03 mg/dL (ref 0.61–1.24)
GFR calc Af Amer: >60 mL/min (ref >60)
GFR calc non Af Amer: >60 mL/min (ref >60)
Glucose, Bld: 151 mg/dL — ABNORMAL HIGH (ref 70–99)
Potassium: 3.6 mmol/L (ref 3.5–5.1)
Sodium: 138 mmol/L (ref 135–145)
Total Bilirubin: 0.6 mg/dL (ref 0.3–1.2)
Total Protein: 5.7 g/dL — ABNORMAL LOW (ref 6.5–8.1)

## 2019-06-24 LAB — CBC
HCT: 28.9 % — ABNORMAL LOW (ref 39.0–52.0)
Hemoglobin: 9.3 g/dL — ABNORMAL LOW (ref 13.0–17.0)
MCH: 31.8 pg (ref 26.0–34.0)
MCHC: 32.2 g/dL (ref 30.0–36.0)
MCV: 99 fL (ref 80.0–100.0)
Platelets: 162 10*3/uL (ref 150–400)
RBC: 2.92 MIL/uL — ABNORMAL LOW (ref 4.22–5.81)
RDW: 14.2 % (ref 11.5–15.5)
WBC: 6.1 10*3/uL (ref 4.0–10.5)
nRBC: 0 % (ref 0.0–0.2)

## 2019-06-24 LAB — GLUCOSE, CAPILLARY
Glucose-Capillary: 175 mg/dL — ABNORMAL HIGH (ref 70–99)
Glucose-Capillary: 158 mg/dL — ABNORMAL HIGH (ref 70–99)
Glucose-Capillary: 181 mg/dL — ABNORMAL HIGH (ref 70–99)
Glucose-Capillary: 422 mg/dL — ABNORMAL HIGH (ref 70–99)
Glucose-Capillary: 325 mg/dL — ABNORMAL HIGH (ref 70–99)

## 2019-06-24 SURGERY — CYSTOSCOPY, WITH STENT INSERTION
Anesthesia: General | Site: Ureter | Laterality: Left

## 2019-06-24 MED ORDER — FENTANYL CITRATE (PF) 250 MCG/5ML IJ SOLN
INTRAMUSCULAR | Status: DC | PRN
  Administered 2019-06-24: 100 ug via INTRAVENOUS
  Administered 2019-06-24: 25 ug via INTRAVENOUS

## 2019-06-24 MED ORDER — DEXAMETHASONE SODIUM PHOSPHATE 10 MG/ML IJ SOLN
INTRAMUSCULAR | Status: DC | PRN
  Administered 2019-06-24: 4 mg via INTRAVENOUS

## 2019-06-24 MED ORDER — ONDANSETRON HCL 4 MG/2ML IJ SOLN
INTRAMUSCULAR | Status: DC | PRN
  Administered 2019-06-24: 4 mg via INTRAVENOUS

## 2019-06-24 MED ORDER — LIDOCAINE 2% (20 MG/ML) 5 ML SYRINGE
INTRAMUSCULAR | Status: DC | PRN
  Administered 2019-06-24: 40 mg via INTRAVENOUS

## 2019-06-24 MED ORDER — SODIUM CHLORIDE 0.9 % IR SOLN
Status: DC | PRN
  Administered 2019-06-24: 3000 mL

## 2019-06-24 MED ORDER — FENTANYL CITRATE (PF) 100 MCG/2ML IJ SOLN: 25.0000 ug | INTRAMUSCULAR | Status: DC | PRN

## 2019-06-24 MED ORDER — CEFAZOLIN SODIUM-DEXTROSE 2-4 GM/100ML-% IV SOLN
2.0000 g | Freq: Three times a day (TID) | INTRAVENOUS | Status: DC
  Administered 2019-06-24 – 2019-06-25 (×2): 2 g via INTRAVENOUS
  Filled 2019-06-24 (×3): qty 100

## 2019-06-24 MED ORDER — FENTANYL CITRATE (PF) 100 MCG/2ML IJ SOLN
INTRAMUSCULAR | Status: AC
  Filled 2019-06-24: qty 2

## 2019-06-24 MED ORDER — ROCURONIUM BROMIDE 10 MG/ML (PF) SYRINGE
PREFILLED_SYRINGE | INTRAVENOUS | Status: DC | PRN
  Administered 2019-06-24: 10 mg via INTRAVENOUS
  Administered 2019-06-24: 50 mg via INTRAVENOUS

## 2019-06-24 MED ORDER — PROPOFOL 10 MG/ML IV BOLUS
INTRAVENOUS | Status: AC
  Filled 2019-06-24: qty 20

## 2019-06-24 MED ORDER — OXYCODONE HCL 5 MG PO TABS: 5.0000 mg | ORAL_TABLET | Freq: Once | ORAL | Status: DC | PRN

## 2019-06-24 MED ORDER — LACTATED RINGERS IV SOLN
INTRAVENOUS | Status: DC | PRN
Start: 2019-06-24 — End: 2019-06-24

## 2019-06-24 MED ORDER — LACTATED RINGERS IV SOLN: INTRAVENOUS | Status: DC

## 2019-06-24 MED ORDER — OXYCODONE HCL 5 MG/5ML PO SOLN: 5.0000 mg | Freq: Once | ORAL | Status: DC | PRN

## 2019-06-24 MED ORDER — IOHEXOL 300 MG/ML  SOLN
INTRAMUSCULAR | Status: DC | PRN
  Administered 2019-06-24: 11:00:00 50 mL

## 2019-06-24 MED ORDER — SUGAMMADEX SODIUM 200 MG/2ML IV SOLN
INTRAVENOUS | Status: DC | PRN
  Administered 2019-06-24: 250 mg via INTRAVENOUS

## 2019-06-24 MED ORDER — PROPOFOL 10 MG/ML IV BOLUS
INTRAVENOUS | Status: DC | PRN
  Administered 2019-06-24: 20 mg via INTRAVENOUS
  Administered 2019-06-24: 120 mg via INTRAVENOUS

## 2019-06-24 MED ORDER — MIDAZOLAM HCL 2 MG/2ML IJ SOLN
INTRAMUSCULAR | Status: AC
  Filled 2019-06-24: qty 2

## 2019-06-24 MED ORDER — ONDANSETRON HCL 4 MG/2ML IJ SOLN: 4.0000 mg | Freq: Once | INTRAMUSCULAR | Status: DC | PRN

## 2019-06-24 MED ORDER — DEXAMETHASONE SODIUM PHOSPHATE 10 MG/ML IJ SOLN
INTRAMUSCULAR | Status: AC
  Filled 2019-06-24: qty 1

## 2019-06-24 MED ORDER — ONDANSETRON HCL 4 MG/2ML IJ SOLN
INTRAMUSCULAR | Status: AC
  Filled 2019-06-24: qty 2

## 2019-06-24 MED ORDER — SUGAMMADEX SODIUM 500 MG/5ML IV SOLN
INTRAVENOUS | Status: AC
  Filled 2019-06-24: qty 5

## 2019-06-24 MED ORDER — LIDOCAINE 2% (20 MG/ML) 5 ML SYRINGE
INTRAMUSCULAR | Status: AC
  Filled 2019-06-24: qty 5

## 2019-06-24 MED ORDER — MIDAZOLAM HCL 2 MG/2ML IJ SOLN
INTRAMUSCULAR | Status: DC | PRN
  Administered 2019-06-24 (×2): 1 mg via INTRAVENOUS

## 2019-06-24 SURGICAL SUPPLY — 16 items
BAG URINE LEG 500ML (DRAIN) ×2 IMPLANT
BAG URO CATCHER STRL LF (MISCELLANEOUS) ×3 IMPLANT
CATH FOLEY 2WAY 5CC 16FR (CATHETERS) ×3
CATH URET 5FR 28IN OPEN ENDED (CATHETERS) ×3 IMPLANT
CATH URTH STD 16FR FL 2W DRN (CATHETERS) IMPLANT
CLOTH BEACON ORANGE TIMEOUT ST (SAFETY) ×3 IMPLANT
GLOVE BIOGEL M STRL SZ7.5 (GLOVE) ×3 IMPLANT
GOWN STRL REUS W/TWL XL LVL3 (GOWN DISPOSABLE) ×3 IMPLANT
GUIDEWIRE STR DUAL SENSOR (WIRE) IMPLANT
GUIDEWIRE ZIPWRE .038 STRAIGHT (WIRE) ×3 IMPLANT
KIT TURNOVER KIT A (KITS) ×2 IMPLANT
MANIFOLD NEPTUNE II (INSTRUMENTS) ×3 IMPLANT
STENT URET 6FRX26 CONTOUR (STENTS) ×2 IMPLANT
TRAY CYSTO PACK (CUSTOM PROCEDURE TRAY) ×3 IMPLANT
TUBING CONNECTING 10 (TUBING) ×2 IMPLANT
TUBING CONNECTING 10' (TUBING) ×1

## 2019-06-24 NOTE — Anesthesia Postprocedure Evaluation (Signed)
Anesthesia Post Note  Patient: Shane Bond  Procedure(s) Performed: CYSTOSCOPY WITH STENT PLACEMENT LEFT URETER WITH LEFT RETROGRADE URETERAL (Left Ureter)     Patient location during evaluation: PACU Anesthesia Type: General Level of consciousness: awake and alert Pain management: pain level controlled Vital Signs Assessment: post-procedure vital signs reviewed and stable Respiratory status: spontaneous breathing, nonlabored ventilation and respiratory function stable Cardiovascular status: blood pressure returned to baseline and stable Postop Assessment: no apparent nausea or vomiting Anesthetic complications: no    Last Vitals:  Vitals:   06/24/19 1135 06/24/19 1253  BP: 121/60 (!) 126/53  Pulse: 64 (!) 57  Resp: 19 15  Temp: 36.4 C   SpO2: 93% 94%    Last Pain:  Vitals:   06/24/19 1135  TempSrc: Oral  PainSc:                  Lidia Collum

## 2019-06-24 NOTE — Consult Note (Signed)
Urology Consult   Physician requesting consult: Mikki Harbor, MD  Reason for consult: emphysematous pyelitis   History of Present Illness: Shane Bond is a 59 y.o. male who has been admitted to the hospital since 06/19/19 with urosepsis and klebsiella bacteremia, generalized weakness, fevers, acute renal failure, chills and left sided flank pain.  CT from 5/2 showed multiple obstructing proximal ureteral calculi with gas in the collecting system consistent with emphysematous pyelitis.    He has a PMHX of CAD, DM2, HTN, HLD, a neurogenic bladder following a back injury (requires CIC) and kidney stones (required operative mgmt several years ago).  He is currently being followed by a urologist in [facility], but the patient cannot remember his/her name.  He currently has an indwelling Foley in place that is draining cloudy urine.     Past Medical History:  Diagnosis Date  . 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 Bladder atonia   . BPH (benign prostatic hyperplasia)   . CAD, NATIVE VESSEL 10/29/2009  . Diabetes mellitus type 2, uncontrolled, with complications ([place])   . Hyperlipidemia LDL goal <70   . Kidney stone   . OBESITY 10/22/2009    Past Surgical History:  Procedure Laterality Date  . BACK SURGERY    . CORONARY STENT PLACEMENT    . CYSTOSCOPY W/ URETERAL STENT PLACEMENT  02/26/2012   Procedure: CYSTOSCOPY WITH RETROGRADE PYELOGRAM/URETERAL STENT PLACEMENT;  Surgeon: Bernestine Amass, MD;  Location: [facility];  Service: Urology;  Laterality: Left;    Current Hospital Medications:  Home Meds:  Current Meds  Medication Sig  . Ascorbic Acid (VITAMIN C PO) Take 1 tablet by mouth daily.  [name] aspirin EC 81 MG tablet Take 81 mg by mouth daily.  [name] gemfibrozil (LOPID) 600 MG tablet Take 1 tablet (600 mg total) by mouth 2 (two) times daily before a meal.  . LANTUS SOLOSTAR 100 UNIT/ML Solostar Pen INJECT 50 UNITS INTO THE SKIN DAILY AT 10 PM. (Patient taking differently: Inject 50 Units into  the skin at bedtime. )  . liraglutide (VICTOZA) 18 MG/3ML SOPN INJECT 1.8MG  INTO THE SKIN ONCE DAILY  . lisinopril (ZESTRIL) 5 MG tablet Take 1 tablet (5 mg total) by mouth daily.  . metFORMIN (GLUCOPHAGE) 1000 MG tablet Take 1 tablet (1,000 mg total) by mouth 2 (two) times daily with a meal.  . omega-3 acid ethyl esters (LOVAZA) 1 g capsule Take 1 capsule (1 g total) by mouth 2 (two) times daily.  . [DISCONTINUED] amoxicillin-clavulanate (AUGMENTIN) 875-125 MG tablet Take 1 tablet by mouth 2 (two) times daily.    Scheduled Meds: . aspirin EC  81 mg Oral Daily  . azithromycin  500 mg Oral Daily  . Chlorhexidine Gluconate Cloth  6 each Topical Daily  . heparin  5,000 Units Subcutaneous Q8H  . insulin aspart  0-5 Units Subcutaneous QHS  . insulin aspart  0-9 Units Subcutaneous TID WC  . insulin aspart  4 Units Subcutaneous TID WC  . insulin glargine  50 Units Subcutaneous QHS  . mouth rinse  15 mL Mouth Rinse BID  . sodium chloride flush  3 mL Intravenous Q12H  . tamsulosin  0.4 mg Oral QPC supper   Continuous Infusions: . sodium chloride 125 mL/hr at 06/24/19 0500  . cefTRIAXone (ROCEPHIN)  IV Stopped (06/23/19 2217)   PRN Meds:.acetaminophen **OR** acetaminophen, levalbuterol, lip balm, ondansetron **OR** ondansetron (ZOFRAN) IV  Allergies:  Allergies  Allergen Reactions  . Lipitor [Atorvastatin] Rash    Family History  Problem Relation Age of Onset  . Diabetes Father   . Coronary artery disease Other   . Heart attack Other   . Diabetes Paternal Aunt     Social History:  reports that he has been smoking e-cigarettes. He has quit using smokeless tobacco. He reports that he does not drink alcohol or use drugs.  ROS: A complete review of systems was performed.  All systems are negative except for pertinent findings as noted.  Physical Exam:  Vital signs in last 24 hours: Temp:  [98.3 F (36.8 C)-98.8 F (37.1 C)] 98.8 F (37.1 C) (05/03 0400) Pulse Rate:  [58-132] 65  (05/03 0755) Resp:  [14-36] 18 (05/03 0755) BP: (102-147)/(46-71) 136/70 (05/03 0755) SpO2:  [81 %-100 %] 100 % (05/03 0755) Weight:  [121.5 kg] 121.5 kg (05/03 0500) Constitutional:  Alert and oriented, No acute distress Cardiovascular: Regular rate and rhythm, No JVD Respiratory: Normal respiratory effort, Lungs clear bilaterally GI: Abdomen is soft, nontender, nondistended, no abdominal masses GU: No CVA tenderness Lymphatic: No lymphadenopathy Neurologic: Grossly intact, no focal deficits Psychiatric: Normal mood and affect  Laboratory Data:  Recent Labs    06/22/19 0535 06/22/19 1910 06/23/19 0118 06/23/19 0742 06/24/19 0309  WBC 7.1 7.4 6.6 5.9 6.1  HGB 9.8* 9.7* 10.1* 10.1* 9.3*  HCT 28.9* 29.5* 31.4* 31.2* 28.9*  PLT 122* 130* 132* 122* 162    Recent Labs    06/22/19 0535 06/22/19 1910 06/23/19 0118 06/23/19 0742 06/24/19 0309  NA 133* 135 139 138 138  K 3.8 3.4* 4.1 3.9 3.6  CL 108 110 113* 111 109  GLUCOSE 275* 239* 172* 276* 151*  BUN 22* 22* 20 19 15   CALCIUM 7.9* 8.1* 8.0* 8.3* 8.1*  CREATININE 1.22 1.23 1.13 1.12 1.03     Results for orders placed or performed during the hospital encounter of 06/19/19 (from the past 24 hour(s))  Glucose, capillary     Status: Abnormal   Collection Time: 06/23/19  9:48 AM  Result Value Ref Range   Glucose-Capillary 213 (H) 70 - 99 mg/dL   Comment 1 Notify RN    Comment 2 Document in Chart   Glucose, capillary     Status: Abnormal   Collection Time: 06/23/19 10:23 AM  Result Value Ref Range   Glucose-Capillary 214 (H) 70 - 99 mg/dL  Glucose, capillary     Status: Abnormal   Collection Time: 06/23/19  2:09 PM  Result Value Ref Range   Glucose-Capillary 309 (H) 70 - 99 mg/dL   Comment 1 Notify RN    Comment 2 Document in Chart   Glucose, capillary     Status: Abnormal   Collection Time: 06/23/19  5:45 PM  Result Value Ref Range   Glucose-Capillary 180 (H) 70 - 99 mg/dL  Glucose, capillary     Status:  Abnormal   Collection Time: 06/23/19  9:33 PM  Result Value Ref Range   Glucose-Capillary 202 (H) 70 - 99 mg/dL  CBC     Status: Abnormal   Collection Time: 06/24/19  3:09 AM  Result Value Ref Range   WBC 6.1 4.0 - 10.5 K/uL   RBC 2.92 (L) 4.22 - 5.81 MIL/uL   Hemoglobin 9.3 (L) 13.0 - 17.0 g/dL   HCT 28.9 (L) 39.0 - 52.0 %   MCV 99.0 80.0 - 100.0 fL   MCH 31.8 26.0 - 34.0 pg   MCHC 32.2 30.0 - 36.0 g/dL   RDW 14.2 11.5 - 15.5 %   Platelets  162 150 - 400 K/uL   nRBC 0.0 0.0 - 0.2 %  Comprehensive metabolic panel     Status: Abnormal   Collection Time: 06/24/19  3:09 AM  Result Value Ref Range   Sodium 138 135 - 145 mmol/L   Potassium 3.6 3.5 - 5.1 mmol/L   Chloride 109 98 - 111 mmol/L   CO2 22 22 - 32 mmol/L   Glucose, Bld 151 (H) 70 - 99 mg/dL   BUN 15 6 - 20 mg/dL   Creatinine, Ser 1.03 0.61 - 1.24 mg/dL   Calcium 8.1 (L) 8.9 - 10.3 mg/dL   Total Protein 5.7 (L) 6.5 - 8.1 g/dL   Albumin 2.2 (L) 3.5 - 5.0 g/dL   AST 58 (H) 15 - 41 U/L   ALT 77 (H) 0 - 44 U/L   Alkaline Phosphatase 65 38 - 126 U/L   Total Bilirubin 0.6 0.3 - 1.2 mg/dL   GFR calc non Af Amer >60 >60 mL/min   GFR calc Af Amer >60 >60 mL/min   Anion gap 7 5 - 15   Recent Results (from the past 240 hour(s))  Blood Culture (routine x 2)     Status: Abnormal   Collection Time: 06/19/19  8:43 PM   Specimen: BLOOD  Result Value Ref Range Status   Specimen Description   Final    BLOOD LEFT ANTECUBITAL Performed at [facility], [place] [address] [place], [place] [address]    Special Requests   Final    BOTTLES DRAWN AEROBIC AND ANAEROBIC Blood Culture adequate volume Performed at [facility] [address] [place], [place] [address]    Culture  Setup Time   Final    GRAM NEGATIVE RODS IN BOTH AEROBIC AND ANAEROBIC BOTTLES CRITICAL VALUE NOTED.  VALUE IS CONSISTENT WITH PREVIOUSLY REPORTED AND CALLED VALUE.    Culture (A)  Final    KLEBSIELLA  PNEUMONIAE SUSCEPTIBILITIES PERFORMED ON PREVIOUS CULTURE WITHIN THE LAST 5 DAYS. Performed at [facility], [place] [address] [place], [place] [address]    Report Status 06/22/2019 FINAL  Final  Blood Culture (routine x 2)     Status: Abnormal   Collection Time: 06/19/19  8:48 PM   Specimen: BLOOD RIGHT HAND  Result Value Ref Range Status   Specimen Description   Final    BLOOD RIGHT HAND Performed at [facility] [address], [place], [place] [address]    Special Requests   Final    BOTTLES DRAWN AEROBIC AND ANAEROBIC Blood Culture adequate volume Performed at [facility] [address] [place], [place] [address]    Culture  Setup Time   Final    GRAM NEGATIVE RODS IN BOTH AEROBIC AND ANAEROBIC BOTTLES CRITICAL RESULT CALLED TO, READ BACK BY AND VERIFIED WITH: Christean Grief PharmD 16:25 06/20/19 ([email or web address]) Performed at [facility], [place] [address] [place], [place] [address]    Culture KLEBSIELLA PNEUMONIAE (A)  Final   Report Status 06/22/2019 FINAL  Final   Organism ID, Bacteria KLEBSIELLA PNEUMONIAE  Final      Susceptibility   Klebsiella pneumoniae - MIC*    AMPICILLIN >=32 RESISTANT Resistant     CEFAZOLIN <=4 SENSITIVE Sensitive     CEFEPIME <=1 SENSITIVE Sensitive     CEFTAZIDIME <=1 SENSITIVE Sensitive     CEFTRIAXONE <=1 SENSITIVE Sensitive     CIPROFLOXACIN <=0.25 SENSITIVE Sensitive     GENTAMICIN <=1 SENSITIVE Sensitive  IMIPENEM <=0.25 SENSITIVE Sensitive     TRIMETH/SULFA 80 RESISTANT Resistant     AMPICILLIN/SULBACTAM 8 SENSITIVE Sensitive     PIP/TAZO <=4 SENSITIVE Sensitive     * KLEBSIELLA PNEUMONIAE  Blood Culture ID Panel (Reflexed)     Status: Abnormal   Collection Time: 06/19/19  8:48 PM  Result Value Ref Range Status   Enterococcus species NOT DETECTED NOT DETECTED Final   Listeria monocytogenes NOT DETECTED NOT DETECTED Final   Staphylococcus species NOT DETECTED NOT DETECTED Final    Staphylococcus aureus (BCID) NOT DETECTED NOT DETECTED Final   Streptococcus species NOT DETECTED NOT DETECTED Final   Streptococcus agalactiae NOT DETECTED NOT DETECTED Final   Streptococcus pneumoniae NOT DETECTED NOT DETECTED Final   Streptococcus pyogenes NOT DETECTED NOT DETECTED Final   Acinetobacter baumannii NOT DETECTED NOT DETECTED Final   Enterobacteriaceae species DETECTED (A) NOT DETECTED Final    Comment: Enterobacteriaceae represent a large family of gram-negative bacteria, not a single organism. CRITICAL RESULT CALLED TO, READ BACK BY AND VERIFIED WITH: Christean Grief PharmD 16:25 06/20/19 ([email or web address])    Enterobacter cloacae complex NOT DETECTED NOT DETECTED Final   Escherichia coli NOT DETECTED NOT DETECTED Final   Klebsiella oxytoca NOT DETECTED NOT DETECTED Final   Klebsiella pneumoniae DETECTED (A) NOT DETECTED Final    Comment: CRITICAL RESULT CALLED TO, READ BACK BY AND VERIFIED WITH: Christean Grief PharmD 16:25 06/20/19 ([email or web address])    Proteus species NOT DETECTED NOT DETECTED Final   Serratia marcescens NOT DETECTED NOT DETECTED Final   Carbapenem resistance NOT DETECTED NOT DETECTED Final   Haemophilus influenzae NOT DETECTED NOT DETECTED Final   Neisseria meningitidis NOT DETECTED NOT DETECTED Final   Pseudomonas aeruginosa NOT DETECTED NOT DETECTED Final   Candida albicans NOT DETECTED NOT DETECTED Final   Candida glabrata NOT DETECTED NOT DETECTED Final   Candida krusei NOT DETECTED NOT DETECTED Final   Candida parapsilosis NOT DETECTED NOT DETECTED Final   Candida tropicalis NOT DETECTED NOT DETECTED Final    Comment: Performed at [facility], [place] [address], [place], [place] [address]  Urine culture     Status: Abnormal   Collection Time: 06/19/19 10:21 PM   Specimen: In/Out Cath Urine  Result Value Ref Range Status   Specimen Description   Final    IN/OUT CATH URINE Performed at [facility] [address] [place], [place] [address]     Special Requests   Final    NONE Performed at [facility], [place] [address] [place],  [address]    Culture MULTIPLE SPECIES PRESENT, SUGGEST RECOLLECTION (A)  Final   Report Status 06/20/2019 FINAL  Final  Respiratory Panel by RT PCR (Flu A&B, Covid) - Nasopharyngeal Swab     Status: None   Collection Time: 06/19/19 10:21 PM   Specimen: Nasopharyngeal Swab  Result Value Ref Range Status   SARS Coronavirus 2 by RT PCR NEGATIVE NEGATIVE Final    Comment: (NOTE) SARS-CoV-2 target nucleic acids are NOT DETECTED. The SARS-CoV-2 RNA is generally detectable in upper respiratoy specimens during the acute phase of infection. The lowest concentration of SARS-CoV-2 viral copies this assay can detect is 131 copies/mL. A negative result does not preclude SARS-Cov-2 infection and should not be used as the sole basis for treatment or other patient management decisions. A negative result may occur with  improper specimen collection/handling, submission of specimen other than nasopharyngeal swab, presence of viral mutation(s) within the areas targeted by this  assay, and inadequate number of viral copies (<131 copies/mL). A negative result must be combined with clinical observations, patient history, and epidemiological information. The expected result is Negative. Fact Sheet for Patients:  PinkCheek.be Fact Sheet for Healthcare Providers:  GravelBags.it This test is not yet ap proved or cleared by the [place] FDA and  has been authorized for detection and/or diagnosis of SARS-CoV-2 by FDA under an Emergency Use Authorization (EUA). This EUA will remain  in effect (meaning this test can be used) for the duration of the COVID-19 declaration under Section 564(b)(1) of the Act, 21 U.S.C. section 360bbb-3(b)(1), unless the authorization is terminated or revoked sooner.    Influenza A by PCR NEGATIVE NEGATIVE Final    Influenza B by PCR NEGATIVE NEGATIVE Final    Comment: (NOTE) The Xpert Xpress SARS-CoV-2/FLU/RSV assay is intended as an aid in  the diagnosis of influenza from Nasopharyngeal swab specimens and  should not be used as a sole basis for treatment. Nasal washings and  aspirates are unacceptable for Xpert Xpress SARS-CoV-2/FLU/RSV  testing. Fact Sheet for Patients: PinkCheek.be Fact Sheet for Healthcare Providers: GravelBags.it This test is not yet approved or cleared by the [place] FDA and  has been authorized for detection and/or diagnosis of SARS-CoV-2 by  FDA under an Emergency Use Authorization (EUA). This EUA will remain  in effect (meaning this test can be used) for the duration of the  Covid-19 declaration under Section 564(b)(1) of the Act, 21  U.S.C. section 360bbb-3(b)(1), unless the authorization is  terminated or revoked. Performed at [facility], [place] [address] [place], [place] [address]   Culture, blood (routine x 2)     Status: None (Preliminary result)   Collection Time: 06/21/19  7:57 PM   Specimen: BLOOD  Result Value Ref Range Status   Specimen Description   Final    BLOOD RIGHT ARM Performed at [facility] [address], [place], [place] [address]    Special Requests   Final    BOTTLES DRAWN AEROBIC AND ANAEROBIC Blood Culture adequate volume Performed at [facility] [address] [place], [place] [address]    Culture   Final    NO GROWTH 2 DAYS Performed at [facility] [address] [place], [place] [address]    Report Status PENDING  Incomplete  Culture, blood (routine x 2)     Status: None (Preliminary result)   Collection Time: 06/21/19  8:02 PM   Specimen: BLOOD  Result Value Ref Range Status   Specimen Description   Final    BLOOD RIGHT HAND Performed at [facility] [address],  [place], [place] [address]    Special Requests   Final    BOTTLES DRAWN AEROBIC AND ANAEROBIC Blood Culture adequate volume Performed at [facility] [address], [place], [place] [address]    Culture   Final    NO GROWTH 2 DAYS Performed at [facility] [address], [place],  [address]    Report Status PENDING  Incomplete  MRSA PCR Screening     Status: None   Collection Time: 06/22/19  5:27 PM   Specimen: Nasal Mucosa; Nasopharyngeal  Result Value Ref Range Status   MRSA by PCR NEGATIVE NEGATIVE Final    Comment:        The GeneXpert MRSA Assay (FDA approved for NASAL specimens only), is one component of a comprehensive MRSA colonization  surveillance program. It is not intended to diagnose MRSA infection nor to guide or monitor treatment for MRSA infections. Performed at [facility], [place] [address], [place], [place] [address]   Culture, blood (routine x 2)     Status: None (Preliminary result)   Collection Time: 06/22/19  7:10 PM   Specimen: BLOOD LEFT HAND  Result Value Ref Range Status   Specimen Description   Final    BLOOD LEFT HAND Performed at [facility] [address], [place], [place] [address]    Special Requests   Final    BOTTLES DRAWN AEROBIC ONLY Blood Culture adequate volume   Culture   Final    NO GROWTH < 12 HOURS Performed at [facility], [place] [address], [place], [place] [address]    Report Status PENDING  Incomplete  Culture, blood (routine x 2)     Status: None (Preliminary result)   Collection Time: 06/22/19  7:10 PM   Specimen: BLOOD LEFT HAND  Result Value Ref Range Status   Specimen Description   Final    BLOOD LEFT HAND Performed at [facility] [address], [place], [place] [address]    Special Requests   Final    BOTTLES DRAWN AEROBIC ONLY Blood Culture adequate volume Performed at [facility] [address], [place], [place] [address]    Culture   Final    NO GROWTH < 12 HOURS Performed at [facility] [address], [place], [place] [address]    Report Status PENDING  Incomplete    Renal Function: Recent Labs    06/20/19 0500 06/21/19 0440 06/22/19 0535 06/22/19 1910 06/23/19 0118 06/23/19 0742 06/24/19 0309  CREATININE 2.15* 1.53* 1.22 1.23 1.13 1.12 1.03   Estimated Creatinine Clearance: 108.2 mL/min (by C-G formula based on SCr of 1.03 mg/dL).  Radiologic Imaging: CT ABDOMEN PELVIS WO CONTRAST  Result Date: 06/23/2019 CLINICAL DATA:  Sepsis. EXAM: CT CHEST, ABDOMEN AND PELVIS WITHOUT CONTRAST TECHNIQUE: Multidetector CT imaging of the chest, abdomen and pelvis was performed following the standard protocol without IV contrast. COMPARISON:  None. Chest radiograph earlier today and yesterday. Noncontrast abdominal CT 02/19/2019 FINDINGS: CT CHEST FINDINGS Cardiovascular: Atherosclerosis of the thoracic aorta. No aortic aneurysm. Mild aortic tortuosity. Coronary artery calcifications. Borderline mild cardiomegaly. Small pericardial effusion measuring up to 13 mm adjacent to the left atrium. Mediastinum/Nodes: Lack of contrast and patient motion limits detailed assessment for adenopathy. Allowing for these limitations, no bulky mediastinal or gross hilar adenopathy. No esophageal wall thickening. No visualized thyroid nodule. Lungs/Pleura: Small bilateral pleural effusions. Streaky opacities in the left lower lobe consistent with atelectasis. There are vague scattered subpleural ground-glass opacities in the left upper lobe, series 507 image 48, 58, and 65. Minimal subpleural ground-glass opacity in the right upper lobe, series 507 image 65 and 78. No confluent airspace disease. The trachea and mainstem bronchi are patent. No evidence of pulmonary mass. 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 Posterior disc osteophyte complex at T11-T12 causes mass  effect on the spinal canal. There are no acute or suspicious osseous abnormalities. CT ABDOMEN PELVIS FINDINGS Hepatobiliary: Prominent size liver spanning 20 cm cranial caudal. No focal lesion on noncontrast exam. Gallbladder is partially distended. There is no calcified gallstone or biliary dilatation. No pericholecystic inflammation. Pancreas: No ductal dilatation or inflammation. Spleen: Normal in size without focal abnormality. Adrenals/Urinary Tract: Normal adrenal glands. Mild left hydroureteronephrosis. There are  2 adjacent stones in the left proximal ureter just beyond the ureteropelvic junction measuring 6 x 10 mm and 5 x 5 mm. The ureter distal to the stones is also dilated to the bladder insertion. There are innumerable layering stones in the urinary bladder which extend to the region of the left trigone. Multiple additional nonobstructing stones throughout the left kidney. Mild left perinephric edema. There is air within the left renal pelvis and lower pole calyx. There is no evidence of parenchymal air. Foley catheter decompresses the urinary bladder with bladder air. 3.3 cm cyst in the upper right kidney. There is no right hydronephrosis. No right renal calculi. Mild right perinephric edema also seen on prior exam. No right ureteral calculi. Stomach/Bowel: Bowel evaluation is limited in the absence of enteric contrast. Stomach partially distended. No small bowel dilatation to suggest obstruction. No obvious bowel inflammation. The sigmoid colon is redundant coursing into the upper abdomen. Small to moderate volume of stool in the colon. There is minimal liquid stool in the transverse colon without colonic wall thickening or pericolonic edema. The appendix is not well-defined. Vascular/Lymphatic: Aortic atherosclerosis, age advanced. No aortic aneurysm. No bulky abdominopelvic adenopathy. Reproductive: Prostate is unremarkable. Other: Left greater than right retroperitoneal edema tracking into the  pelvis. Trace pelvic free fluid. Patchy subcutaneous edema involving the left flank. Supraumbilical ventral abdominal wall hernia just to the left of midline contains fat. There is also a small fat containing umbilical hernia Musculoskeletal: There are no acute or suspicious osseous abnormalities. Multilevel degenerative change in the lumbar spine. IMPRESSION: 1. Moderate left hydroureteronephrosis. There are 2 adjacent stones in the left proximal ureter, however the ureter is also dilated distal to the stones, and it is unclear if the stones are course of renal obstruction. There are numerable dependent stones in the urinary bladder, including in the region of the urinary trigone. 2. Left perinephric edema, which may be related to obstructive uropathy, however there is air in the left renal pelvis in the lower pole calyx. This may be ascending related to Foley catheter placement versus infection and emphysematous pyelitis. There is no renal parenchymal air. 3. Small bilateral pleural effusions. Streaky opacities in the left lower lobe consistent with atelectasis. 4. Scattered subpleural ground-glass opacities in both upper lobes are nonspecific, may be infectious or inflammatory. Consider follow-up chest CT in 12 months to ensure resolution. 5. Small pericardial effusion. Aortic Atherosclerosis (ICD10-I70.0) . Electronically Signed   By: Keith Rake M.D.   On: 06/23/2019 17:13   DG Chest 2 View  Result Date: 06/22/2019 CLINICAL DATA:  Oxygen desaturation, shortness of breath EXAM: CHEST - 2 VIEW COMPARISON:  06/19/2019 FINDINGS: The heart size and mediastinal contours are within normal limits. Improved lung volumes and aeration of the lungs. Disc degenerative disease of the thoracic spine. IMPRESSION: Improved lung volumes and aeration of the lungs compared to prior examination. No acute abnormality of the lungs. Electronically Signed   By: Eddie Candle M.D.   On: 06/22/2019 17:38   CT CHEST WO  CONTRAST  Result Date: 06/23/2019 CLINICAL DATA:  Sepsis. EXAM: CT CHEST, ABDOMEN AND PELVIS WITHOUT CONTRAST TECHNIQUE: Multidetector CT imaging of the chest, abdomen and pelvis was performed following the standard protocol without IV contrast. COMPARISON:  None. Chest radiograph earlier today and yesterday. Noncontrast abdominal CT 02/19/2019 FINDINGS: CT CHEST FINDINGS Cardiovascular: Atherosclerosis of the thoracic aorta. No aortic aneurysm. Mild aortic tortuosity. Coronary artery calcifications. Borderline mild cardiomegaly. Small pericardial effusion measuring up to 13 mm adjacent to  the left atrium. Mediastinum/Nodes: Lack of contrast and patient motion limits detailed assessment for adenopathy. Allowing for these limitations, no bulky mediastinal or gross hilar adenopathy. No esophageal wall thickening. No visualized thyroid nodule. Lungs/Pleura: Small bilateral pleural effusions. Streaky opacities in the left lower lobe consistent with atelectasis. There are vague scattered subpleural ground-glass opacities in the left upper lobe, series 507 image 48, 58, and 65. Minimal subpleural ground-glass opacity in the right upper lobe, series 507 image 65 and 78. No confluent airspace disease. The trachea and mainstem bronchi are patent. No evidence of pulmonary mass. 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 Posterior disc osteophyte complex at T11-T12 causes mass effect on the spinal canal. There are no acute or suspicious osseous abnormalities. CT ABDOMEN PELVIS FINDINGS Hepatobiliary: Prominent size liver spanning 20 cm cranial caudal. No focal lesion on noncontrast exam. Gallbladder is partially distended. There is no calcified gallstone or biliary dilatation. No pericholecystic inflammation. Pancreas: No ductal dilatation or inflammation. Spleen: Normal in size without focal abnormality. Adrenals/Urinary Tract: Normal adrenal glands. Mild left hydroureteronephrosis. There  are 2 adjacent stones in the left proximal ureter just beyond the ureteropelvic junction measuring 6 x 10 mm and 5 x 5 mm. The ureter distal to the stones is also dilated to the bladder insertion. There are innumerable layering stones in the urinary bladder which extend to the region of the left trigone. Multiple additional nonobstructing stones throughout the left kidney. Mild left perinephric edema. There is air within the left renal pelvis and lower pole calyx. There is no evidence of parenchymal air. Foley catheter decompresses the urinary bladder with bladder air. 3.3 cm cyst in the upper right kidney. There is no right hydronephrosis. No right renal calculi. Mild right perinephric edema also seen on prior exam. No right ureteral calculi. Stomach/Bowel: Bowel evaluation is limited in the absence of enteric contrast. Stomach partially distended. No small bowel dilatation to suggest obstruction. No obvious bowel inflammation. The sigmoid colon is redundant coursing into the upper abdomen. Small to moderate volume of stool in the colon. There is minimal liquid stool in the transverse colon without colonic wall thickening or pericolonic edema. The appendix is not well-defined. Vascular/Lymphatic: Aortic atherosclerosis, age advanced. No aortic aneurysm. No bulky abdominopelvic adenopathy. Reproductive: Prostate is unremarkable. Other: Left greater than right retroperitoneal edema tracking into the pelvis. Trace pelvic free fluid. Patchy subcutaneous edema involving the left flank. Supraumbilical ventral abdominal wall hernia just to the left of midline contains fat. There is also a small fat containing umbilical hernia Musculoskeletal: There are no acute or suspicious osseous abnormalities. Multilevel degenerative change in the lumbar spine. IMPRESSION: 1. Moderate left hydroureteronephrosis. There are 2 adjacent stones in the left proximal ureter, however the ureter is also dilated distal to the stones, and it is  unclear if the stones are course of renal obstruction. There are numerable dependent stones in the urinary bladder, including in the region of the urinary trigone. 2. Left perinephric edema, which may be related to obstructive uropathy, however there is air in the left renal pelvis in the lower pole calyx. This may be ascending related to Foley catheter placement versus infection and emphysematous pyelitis. There is no renal parenchymal air. 3. Small bilateral pleural effusions. Streaky opacities in the left lower lobe consistent with atelectasis. 4. Scattered subpleural ground-glass opacities in both upper lobes are nonspecific, may be infectious or inflammatory. Consider follow-up chest CT in 12 months to ensure resolution. 5. Small pericardial effusion. Aortic Atherosclerosis (ICD10-I70.0) .  Electronically Signed   By: Keith Rake M.D.   On: 06/23/2019 17:13   DG Chest Port 1 View  Result Date: 06/23/2019 CLINICAL DATA:  Respiratory failure EXAM: PORTABLE CHEST 1 VIEW COMPARISON:  Chest radiograph from one day prior. FINDINGS: Stable cardiomediastinal silhouette with top-normal heart size. No pneumothorax. No pleural effusion. Stable minimal scarring versus atelectasis at the left costophrenic angle. No pulmonary edema. No acute consolidative airspace disease. IMPRESSION: Stable minimal scarring versus atelectasis at the left costophrenic angle. Otherwise no active disease. Electronically Signed   By: Ilona Sorrel M.D.   On: 06/23/2019 08:57    I independently reviewed the above imaging studies.  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   -The risks, benefits and alternatives of cystoscopy with LEFT JJ stent placement was discussed  with the patient.  Risks include, but are not limited to: bleeding, urinary tract infection, ureteral injury, ureteral stricture disease, chronic pain, urinary symptoms, bladder injury, stent migration, the need for nephrostomy tube placement, MI, CVA, DVT, PE and the inherent risks with general anesthesia.  The patient voices understanding and wishes to proceed.   The patient will be urgently taken to the OR today. NPO. Hold aspirin.   Ellison Hughs, MD [facility] 06/24/2019, 8:13 AM

## 2019-06-24 NOTE — OR Nursing (Addendum)
Call Dr Lovena Neighbours to request come to OR. Enroute.

## 2019-06-24 NOTE — Progress Notes (Signed)
PROGRESS NOTE    Shane Bond  JKD:[id number] DOB: [birth year] DOA: 06/19/2019 PCP: Gildardo Pounds, NP   Brief Narrative:  59 y/o M with HTN, DM, hypertriglyceridemia, neurogenic bladder post Back Sx/BPH with intermittent self catheterization p/w fall/weakness secondary to urospesis complicated by AKI, hyperkalemia, hyperglycemia. Blood cultures 4/28 with Klebsiella and Enterobacter.   Surveillance blood cultures negative.    On 06/21/19 developed SOB requiring 2 LNC. On 06/22/19 he developed rigors with fever 100.7 and was transferred to step down for monitoring. BCx NTG. CXR showed improvement in opacities. Pulmonary switched Unasyn to CTX.   Patient somnolent on night of 06/23/19 and had an ABG done that was 7.4 1/32/100/20/95%.  Lactic acid 1.1.  BNP 147.  Given recurrent bouts of rigors with negative blood cultures, will proceed with a CT chest and abdomen which showed obstructing proximal left ureteral calculi with moderate left-sided hydronephrosis.  It also showed small bilateral pleural effusions however no evidence of pneumonia.  Patient went to the OR 06/24/2019 for cystoscopy with left JJ stent placement.  Used to work as a Animal nutritionist.  Currently lives with mother.  Plays with model cars airplanes.  Does not use a cane or walker at home.   Assessment & Plan:   Principal Problem:   Sepsis due to urinary tract infection ([place]) Active Problems:   CAD, NATIVE VESSEL   Hypertension associated with diabetes ([place])   Hyperlipidemia associated with type 2 diabetes mellitus (HCC)   BPH (benign prostatic hyperplasia)   Self-catheterizes urinary bladder   Type 2 diabetes mellitus with hyperglycemia, with long-term current use of insulin (H[facility])   Hyperkalemia   Acute kidney injury ([place])   Lung infiltrate   Thrombocytopenia (H[facility])   Normocytic anemia   Bacteremia due to Klebsiella pneumoniae   #Sepsis secondary to Klebsiella bacteremia -.  -Blood cultures 4/28 with  Klebsiella and Enterobacter.   Surveillance blood cultures negative to date.   -CT chest and abdomen 06/23/2019 showed left-sided hydronephrosis with proximal left ureteral stone.  Small bilateral pleural effusions however no evidence of consolidation/pneumonia.  -Continue with ceftriaxone and azithromycin for now.  -No evidence of aspiration per speech evaluation.   -Incentive spirometry -Repeat blood cultures, to ensure clearance of bacteremia.   #Syncopal episodes.  Patient denies any LOC.  He reports weakness and malaise with inability to get up from a fall.  #AKI with hyperkalemia.   -Now resolved -Holding home lisinopril   #Type 2 diabetes with hyperglycemia, A1c 8.2.  Likely worsening control in the setting of infection.  FBG's in the 260s -Holding home Metformin, Victoza -Continue Lantus 50 units, home dose -Sliding scale as needed, monitor CBGs -Add NovoLog 4 units 3 times daily with meal coverage   #Thrombocytopenia, new.  Previous platelet count normal limits, has downtrending from 163-122 today.  Again no signs or symptoms of bleeding.  Currently on subQ heparin -Monitor CBC in a.m.  #Abnormal LFTs AST 76, ALT 81, normal alk phos  #Protein calorie malnutrition severe Albumin 2.2   DVT prophylaxis: Heparin.  Code Status: full   Family Communication: None.   Disposition Plan:  Status is: Inpatient  Remains inpatient appropriate because:Inpatient level of care appropriate due to severity of illness   Dispo: The patient is from: Home              Anticipated d/c is to: SNF              Anticipated d/c date is: 3 days  Patient currently is not medically stable to d/c.    Consultants:  Pulmonary/critical care, GU    Procedures:  N Foley catheter in place.  Antimicrobials: CTX/azithromycin.      Subjective: No new events overnight.  CT chest findings noted for left-sided hydronephrosis.   Objective: Vitals:   06/24/19 1053  06/24/19 1100 06/24/19 1115 06/24/19 1135  BP: 107/61 111/62 129/75 121/60  Pulse: 75 67 62 64  Resp: '18 19 17 19  ' Temp: 97.9 F (36.6 C)  97.7 F (36.5 C) 97.6 F (36.4 C)  TempSrc:    Oral  SpO2: 91% 98% 95% 93%  Weight:      Height:        Intake/Output Summary (Last 24 hours) at 06/24/2019 1218 Last data filed at 06/24/2019 1115 Gross per 24 hour  Intake 3123.93 ml  Output 3120 ml  Net 3.93 ml   Filed Weights   06/22/19 0439 06/23/19 0500 06/24/19 0500  Weight: 118.3 kg 117.8 kg 121.5 kg    Examination:  General exam: Appears calm and comfortable  Respiratory system: Clear to auscultation. Respiratory effort normal. Bilateral lower lobe crepitus present.  Cardiovascular system: S1 & S2 heard, RRR. No JVD, murmurs, rubs, gallops or clicks. No pedal edema. Gastrointestinal system: Abdomen is nondistended, soft and nontender. No organomegaly or masses felt. Normal bowel sounds heard.  Periumbilical hernia noted. Central nervous system: Alert and oriented. No focal neurological deficits. Extremities: Symmetric 5 x 5 power. Skin: No rashes, lesions or ulcers Psychiatry: Judgement and insight appear normal. Mood & affect appropriate.     Data Reviewed: I have personally reviewed following labs and imaging studies  CBC: Recent Labs  Lab 06/19/19 2110 06/20/19 0500 06/22/19 0535 06/22/19 1910 06/23/19 0118 06/23/19 0742 06/24/19 0309  WBC 12.2*   < > 7.1 7.4 6.6 5.9 6.1  NEUTROABS 10.3*  --   --  6.1 4.4  --   --   HGB 12.3*   < > 9.8* 9.7* 10.1* 10.1* 9.3*  HCT 36.4*   < > 28.9* 29.5* 31.4* 31.2* 28.9*  MCV 96.3   < > 95.1 97.7 100.3* 98.1 99.0  PLT 163   < > 122* 130* 132* 122* 162   < > = values in this interval not displayed.   Basic Metabolic Panel: Recent Labs  Lab 06/21/19 0440 06/21/19 0440 06/22/19 0535 06/22/19 1910 06/23/19 0118 06/23/19 0742 06/24/19 0309  NA 133*   < > 133* 135 139 138 138  K 3.4*   < > 3.8 3.4* 4.1 3.9 3.6  CL 106   < >  108 110 113* 111 109  CO2 19*   < > 18* 18* 20* 21* 22  GLUCOSE 253*   < > 275* 239* 172* 276* 151*  BUN 31*   < > 22* 22* '20 19 15  ' CREATININE 1.53*   < > 1.22 1.23 1.13 1.12 1.03  CALCIUM 8.0*   < > 7.9* 8.1* 8.0* 8.3* 8.1*  MG 1.8  --   --   --  1.7  --   --    < > = values in this interval not displayed.   GFR: Estimated Creatinine Clearance: 108.2 mL/min (by C-G formula based on SCr of 1.03 mg/dL). Liver Function Tests: Recent Labs  Lab 06/19/19 2110 06/22/19 1910 06/23/19 0742 06/24/19 0309  AST 49* 85* 76* 58*  ALT 31 75* 81* 77*  ALKPHOS 48 70 75 65  BILITOT 2.5* 0.9 0.6 0.6  PROT 7.0  5.8* 5.9* 5.7*  ALBUMIN 3.4* 2.2* 2.2* 2.2*   No results for input(s): LIPASE, AMYLASE in the last 168 hours. No results for input(s): AMMONIA in the last 168 hours. Coagulation Profile: Recent Labs  Lab 06/19/19 2200  INR 2.2*   Cardiac Enzymes: No results for input(s): CKTOTAL, CKMB, CKMBINDEX, TROPONINI in the last 168 hours. BNP (last 3 results) No results for input(s): PROBNP in the last 8760 hours. HbA1C: No results for input(s): HGBA1C in the last 72 hours. CBG: Recent Labs  Lab 06/23/19 1409 06/23/19 1745 06/23/19 2133 06/24/19 0843 06/24/19 1116  GLUCAP 309* 180* 202* 175* 158*   Lipid Profile: No results for input(s): CHOL, HDL, LDLCALC, TRIG, CHOLHDL, LDLDIRECT in the last 72 hours. Thyroid Function Tests: No results for input(s): TSH, T4TOTAL, FREET4, T3FREE, THYROIDAB in the last 72 hours. Anemia Panel: Recent Labs    06/22/19 0535  VITAMINB12 295  FERRITIN 744*  TIBC 201*  IRON 34*   Sepsis Labs: Recent Labs  Lab 06/19/19 2110 06/19/19 2243 06/22/19 1910 06/22/19 2208  LATICACIDVEN 2.1* 1.9 1.6 1.1    Recent Results (from the past 240 hour(s))  Blood Culture (routine x 2)     Status: Abnormal   Collection Time: 06/19/19  8:43 PM   Specimen: BLOOD  Result Value Ref Range Status   Specimen Description   Final    BLOOD LEFT  ANTECUBITAL Performed at [facility], [place] [address] [place], [place] [address]    Special Requests   Final    BOTTLES DRAWN AEROBIC AND ANAEROBIC Blood Culture adequate volume Performed at [facility] [address] [place], [place] [address]    Culture  Setup Time   Final    GRAM NEGATIVE RODS IN BOTH AEROBIC AND ANAEROBIC BOTTLES CRITICAL VALUE NOTED.  VALUE IS CONSISTENT WITH PREVIOUSLY REPORTED AND CALLED VALUE.    Culture (A)  Final    KLEBSIELLA PNEUMONIAE SUSCEPTIBILITIES PERFORMED ON PREVIOUS CULTURE WITHIN THE LAST 5 DAYS. Performed at [name], [place] [address] [place], [place] [address]    Report Status 06/22/2019 FINAL  Final  Blood Culture (routine x 2)     Status: Abnormal   Collection Time: 06/19/19  8:48 PM   Specimen: BLOOD RIGHT HAND  Result Value Ref Range Status   Specimen Description   Final    BLOOD RIGHT HAND Performed at [place] [address] [place], [place] [address]    Special Requests   Final    BOTTLES DRAWN AEROBIC AND ANAEROBIC Blood Culture adequate volume Performed at [place] [address], [place], [place] [address]    Culture  Setup Time   Final    GRAM NEGATIVE RODS IN BOTH AEROBIC AND ANAEROBIC BOTTLES CRITICAL RESULT CALLED TO, READ BACK BY AND VERIFIED WITH: Christean Grief PharmD 16:25 06/20/19 ([email or web address]) Performed at [facility], [place] [address], [place], [place] [address]    Culture KLEBSIELLA PNEUMONIAE (A)  Final   Report Status 06/22/2019 FINAL  Final   Organism ID, Bacteria KLEBSIELLA PNEUMONIAE  Final      Susceptibility   Klebsiella pneumoniae - MIC*    AMPICILLIN >=32 RESISTANT Resistant     CEFAZOLIN <=4 SENSITIVE Sensitive     CEFEPIME <=1 SENSITIVE Sensitive     CEFTAZIDIME <=1 SENSITIVE Sensitive     CEFTRIAXONE <=1 SENSITIVE Sensitive     CIPROFLOXACIN <=0.25 SENSITIVE Sensitive     GENTAMICIN <=1 SENSITIVE  Sensitive     IMIPENEM <=0.25 SENSITIVE Sensitive     TRIMETH/SULFA 80 RESISTANT Resistant     AMPICILLIN/SULBACTAM 8 SENSITIVE Sensitive     PIP/TAZO <=4 SENSITIVE Sensitive     * KLEBSIELLA PNEUMONIAE  Blood Culture ID Panel (Reflexed)     Status: Abnormal   Collection Time: 06/19/19  8:48 PM  Result Value Ref Range Status   Enterococcus species NOT DETECTED NOT DETECTED Final   Listeria monocytogenes NOT DETECTED NOT DETECTED Final   Staphylococcus species NOT DETECTED NOT DETECTED Final   Staphylococcus aureus (BCID) NOT DETECTED NOT DETECTED Final   Streptococcus species NOT DETECTED NOT DETECTED Final   Streptococcus agalactiae NOT DETECTED NOT DETECTED Final   Streptococcus pneumoniae NOT DETECTED NOT DETECTED Final   Streptococcus pyogenes NOT DETECTED NOT DETECTED Final   Acinetobacter baumannii NOT DETECTED NOT DETECTED Final   Enterobacteriaceae species DETECTED (A) NOT DETECTED Final    Comment: Enterobacteriaceae represent a large family of gram-negative bacteria, not a single organism. CRITICAL RESULT CALLED TO, READ BACK BY AND VERIFIED WITH: Christean Grief PharmD 16:25 06/20/19 ([email or web address])    Enterobacter cloacae complex NOT DETECTED NOT DETECTED Final   Escherichia coli NOT DETECTED NOT DETECTED Final   Klebsiella oxytoca NOT DETECTED NOT DETECTED Final   Klebsiella pneumoniae DETECTED (A) NOT DETECTED Final    Comment: CRITICAL RESULT CALLED TO, READ BACK BY AND VERIFIED WITH: Christean Grief PharmD 16:25 06/20/19 ([email or web address])    Proteus species NOT DETECTED NOT DETECTED Final   Serratia marcescens NOT DETECTED NOT DETECTED Final   Carbapenem resistance NOT DETECTED NOT DETECTED Final   Haemophilus influenzae NOT DETECTED NOT DETECTED Final   Neisseria meningitidis NOT DETECTED NOT DETECTED Final   Pseudomonas aeruginosa NOT DETECTED NOT DETECTED Final   Candida albicans NOT DETECTED NOT DETECTED Final   Candida glabrata NOT DETECTED NOT DETECTED Final   Candida krusei NOT  DETECTED NOT DETECTED Final   Candida parapsilosis NOT DETECTED NOT DETECTED Final   Candida tropicalis NOT DETECTED NOT DETECTED Final    Comment: Performed at [facility], [place] [address] [place], [place] [address]  Urine culture     Status: Abnormal   Collection Time: 06/19/19 10:21 PM   Specimen: In/Out Cath Urine  Result Value Ref Range Status   Specimen Description   Final    IN/OUT CATH URINE Performed at [place] [address], [place], [place] [address]    Special Requests   Final    NONE Performed at [facility], [place] [address], [place],  [address]    Culture MULTIPLE SPECIES PRESENT, SUGGEST RECOLLECTION (A)  Final   Report Status 06/20/2019 FINAL  Final  Respiratory Panel by RT PCR (Flu A&B, Covid) - Nasopharyngeal Swab     Status: None   Collection Time: 06/19/19 10:21 PM   Specimen: Nasopharyngeal Swab  Result Value Ref Range Status   SARS Coronavirus 2 by RT PCR NEGATIVE NEGATIVE Final    Comment: (NOTE) SARS-CoV-2 target nucleic acids are NOT DETECTED. The SARS-CoV-2 RNA is generally detectable in upper respiratoy specimens during the acute phase of infection. The lowest concentration of SARS-CoV-2 viral copies this assay can detect is 131 copies/mL. A negative result does not preclude SARS-Cov-2 infection and should not be used as the sole basis for treatment or other patient management decisions. A negative result may occur with  improper specimen collection/handling, submission of specimen other than nasopharyngeal swab, presence of viral mutation(s) within the  areas targeted by this assay, and inadequate number of viral copies (<131 copies/mL). A negative result must be combined with clinical observations, patient history, and epidemiological information. The expected result is Negative. Fact Sheet for Patients:  PinkCheek.be Fact Sheet for Healthcare Providers:   GravelBags.it This test is not yet ap proved or cleared by the [place] FDA and  has been authorized for detection and/or diagnosis of SARS-CoV-2 by FDA under an Emergency Use Authorization (EUA). This EUA will remain  in effect (meaning this test can be used) for the duration of the COVID-19 declaration under Section 564(b)(1) of the Act, 21 U.S.C. section 360bbb-3(b)(1), unless the authorization is terminated or revoked sooner.    Influenza A by PCR NEGATIVE NEGATIVE Final   Influenza B by PCR NEGATIVE NEGATIVE Final    Comment: (NOTE) The Xpert Xpress SARS-CoV-2/FLU/RSV assay is intended as an aid in  the diagnosis of influenza from Nasopharyngeal swab specimens and  should not be used as a sole basis for treatment. Nasal washings and  aspirates are unacceptable for Xpert Xpress SARS-CoV-2/FLU/RSV  testing. Fact Sheet for Patients: PinkCheek.be Fact Sheet for Healthcare Providers: GravelBags.it This test is not yet approved or cleared by the [place] FDA and  has been authorized for detection and/or diagnosis of SARS-CoV-2 by  FDA under an Emergency Use Authorization (EUA). This EUA will remain  in effect (meaning this test can be used) for the duration of the  Covid-19 declaration under Section 564(b)(1) of the Act, 21  U.S.C. section 360bbb-3(b)(1), unless the authorization is  terminated or revoked. Performed at [facility], [place] [address], [place], [place] [address]   Culture, blood (routine x 2)     Status: None (Preliminary result)   Collection Time: 06/21/19  7:57 PM   Specimen: BLOOD  Result Value Ref Range Status   Specimen Description   Final    BLOOD RIGHT ARM Performed at [place] [address] [place], [place] [address]    Special Requests   Final    BOTTLES DRAWN AEROBIC AND ANAEROBIC Blood Culture adequate  volume Performed at [place] [address] [place], [place] [address]    Culture   Final    NO GROWTH 3 DAYS Performed at [facility], [place] [address] [place], [place] [address]    Report Status PENDING  Incomplete  Culture, blood (routine x 2)     Status: None (Preliminary result)   Collection Time: 06/21/19  8:02 PM   Specimen: BLOOD  Result Value Ref Range Status   Specimen Description   Final    BLOOD RIGHT HAND Performed at [place] [address], [place], [place] [address]    Special Requests   Final    BOTTLES DRAWN AEROBIC AND ANAEROBIC Blood Culture adequate volume Performed at [place] [address] [place], [place] [address]    Culture   Final    NO GROWTH 3 DAYS Performed at [facility], [place] [address], [place], [place] [address]    Report Status PENDING  Incomplete  MRSA PCR Screening     Status: None   Collection Time: 06/22/19  5:27 PM   Specimen: Nasal Mucosa; Nasopharyngeal  Result Value Ref Range Status   MRSA by PCR NEGATIVE NEGATIVE Final    Comment:        The GeneXpert MRSA Assay (FDA approved for NASAL specimens only), is one component of  a comprehensive MRSA colonization surveillance program. It is not intended to diagnose MRSA infection nor to guide or monitor treatment for MRSA infections. Performed at [facility], [place] [address], [place], [place] [address]   Culture, blood (routine x 2)     Status: None (Preliminary result)   Collection Time: 06/22/19  7:10 PM   Specimen: BLOOD LEFT HAND  Result Value Ref Range Status   Specimen Description   Final    BLOOD LEFT HAND Performed at [place] [address], [place], [place] [address]    Special Requests   Final    BOTTLES DRAWN AEROBIC ONLY Blood Culture adequate volume   Culture   Final    NO GROWTH 2 DAYS Performed at [facility], [place] [address], [place], [place] [address]    Report Status PENDING  Incomplete  Culture, blood (routine x 2)     Status: None (Preliminary result)   Collection Time: 06/22/19  7:10 PM   Specimen: BLOOD LEFT HAND  Result Value Ref Range Status   Specimen Description   Final    BLOOD LEFT HAND Performed at [place] [address] [place], [place] [address]    Special Requests   Final    BOTTLES DRAWN AEROBIC ONLY Blood Culture adequate volume Performed at [place] [facility] [address] [place], [place] [address]    Culture   Final    NO GROWTH 2 DAYS Performed at [facility] [address], [place], [place] [address]    Report Status PENDING  Incomplete  Culture, blood (routine x 2)     Status: None (Preliminary result)   Collection Time: 06/23/19 12:32 PM   Specimen: BLOOD RIGHT HAND  Result Value Ref Range Status   Specimen Description   Final    BLOOD RIGHT HAND Performed at [place] [address], [place], [place] [address]    Special Requests   Final    BOTTLES DRAWN AEROBIC AND ANAEROBIC Blood Culture adequate volume Performed at [place] [address] [place], [place] [address]    Culture   Final    NO GROWTH < 24 HOURS Performed at [place] [address], [place], [place] [address]    Report Status PENDING  Incomplete  Culture, blood (routine x 2)     Status: None (Preliminary result)   Collection Time: 06/23/19 12:33 PM   Specimen: BLOOD LEFT HAND  Result Value Ref Range Status   Specimen Description   Final    BLOOD LEFT HAND Performed at [facility] [name], [place], [place] [address]    Special Requests   Final    BOTTLES DRAWN AEROBIC ONLY Blood Culture adequate volume Performed at [facility] [address], [place], [place] [address]    Culture   Final    NO GROWTH < 24 HOURS Performed at [facility] [address], [place], [place] [address]    Report Status PENDING  Incomplete        Radiology Studies: CT ABDOMEN PELVIS WO CONTRAST  Result Date: 06/23/2019 CLINICAL DATA:  Sepsis. EXAM: CT CHEST, ABDOMEN AND PELVIS WITHOUT CONTRAST TECHNIQUE: Multidetector CT imaging of the chest, abdomen and pelvis was performed following the standard protocol without IV contrast. COMPARISON:  None. Chest radiograph earlier today and yesterday. Noncontrast abdominal CT 02/19/2019 FINDINGS: CT CHEST FINDINGS  Cardiovascular: Atherosclerosis of the thoracic aorta. No aortic aneurysm. Mild aortic tortuosity. Coronary artery calcifications. Borderline mild cardiomegaly. Small pericardial effusion measuring up to 13 mm adjacent to the left atrium. Mediastinum/Nodes: Lack of contrast and patient motion limits detailed assessment for adenopathy. Allowing for these limitations, no bulky mediastinal or gross hilar adenopathy. No esophageal wall thickening. No visualized thyroid nodule. Lungs/Pleura: Small bilateral pleural effusions. Streaky opacities in the left lower lobe consistent with atelectasis. There are vague scattered subpleural ground-glass opacities in the left upper lobe, series 507 image 48, 58, and 65. Minimal subpleural ground-glass opacity in the right upper lobe, series 507 image 65 and 78. No confluent airspace disease. The trachea and mainstem bronchi are patent. No evidence of pulmonary mass. 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 Posterior disc osteophyte complex at T11-T12 causes mass effect on the spinal canal. There are no acute or suspicious osseous abnormalities. CT ABDOMEN PELVIS FINDINGS Hepatobiliary: Prominent size liver spanning 20 cm cranial caudal. No focal lesion on noncontrast exam. Gallbladder is partially distended. There is no calcified gallstone or biliary dilatation. No pericholecystic inflammation. Pancreas: No ductal dilatation or inflammation.  Spleen: Normal in size without focal abnormality. Adrenals/Urinary Tract: Normal adrenal glands. Mild left hydroureteronephrosis. There are 2 adjacent stones in the left proximal ureter just beyond the ureteropelvic junction measuring 6 x 10 mm and 5 x 5 mm. The ureter distal to the stones is also dilated to the bladder insertion. There are innumerable layering stones in the urinary bladder which extend to the region of the left trigone. Multiple additional nonobstructing stones throughout the left kidney. Mild left perinephric edema. There is air within the left renal pelvis and lower pole calyx. There is no evidence of parenchymal air. Foley catheter decompresses the urinary bladder with bladder air. 3.3 cm cyst in the upper right kidney. There is no right hydronephrosis. No right renal calculi. Mild right perinephric edema also seen on prior exam. No right ureteral calculi. Stomach/Bowel: Bowel evaluation is limited in the absence of enteric contrast. Stomach partially distended. No small bowel dilatation to suggest obstruction. No obvious bowel inflammation. The sigmoid colon is redundant coursing into the upper abdomen. Small to moderate volume of stool in the colon. There is minimal liquid stool in the transverse colon without colonic wall thickening or pericolonic edema. The appendix is not well-defined. Vascular/Lymphatic: Aortic atherosclerosis, age advanced. No aortic aneurysm. No bulky abdominopelvic adenopathy. Reproductive: Prostate is unremarkable. Other: Left greater than right retroperitoneal edema tracking into the pelvis. Trace pelvic free fluid. Patchy subcutaneous edema involving the left flank. Supraumbilical ventral abdominal wall hernia just to the left of midline contains fat. There is also a small fat containing umbilical hernia Musculoskeletal: There are no acute or suspicious osseous abnormalities. Multilevel degenerative change in the lumbar spine. IMPRESSION: 1. Moderate left  hydroureteronephrosis. There are 2 adjacent stones in the left proximal ureter, however the ureter is also dilated distal to the stones, and it is unclear if the stones are course of renal obstruction. There are numerable dependent stones in the urinary bladder, including in the region of the urinary trigone. 2. Left perinephric edema, which may be related to obstructive uropathy, however there is air in the left renal pelvis in the lower pole calyx. This may be ascending related to Foley catheter placement versus infection and emphysematous pyelitis. There is no renal parenchymal air. 3. Small bilateral pleural effusions. Streaky opacities in the left lower lobe consistent with atelectasis. 4. Scattered subpleural ground-glass opacities  in both upper lobes are nonspecific, may be infectious or inflammatory. Consider follow-up chest CT in 12 months to ensure resolution. 5. Small pericardial effusion. Aortic Atherosclerosis (ICD10-I70.0) . Electronically Signed   By: Keith Rake M.D.   On: 06/23/2019 17:13   DG Chest 2 View  Result Date: 06/22/2019 CLINICAL DATA:  Oxygen desaturation, shortness of breath EXAM: CHEST - 2 VIEW COMPARISON:  06/19/2019 FINDINGS: The heart size and mediastinal contours are within normal limits. Improved lung volumes and aeration of the lungs. Disc degenerative disease of the thoracic spine. IMPRESSION: Improved lung volumes and aeration of the lungs compared to prior examination. No acute abnormality of the lungs. Electronically Signed   By: Eddie Candle M.D.   On: 06/22/2019 17:38   CT CHEST WO CONTRAST  Result Date: 06/23/2019 CLINICAL DATA:  Sepsis. EXAM: CT CHEST, ABDOMEN AND PELVIS WITHOUT CONTRAST TECHNIQUE: Multidetector CT imaging of the chest, abdomen and pelvis was performed following the standard protocol without IV contrast. COMPARISON:  None. Chest radiograph earlier today and yesterday. Noncontrast abdominal CT 02/19/2019 FINDINGS: CT CHEST FINDINGS  Cardiovascular: Atherosclerosis of the thoracic aorta. No aortic aneurysm. Mild aortic tortuosity. Coronary artery calcifications. Borderline mild cardiomegaly. Small pericardial effusion measuring up to 13 mm adjacent to the left atrium. Mediastinum/Nodes: Lack of contrast and patient motion limits detailed assessment for adenopathy. Allowing for these limitations, no bulky mediastinal or gross hilar adenopathy. No esophageal wall thickening. No visualized thyroid nodule. Lungs/Pleura: Small bilateral pleural effusions. Streaky opacities in the left lower lobe consistent with atelectasis. There are vague scattered subpleural ground-glass opacities in the left upper lobe, series 507 image 48, 58, and 65. Minimal subpleural ground-glass opacity in the right upper lobe, series 507 image 65 and 78. No confluent airspace disease. The trachea and mainstem bronchi are patent. No evidence of pulmonary mass. 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 Posterior disc osteophyte complex at T11-T12 causes mass effect on the spinal canal. There are no acute or suspicious osseous abnormalities. CT ABDOMEN PELVIS FINDINGS Hepatobiliary: Prominent size liver spanning 20 cm cranial caudal. No focal lesion on noncontrast exam. Gallbladder is partially distended. There is no calcified gallstone or biliary dilatation. No pericholecystic inflammation. Pancreas: No ductal dilatation or inflammation. Spleen: Normal in size without focal abnormality. Adrenals/Urinary Tract: Normal adrenal glands. Mild left hydroureteronephrosis. There are 2 adjacent stones in the left proximal ureter just beyond the ureteropelvic junction measuring 6 x 10 mm and 5 x 5 mm. The ureter distal to the stones is also dilated to the bladder insertion. There are innumerable layering stones in the urinary bladder which extend to the region of the left trigone. Multiple additional nonobstructing stones throughout the left kidney.  Mild left perinephric edema. There is air within the left renal pelvis and lower pole calyx. There is no evidence of parenchymal air. Foley catheter decompresses the urinary bladder with bladder air. 3.3 cm cyst in the upper right kidney. There is no right hydronephrosis. No right renal calculi. Mild right perinephric edema also seen on prior exam. No right ureteral calculi. Stomach/Bowel: Bowel evaluation is limited in the absence of enteric contrast. Stomach partially distended. No small bowel dilatation to suggest obstruction. No obvious bowel inflammation. The sigmoid colon is redundant coursing into the upper abdomen. Small to moderate volume of stool in the colon. There is minimal liquid stool in the transverse colon without colonic wall thickening or pericolonic edema. The appendix is not well-defined. Vascular/Lymphatic: Aortic atherosclerosis, age advanced. No aortic aneurysm. No  bulky abdominopelvic adenopathy. Reproductive: Prostate is unremarkable. Other: Left greater than right retroperitoneal edema tracking into the pelvis. Trace pelvic free fluid. Patchy subcutaneous edema involving the left flank. Supraumbilical ventral abdominal wall hernia just to the left of midline contains fat. There is also a small fat containing umbilical hernia Musculoskeletal: There are no acute or suspicious osseous abnormalities. Multilevel degenerative change in the lumbar spine. IMPRESSION: 1. Moderate left hydroureteronephrosis. There are 2 adjacent stones in the left proximal ureter, however the ureter is also dilated distal to the stones, and it is unclear if the stones are course of renal obstruction. There are numerable dependent stones in the urinary bladder, including in the region of the urinary trigone. 2. Left perinephric edema, which may be related to obstructive uropathy, however there is air in the left renal pelvis in the lower pole calyx. This may be ascending related to Foley catheter placement versus  infection and emphysematous pyelitis. There is no renal parenchymal air. 3. Small bilateral pleural effusions. Streaky opacities in the left lower lobe consistent with atelectasis. 4. Scattered subpleural ground-glass opacities in both upper lobes are nonspecific, may be infectious or inflammatory. Consider follow-up chest CT in 12 months to ensure resolution. 5. Small pericardial effusion. Aortic Atherosclerosis (ICD10-I70.0) . Electronically Signed   By: Keith Rake M.D.   On: 06/23/2019 17:13   DG Chest Port 1 View  Result Date: 06/23/2019 CLINICAL DATA:  Respiratory failure EXAM: PORTABLE CHEST 1 VIEW COMPARISON:  Chest radiograph from one day prior. FINDINGS: Stable cardiomediastinal silhouette with top-normal heart size. No pneumothorax. No pleural effusion. Stable minimal scarring versus atelectasis at the left costophrenic angle. No pulmonary edema. No acute consolidative airspace disease. IMPRESSION: Stable minimal scarring versus atelectasis at the left costophrenic angle. Otherwise no active disease. Electronically Signed   By: Ilona Sorrel M.D.   On: 06/23/2019 08:57   DG C-Arm 1-60 Min-No Report  Result Date: 06/24/2019 Fluoroscopy was utilized by the requesting physician.  No radiographic interpretation.        Scheduled Meds: . aspirin EC  81 mg Oral Daily  . azithromycin  500 mg Oral Daily  . Chlorhexidine Gluconate Cloth  6 each Topical Daily  . heparin  5,000 Units Subcutaneous Q8H  . insulin aspart  0-5 Units Subcutaneous QHS  . insulin aspart  0-9 Units Subcutaneous TID WC  . insulin aspart  4 Units Subcutaneous TID WC  . insulin glargine  50 Units Subcutaneous QHS  . mouth rinse  15 mL Mouth Rinse BID  . sodium chloride flush  3 mL Intravenous Q12H  . tamsulosin  0.4 mg Oral QPC supper   Continuous Infusions: . sodium chloride 125 mL/hr at 06/24/19 0500  . cefTRIAXone (ROCEPHIN)  IV Stopped (06/23/19 2217)     LOS: 5 days    Time spent:  >9mn    Dowell Hoon, MD [facility]   To contact the attending provider between 7A-7P or the covering provider during after hours 7P-7A, please log into the web site [email or web address] and access using universal [place] password for that web site. If you do not have the password, please call the hospital operator.  06/24/2019, 12:18 PM

## 2019-06-24 NOTE — Anesthesia Preprocedure Evaluation (Signed)
Anesthesia Evaluation  Patient identified by MRN, date of birth, ID band Patient awake    Reviewed: Allergy & Precautions, NPO status , Patient's Chart, lab work & pertinent test results  History of Anesthesia Complications Negative for: history of anesthetic complications  Airway Mallampati: II  TM Distance: >3 FB Neck ROM: Full    Dental  (+) Poor Dentition   Pulmonary neg pulmonary ROS, Current Smoker,    Pulmonary exam normal        Cardiovascular hypertension, + CAD, + Past MI (2011) and + Cardiac Stents  Normal cardiovascular exam     Neuro/Psych negative neurological ROS  negative psych ROS   GI/Hepatic negative GI ROS, Neg liver ROS,   Endo/Other  diabetes, Type 2, Insulin Dependent, Oral Hypoglycemic Agents  Renal/GU ARFRenal disease (left ureteral obstruction) Bladder dysfunction (neurogenic bladder)      Musculoskeletal negative musculoskeletal ROS (+)   Abdominal   Peds  Hematology  (+) anemia ,   Anesthesia Other Findings  Urosepsis, klebsiella bacteremia, 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 The basal inferior wall was severely hypokinetic to akinetic, the mid inferior wall was hypokinetic. The posterior wall was probably mildly hypokinetic. Valves OK.   Reproductive/Obstetrics                             Anesthesia Physical Anesthesia Plan  ASA: III and emergent  Anesthesia Plan: General   Post-op Pain Management:    Induction: Intravenous  PONV Risk Score and Plan: 1 and Ondansetron, Dexamethasone, Treatment may vary due to age or medical condition and Midazolam  Airway Management Planned: Oral ETT  Additional Equipment: None  Intra-op Plan:   Post-operative Plan: Extubation in OR  Informed Consent: I have reviewed the patients History and Physical, chart, labs and discussed the procedure including the risks, benefits and alternatives  for the proposed anesthesia with the patient or authorized representative who has indicated his/her understanding and acceptance.     Dental advisory given  Plan Discussed with:   Anesthesia Plan Comments:         Anesthesia Quick Evaluation

## 2019-06-24 NOTE — Transfer of Care (Signed)
Immediate Anesthesia Transfer of Care Note  Patient: Shane Bond  Procedure(s) Performed: CYSTOSCOPY WITH STENT PLACEMENT LEFT URETER WITH LEFT RETROGRADE URETERAL (Left Ureter)  Patient Location: PACU  Anesthesia Type:General  Level of Consciousness: awake, alert  and patient cooperative  Airway & Oxygen Therapy: Patient Spontanous Breathing and Patient connected to face mask oxygen  Post-op Assessment: Report given to RN and Post -op Vital signs reviewed and stable  Post vital signs: Reviewed and stable  Last Vitals:  Vitals Value Taken Time  BP 107/61 06/24/19 1055  Temp    Pulse 76 06/24/19 1056  Resp 15 06/24/19 1056  SpO2 93 % 06/24/19 1056  Vitals shown include unvalidated device data.  Last Pain:  Vitals:   06/24/19 0916  TempSrc: Oral  PainSc: 0-No pain         Complications: No apparent anesthesia complications

## 2019-06-24 NOTE — Interval H&P Note (Signed)
History and Physical Interval Note:  06/24/2019 9:38 AM  Shane Bond  has presented today for surgery, with the diagnosis of left ureteral obstruction.  The various methods of treatment have been discussed with the patient and family. After consideration of risks, benefits and other options for treatment, the patient has consented to  Procedure(s): Braxton (Left) as a surgical intervention.  The patient's history has been reviewed, patient examined, no change in status, stable for surgery.  I have reviewed the patient's chart and labs.  Questions were answered to the patient's satisfaction.     Shane Bond

## 2019-06-24 NOTE — H&P (View-Only) (Signed)
Urology Consult   Physician requesting consult: Mikki Harbor, MD  Reason for consult: emphysematous pyelitis   History of Present Illness: Shane Bond is a 59 y.o. male who has been admitted to the hospital since 06/19/19 with urosepsis and klebsiella bacteremia, generalized weakness, fevers, acute renal failure, chills and left sided flank pain.  CT from 5/2 showed multiple obstructing proximal ureteral calculi with gas in the collecting system consistent with emphysematous pyelitis.    He has a PMHX of CAD, DM2, HTN, HLD, a neurogenic bladder following a back injury (requires CIC) and kidney stones (required operative mgmt several years ago).  He is currently being followed by a urologist in [facility], but the patient cannot remember his/her name.  He currently has an indwelling Foley in place that is draining cloudy urine.     Past Medical History:  Diagnosis Date  . 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 Bladder atonia   . BPH (benign prostatic hyperplasia)   . CAD, NATIVE VESSEL 10/29/2009  . Diabetes mellitus type 2, uncontrolled, with complications ([place])   . Hyperlipidemia LDL goal <70   . Kidney stone   . OBESITY 10/22/2009    Past Surgical History:  Procedure Laterality Date  . BACK SURGERY    . CORONARY STENT PLACEMENT    . CYSTOSCOPY W/ URETERAL STENT PLACEMENT  02/26/2012   Procedure: CYSTOSCOPY WITH RETROGRADE PYELOGRAM/URETERAL STENT PLACEMENT;  Surgeon: Bernestine Amass, MD;  Location: [facility];  Service: Urology;  Laterality: Left;    Current Hospital Medications:  Home Meds:  Current Meds  Medication Sig  . Ascorbic Acid (VITAMIN C PO) Take 1 tablet by mouth daily.  [name] aspirin EC 81 MG tablet Take 81 mg by mouth daily.  [name] gemfibrozil (LOPID) 600 MG tablet Take 1 tablet (600 mg total) by mouth 2 (two) times daily before a meal.  . LANTUS SOLOSTAR 100 UNIT/ML Solostar Pen INJECT 50 UNITS INTO THE SKIN DAILY AT 10 PM. (Patient taking differently: Inject 50 Units into  the skin at bedtime. )  . liraglutide (VICTOZA) 18 MG/3ML SOPN INJECT 1.8MG  INTO THE SKIN ONCE DAILY  . lisinopril (ZESTRIL) 5 MG tablet Take 1 tablet (5 mg total) by mouth daily.  . metFORMIN (GLUCOPHAGE) 1000 MG tablet Take 1 tablet (1,000 mg total) by mouth 2 (two) times daily with a meal.  . omega-3 acid ethyl esters (LOVAZA) 1 g capsule Take 1 capsule (1 g total) by mouth 2 (two) times daily.  . [DISCONTINUED] amoxicillin-clavulanate (AUGMENTIN) 875-125 MG tablet Take 1 tablet by mouth 2 (two) times daily.    Scheduled Meds: . aspirin EC  81 mg Oral Daily  . azithromycin  500 mg Oral Daily  . Chlorhexidine Gluconate Cloth  6 each Topical Daily  . heparin  5,000 Units Subcutaneous Q8H  . insulin aspart  0-5 Units Subcutaneous QHS  . insulin aspart  0-9 Units Subcutaneous TID WC  . insulin aspart  4 Units Subcutaneous TID WC  . insulin glargine  50 Units Subcutaneous QHS  . mouth rinse  15 mL Mouth Rinse BID  . sodium chloride flush  3 mL Intravenous Q12H  . tamsulosin  0.4 mg Oral QPC supper   Continuous Infusions: . sodium chloride 125 mL/hr at 06/24/19 0500  . cefTRIAXone (ROCEPHIN)  IV Stopped (06/23/19 2217)   PRN Meds:.acetaminophen **OR** acetaminophen, levalbuterol, lip balm, ondansetron **OR** ondansetron (ZOFRAN) IV  Allergies:  Allergies  Allergen Reactions  . Lipitor [Atorvastatin] Rash    Family History  Problem Relation Age of Onset  . Diabetes Father   . Coronary artery disease Other   . Heart attack Other   . Diabetes Paternal Aunt     Social History:  reports that he has been smoking e-cigarettes. He has quit using smokeless tobacco. He reports that he does not drink alcohol or use drugs.  ROS: A complete review of systems was performed.  All systems are negative except for pertinent findings as noted.  Physical Exam:  Vital signs in last 24 hours: Temp:  [98.3 F (36.8 C)-98.8 F (37.1 C)] 98.8 F (37.1 C) (05/03 0400) Pulse Rate:  [58-132] 65  (05/03 0755) Resp:  [14-36] 18 (05/03 0755) BP: (102-147)/(46-71) 136/70 (05/03 0755) SpO2:  [81 %-100 %] 100 % (05/03 0755) Weight:  [121.5 kg] 121.5 kg (05/03 0500) Constitutional:  Alert and oriented, No acute distress Cardiovascular: Regular rate and rhythm, No JVD Respiratory: Normal respiratory effort, Lungs clear bilaterally GI: Abdomen is soft, nontender, nondistended, no abdominal masses GU: No CVA tenderness Lymphatic: No lymphadenopathy Neurologic: Grossly intact, no focal deficits Psychiatric: Normal mood and affect  Laboratory Data:  Recent Labs    06/22/19 0535 06/22/19 1910 06/23/19 0118 06/23/19 0742 06/24/19 0309  WBC 7.1 7.4 6.6 5.9 6.1  HGB 9.8* 9.7* 10.1* 10.1* 9.3*  HCT 28.9* 29.5* 31.4* 31.2* 28.9*  PLT 122* 130* 132* 122* 162    Recent Labs    06/22/19 0535 06/22/19 1910 06/23/19 0118 06/23/19 0742 06/24/19 0309  NA 133* 135 139 138 138  K 3.8 3.4* 4.1 3.9 3.6  CL 108 110 113* 111 109  GLUCOSE 275* 239* 172* 276* 151*  BUN 22* 22* 20 19 15   CALCIUM 7.9* 8.1* 8.0* 8.3* 8.1*  CREATININE 1.22 1.23 1.13 1.12 1.03     Results for orders placed or performed during the hospital encounter of 06/19/19 (from the past 24 hour(s))  Glucose, capillary     Status: Abnormal   Collection Time: 06/23/19  9:48 AM  Result Value Ref Range   Glucose-Capillary 213 (H) 70 - 99 mg/dL   Comment 1 Notify RN    Comment 2 Document in Chart   Glucose, capillary     Status: Abnormal   Collection Time: 06/23/19 10:23 AM  Result Value Ref Range   Glucose-Capillary 214 (H) 70 - 99 mg/dL  Glucose, capillary     Status: Abnormal   Collection Time: 06/23/19  2:09 PM  Result Value Ref Range   Glucose-Capillary 309 (H) 70 - 99 mg/dL   Comment 1 Notify RN    Comment 2 Document in Chart   Glucose, capillary     Status: Abnormal   Collection Time: 06/23/19  5:45 PM  Result Value Ref Range   Glucose-Capillary 180 (H) 70 - 99 mg/dL  Glucose, capillary     Status:  Abnormal   Collection Time: 06/23/19  9:33 PM  Result Value Ref Range   Glucose-Capillary 202 (H) 70 - 99 mg/dL  CBC     Status: Abnormal   Collection Time: 06/24/19  3:09 AM  Result Value Ref Range   WBC 6.1 4.0 - 10.5 K/uL   RBC 2.92 (L) 4.22 - 5.81 MIL/uL   Hemoglobin 9.3 (L) 13.0 - 17.0 g/dL   HCT 28.9 (L) 39.0 - 52.0 %   MCV 99.0 80.0 - 100.0 fL   MCH 31.8 26.0 - 34.0 pg   MCHC 32.2 30.0 - 36.0 g/dL   RDW 14.2 11.5 - 15.5 %   Platelets  162 150 - 400 K/uL   nRBC 0.0 0.0 - 0.2 %  Comprehensive metabolic panel     Status: Abnormal   Collection Time: 06/24/19  3:09 AM  Result Value Ref Range   Sodium 138 135 - 145 mmol/L   Potassium 3.6 3.5 - 5.1 mmol/L   Chloride 109 98 - 111 mmol/L   CO2 22 22 - 32 mmol/L   Glucose, Bld 151 (H) 70 - 99 mg/dL   BUN 15 6 - 20 mg/dL   Creatinine, Ser 1.03 0.61 - 1.24 mg/dL   Calcium 8.1 (L) 8.9 - 10.3 mg/dL   Total Protein 5.7 (L) 6.5 - 8.1 g/dL   Albumin 2.2 (L) 3.5 - 5.0 g/dL   AST 58 (H) 15 - 41 U/L   ALT 77 (H) 0 - 44 U/L   Alkaline Phosphatase 65 38 - 126 U/L   Total Bilirubin 0.6 0.3 - 1.2 mg/dL   GFR calc non Af Amer >60 >60 mL/min   GFR calc Af Amer >60 >60 mL/min   Anion gap 7 5 - 15   Recent Results (from the past 240 hour(s))  Blood Culture (routine x 2)     Status: Abnormal   Collection Time: 06/19/19  8:43 PM   Specimen: BLOOD  Result Value Ref Range Status   Specimen Description   Final    BLOOD LEFT ANTECUBITAL Performed at [facility], [place] [address], [place], [place] [address]    Special Requests   Final    BOTTLES DRAWN AEROBIC AND ANAEROBIC Blood Culture adequate volume Performed at [facility] [address] [place], [place] [address]    Culture  Setup Time   Final    GRAM NEGATIVE RODS IN BOTH AEROBIC AND ANAEROBIC BOTTLES CRITICAL VALUE NOTED.  VALUE IS CONSISTENT WITH PREVIOUSLY REPORTED AND CALLED VALUE.    Culture (A)  Final    KLEBSIELLA  PNEUMONIAE SUSCEPTIBILITIES PERFORMED ON PREVIOUS CULTURE WITHIN THE LAST 5 DAYS. Performed at [facility], [place] [address], [place], [place] [address]    Report Status 06/22/2019 FINAL  Final  Blood Culture (routine x 2)     Status: Abnormal   Collection Time: 06/19/19  8:48 PM   Specimen: BLOOD RIGHT HAND  Result Value Ref Range Status   Specimen Description   Final    BLOOD RIGHT HAND Performed at [facility] [address], [place], [place] [address]    Special Requests   Final    BOTTLES DRAWN AEROBIC AND ANAEROBIC Blood Culture adequate volume Performed at [facility] [address] [place], [place] [address]    Culture  Setup Time   Final    GRAM NEGATIVE RODS IN BOTH AEROBIC AND ANAEROBIC BOTTLES CRITICAL RESULT CALLED TO, READ BACK BY AND VERIFIED WITH: Christean Grief PharmD 16:25 06/20/19 ([email or web address]) Performed at [facility], [place] [address] [place], [place] [address]    Culture KLEBSIELLA PNEUMONIAE (A)  Final   Report Status 06/22/2019 FINAL  Final   Organism ID, Bacteria KLEBSIELLA PNEUMONIAE  Final      Susceptibility   Klebsiella pneumoniae - MIC*    AMPICILLIN >=32 RESISTANT Resistant     CEFAZOLIN <=4 SENSITIVE Sensitive     CEFEPIME <=1 SENSITIVE Sensitive     CEFTAZIDIME <=1 SENSITIVE Sensitive     CEFTRIAXONE <=1 SENSITIVE Sensitive     CIPROFLOXACIN <=0.25 SENSITIVE Sensitive     GENTAMICIN <=1 SENSITIVE Sensitive  IMIPENEM <=0.25 SENSITIVE Sensitive     TRIMETH/SULFA 80 RESISTANT Resistant     AMPICILLIN/SULBACTAM 8 SENSITIVE Sensitive     PIP/TAZO <=4 SENSITIVE Sensitive     * KLEBSIELLA PNEUMONIAE  Blood Culture ID Panel (Reflexed)     Status: Abnormal   Collection Time: 06/19/19  8:48 PM  Result Value Ref Range Status   Enterococcus species NOT DETECTED NOT DETECTED Final   Listeria monocytogenes NOT DETECTED NOT DETECTED Final   Staphylococcus species NOT DETECTED NOT DETECTED Final    Staphylococcus aureus (BCID) NOT DETECTED NOT DETECTED Final   Streptococcus species NOT DETECTED NOT DETECTED Final   Streptococcus agalactiae NOT DETECTED NOT DETECTED Final   Streptococcus pneumoniae NOT DETECTED NOT DETECTED Final   Streptococcus pyogenes NOT DETECTED NOT DETECTED Final   Acinetobacter baumannii NOT DETECTED NOT DETECTED Final   Enterobacteriaceae species DETECTED (A) NOT DETECTED Final    Comment: Enterobacteriaceae represent a large family of gram-negative bacteria, not a single organism. CRITICAL RESULT CALLED TO, READ BACK BY AND VERIFIED WITH: Christean Grief PharmD 16:25 06/20/19 ([email or web address])    Enterobacter cloacae complex NOT DETECTED NOT DETECTED Final   Escherichia coli NOT DETECTED NOT DETECTED Final   Klebsiella oxytoca NOT DETECTED NOT DETECTED Final   Klebsiella pneumoniae DETECTED (A) NOT DETECTED Final    Comment: CRITICAL RESULT CALLED TO, READ BACK BY AND VERIFIED WITH: Christean Grief PharmD 16:25 06/20/19 ([email or web address])    Proteus species NOT DETECTED NOT DETECTED Final   Serratia marcescens NOT DETECTED NOT DETECTED Final   Carbapenem resistance NOT DETECTED NOT DETECTED Final   Haemophilus influenzae NOT DETECTED NOT DETECTED Final   Neisseria meningitidis NOT DETECTED NOT DETECTED Final   Pseudomonas aeruginosa NOT DETECTED NOT DETECTED Final   Candida albicans NOT DETECTED NOT DETECTED Final   Candida glabrata NOT DETECTED NOT DETECTED Final   Candida krusei NOT DETECTED NOT DETECTED Final   Candida parapsilosis NOT DETECTED NOT DETECTED Final   Candida tropicalis NOT DETECTED NOT DETECTED Final    Comment: Performed at [facility], [place] [address] [place], [place] [address]  Urine culture     Status: Abnormal   Collection Time: 06/19/19 10:21 PM   Specimen: In/Out Cath Urine  Result Value Ref Range Status   Specimen Description   Final    IN/OUT CATH URINE Performed at [facility] [address] [place], [place] [address]     Special Requests   Final    NONE Performed at [facility], [place] [address] [place], [place] [address]    Culture MULTIPLE SPECIES PRESENT, SUGGEST RECOLLECTION (A)  Final   Report Status 06/20/2019 FINAL  Final  Respiratory Panel by RT PCR (Flu A&B, Covid) - Nasopharyngeal Swab     Status: None   Collection Time: 06/19/19 10:21 PM   Specimen: Nasopharyngeal Swab  Result Value Ref Range Status   SARS Coronavirus 2 by RT PCR NEGATIVE NEGATIVE Final    Comment: (NOTE) SARS-CoV-2 target nucleic acids are NOT DETECTED. The SARS-CoV-2 RNA is generally detectable in upper respiratoy specimens during the acute phase of infection. The lowest concentration of SARS-CoV-2 viral copies this assay can detect is 131 copies/mL. A negative result does not preclude SARS-Cov-2 infection and should not be used as the sole basis for treatment or other patient management decisions. A negative result may occur with  improper specimen collection/handling, submission of specimen other than nasopharyngeal swab, presence of viral mutation(s) within the areas targeted by this  assay, and inadequate number of viral copies (<131 copies/mL). A negative result must be combined with clinical observations, patient history, and epidemiological information. The expected result is Negative. Fact Sheet for Patients:  PinkCheek.be Fact Sheet for Healthcare Providers:  GravelBags.it This test is not yet ap proved or cleared by the [place] FDA and  has been authorized for detection and/or diagnosis of SARS-CoV-2 by FDA under an Emergency Use Authorization (EUA). This EUA will remain  in effect (meaning this test can be used) for the duration of the COVID-19 declaration under Section 564(b)(1) of the Act, 21 U.S.C. section 360bbb-3(b)(1), unless the authorization is terminated or revoked sooner.    Influenza A by PCR NEGATIVE NEGATIVE Final    Influenza B by PCR NEGATIVE NEGATIVE Final    Comment: (NOTE) The Xpert Xpress SARS-CoV-2/FLU/RSV assay is intended as an aid in  the diagnosis of influenza from Nasopharyngeal swab specimens and  should not be used as a sole basis for treatment. Nasal washings and  aspirates are unacceptable for Xpert Xpress SARS-CoV-2/FLU/RSV  testing. Fact Sheet for Patients: PinkCheek.be Fact Sheet for Healthcare Providers: GravelBags.it This test is not yet approved or cleared by the [place] FDA and  has been authorized for detection and/or diagnosis of SARS-CoV-2 by  FDA under an Emergency Use Authorization (EUA). This EUA will remain  in effect (meaning this test can be used) for the duration of the  Covid-19 declaration under Section 564(b)(1) of the Act, 21  U.S.C. section 360bbb-3(b)(1), unless the authorization is  terminated or revoked. Performed at [facility], [place] [address] [place], [place] [address]   Culture, blood (routine x 2)     Status: None (Preliminary result)   Collection Time: 06/21/19  7:57 PM   Specimen: BLOOD  Result Value Ref Range Status   Specimen Description   Final    BLOOD RIGHT ARM Performed at [facility] [address], [place], [place] [address]    Special Requests   Final    BOTTLES DRAWN AEROBIC AND ANAEROBIC Blood Culture adequate volume Performed at [facility] [address] [place], [place] [address]    Culture   Final    NO GROWTH 2 DAYS Performed at [facility] [address] [place], [place] [address]    Report Status PENDING  Incomplete  Culture, blood (routine x 2)     Status: None (Preliminary result)   Collection Time: 06/21/19  8:02 PM   Specimen: BLOOD  Result Value Ref Range Status   Specimen Description   Final    BLOOD RIGHT HAND Performed at [facility] [address],  [place], [place] [address]    Special Requests   Final    BOTTLES DRAWN AEROBIC AND ANAEROBIC Blood Culture adequate volume Performed at [facility] [address], [place], [place] [address]    Culture   Final    NO GROWTH 2 DAYS Performed at [facility] [address] [place],  [address]    Report Status PENDING  Incomplete  MRSA PCR Screening     Status: None   Collection Time: 06/22/19  5:27 PM   Specimen: Nasal Mucosa; Nasopharyngeal  Result Value Ref Range Status   MRSA by PCR NEGATIVE NEGATIVE Final    Comment:        The GeneXpert MRSA Assay (FDA approved for NASAL specimens only), is one component of a comprehensive MRSA colonization  surveillance program. It is not intended to diagnose MRSA infection nor to guide or monitor treatment for MRSA infections. Performed at [facility], [place] [address], [place], [place] [address]   Culture, blood (routine x 2)     Status: None (Preliminary result)   Collection Time: 06/22/19  7:10 PM   Specimen: BLOOD LEFT HAND  Result Value Ref Range Status   Specimen Description   Final    BLOOD LEFT HAND Performed at [facility] [address] [place], [place] [address]    Special Requests   Final    BOTTLES DRAWN AEROBIC ONLY Blood Culture adequate volume   Culture   Final    NO GROWTH < 12 HOURS Performed at [facility], [place] [address], [place], [place] [address]    Report Status PENDING  Incomplete  Culture, blood (routine x 2)     Status: None (Preliminary result)   Collection Time: 06/22/19  7:10 PM   Specimen: BLOOD LEFT HAND  Result Value Ref Range Status   Specimen Description   Final    BLOOD LEFT HAND Performed at [facility] [address], [place], [place] [address]    Special Requests   Final    BOTTLES DRAWN AEROBIC ONLY Blood Culture adequate volume Performed at [facility] [address], [place], [place] [address]    Culture   Final    NO GROWTH < 12 HOURS Performed at [facility] [address], [place], [place] [address]    Report Status PENDING  Incomplete    Renal Function: Recent Labs    06/20/19 0500 06/21/19 0440 06/22/19 0535 06/22/19 1910 06/23/19 0118 06/23/19 0742 06/24/19 0309  CREATININE 2.15* 1.53* 1.22 1.23 1.13 1.12 1.03   Estimated Creatinine Clearance: 108.2 mL/min (by C-G formula based on SCr of 1.03 mg/dL).  Radiologic Imaging: CT ABDOMEN PELVIS WO CONTRAST  Result Date: 06/23/2019 CLINICAL DATA:  Sepsis. EXAM: CT CHEST, ABDOMEN AND PELVIS WITHOUT CONTRAST TECHNIQUE: Multidetector CT imaging of the chest, abdomen and pelvis was performed following the standard protocol without IV contrast. COMPARISON:  None. Chest radiograph earlier today and yesterday. Noncontrast abdominal CT 02/19/2019 FINDINGS: CT CHEST FINDINGS Cardiovascular: Atherosclerosis of the thoracic aorta. No aortic aneurysm. Mild aortic tortuosity. Coronary artery calcifications. Borderline mild cardiomegaly. Small pericardial effusion measuring up to 13 mm adjacent to the left atrium. Mediastinum/Nodes: Lack of contrast and patient motion limits detailed assessment for adenopathy. Allowing for these limitations, no bulky mediastinal or gross hilar adenopathy. No esophageal wall thickening. No visualized thyroid nodule. Lungs/Pleura: Small bilateral pleural effusions. Streaky opacities in the left lower lobe consistent with atelectasis. There are vague scattered subpleural ground-glass opacities in the left upper lobe, series 507 image 48, 58, and 65. Minimal subpleural ground-glass opacity in the right upper lobe, series 507 image 65 and 78. No confluent airspace disease. The trachea and mainstem bronchi are patent. No evidence of pulmonary mass. 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 Posterior disc osteophyte complex at T11-T12 causes mass  effect on the spinal canal. There are no acute or suspicious osseous abnormalities. CT ABDOMEN PELVIS FINDINGS Hepatobiliary: Prominent size liver spanning 20 cm cranial caudal. No focal lesion on noncontrast exam. Gallbladder is partially distended. There is no calcified gallstone or biliary dilatation. No pericholecystic inflammation. Pancreas: No ductal dilatation or inflammation. Spleen: Normal in size without focal abnormality. Adrenals/Urinary Tract: Normal adrenal glands. Mild left hydroureteronephrosis. There are  2 adjacent stones in the left proximal ureter just beyond the ureteropelvic junction measuring 6 x 10 mm and 5 x 5 mm. The ureter distal to the stones is also dilated to the bladder insertion. There are innumerable layering stones in the urinary bladder which extend to the region of the left trigone. Multiple additional nonobstructing stones throughout the left kidney. Mild left perinephric edema. There is air within the left renal pelvis and lower pole calyx. There is no evidence of parenchymal air. Foley catheter decompresses the urinary bladder with bladder air. 3.3 cm cyst in the upper right kidney. There is no right hydronephrosis. No right renal calculi. Mild right perinephric edema also seen on prior exam. No right ureteral calculi. Stomach/Bowel: Bowel evaluation is limited in the absence of enteric contrast. Stomach partially distended. No small bowel dilatation to suggest obstruction. No obvious bowel inflammation. The sigmoid colon is redundant coursing into the upper abdomen. Small to moderate volume of stool in the colon. There is minimal liquid stool in the transverse colon without colonic wall thickening or pericolonic edema. The appendix is not well-defined. Vascular/Lymphatic: Aortic atherosclerosis, age advanced. No aortic aneurysm. No bulky abdominopelvic adenopathy. Reproductive: Prostate is unremarkable. Other: Left greater than right retroperitoneal edema tracking into the  pelvis. Trace pelvic free fluid. Patchy subcutaneous edema involving the left flank. Supraumbilical ventral abdominal wall hernia just to the left of midline contains fat. There is also a small fat containing umbilical hernia Musculoskeletal: There are no acute or suspicious osseous abnormalities. Multilevel degenerative change in the lumbar spine. IMPRESSION: 1. Moderate left hydroureteronephrosis. There are 2 adjacent stones in the left proximal ureter, however the ureter is also dilated distal to the stones, and it is unclear if the stones are course of renal obstruction. There are numerable dependent stones in the urinary bladder, including in the region of the urinary trigone. 2. Left perinephric edema, which may be related to obstructive uropathy, however there is air in the left renal pelvis in the lower pole calyx. This may be ascending related to Foley catheter placement versus infection and emphysematous pyelitis. There is no renal parenchymal air. 3. Small bilateral pleural effusions. Streaky opacities in the left lower lobe consistent with atelectasis. 4. Scattered subpleural ground-glass opacities in both upper lobes are nonspecific, may be infectious or inflammatory. Consider follow-up chest CT in 12 months to ensure resolution. 5. Small pericardial effusion. Aortic Atherosclerosis (ICD10-I70.0) . Electronically Signed   By: Keith Rake M.D.   On: 06/23/2019 17:13   DG Chest 2 View  Result Date: 06/22/2019 CLINICAL DATA:  Oxygen desaturation, shortness of breath EXAM: CHEST - 2 VIEW COMPARISON:  06/19/2019 FINDINGS: The heart size and mediastinal contours are within normal limits. Improved lung volumes and aeration of the lungs. Disc degenerative disease of the thoracic spine. IMPRESSION: Improved lung volumes and aeration of the lungs compared to prior examination. No acute abnormality of the lungs. Electronically Signed   By: Eddie Candle M.D.   On: 06/22/2019 17:38   CT CHEST WO  CONTRAST  Result Date: 06/23/2019 CLINICAL DATA:  Sepsis. EXAM: CT CHEST, ABDOMEN AND PELVIS WITHOUT CONTRAST TECHNIQUE: Multidetector CT imaging of the chest, abdomen and pelvis was performed following the standard protocol without IV contrast. COMPARISON:  None. Chest radiograph earlier today and yesterday. Noncontrast abdominal CT 02/19/2019 FINDINGS: CT CHEST FINDINGS Cardiovascular: Atherosclerosis of the thoracic aorta. No aortic aneurysm. Mild aortic tortuosity. Coronary artery calcifications. Borderline mild cardiomegaly. Small pericardial effusion measuring up to 13 mm adjacent to  the left atrium. Mediastinum/Nodes: Lack of contrast and patient motion limits detailed assessment for adenopathy. Allowing for these limitations, no bulky mediastinal or gross hilar adenopathy. No esophageal wall thickening. No visualized thyroid nodule. Lungs/Pleura: Small bilateral pleural effusions. Streaky opacities in the left lower lobe consistent with atelectasis. There are vague scattered subpleural ground-glass opacities in the left upper lobe, series 507 image 48, 58, and 65. Minimal subpleural ground-glass opacity in the right upper lobe, series 507 image 65 and 78. No confluent airspace disease. The trachea and mainstem bronchi are patent. No evidence of pulmonary mass. 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 Posterior disc osteophyte complex at T11-T12 causes mass effect on the spinal canal. There are no acute or suspicious osseous abnormalities. CT ABDOMEN PELVIS FINDINGS Hepatobiliary: Prominent size liver spanning 20 cm cranial caudal. No focal lesion on noncontrast exam. Gallbladder is partially distended. There is no calcified gallstone or biliary dilatation. No pericholecystic inflammation. Pancreas: No ductal dilatation or inflammation. Spleen: Normal in size without focal abnormality. Adrenals/Urinary Tract: Normal adrenal glands. Mild left hydroureteronephrosis. There  are 2 adjacent stones in the left proximal ureter just beyond the ureteropelvic junction measuring 6 x 10 mm and 5 x 5 mm. The ureter distal to the stones is also dilated to the bladder insertion. There are innumerable layering stones in the urinary bladder which extend to the region of the left trigone. Multiple additional nonobstructing stones throughout the left kidney. Mild left perinephric edema. There is air within the left renal pelvis and lower pole calyx. There is no evidence of parenchymal air. Foley catheter decompresses the urinary bladder with bladder air. 3.3 cm cyst in the upper right kidney. There is no right hydronephrosis. No right renal calculi. Mild right perinephric edema also seen on prior exam. No right ureteral calculi. Stomach/Bowel: Bowel evaluation is limited in the absence of enteric contrast. Stomach partially distended. No small bowel dilatation to suggest obstruction. No obvious bowel inflammation. The sigmoid colon is redundant coursing into the upper abdomen. Small to moderate volume of stool in the colon. There is minimal liquid stool in the transverse colon without colonic wall thickening or pericolonic edema. The appendix is not well-defined. Vascular/Lymphatic: Aortic atherosclerosis, age advanced. No aortic aneurysm. No bulky abdominopelvic adenopathy. Reproductive: Prostate is unremarkable. Other: Left greater than right retroperitoneal edema tracking into the pelvis. Trace pelvic free fluid. Patchy subcutaneous edema involving the left flank. Supraumbilical ventral abdominal wall hernia just to the left of midline contains fat. There is also a small fat containing umbilical hernia Musculoskeletal: There are no acute or suspicious osseous abnormalities. Multilevel degenerative change in the lumbar spine. IMPRESSION: 1. Moderate left hydroureteronephrosis. There are 2 adjacent stones in the left proximal ureter, however the ureter is also dilated distal to the stones, and it is  unclear if the stones are course of renal obstruction. There are numerable dependent stones in the urinary bladder, including in the region of the urinary trigone. 2. Left perinephric edema, which may be related to obstructive uropathy, however there is air in the left renal pelvis in the lower pole calyx. This may be ascending related to Foley catheter placement versus infection and emphysematous pyelitis. There is no renal parenchymal air. 3. Small bilateral pleural effusions. Streaky opacities in the left lower lobe consistent with atelectasis. 4. Scattered subpleural ground-glass opacities in both upper lobes are nonspecific, may be infectious or inflammatory. Consider follow-up chest CT in 12 months to ensure resolution. 5. Small pericardial effusion. Aortic Atherosclerosis (ICD10-I70.0) .  Electronically Signed   By: Keith Rake M.D.   On: 06/23/2019 17:13   DG Chest Port 1 View  Result Date: 06/23/2019 CLINICAL DATA:  Respiratory failure EXAM: PORTABLE CHEST 1 VIEW COMPARISON:  Chest radiograph from one day prior. FINDINGS: Stable cardiomediastinal silhouette with top-normal heart size. No pneumothorax. No pleural effusion. Stable minimal scarring versus atelectasis at the left costophrenic angle. No pulmonary edema. No acute consolidative airspace disease. IMPRESSION: Stable minimal scarring versus atelectasis at the left costophrenic angle. Otherwise no active disease. Electronically Signed   By: Ilona Sorrel M.D.   On: 06/23/2019 08:57    I independently reviewed the above imaging studies.  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   -The risks, benefits and alternatives of cystoscopy with LEFT JJ stent placement was discussed  with the patient.  Risks include, but are not limited to: bleeding, urinary tract infection, ureteral injury, ureteral stricture disease, chronic pain, urinary symptoms, bladder injury, stent migration, the need for nephrostomy tube placement, MI, CVA, DVT, PE and the inherent risks with general anesthesia.  The patient voices understanding and wishes to proceed.   The patient will be urgently taken to the OR today. NPO. Hold aspirin.   Ellison Hughs, MD [facility] 06/24/2019, 8:13 AM

## 2019-06-24 NOTE — Anesthesia Procedure Notes (Signed)
Procedure Name: Intubation Date/Time: 06/24/2019 9:52 AM Performed by: Eben Burow, CRNA Pre-anesthesia Checklist: Patient identified, Emergency Drugs available, Suction available, Patient being monitored and Timeout performed Patient Re-evaluated:Patient Re-evaluated prior to induction Oxygen Delivery Method: Circle system utilized Preoxygenation: Pre-oxygenation with 100% oxygen Induction Type: IV induction Ventilation: Mask ventilation without difficulty Laryngoscope Size: Mac and 4 Grade View: Grade I Tube type: Oral Tube size: 7.5 mm Number of attempts: 1 Airway Equipment and Method: Stylet Placement Confirmation: ETT inserted through vocal cords under direct vision,  positive ETCO2 and breath sounds checked- equal and bilateral Secured at: 23 cm Tube secured with: Tape Dental Injury: Teeth and Oropharynx as per pre-operative assessment

## 2019-06-24 NOTE — Op Note (Addendum)
Operative Note  Preoperative diagnosis:  1.  Obstructing proximal left ureteral calculi 2.  Klebsiella bacteremia and urosepsis 3.  Acute renal failure  Postoperative diagnosis: 1.  Obstructing proximal left ureteral calculi 2.  Klebsiella bacteremia and urosepsis 3.  Acute renal failure 4.  Partially duplicated left collecting system 5.  Multiple bladder stones  Procedure(s): 1.  Cystoscopy with left JJ stent placement 2.  Left retrograde pyelogram with intraoperative interpretation of fluoroscopic imaging  Surgeon: Ellison Hughs, MD  Assistants:  None  Anesthesia:  General  Complications:  None  EBL: Less than 5 mL  Specimens: 1.  None  Drains/Catheters: 1.  Left 6 French, 26 cm JJ stent was put in the lower pole moiety of his partially duplicated left collecting system 2.  16 French Foley catheter  Intraoperative findings:   1. Partially duplicated left collecting system was seen on retrograde pyelogram.  The lower pole moiety was markedly dilated compared to the upper pole moiety.  There was no clear filling defect suggestive of his proximal stones that were seen on recent CT, but given the dilation of the lower pole moiety, the decision was made to place the stent and that portion of his collecting system.  Indication:  Shane Bond is a 59 y.o. male with an obstructing proximal left ureteral calculus along with Klebsiella bacteremia and urosepsis.  He has been consented for the above procedures, voices understanding and wishes to proceed.  Description of procedure:  After informed consent was obtained, the patient was brought to the operating room and general LMA anesthesia was administered. The patient was then placed in the dorsolithotomy position and prepped and draped in the usual sterile fashion. A timeout was performed. A 23 French rigid cystoscope was then inserted into the urethral meatus and advanced into the bladder under direct vision. A complete  bladder survey revealed multiple 1 cm bladder stones.  A 5 French ureteral catheter was then inserted into the left ureteral orifice and a retrograde pyelogram was obtained, with the findings listed above.  A Glidewire was then used to intubate the lumen of the ureteral catheter and was advanced up to the left renal pelvis, under fluoroscopic guidance.  The catheter was then removed, leaving the wire in place.  I was able to drain only a few of his multiple bladder stones.   A 6 French, 26 cm JJ stent was then placed into good position within the lower pole moiety of his partially duplicated left collecting system.  A 16 French Foley catheter was then placed and the balloon was inflated with 10 mL of sterile water.  Catheter was placed to gravity drainage.  He tolerated the procedure well and was transferred to the postanesthesia in stable condition.  Plan: I will attempt to get his records from his urologist in [facility] and plan follow-up with them accordingly for definitive management of his left sided ureteral stone as well as his multiple bladder stones.  Continue culture specific antibiotics for a total of 14 days.

## 2019-06-25 LAB — CBC
HCT: 31.7 % — ABNORMAL LOW (ref 39.0–52.0)
Hemoglobin: 10.2 g/dL — ABNORMAL LOW (ref 13.0–17.0)
MCH: 32.1 pg (ref 26.0–34.0)
MCHC: 32.2 g/dL (ref 30.0–36.0)
MCV: 99.7 fL (ref 80.0–100.0)
Platelets: 224 10*3/uL (ref 150–400)
RBC: 3.18 MIL/uL — ABNORMAL LOW (ref 4.22–5.81)
RDW: 14.1 % (ref 11.5–15.5)
WBC: 5.9 10*3/uL (ref 4.0–10.5)
nRBC: 0 % (ref 0.0–0.2)

## 2019-06-25 LAB — COMPREHENSIVE METABOLIC PANEL
ALT: 80 U/L — ABNORMAL HIGH (ref 0–44)
AST: 50 U/L — ABNORMAL HIGH (ref 15–41)
Albumin: 2.2 g/dL — ABNORMAL LOW (ref 3.5–5.0)
Alkaline Phosphatase: 75 U/L (ref 38–126)
Anion gap: 7 (ref 5–15)
BUN: 17 mg/dL (ref 6–20)
CO2: 22 mmol/L (ref 22–32)
Calcium: 8.5 mg/dL — ABNORMAL LOW (ref 8.9–10.3)
Chloride: 108 mmol/L (ref 98–111)
Creatinine, Ser: 0.97 mg/dL (ref 0.61–1.24)
GFR calc Af Amer: >60 mL/min (ref >60)
GFR calc non Af Amer: >60 mL/min (ref >60)
Glucose, Bld: 270 mg/dL — ABNORMAL HIGH (ref 70–99)
Potassium: 4.1 mmol/L (ref 3.5–5.1)
Sodium: 137 mmol/L (ref 135–145)
Total Bilirubin: 0.5 mg/dL (ref 0.3–1.2)
Total Protein: 5.9 g/dL — ABNORMAL LOW (ref 6.5–8.1)

## 2019-06-25 LAB — FOLATE RBC
Folate, Hemolysate: 429 ng/mL
Folate, RBC: 1571 ng/mL (ref >498)
Hematocrit: 27.3 % — ABNORMAL LOW (ref 37.5–51.0)

## 2019-06-25 LAB — GLUCOSE, CAPILLARY
Glucose-Capillary: 193 mg/dL — ABNORMAL HIGH (ref 70–99)
Glucose-Capillary: 294 mg/dL — ABNORMAL HIGH (ref 70–99)

## 2019-06-25 MED ORDER — AMOXICILLIN-POT CLAVULANATE 875-125 MG PO TABS: 1.0000 | ORAL_TABLET | Freq: Two times a day (BID) | ORAL | 0 refills | Status: DC

## 2019-06-25 MED ORDER — AMOXICILLIN-POT CLAVULANATE 875-125 MG PO TABS
1.0000 | ORAL_TABLET | Freq: Two times a day (BID) | ORAL | Status: DC
  Administered 2019-06-25: 1 via ORAL
  Filled 2019-06-25: qty 1

## 2019-06-25 MED FILL — AMOX-CLAV 875-125 MG TABLET: 875-125 | 10 days supply | Qty: 20 | Fill #0

## 2019-06-25 NOTE — Progress Notes (Signed)
Pt discharged. PIVs removed. AVS went over with pt and pts sister. They stated they had no questions or concerns. Pt wheeled down by nurse tech. All belongings were returned.

## 2019-06-25 NOTE — TOC Transition Note (Signed)
Transition of Care [facility]) - CM/SW Discharge Note   Patient Details  Name: Shane Bond MRN: [id number] Date of Birth: January 24, 1961  [facility] of Care [facility]) CM/SW Contact:  Leeroy Cha, RN Phone Number: 06/25/2019, 11:42 AM   Clinical Narrative:    dcd to home   Final next level of care: Home/Self Care Barriers to Discharge: No Barriers Identified   Patient Goals and CMS Choice Patient states their goals for this hospitalization and ongoing recovery are:: to go home CMS Medicare.gov Compare Post Acute Care list provided to:: Patient    Discharge Placement                       Discharge Plan and Services                                     Social Determinants of Health (SDOH) Interventions     Readmission Risk Interventions No flowsheet data found.

## 2019-06-25 NOTE — Discharge Summary (Signed)
Physician Discharge Summary  Shane Bond [id number] DOB: [birth year] DOA: 06/19/2019  PCP: Gildardo Pounds, NP  Admit date: 06/19/2019 Discharge date: 06/25/2019  Admitted From: Home Disposition: Home  Recommendations for Outpatient Follow-up:  1. Follow up with PCP in 1-2 weeks 2. Please obtain BMP/CBC in one week 3. Please follow up on the following pending results:  Home Health: No Equipment/Devices: Foley Discharge Condition: Stable CODE STATUS: Full Diet recommendation: Cardiac  Brief/Interim Summary: 59 y/o M with HTN, DM, hypertriglyceridemia, neurogenic bladder post Back Sx/BPH with intermittent self catheterization p/w fall/weakness secondary to urospesis complicated by AKI, hyperkalemia, hyperglycemia. Blood cultures 4/28 with Klebsiella and Enterobacter.  Surveillance blood cultures negative.  On 06/21/19 developed SOB requiring 2 LNC. On 06/22/19 he developed rigors with fever 100.7 and was transferred to step down for monitoring. BCx NTG. CXR showed improvement in opacities. Pulmonary switched Unasyn to CTX.   Patient somnolent on night of 06/23/19 and had an ABG done that was 7.4 1/32/100/20/95%.  Lactic acid 1.1.  BNP 147.  Given recurrent bouts of rigors with negative blood cultures, we proceeded with noncontrast CT chest and abdomen that showed obstructing proximal left ureteral calculi with moderate left-sided hydronephrosis.  It also showed small bilateral pleural effusions however no evidence of pneumonia.  Patient went to the OR 06/24/2019 for cystoscopy with left JJ stent placement.  He was monitored for over 24 hours post procedure.  He has remained afebrile without any leukocytosis or periods of rigors as previously noted.  He was seen by physical therapy and cleared for discharge.  He will be sent home with 10-day course of Augmentin.  Used to work as a Animal nutritionist.  Currently lives with mother.  Plays with model cars airplanes.  Does not use a  cane or walker at home.   Discharge Diagnoses:  Principal Problem:   Sepsis due to urinary tract infection ([place]) Active Problems:   CAD, NATIVE VESSEL   Hypertension associated with diabetes ([place])   Hyperlipidemia associated with type 2 diabetes mellitus ([facility])   BPH (benign prostatic hyperplasia)   Self-catheterizes urinary bladder   Type 2 diabetes mellitus with hyperglycemia, with long-term current use of insulin ([facility])   Hyperkalemia   Acute kidney injury ([place])   Lung infiltrate   Thrombocytopenia ([facility])   Normocytic anemia   Bacteremia due to Klebsiella pneumoniae   Protein-calorie malnutrition, severe ([facility])   Hydronephrosis with renal and ureteral calculus obstruction    Discharge Instructions Patient advised to follow-up with urology.  Advised to return to the ED if develops any fever chills or dysuria.  Allergies as of 06/25/2019      Reactions   Lipitor [atorvastatin] Rash      Medication List    STOP taking these medications   HYDROmorphone 2 MG tablet Commonly known as: Dilaudid   morphine 15 MG tablet Commonly known as: MSIR   ondansetron 4 MG disintegrating tablet Commonly known as: Zofran ODT     TAKE these medications   amoxicillin-clavulanate 875-125 MG tablet Commonly known as: AUGMENTIN Take 1 tablet by mouth 2 (two) times daily.   aspirin EC 81 MG tablet Take 81 mg by mouth daily.   gemfibrozil 600 MG tablet Commonly known as: LOPID Take 1 tablet (600 mg total) by mouth 2 (two) times daily before a meal.   Lantus SoloStar 100 UNIT/ML Solostar Pen Generic drug: insulin glargine INJECT 50 UNITS INTO THE SKIN DAILY AT 10 PM. What changed: See the new instructions.   lisinopril  5 MG tablet Commonly known as: ZESTRIL Take 1 tablet (5 mg total) by mouth daily.   metFORMIN 1000 MG tablet Commonly known as: GLUCOPHAGE Take 1 tablet (1,000 mg total) by mouth 2 (two) times daily with a meal.   Misc. Devices Misc Please provide patient with  standard sized 22F urinary catheters for self cathing along with lubricant.   omega-3 acid ethyl esters 1 g capsule Commonly known as: LOVAZA Take 1 capsule (1 g total) by mouth 2 (two) times daily.   tamsulosin 0.4 MG Caps capsule Commonly known as: FLOMAX Take 1 capsule (0.4 mg total) by mouth daily after supper.   True Metrix Blood Glucose Test test strip Generic drug: glucose blood Use as instructed   TRUEplus Lancets 28G Misc 1 each by Does not apply route 4 (four) times daily - after meals and at bedtime.   TRUEplus Pen Needles 32G X 4 MM Misc Generic drug: Insulin Pen Needle USE AS DIRECTED   Victoza 18 MG/3ML Sopn Generic drug: liraglutide INJECT 1.8MG  INTO THE SKIN ONCE DAILY   VITAMIN C PO Take 1 tablet by mouth daily.      Follow-up Information    Ceasar Mons, MD.   Specialty: Urology Why: As needed Contact information: [address] [place] [address] [phone]          Allergies  Allergen Reactions  . Lipitor [Atorvastatin] Rash    Consultations:  Urology, pulmonary critical care.   Procedures/Studies: CT ABDOMEN PELVIS WO CONTRAST  Result Date: 06/23/2019 CLINICAL DATA:  Sepsis. EXAM: CT CHEST, ABDOMEN AND PELVIS WITHOUT CONTRAST TECHNIQUE: Multidetector CT imaging of the chest, abdomen and pelvis was performed following the standard protocol without IV contrast. COMPARISON:  None. Chest radiograph earlier today and yesterday. Noncontrast abdominal CT 02/19/2019 FINDINGS: CT CHEST FINDINGS Cardiovascular: Atherosclerosis of the thoracic aorta. No aortic aneurysm. Mild aortic tortuosity. Coronary artery calcifications. Borderline mild cardiomegaly. Small pericardial effusion measuring up to 13 mm adjacent to the left atrium. Mediastinum/Nodes: Lack of contrast and patient motion limits detailed assessment for adenopathy. Allowing for these limitations, no bulky mediastinal or gross hilar adenopathy. No esophageal wall  thickening. No visualized thyroid nodule. Lungs/Pleura: Small bilateral pleural effusions. Streaky opacities in the left lower lobe consistent with atelectasis. There are vague scattered subpleural ground-glass opacities in the left upper lobe, series 507 image 48, 58, and 65. Minimal subpleural ground-glass opacity in the right upper lobe, series 507 image 65 and 78. No confluent airspace disease. The trachea and mainstem bronchi are patent. No evidence of pulmonary mass. 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 Posterior disc osteophyte complex at T11-T12 causes mass effect on the spinal canal. There are no acute or suspicious osseous abnormalities. CT ABDOMEN PELVIS FINDINGS Hepatobiliary: Prominent size liver spanning 20 cm cranial caudal. No focal lesion on noncontrast exam. Gallbladder is partially distended. There is no calcified gallstone or biliary dilatation. No pericholecystic inflammation. Pancreas: No ductal dilatation or inflammation. Spleen: Normal in size without focal abnormality. Adrenals/Urinary Tract: Normal adrenal glands. Mild left hydroureteronephrosis. There are 2 adjacent stones in the left proximal ureter just beyond the ureteropelvic junction measuring 6 x 10 mm and 5 x 5 mm. The ureter distal to the stones is also dilated to the bladder insertion. There are innumerable layering stones in the urinary bladder which extend to the region of the left trigone. Multiple additional nonobstructing stones throughout the left kidney. Mild left perinephric edema. There is air within the left  renal pelvis and lower pole calyx. There is no evidence of parenchymal air. Foley catheter decompresses the urinary bladder with bladder air. 3.3 cm cyst in the upper right kidney. There is no right hydronephrosis. No right renal calculi. Mild right perinephric edema also seen on prior exam. No right ureteral calculi. Stomach/Bowel: Bowel evaluation is limited in the absence of  enteric contrast. Stomach partially distended. No small bowel dilatation to suggest obstruction. No obvious bowel inflammation. The sigmoid colon is redundant coursing into the upper abdomen. Small to moderate volume of stool in the colon. There is minimal liquid stool in the transverse colon without colonic wall thickening or pericolonic edema. The appendix is not well-defined. Vascular/Lymphatic: Aortic atherosclerosis, age advanced. No aortic aneurysm. No bulky abdominopelvic adenopathy. Reproductive: Prostate is unremarkable. Other: Left greater than right retroperitoneal edema tracking into the pelvis. Trace pelvic free fluid. Patchy subcutaneous edema involving the left flank. Supraumbilical ventral abdominal wall hernia just to the left of midline contains fat. There is also a small fat containing umbilical hernia Musculoskeletal: There are no acute or suspicious osseous abnormalities. Multilevel degenerative change in the lumbar spine. IMPRESSION: 1. Moderate left hydroureteronephrosis. There are 2 adjacent stones in the left proximal ureter, however the ureter is also dilated distal to the stones, and it is unclear if the stones are course of renal obstruction. There are numerable dependent stones in the urinary bladder, including in the region of the urinary trigone. 2. Left perinephric edema, which may be related to obstructive uropathy, however there is air in the left renal pelvis in the lower pole calyx. This may be ascending related to Foley catheter placement versus infection and emphysematous pyelitis. There is no renal parenchymal air. 3. Small bilateral pleural effusions. Streaky opacities in the left lower lobe consistent with atelectasis. 4. Scattered subpleural ground-glass opacities in both upper lobes are nonspecific, may be infectious or inflammatory. Consider follow-up chest CT in 12 months to ensure resolution. 5. Small pericardial effusion. Aortic Atherosclerosis (ICD10-I70.0) .  Electronically Signed   By: Keith Rake M.D.   On: 06/23/2019 17:13   DG Chest 2 View  Result Date: 06/22/2019 CLINICAL DATA:  Oxygen desaturation, shortness of breath EXAM: CHEST - 2 VIEW COMPARISON:  06/19/2019 FINDINGS: The heart size and mediastinal contours are within normal limits. Improved lung volumes and aeration of the lungs. Disc degenerative disease of the thoracic spine. IMPRESSION: Improved lung volumes and aeration of the lungs compared to prior examination. No acute abnormality of the lungs. Electronically Signed   By: Eddie Candle M.D.   On: 06/22/2019 17:38   CT CHEST WO CONTRAST  Result Date: 06/23/2019 CLINICAL DATA:  Sepsis. EXAM: CT CHEST, ABDOMEN AND PELVIS WITHOUT CONTRAST TECHNIQUE: Multidetector CT imaging of the chest, abdomen and pelvis was performed following the standard protocol without IV contrast. COMPARISON:  None. Chest radiograph earlier today and yesterday. Noncontrast abdominal CT 02/19/2019 FINDINGS: CT CHEST FINDINGS Cardiovascular: Atherosclerosis of the thoracic aorta. No aortic aneurysm. Mild aortic tortuosity. Coronary artery calcifications. Borderline mild cardiomegaly. Small pericardial effusion measuring up to 13 mm adjacent to the left atrium. Mediastinum/Nodes: Lack of contrast and patient motion limits detailed assessment for adenopathy. Allowing for these limitations, no bulky mediastinal or gross hilar adenopathy. No esophageal wall thickening. No visualized thyroid nodule. Lungs/Pleura: Small bilateral pleural effusions. Streaky opacities in the left lower lobe consistent with atelectasis. There are vague scattered subpleural ground-glass opacities in the left upper lobe, series 507 image 48, 58, and 65. Minimal subpleural ground-glass opacity  in the right upper lobe, series 507 image 65 and 78. No confluent airspace disease. The trachea and mainstem bronchi are patent. No evidence of pulmonary mass. Musculoskeletal: Multilevel degenerative disc disease  in the thoracic spine. Posterior disc osteophyte complex at T11-T12 causes mass effect on the spinal canal. There are no acute or suspicious osseous abnormalities. CT ABDOMEN PELVIS FINDINGS Hepatobiliary: Prominent size liver spanning 20 cm cranial caudal. No focal lesion on noncontrast exam. Gallbladder is partially distended. There is no calcified gallstone or biliary dilatation. No pericholecystic inflammation. Pancreas: No ductal dilatation or inflammation. Spleen: Normal in size without focal abnormality. Adrenals/Urinary Tract: Normal adrenal glands. Mild left hydroureteronephrosis. There are 2 adjacent stones in the left proximal ureter just beyond the ureteropelvic junction measuring 6 x 10 mm and 5 x 5 mm. The ureter distal to the stones is also dilated to the bladder insertion. There are innumerable layering stones in the urinary bladder which extend to the region of the left trigone. Multiple additional nonobstructing stones throughout the left kidney. Mild left perinephric edema. There is air within the left renal pelvis and lower pole calyx. There is no evidence of parenchymal air. Foley catheter decompresses the urinary bladder with bladder air. 3.3 cm cyst in the upper right kidney. There is no right hydronephrosis. No right renal calculi. Mild right perinephric edema also seen on prior exam. No right ureteral calculi. Stomach/Bowel: Bowel evaluation is limited in the absence of enteric contrast. Stomach partially distended. No small bowel dilatation to suggest obstruction. No obvious bowel inflammation. The sigmoid colon is redundant coursing into the upper abdomen. Small to moderate volume of stool in the colon. There is minimal liquid stool in the transverse colon without colonic wall thickening or pericolonic edema. The appendix is not well-defined. Vascular/Lymphatic: Aortic atherosclerosis, age advanced. No aortic aneurysm. No bulky abdominopelvic adenopathy. Reproductive: Prostate is  unremarkable. Other: Left greater than right retroperitoneal edema tracking into the pelvis. Trace pelvic free fluid. Patchy subcutaneous edema involving the left flank. Supraumbilical ventral abdominal wall hernia just to the left of midline contains fat. There is also a small fat containing umbilical hernia Musculoskeletal: There are no acute or suspicious osseous abnormalities. Multilevel degenerative change in the lumbar spine. IMPRESSION: 1. Moderate left hydroureteronephrosis. There are 2 adjacent stones in the left proximal ureter, however the ureter is also dilated distal to the stones, and it is unclear if the stones are course of renal obstruction. There are numerable dependent stones in the urinary bladder, including in the region of the urinary trigone. 2. Left perinephric edema, which may be related to obstructive uropathy, however there is air in the left renal pelvis in the lower pole calyx. This may be ascending related to Foley catheter placement versus infection and emphysematous pyelitis. There is no renal parenchymal air. 3. Small bilateral pleural effusions. Streaky opacities in the left lower lobe consistent with atelectasis. 4. Scattered subpleural ground-glass opacities in both upper lobes are nonspecific, may be infectious or inflammatory. Consider follow-up chest CT in 12 months to ensure resolution. 5. Small pericardial effusion. Aortic Atherosclerosis (ICD10-I70.0) . Electronically Signed   By: Keith Rake M.D.   On: 06/23/2019 17:13   US RENAL  Result Date: 06/20/2019 CLINICAL DATA:  Initial evaluation for acute renal insufficiency. EXAM: RENAL / URINARY TRACT ULTRASOUND COMPLETE COMPARISON:  Prior CT from 02/19/2019 FINDINGS: Right Kidney: Renal measurements: 13.9 x 6.5 x 7.1 cm = volume: 335.8 mL. Echogenicity within normal limits. No nephrolithiasis or hydronephrosis. 3.3 x 3.0 x  3.4 cm simple cyst present at the upper pole. Left Kidney: Renal measurements: 12.7 x 4.0 x 7.5  cm = volume: 294.3 mL. Echogenicity within normal limits. 6 mm echogenic focus within the lower pole the left kidney suspicious for a nonobstructive stone. No hydronephrosis. No focal renal mass. Bladder: Appears normal for degree of bladder distention. Other: None. IMPRESSION: 1. No hydronephrosis or other acute abnormality. 2. Probable 6 mm nonobstructive left renal nephrolithiasis. 3. 3.4 cm simple cyst at the upper pole of the right kidney. Electronically Signed   By: Jeannine Boga M.D.   On: 06/20/2019 01:15   DG Chest Port 1 View  Result Date: 06/23/2019 CLINICAL DATA:  Respiratory failure EXAM: PORTABLE CHEST 1 VIEW COMPARISON:  Chest radiograph from one day prior. FINDINGS: Stable cardiomediastinal silhouette with top-normal heart size. No pneumothorax. No pleural effusion. Stable minimal scarring versus atelectasis at the left costophrenic angle. No pulmonary edema. No acute consolidative airspace disease. IMPRESSION: Stable minimal scarring versus atelectasis at the left costophrenic angle. Otherwise no active disease. Electronically Signed   By: Ilona Sorrel M.D.   On: 06/23/2019 08:57   DG Chest Port 1 View  Result Date: 06/19/2019 CLINICAL DATA:  Weakness and fevers EXAM: PORTABLE CHEST 1 VIEW COMPARISON:  01/03/2019 FINDINGS: Cardiac shadow is within normal limits. Tortuous thoracic aorta is again seen. Patchy bibasilar atelectasis/infiltrate is noted. No sizable effusion is seen. No bony abnormality is noted. IMPRESSION: Patchy bibasilar opacities consistent with atelectasis/early infiltrate. Electronically Signed   By: Inez Catalina M.D.   On: 06/19/2019 21:31   DG C-Arm 1-60 Min-No Report  Result Date: 06/24/2019 Fluoroscopy was utilized by the requesting physician.  No radiographic interpretation.      Subjective: Patient reports doing well.  Eating okay.  Able to ambulate on his own.  Discharge Exam: Vitals:   06/25/19 0800 06/25/19 1000  BP: 116/62 (!) 116/46  Pulse:  61 79  Resp: 19 19  Temp: (!) 97.5 F (36.4 C)   SpO2: 99% 97%   Vitals:   06/25/19 0500 06/25/19 0605 06/25/19 0800 06/25/19 1000  BP:  90/67 116/62 (!) 116/46  Pulse: (!) 51 62 61 79  Resp: 19 15 19 19   Temp:   (!) 97.5 F (36.4 C)   TempSrc:   Oral   SpO2: 97% 99% 99% 97%  Weight:      Height:        General: Pt is alert, awake, not in acute distress Cardiovascular: RRR, S1/S2 +, no rubs, no gallops Respiratory: CTA bilaterally, no wheezing, no rhonchi Abdominal: Soft, NT, ND, bowel sounds + Extremities: no edema, no cyanosis    The results of significant diagnostics from this hospitalization (including imaging, microbiology, ancillary and laboratory) are listed below for reference.

## 2019-06-25 NOTE — Progress Notes (Signed)
1 Day Post-Op Subjective: No acute events overnight.  Feeling much better this morning.  He has been afebrile for the past 24 hours.  Other vital signs are stable.  Urine output more than adequate.  Creatinine has normalized.  Objective: Vital signs in last 24 hours: Temp:  [97.2 F (36.2 C)-98.2 F (36.8 C)] 97.2 F (36.2 C) (05/04 0000) Pulse Rate:  [50-80] 62 (05/04 0605) Resp:  [13-25] 15 (05/04 0605) BP: (90-136)/(38-75) 90/67 (05/04 0605) SpO2:  [91 %-100 %] 99 % (05/04 0605)  Intake/Output from previous day: 05/03 0701 - 05/04 0700 In: 3504.2 [P.O.:120; I.V.:1934.2] Out: [id number] [Urine:1345]  Intake/Output this shift: No intake/output data recorded.  Physical Exam:  General: Alert and oriented CV: RRR, palpable distal pulses Lungs: CTAB, equal chest rise Abdomen: Soft, NTND, no rebound or guarding Gu: Foley catheter in place and draining clear-yellow urine Ext: NT, No erythema  Lab Results: Recent Labs    06/23/19 0742 06/24/19 0309 06/25/19 0151  HGB 10.1* 9.3* 10.2*  HCT 31.2* 28.9* 31.7*   BMET Recent Labs    06/24/19 0309 06/25/19 0151  NA 138 137  K 3.6 4.1  CL 109 108  CO2 22 22  GLUCOSE 151* 270*  BUN 15 17  CREATININE 1.03 0.97  CALCIUM 8.1* 8.5*     Studies/Results: CT ABDOMEN PELVIS WO CONTRAST  Result Date: 06/23/2019 CLINICAL DATA:  Sepsis. EXAM: CT CHEST, ABDOMEN AND PELVIS WITHOUT CONTRAST TECHNIQUE: Multidetector CT imaging of the chest, abdomen and pelvis was performed following the standard protocol without IV contrast. COMPARISON:  None. Chest radiograph earlier today and yesterday. Noncontrast abdominal CT 02/19/2019 FINDINGS: CT CHEST FINDINGS Cardiovascular: Atherosclerosis of the thoracic aorta. No aortic aneurysm. Mild aortic tortuosity. Coronary artery calcifications. Borderline mild cardiomegaly. Small pericardial effusion measuring up to 13 mm adjacent to the left atrium. Mediastinum/Nodes: Lack of contrast and patient motion  limits detailed assessment for adenopathy. Allowing for these limitations, no bulky mediastinal or gross hilar adenopathy. No esophageal wall thickening. No visualized thyroid nodule. Lungs/Pleura: Small bilateral pleural effusions. Streaky opacities in the left lower lobe consistent with atelectasis. There are vague scattered subpleural ground-glass opacities in the left upper lobe, series 507 image 48, 58, and 65. Minimal subpleural ground-glass opacity in the right upper lobe, series 507 image 65 and 78. No confluent airspace disease. The trachea and mainstem bronchi are patent. No evidence of pulmonary mass. 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 Posterior disc osteophyte complex at T11-T12 causes mass effect on the spinal canal. There are no acute or suspicious osseous abnormalities. CT ABDOMEN PELVIS FINDINGS Hepatobiliary: Prominent size liver spanning 20 cm cranial caudal. No focal lesion on noncontrast exam. Gallbladder is partially distended. There is no calcified gallstone or biliary dilatation. No pericholecystic inflammation. Pancreas: No ductal dilatation or inflammation. Spleen: Normal in size without focal abnormality. Adrenals/Urinary Tract: Normal adrenal glands. Mild left hydroureteronephrosis. There are 2 adjacent stones in the left proximal ureter just beyond the ureteropelvic junction measuring 6 x 10 mm and 5 x 5 mm. The ureter distal to the stones is also dilated to the bladder insertion. There are innumerable layering stones in the urinary bladder which extend to the region of the left trigone. Multiple additional nonobstructing stones throughout the left kidney. Mild left perinephric edema. There is air within the left renal pelvis and lower pole calyx. There is no evidence of parenchymal air. Foley catheter decompresses the urinary bladder with bladder air. 3.3 cm cyst in the upper right kidney.  There is no right hydronephrosis. No right renal calculi.  Mild right perinephric edema also seen on prior exam. No right ureteral calculi. Stomach/Bowel: Bowel evaluation is limited in the absence of enteric contrast. Stomach partially distended. No small bowel dilatation to suggest obstruction. No obvious bowel inflammation. The sigmoid colon is redundant coursing into the upper abdomen. Small to moderate volume of stool in the colon. There is minimal liquid stool in the transverse colon without colonic wall thickening or pericolonic edema. The appendix is not well-defined. Vascular/Lymphatic: Aortic atherosclerosis, age advanced. No aortic aneurysm. No bulky abdominopelvic adenopathy. Reproductive: Prostate is unremarkable. Other: Left greater than right retroperitoneal edema tracking into the pelvis. Trace pelvic free fluid. Patchy subcutaneous edema involving the left flank. Supraumbilical ventral abdominal wall hernia just to the left of midline contains fat. There is also a small fat containing umbilical hernia Musculoskeletal: There are no acute or suspicious osseous abnormalities. Multilevel degenerative change in the lumbar spine. IMPRESSION: 1. Moderate left hydroureteronephrosis. There are 2 adjacent stones in the left proximal ureter, however the ureter is also dilated distal to the stones, and it is unclear if the stones are course of renal obstruction. There are numerable dependent stones in the urinary bladder, including in the region of the urinary trigone. 2. Left perinephric edema, which may be related to obstructive uropathy, however there is air in the left renal pelvis in the lower pole calyx. This may be ascending related to Foley catheter placement versus infection and emphysematous pyelitis. There is no renal parenchymal air. 3. Small bilateral pleural effusions. Streaky opacities in the left lower lobe consistent with atelectasis. 4. Scattered subpleural ground-glass opacities in both upper lobes are nonspecific, may be infectious or inflammatory.  Consider follow-up chest CT in 12 months to ensure resolution. 5. Small pericardial effusion. Aortic Atherosclerosis (ICD10-I70.0) . Electronically Signed   By: Keith Rake M.D.   On: 06/23/2019 17:13   CT CHEST WO CONTRAST  Result Date: 06/23/2019 CLINICAL DATA:  Sepsis. EXAM: CT CHEST, ABDOMEN AND PELVIS WITHOUT CONTRAST TECHNIQUE: Multidetector CT imaging of the chest, abdomen and pelvis was performed following the standard protocol without IV contrast. COMPARISON:  None. Chest radiograph earlier today and yesterday. Noncontrast abdominal CT 02/19/2019 FINDINGS: CT CHEST FINDINGS Cardiovascular: Atherosclerosis of the thoracic aorta. No aortic aneurysm. Mild aortic tortuosity. Coronary artery calcifications. Borderline mild cardiomegaly. Small pericardial effusion measuring up to 13 mm adjacent to the left atrium. Mediastinum/Nodes: Lack of contrast and patient motion limits detailed assessment for adenopathy. Allowing for these limitations, no bulky mediastinal or gross hilar adenopathy. No esophageal wall thickening. No visualized thyroid nodule. Lungs/Pleura: Small bilateral pleural effusions. Streaky opacities in the left lower lobe consistent with atelectasis. There are vague scattered subpleural ground-glass opacities in the left upper lobe, series 507 image 48, 58, and 65. Minimal subpleural ground-glass opacity in the right upper lobe, series 507 image 65 and 78. No confluent airspace disease. The trachea and mainstem bronchi are patent. No evidence of pulmonary mass. 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 Posterior disc osteophyte complex at T11-T12 causes mass effect on the spinal canal. There are no acute or suspicious osseous abnormalities. CT ABDOMEN PELVIS FINDINGS Hepatobiliary: Prominent size liver spanning 20 cm cranial caudal. No focal lesion on noncontrast exam. Gallbladder is partially distended. There is no calcified gallstone or biliary  dilatation. No pericholecystic inflammation. Pancreas: No ductal dilatation or inflammation. Spleen: Normal in size without focal abnormality. Adrenals/Urinary Tract: Normal adrenal glands. Mild left hydroureteronephrosis. There are  2 adjacent stones in the left proximal ureter just beyond the ureteropelvic junction measuring 6 x 10 mm and 5 x 5 mm. The ureter distal to the stones is also dilated to the bladder insertion. There are innumerable layering stones in the urinary bladder which extend to the region of the left trigone. Multiple additional nonobstructing stones throughout the left kidney. Mild left perinephric edema. There is air within the left renal pelvis and lower pole calyx. There is no evidence of parenchymal air. Foley catheter decompresses the urinary bladder with bladder air. 3.3 cm cyst in the upper right kidney. There is no right hydronephrosis. No right renal calculi. Mild right perinephric edema also seen on prior exam. No right ureteral calculi. Stomach/Bowel: Bowel evaluation is limited in the absence of enteric contrast. Stomach partially distended. No small bowel dilatation to suggest obstruction. No obvious bowel inflammation. The sigmoid colon is redundant coursing into the upper abdomen. Small to moderate volume of stool in the colon. There is minimal liquid stool in the transverse colon without colonic wall thickening or pericolonic edema. The appendix is not well-defined. Vascular/Lymphatic: Aortic atherosclerosis, age advanced. No aortic aneurysm. No bulky abdominopelvic adenopathy. Reproductive: Prostate is unremarkable. Other: Left greater than right retroperitoneal edema tracking into the pelvis. Trace pelvic free fluid. Patchy subcutaneous edema involving the left flank. Supraumbilical ventral abdominal wall hernia just to the left of midline contains fat. There is also a small fat containing umbilical hernia Musculoskeletal: There are no acute or suspicious osseous abnormalities.  Multilevel degenerative change in the lumbar spine. IMPRESSION: 1. Moderate left hydroureteronephrosis. There are 2 adjacent stones in the left proximal ureter, however the ureter is also dilated distal to the stones, and it is unclear if the stones are course of renal obstruction. There are numerable dependent stones in the urinary bladder, including in the region of the urinary trigone. 2. Left perinephric edema, which may be related to obstructive uropathy, however there is air in the left renal pelvis in the lower pole calyx. This may be ascending related to Foley catheter placement versus infection and emphysematous pyelitis. There is no renal parenchymal air. 3. Small bilateral pleural effusions. Streaky opacities in the left lower lobe consistent with atelectasis. 4. Scattered subpleural ground-glass opacities in both upper lobes are nonspecific, may be infectious or inflammatory. Consider follow-up chest CT in 12 months to ensure resolution. 5. Small pericardial effusion. Aortic Atherosclerosis (ICD10-I70.0) . Electronically Signed   By: Keith Rake M.D.   On: 06/23/2019 17:13   DG C-Arm 1-60 Min-No Report  Result Date: 06/24/2019 Fluoroscopy was utilized by the requesting physician.  No radiographic interpretation.    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  -Recommend a total of 14 days of antibiotic coverage. -The patient is followed by a urologist in [facility], but he cannot recollect who the physician is.  I strongly urged him to contact their office to arrange definitive stone treatment in the coming weeks.  I specifically discussed the temporary nature of his left ureteral stent and he voices understanding. -DC Foley catheter and resume clean intermittent catheterization every 4-6 hours -Okay for discharge from a GU standpoint.   LOS: 6 days    Shane Hughs, MD [facility] Pager: [phone]  06/25/2019, 7:53 AM

## 2019-06-25 NOTE — TOC Progression Note (Signed)
Transition of Care [facility]) - Progression Note    Patient Details  Name: Shane Bond MRN: [id number] Date of Birth: 01-12-1961  [facility] CM/SW Contact  Leeroy Cha, RN Phone Number: 06/25/2019, 9:37 AM  Clinical Narrative:    Text to Carolynn Sayers concerning home iv therapy per uro note.  Accepted and will follow.        Expected Discharge Plan and Services                                                 Social Determinants of Health (SDOH) Interventions    Readmission Risk Interventions No flowsheet data found.

## 2019-06-26 ENCOUNTER — Telehealth: Payer: Self-pay

## 2019-06-26 LAB — CULTURE, BLOOD (ROUTINE X 2)
Culture: NO GROWTH
Culture: NO GROWTH
Special Requests: ADEQUATE
Special Requests: ADEQUATE

## 2019-06-26 MED FILL — !LANTUS SOLOSTAR 100UNITS/M: 100 | 18 days supply | Qty: 9 | Fill #2

## 2019-06-26 MED FILL — $VICTOZA 2-PAK 18MG/3ML PEN: 18 | 30 days supply | Qty: 9 | Fill #2

## 2019-06-26 NOTE — Telephone Encounter (Signed)
Transition Care Management Follow-up Telephone Call Date of discharge and from where: 06/25/2019, [facility]   Call placed to patient # [phone], message left with call back requested to this CM.   Patient needs to schedule follow up appointment with PCP

## 2019-06-27 ENCOUNTER — Telehealth: Payer: Self-pay

## 2019-06-27 LAB — CULTURE, BLOOD (ROUTINE X 2)
Culture: NO GROWTH
Culture: NO GROWTH
Special Requests: ADEQUATE
Special Requests: ADEQUATE

## 2019-06-27 NOTE — Telephone Encounter (Signed)
Transition Care Management Follow-up Telephone Call  Attempt # 2   Date of discharge and from where: 06/25/2019, [facility]   Calls placed to patient # [phone], [phone] and [phone] messages left at each number with call back requested to this CM.   Patient needs to schedule follow up appointment with PCP

## 2019-06-28 LAB — CULTURE, BLOOD (ROUTINE X 2)
Culture: NO GROWTH
Culture: NO GROWTH
Special Requests: ADEQUATE
Special Requests: ADEQUATE

## 2019-07-03 ENCOUNTER — Other Ambulatory Visit: Payer: Self-pay

## 2019-07-03 ENCOUNTER — Encounter: Payer: Self-pay | Admitting: Nurse Practitioner

## 2019-07-03 ENCOUNTER — Ambulatory Visit: Payer: Self-pay | Attending: Nurse Practitioner | Admitting: Nurse Practitioner

## 2019-07-03 DIAGNOSIS — E1165 Type 2 diabetes mellitus with hyperglycemia: Secondary | ICD-10-CM

## 2019-07-03 DIAGNOSIS — N179 Acute kidney failure, unspecified: Secondary | ICD-10-CM

## 2019-07-03 DIAGNOSIS — Z09 Encounter for follow-up examination after completed treatment for conditions other than malignant neoplasm: Secondary | ICD-10-CM

## 2019-07-03 DIAGNOSIS — E782 Mixed hyperlipidemia: Secondary | ICD-10-CM

## 2019-07-03 DIAGNOSIS — Z794 Long term (current) use of insulin: Secondary | ICD-10-CM

## 2019-07-03 NOTE — Progress Notes (Signed)
Virtual Visit via Telephone Note Due to national recommendations of social distancing due to [place] 19, telehealth visit is felt to be most appropriate for this patient at this time.  I discussed the limitations, risks, security and privacy concerns of performing an evaluation and management service by telephone and the availability of in person appointments. I also discussed with the patient that there may be a patient responsible charge related to this service. The patient expressed understanding and agreed to proceed.    I connected with Shane Bond on 07/03/19  at  10:50 AM EDT  EDT by telephone and verified that I am speaking with the correct person using two identifiers.   Consent I discussed the limitations, risks, security and privacy concerns of performing an evaluation and management service by telephone and the availability of in person appointments. I also discussed with the patient that there may be a patient responsible charge related to this service. The patient expressed understanding and agreed to proceed.   Location of Patient: Private Residence    Location of Provider: [place] and [place] participating in Telemedicine visit: [name] FNP-BC [place]     History of Present Illness: Telemedicine visit for: HFU   Admitted to the hospital on 4-28 for 7 days with urosepsis complicated by AKI, hyperkalemia and hypoxia. He has a history of HTN, DM, hypertriglyceridemia, neurogenic bladder post Back Sx/BPH with intermittent self catheterization  CT of abdomen revealed obstructing proximal left ureter calculi with hydronephrosis. He is not s/p cystoscopy with left JJ stent placement. Discharged home with po augmentin and instructed to follow up with urology for JJ stent that was placed.   Today he states he is doing well. Has appt with Urology through [facility] in a few weeks 6-4. Denies fever or hematuria.       Past Medical History:  Diagnosis Date  . 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 Anemia   . Bladder atonia   . BPH (benign prostatic hyperplasia)   . CAD, NATIVE VESSEL 10/29/2009  . Diabetes mellitus type 2, uncontrolled, with complications ([place])   . Hyperlipidemia LDL goal <70   . Kidney stone   . OBESITY 10/22/2009    Past Surgical History:  Procedure Laterality Date  . BACK SURGERY    . CORONARY STENT PLACEMENT    . CYSTOSCOPY W/ URETERAL STENT PLACEMENT  02/26/2012   Procedure: CYSTOSCOPY WITH RETROGRADE PYELOGRAM/URETERAL STENT PLACEMENT;  Surgeon: Bernestine Amass, MD;  Location: [facility];  Service: Urology;  Laterality: Left;  . CYSTOSCOPY WITH STENT PLACEMENT Left 06/24/2019   Procedure: CYSTOSCOPY WITH STENT PLACEMENT LEFT URETER WITH LEFT RETROGRADE URETERAL;  Surgeon: Ceasar Mons, MD;  Location: [facility];  Service: Urology;  Laterality: Left;    Family History  Problem Relation Age of Onset  . Diabetes Father   . Coronary artery disease Other   . Heart attack Other   . Diabetes Paternal Aunt     Social History   Socioeconomic History  . Marital status: Single    Spouse name: Not on file  . Number of children: Not on file  . Years of education: Not on file  . Highest education level: Not on file  Occupational History  . Not on file  Tobacco Use  . Smoking status: Current Every Day Smoker    Types: E-cigarettes  . Smokeless tobacco: Former Network engineer and Sexual Activity  . Alcohol use: No  .  Drug use: No  . Sexual activity: Not Currently  Other Topics Concern  . Not on file  Social History Narrative  . Not on file   Social Determinants of Health   Financial Resource Strain:   . Difficulty of Paying Living Expenses:   Food Insecurity:   . Worried About Charity fundraiser in the Last Year:   . Arboriculturist in the Last Year:   Transportation Needs:   . Film/video editor (Medical):   [name] Lack of Transportation (Non-Medical):   Physical  Activity:   . Days of Exercise per Week:   . Minutes of Exercise per Session:   Stress:   . Feeling of Stress :   Social Connections:   . Frequency of Communication with Friends and Family:   . Frequency of Social Gatherings with Friends and Family:   . Attends Religious Services:   . Active Member of Clubs or Organizations:   . Attends Archivist Meetings:   [name] Marital Status:      Observations/Objective: Awake, alert and oriented x 3   Review of Systems  Constitutional: Negative for fever, malaise/fatigue and weight loss.  HENT: Negative.  Negative for nosebleeds.   Eyes: Negative.  Negative for blurred vision, double vision and photophobia.  Respiratory: Negative.  Negative for cough and shortness of breath.   Cardiovascular: Negative.  Negative for chest pain, palpitations and leg swelling.  Gastrointestinal: Negative.  Negative for heartburn, nausea and vomiting.  Musculoskeletal: Negative.  Negative for myalgias.  Neurological: Negative.  Negative for dizziness, focal weakness, seizures and headaches.  Psychiatric/Behavioral: Negative.  Negative for suicidal ideas.    Assessment and Plan: Fed was seen today for hospitalization follow-up.  Diagnoses and all orders for this visit:  Hospital discharge follow-up  Acute kidney injury ([place]) -     CBC; Future -     CMP14+EGFR; Future    Follow Up Instructions Return in about 6 weeks (around 08/14/2019).     I discussed the assessment and treatment plan with the patient. The patient was provided an opportunity to ask questions and all were answered. The patient agreed with the plan and demonstrated an understanding of the instructions.   The patient was advised to call back or seek an in-person evaluation if the symptoms worsen or if the condition fails to improve as anticipated.  I provided 17 minutes of non-face-to-face time during this encounter including median intraservice time, reviewing previous  notes, labs, imaging, medications and explaining diagnosis and management.  Gildardo Pounds, FNP-BC

## 2019-07-04 ENCOUNTER — Encounter: Payer: Self-pay | Admitting: Nurse Practitioner

## 2019-07-19 MED FILL — $LANTUS SOLOSTAR 100 UNITS/: 100 | 18 days supply | Qty: 9 | Fill #3

## 2019-07-20 ENCOUNTER — Other Ambulatory Visit: Payer: Self-pay | Admitting: Nurse Practitioner

## 2019-07-20 ENCOUNTER — Encounter: Payer: Self-pay | Admitting: Nurse Practitioner

## 2019-07-20 MED ORDER — OMEGA-3-ACID ETHYL ESTERS 1 G PO CAPS: 1.0000 g | ORAL_CAPSULE | Freq: Two times a day (BID) | ORAL | 1 refills | Status: DC

## 2019-07-20 MED ORDER — GEMFIBROZIL 600 MG PO TABS: 600.0000 mg | ORAL_TABLET | Freq: Two times a day (BID) | ORAL | 1 refills | Status: DC

## 2019-07-23 MED FILL — OMEGA-3 ETHYL ESTERS 1 GM C: 1 | 30 days supply | Qty: 60 | Fill #0

## 2019-07-26 ENCOUNTER — Other Ambulatory Visit: Payer: Self-pay | Admitting: Urology

## 2019-07-30 NOTE — Patient Instructions (Addendum)
DUE TO COVID-19 ONLY ONE VISITOR IS ALLOWED TO COME WITH YOU AND STAY IN THE WAITING ROOM ONLY DURING PRE OP AND PROCEDURE DAY OF SURGERY. THE 1 VISITOR MAY VISIT WITH YOU AFTER SURGERY IN YOUR PRIVATE ROOM DURING VISITING HOURS ONLY!  YOU NEED TO HAVE A COVID 19 TEST ON: 08/03/19@ 12:20 am , THIS TEST MUST BE DONE BEFORE SURGERY, COME  [place], [place] , [address].  ([place]) ONCE YOUR COVID TEST IS COMPLETED, PLEASE BEGIN THE QUARANTINE INSTRUCTIONS AS OUTLINED IN YOUR HANDOUT.                [name]    Your procedure is scheduled on: 08/07/19   Report to [facility] Main  Entrance   Report to admitting at: 7:45 AM     Call this number if you have problems the morning of surgery [phone]    Remember: Do not eat solid food  :After Midnight. Clear liquids from midnight until 6:45 am    CLEAR LIQUID DIET   Foods Allowed                                                                     Foods Excluded  Coffee and tea, regular and decaf                             liquids that you cannot  Plain Jell-O any favor except red or purple                                           see through such as: Fruit ices (not with fruit pulp)                                     milk, soups, orange juice  Iced Popsicles                                    All solid food Carbonated beverages, regular and diet                                    Cranberry, grape and apple juices Sports drinks like Gatorade Lightly seasoned clear broth or consume(fat free) Sugar, honey syrup  Sample Menu Breakfast                                Lunch                                     Supper Cranberry juice                    Beef broth  Chicken broth Jell-O                                     Grape juice                           Apple juice Coffee or tea                        Jell-O                                      Popsicle                                                 Coffee or tea                        Coffee or tea  _____________________________________________________________________  BRUSH YOUR TEETH MORNING OF SURGERY AND RINSE YOUR MOUTH OUT, NO CHEWING GUM CANDY OR MINTS.     Take these medicines the morning of surgery with A SIP OF WATER:   How to Manage Your Diabetes Before and After Surgery  Why is it important to control my blood sugar before and after surgery? . Improving blood sugar levels before and after surgery helps healing and can limit problems. . A way of improving blood sugar control is eating a healthy diet by: o  Eating less sugar and carbohydrates o  Increasing activity/exercise o  Talking with your doctor about reaching your blood sugar goals . High blood sugars (greater than 180 mg/dL) can raise your risk of infections and slow your recovery, so you will need to focus on controlling your diabetes during the weeks before surgery. . Make sure that the doctor who takes care of your diabetes knows about your planned surgery including the date and location.  How do I manage my blood sugar before surgery? . Check your blood sugar at least 4 times a day, starting 2 days before surgery, to make sure that the level is not too high or low. o Check your blood sugar the morning of your surgery when you wake up and every 2 hours until you get to the Short Stay unit. . If your blood sugar is less than 70 mg/dL, you will need to treat for low blood sugar: o Do not take insulin. o Treat a low blood sugar (less than 70 mg/dL) with  cup of clear juice (cranberry or apple), 4 glucose tablets, OR glucose gel. o Recheck blood sugar in 15 minutes after treatment (to make sure it is greater than 70 mg/dL). If your blood sugar is not greater than 70 mg/dL on recheck, call [phone] for further instructions. . Report your blood sugar to the short stay nurse when you get to Short Stay.  . If you are admitted  to the hospital after surgery: o Your blood sugar will be checked by the staff and you will probably be given insulin after surgery (instead of oral diabetes medicines) to make sure you have good blood sugar levels. o The goal for blood sugar control after surgery is 80-180 mg/dL.  WHAT DO I DO ABOUT MY DIABETES MEDICATION?  [name] Do not take oral diabetes medicines (pills) the morning of surgery.  . THE DAY BEFORE SURGERY, take Metformin and Victosa as usual. Take half of the Lantus insulin dose.       . The day of surgery, do not take other diabetes injectables, including Byetta (exenatide), Bydureon (exenatide ER), Victoza (liraglutide), or Trulicity (dulaglutide).  . If your CBG is greater than 220 mg/dL, you may take  of your sliding scale  . (correction) dose of insulin  DO NOT TAKE ANY DIABETIC MEDICATIONS DAY OF YOUR SURGERY                               You may not have any metal on your body including hair pins and              piercings  Do not wear jewelry, make-up, lotions, powders or perfumes, deodorant             Do not wear nail polish on your fingernails.  Do not shave  48 hours prior to surgery.              Men may shave face and neck.   Do not bring valuables to the hospital. [place].  Contacts, dentures or bridgework may not be worn into surgery.  Leave suitcase in the car. After surgery it may be brought to your room.     Patients discharged the day of surgery will not be allowed to drive home. IF YOU ARE HAVING SURGERY AND GOING HOME THE SAME DAY, YOU MUST HAVE AN ADULT TO DRIVE YOU HOME AND BE WITH YOU FOR 24 HOURS. YOU MAY GO HOME BY TAXI OR UBER OR ORTHERWISE, BUT AN ADULT MUST ACCOMPANY YOU HOME AND STAY WITH YOU FOR 24 HOURS.  Name and phone number of your driver:  Special Instructions: N/A              Please read over the following fact sheets you were  given: _____________________________________________________________________  [facility] - Preparing for Surgery Before surgery, you can play an important role.  Because skin is not sterile, your skin needs to be as free of germs as possible.  You can reduce the number of germs on your skin by washing with CHG (chlorahexidine gluconate) soap before surgery.  CHG is an antiseptic cleaner which kills germs and bonds with the skin to continue killing germs even after washing. Please DO NOT use if you have an allergy to CHG or antibacterial soaps.  If your skin becomes reddened/irritated stop using the CHG and inform your nurse when you arrive at Short Stay. Do not shave (including legs and underarms) for at least 48 hours prior to the first CHG shower.  You may shave your face/neck. Please follow these instructions carefully:  1.  Shower with CHG Soap the night before surgery and the  morning of Surgery.  2.  If you choose to wash your hair, wash your hair first as usual with your  normal  shampoo.  3.  After you shampoo, rinse your hair and body thoroughly to remove the  shampoo.                           4.  Use CHG as you would any other liquid soap.  You can apply chg directly  to the skin and wash                       Gently with a scrungie or clean washcloth.  5.  Apply the CHG Soap to your body ONLY FROM THE NECK DOWN.   Do not use on face/ open                           Wound or open sores. Avoid contact with eyes, ears mouth and genitals (private parts).                       Wash face,  Genitals (private parts) with your normal soap.             6.  Wash thoroughly, paying special attention to the area where your surgery  will be performed.  7.  Thoroughly rinse your body with warm water from the neck down.  8.  DO NOT shower/wash with your normal soap after using and rinsing off  the CHG Soap.                9.  Pat yourself dry with a clean towel.            10.  Wear clean pajamas.             11.  Place clean sheets on your bed the night of your first shower and do not  sleep with pets. Day of Surgery : Do not apply any lotions/deodorants the morning of surgery.  Please wear clean clothes to the hospital/surgery center.  FAILURE TO FOLLOW THESE INSTRUCTIONS MAY RESULT IN THE CANCELLATION OF YOUR SURGERY PATIENT SIGNATURE_________________________________  NURSE SIGNATURE__________________________________  ________________________________________________________________________

## 2019-07-31 ENCOUNTER — Other Ambulatory Visit: Payer: Self-pay

## 2019-07-31 ENCOUNTER — Encounter (HOSPITAL_COMMUNITY)
Admission: RE | Admit: 2019-07-31 | Discharge: 2019-07-31 | Disposition: A | Discharge status: Home / Self Care | Payer: Self-pay | Source: Ambulatory Visit | Attending: Urology | Admitting: Urology

## 2019-07-31 ENCOUNTER — Encounter (HOSPITAL_COMMUNITY): Payer: Self-pay

## 2019-07-31 DIAGNOSIS — Z01812 Encounter for preprocedural laboratory examination: Secondary | ICD-10-CM | POA: Insufficient documentation

## 2019-07-31 HISTORY — DX: Essential (primary) hypertension: I10

## 2019-07-31 NOTE — Progress Notes (Signed)
COVID Vaccine Completed:No Date COVID Vaccine completed: COVID vaccine manufacturer: [employer]   PCP - [name] NP. LOV: 07/03/19 Cardiologist -   Chest x-ray -  EKG -  Stress Test -  ECHO -  Cardiac Cath -   Sleep Study -  CPAP -   Fasting Blood Sugar - 200's Checks Blood Sugar ___2__ times a day  Blood Thinner Instructions:Will stop a week before surgery  Aspirin Instructions:Dr. Louis Meckel B Last Dose:  Anesthesia review: Hx. Heart attack,CAD,DIA 2  Patient denies shortness of breath, fever, cough and chest pain at PAT appointment   Patient verbalized understanding of instructions that were given to them at the PAT appointment. Patient was also instructed that they will need to review over the PAT instructions again at home before surgery.

## 2019-08-01 ENCOUNTER — Other Ambulatory Visit: Payer: Self-pay | Admitting: Nurse Practitioner

## 2019-08-01 ENCOUNTER — Encounter (HOSPITAL_COMMUNITY)
Admission: RE | Admit: 2019-08-01 | Discharge: 2019-08-01 | Disposition: A | Discharge status: Home / Self Care | Payer: Self-pay | Source: Ambulatory Visit | Attending: Urology | Admitting: Urology

## 2019-08-01 DIAGNOSIS — Z794 Long term (current) use of insulin: Secondary | ICD-10-CM

## 2019-08-01 DIAGNOSIS — Z01812 Encounter for preprocedural laboratory examination: Secondary | ICD-10-CM | POA: Insufficient documentation

## 2019-08-01 LAB — CBC
HCT: 36.6 % — ABNORMAL LOW (ref 39.0–52.0)
Hemoglobin: 12 g/dL — ABNORMAL LOW (ref 13.0–17.0)
MCH: 32.4 pg (ref 26.0–34.0)
MCHC: 32.8 g/dL (ref 30.0–36.0)
MCV: 98.9 fL (ref 80.0–100.0)
Platelets: 321 10*3/uL (ref 150–400)
RBC: 3.7 MIL/uL — ABNORMAL LOW (ref 4.22–5.81)
RDW: 13.3 % (ref 11.5–15.5)
WBC: 5.6 10*3/uL (ref 4.0–10.5)
nRBC: 0 % (ref 0.0–0.2)

## 2019-08-01 LAB — BASIC METABOLIC PANEL
Anion gap: 10 (ref 5–15)
BUN: 18 mg/dL (ref 6–20)
CO2: 22 mmol/L (ref 22–32)
Calcium: 9.2 mg/dL (ref 8.9–10.3)
Chloride: 103 mmol/L (ref 98–111)
Creatinine, Ser: 0.87 mg/dL (ref 0.61–1.24)
GFR calc Af Amer: >60 mL/min (ref >60)
GFR calc non Af Amer: >60 mL/min (ref >60)
Glucose, Bld: 137 mg/dL — ABNORMAL HIGH (ref 70–99)
Potassium: 4.5 mmol/L (ref 3.5–5.1)
Sodium: 135 mmol/L (ref 135–145)

## 2019-08-01 LAB — HEMOGLOBIN A1C
Hgb A1c MFr Bld: 8.3 % — ABNORMAL HIGH (ref 4.8–5.6)
Mean Plasma Glucose: 191.51 mg/dL

## 2019-08-01 NOTE — Progress Notes (Signed)
HGBA1C done 08/01/19 routed via epic to Dr Louis Meckel.

## 2019-08-03 ENCOUNTER — Other Ambulatory Visit (HOSPITAL_COMMUNITY)
Admission: RE | Admit: 2019-08-03 | Discharge: 2019-08-03 | Disposition: A | Discharge status: Home / Self Care | Payer: HRSA Program | Source: Ambulatory Visit | Attending: Urology | Admitting: Urology

## 2019-08-03 DIAGNOSIS — Z20822 Contact with and (suspected) exposure to covid-19: Secondary | ICD-10-CM | POA: Insufficient documentation

## 2019-08-03 DIAGNOSIS — Z01812 Encounter for preprocedural laboratory examination: Secondary | ICD-10-CM | POA: Diagnosis present

## 2019-08-03 LAB — SARS CORONAVIRUS 2 (TAT 6-24 HRS): SARS Coronavirus 2: NEGATIVE

## 2019-08-03 LAB — URINE CULTURE: Culture: >=100000 — AB

## 2019-08-05 ENCOUNTER — Other Ambulatory Visit: Payer: Self-pay | Admitting: Family Medicine

## 2019-08-05 ENCOUNTER — Other Ambulatory Visit: Payer: Self-pay | Admitting: Urology

## 2019-08-05 DIAGNOSIS — Z794 Long term (current) use of insulin: Secondary | ICD-10-CM

## 2019-08-05 MED ORDER — CIPROFLOXACIN HCL 500 MG PO TABS
500.0000 mg | ORAL_TABLET | Freq: Two times a day (BID) | ORAL | 0 refills | Status: AC
Start: 2019-08-05 — End: 2019-08-12

## 2019-08-05 MED FILL — $VICTOZA 2-PAK 18MG/3ML PEN: 18 | 30 days supply | Qty: 9 | Fill #0

## 2019-08-05 MED FILL — CIPROFLOXACIN HCL 500 MG TA: 500 | 7 days supply | Qty: 14 | Fill #0

## 2019-08-05 MED FILL — $LANTUS SOLOSTAR 100 UNITS/: 100 | 6 days supply | Qty: 3 | Fill #4

## 2019-08-06 NOTE — Anesthesia Preprocedure Evaluation (Addendum)
Anesthesia Evaluation  Patient identified by MRN, date of birth, ID band Patient awake    Reviewed: Allergy & Precautions, NPO status , Patient's Chart, lab work & pertinent test results  Airway Mallampati: II  TM Distance: >3 FB Neck ROM: Full    Dental no notable dental hx. (+) Poor Dentition   Pulmonary Current SmokerPatient did not abstain from smoking.,    Pulmonary exam normal breath sounds clear to auscultation       Cardiovascular hypertension, Pt. on medications + CAD and + Past MI  Normal cardiovascular exam Rhythm:Regular Rate:Normal     Neuro/Psych  Neuromuscular disease negative psych ROS   GI/Hepatic negative GI ROS, Neg liver ROS,   Endo/Other  diabetes, Well Controlled, Type 2, Oral Hypoglycemic Agents, Insulin Dependent  Renal/GU      Musculoskeletal negative musculoskeletal ROS (+)   Abdominal   Peds  Hematology Lab Results      Component                Value               Date                      WBC                      5.6                 08/01/2019                HGB                      12.0 (L)            08/01/2019                HCT                      36.6 (L)            08/01/2019                MCV                      98.9                08/01/2019                PLT                      321                 08/01/2019          .l   Anesthesia Other Findings   Reproductive/Obstetrics negative OB ROS                            Anesthesia Physical Anesthesia Plan  ASA: III  Anesthesia Plan: General   Post-op Pain Management:    Induction: Intravenous  PONV Risk Score and Plan: 3 and Treatment may vary due to age or medical condition, Ondansetron, Dexamethasone and Midazolam  Airway Management Planned: LMA  Additional Equipment: None  Intra-op Plan:   Post-operative Plan:   Informed Consent: I have reviewed the patients History and Physical,  chart, labs and discussed the procedure including the risks, benefits and alternatives for the proposed anesthesia with  the patient or authorized representative who has indicated his/her understanding and acceptance.     Dental advisory given  Plan Discussed with: CRNA  Anesthesia Plan Comments:        Anesthesia Quick Evaluation

## 2019-08-07 ENCOUNTER — Encounter (HOSPITAL_COMMUNITY): Payer: Self-pay | Admitting: Urology

## 2019-08-07 ENCOUNTER — Ambulatory Visit (HOSPITAL_COMMUNITY)
Admission: RE | Admit: 2019-08-07 | Discharge: 2019-08-07 | Disposition: A | Discharge status: Home / Self Care | Payer: Self-pay | Attending: Urology | Admitting: Urology

## 2019-08-07 ENCOUNTER — Encounter (HOSPITAL_COMMUNITY)
Admission: RE | Disposition: A | Discharge status: Home / Self Care | Payer: Self-pay | Source: Home / Self Care | Attending: Urology

## 2019-08-07 ENCOUNTER — Ambulatory Visit (HOSPITAL_COMMUNITY): Payer: Self-pay | Admitting: Physician Assistant

## 2019-08-07 ENCOUNTER — Ambulatory Visit (HOSPITAL_COMMUNITY): Payer: Self-pay

## 2019-08-07 ENCOUNTER — Ambulatory Visit (HOSPITAL_COMMUNITY): Payer: Self-pay | Admitting: Anesthesiology

## 2019-08-07 DIAGNOSIS — Z79899 Other long term (current) drug therapy: Secondary | ICD-10-CM | POA: Insufficient documentation

## 2019-08-07 DIAGNOSIS — Z794 Long term (current) use of insulin: Secondary | ICD-10-CM | POA: Insufficient documentation

## 2019-08-07 DIAGNOSIS — E7800 Pure hypercholesterolemia, unspecified: Secondary | ICD-10-CM | POA: Insufficient documentation

## 2019-08-07 DIAGNOSIS — N210 Calculus in bladder: Secondary | ICD-10-CM | POA: Insufficient documentation

## 2019-08-07 DIAGNOSIS — E119 Type 2 diabetes mellitus without complications: Secondary | ICD-10-CM | POA: Insufficient documentation

## 2019-08-07 DIAGNOSIS — R972 Elevated prostate specific antigen [PSA]: Secondary | ICD-10-CM | POA: Insufficient documentation

## 2019-08-07 DIAGNOSIS — Z7982 Long term (current) use of aspirin: Secondary | ICD-10-CM | POA: Insufficient documentation

## 2019-08-07 DIAGNOSIS — N202 Calculus of kidney with calculus of ureter: Secondary | ICD-10-CM | POA: Insufficient documentation

## 2019-08-07 DIAGNOSIS — Z87891 Personal history of nicotine dependence: Secondary | ICD-10-CM | POA: Insufficient documentation

## 2019-08-07 DIAGNOSIS — I10 Essential (primary) hypertension: Secondary | ICD-10-CM | POA: Insufficient documentation

## 2019-08-07 HISTORY — PX: CYSTOSCOPY WITH RETROGRADE PYELOGRAM, URETEROSCOPY AND STENT PLACEMENT: SHX5789

## 2019-08-07 LAB — GLUCOSE, CAPILLARY
Glucose-Capillary: 212 mg/dL — ABNORMAL HIGH (ref 70–99)
Glucose-Capillary: 179 mg/dL — ABNORMAL HIGH (ref 70–99)

## 2019-08-07 SURGERY — CYSTOURETEROSCOPY, WITH RETROGRADE PYELOGRAM AND STENT INSERTION
Anesthesia: General | Laterality: Left

## 2019-08-07 MED ORDER — ORAL CARE MOUTH RINSE: 15.0000 mL | Freq: Once | OROMUCOSAL | Status: AC

## 2019-08-07 MED ORDER — LIDOCAINE 2% (20 MG/ML) 5 ML SYRINGE
INTRAMUSCULAR | Status: DC | PRN
  Administered 2019-08-07: 100 mg via INTRAVENOUS

## 2019-08-07 MED ORDER — CHLORHEXIDINE GLUCONATE 0.12 % MT SOLN
15.0000 mL | Freq: Once | OROMUCOSAL | Status: AC
  Administered 2019-08-07: 15 mL via OROMUCOSAL

## 2019-08-07 MED ORDER — HYDROMORPHONE HCL 1 MG/ML IJ SOLN: 0.2500 mg | INTRAMUSCULAR | Status: DC | PRN

## 2019-08-07 MED ORDER — MIDAZOLAM HCL 5 MG/5ML IJ SOLN
INTRAMUSCULAR | Status: DC | PRN
  Administered 2019-08-07: 2 mg via INTRAVENOUS

## 2019-08-07 MED ORDER — MIDAZOLAM HCL 2 MG/2ML IJ SOLN
INTRAMUSCULAR | Status: AC
  Filled 2019-08-07: qty 2

## 2019-08-07 MED ORDER — PROPOFOL 10 MG/ML IV BOLUS
INTRAVENOUS | Status: DC | PRN
  Administered 2019-08-07: 160 mg via INTRAVENOUS

## 2019-08-07 MED ORDER — ONDANSETRON HCL 4 MG/2ML IJ SOLN: 4.0000 mg | Freq: Once | INTRAMUSCULAR | Status: DC | PRN

## 2019-08-07 MED ORDER — SODIUM CHLORIDE 0.9 % IV SOLN
INTRAVENOUS | Status: DC | PRN
  Administered 2019-08-07: 13 mL

## 2019-08-07 MED ORDER — LIDOCAINE 2% (20 MG/ML) 5 ML SYRINGE
INTRAMUSCULAR | Status: AC
  Filled 2019-08-07: qty 5

## 2019-08-07 MED ORDER — LIDOCAINE HCL URETHRAL/MUCOSAL 2 % EX GEL
CUTANEOUS | Status: AC
  Filled 2019-08-07: qty 30

## 2019-08-07 MED ORDER — SODIUM CHLORIDE 0.9 % IR SOLN
Status: DC | PRN
  Administered 2019-08-07: 1000 mL
  Administered 2019-08-07 (×2): 3000 mL

## 2019-08-07 MED ORDER — DEXAMETHASONE SODIUM PHOSPHATE 10 MG/ML IJ SOLN
INTRAMUSCULAR | Status: AC
  Filled 2019-08-07: qty 1

## 2019-08-07 MED ORDER — ONDANSETRON HCL 4 MG/2ML IJ SOLN
INTRAMUSCULAR | Status: DC | PRN
  Administered 2019-08-07: 4 mg via INTRAVENOUS

## 2019-08-07 MED ORDER — GENTAMICIN SULFATE 40 MG/ML IJ SOLN
5.0000 mg/kg | INTRAVENOUS | Status: AC
  Administered 2019-08-07: 460 mg via INTRAVENOUS
  Filled 2019-08-07: qty 11.5

## 2019-08-07 MED ORDER — CEFAZOLIN SODIUM-DEXTROSE 2-4 GM/100ML-% IV SOLN
2.0000 g | INTRAVENOUS | Status: AC
  Administered 2019-08-07: 2 g via INTRAVENOUS
  Filled 2019-08-07: qty 100

## 2019-08-07 MED ORDER — HYDROCODONE-ACETAMINOPHEN 7.5-325 MG PO TABS: 1.0000 | ORAL_TABLET | Freq: Once | ORAL | Status: DC | PRN

## 2019-08-07 MED ORDER — ACETAMINOPHEN 10 MG/ML IV SOLN: 1000.0000 mg | Freq: Once | INTRAVENOUS | Status: DC | PRN

## 2019-08-07 MED ORDER — PROPOFOL 10 MG/ML IV BOLUS
INTRAVENOUS | Status: AC
  Filled 2019-08-07: qty 20

## 2019-08-07 MED ORDER — LACTATED RINGERS IV SOLN: INTRAVENOUS | Status: DC

## 2019-08-07 MED ORDER — FENTANYL CITRATE (PF) 100 MCG/2ML IJ SOLN
INTRAMUSCULAR | Status: AC
  Filled 2019-08-07: qty 2

## 2019-08-07 MED ORDER — TRAMADOL HCL 50 MG PO TABS: 50.0000 mg | ORAL_TABLET | Freq: Four times a day (QID) | ORAL | 0 refills | Status: DC | PRN

## 2019-08-07 MED ORDER — FENTANYL CITRATE (PF) 100 MCG/2ML IJ SOLN
INTRAMUSCULAR | Status: DC | PRN
  Administered 2019-08-07 (×2): 25 ug via INTRAVENOUS

## 2019-08-07 MED ORDER — ONDANSETRON HCL 4 MG/2ML IJ SOLN
INTRAMUSCULAR | Status: AC
  Filled 2019-08-07: qty 2

## 2019-08-07 MED ORDER — DEXAMETHASONE SODIUM PHOSPHATE 10 MG/ML IJ SOLN
INTRAMUSCULAR | Status: DC | PRN
  Administered 2019-08-07: 10 mg via INTRAVENOUS

## 2019-08-07 MED FILL — traMADol HCL 50 MG TABS: 50 | 2 days supply | Qty: 15 | Fill #0

## 2019-08-07 SURGICAL SUPPLY — 24 items
APL SKNCLS STERI-STRIP NONHPOA (GAUZE/BANDAGES/DRESSINGS) ×1
BAG URO CATCHER STRL LF (MISCELLANEOUS) ×3 IMPLANT
BASKET ZERO TIP NITINOL 2.4FR (BASKET) ×2 IMPLANT
BENZOIN TINCTURE PRP APPL 2/3 (GAUZE/BANDAGES/DRESSINGS) ×2 IMPLANT
BSKT STON RTRVL ZERO TP 2.4FR (BASKET) ×1
CATH URET 5FR 28IN OPEN ENDED (CATHETERS) ×3 IMPLANT
CLOTH BEACON ORANGE TIMEOUT ST (SAFETY) ×3 IMPLANT
DRSG TEGADERM 2-3/8X2-3/4 SM (GAUZE/BANDAGES/DRESSINGS) ×2 IMPLANT
EXTRACTOR STONE 1.7FRX115CM (UROLOGICAL SUPPLIES) IMPLANT
FIBER LASER TRAC TIP (UROLOGICAL SUPPLIES) ×2 IMPLANT
GLOVE BIOGEL M STRL SZ7.5 (GLOVE) ×3 IMPLANT
GOWN STRL REUS W/TWL XL LVL3 (GOWN DISPOSABLE) ×3 IMPLANT
GUIDEWIRE ANG ZIPWIRE 038X150 (WIRE) IMPLANT
GUIDEWIRE STR DUAL SENSOR (WIRE) ×5 IMPLANT
KIT TURNOVER KIT A (KITS) IMPLANT
MANIFOLD NEPTUNE II (INSTRUMENTS) ×3 IMPLANT
SHEATH URETERAL 12FRX28CM (UROLOGICAL SUPPLIES) ×2 IMPLANT
SHEATH URETERAL 12FRX35CM (MISCELLANEOUS) ×2 IMPLANT
STENT URET 6FRX26 CONTOUR (STENTS) ×2 IMPLANT
SYR 20ML LL LF (SYRINGE) ×2 IMPLANT
TRAY CYSTO PACK (CUSTOM PROCEDURE TRAY) ×3 IMPLANT
TUBING CONNECTING 10 (TUBING) ×2 IMPLANT
TUBING CONNECTING 10' (TUBING) ×1
TUBING UROLOGY SET (TUBING) ×3 IMPLANT

## 2019-08-07 NOTE — H&P (Signed)
CC/HPI:  Male with history of CAD, DM 2, hyperlipidemia, obesity, urinary retention, and nephrolithiasis   03/28/19: Presented to the emergency room on 02/19/2019 with left flank pain. CT showed 7 mm left UVJ stone with left hydronephrosis. Urine culture grew >100,000 enterobacter and Staph Epi. Enterobacter was resistant to Ancef and nitrofurantoin. Staph was resistant to ciprofloxacin. He is unsure if he took any antibiotics. Several days after the ER he reports passing 2 stones with immediate resolution of his flank pain. He has had complete urinary retention since back surgery 1999. 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 He has been performing in and out catheterization with 14 French straight catheters approximately 6 times per day since that time without issue. He has obtains the catheters from a local medical supply store and uses them for 1 to 2 weeks prior to obtaining a new catheter.   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 Lovena Neighbours). Creatinine at discharge (5/4) was 0.9. Of note, seen by Dr. 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   CT from 06/23/19 demonstrates  Left:  -Upper pole: 2 punctate stones  -Intrapolar: 1 punctate stone  -Lower pole: 1 71mm stone  -Proximal ureter: 45mm stone and 50mm stone  Right: no calculi  Bladder: ~10 small-moderate sized stones   Serum calcium: 8.5 (wnl)  Urine pH: 5.0-6.0   07/18/19: Returns for follow up. 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 Patient is seen in follow-up after his left ureteral stent placement. He reports he has completed his course of antibiotics. He reports he is doing relatively  well from a stent tolerance standpoint. He denies any fevers, chills, dysuria, hematuria he does notice that he has a little bit of flank discomfort if he is particularly active. He continues to catheterize with 12Fr catheters without issue; no supply concerns. Interestingly, he does report that he was able to be seen at outside hospital for prostate biopsy. He says that this has been completed. He does not know his Gleason score or grading group but does report that it is "high-grade". He apparently has a CT scan and a bone scan scheduled. He intends to keep these appointments. These records are not available for review. His sister is with him today who reports that he now has insurance. He would like to have his stone treated here and ideally consolidate his urologic care in the near future.     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 Elevated PSA - 03/28/2019 Renal calculus - 03/28/2019 Ureteral calculus (Stable) - 03/28/2019, Calculus of ureter, - 2014      PMH Notes:  1898-02-21 00:00:00 - Note: Normal Routine History And Physical Adult  2012-03-13 13:51:37 - Note: Coronary Artery Disease   NON-GU  PMH: Personal history of other endocrine, nutritional and metabolic disease, History of obesity - 2014 Diabetes Type 2 Hypercholesterolemia Hypertension    FAMILY HISTORY: Acute Myocardial Infarction - Runs In Family Cardiovascular Disorder - Runs In Family Diabetes - Father   SOCIAL HISTORY: Marital Status: Single Preferred Language: English; Ethnicity: Not Hispanic Or Latino; Race:  White Current Smoking Status: Patient does not smoke anymore. Has not smoked since 03/24/2009. Smoked for 40 years. Smoked less than 1/2 pack per day.   Tobacco Use Assessment Completed: Used Tobacco in last 30 days? Has never drank.  Drinks 2 caffeinated drinks per day. Patient's occupation is/was Unemployed.     Notes: Tobacco use, Marital History - Single, Alcohol Use, Occupation:   REVIEW OF SYSTEMS:    GU Review Male:   Patient reports frequent urination, hard to postpone urination, and get up at night to urinate. Patient denies burning/ pain with urination, leakage of urine, stream starts and stops, trouble starting your stream, have to strain to urinate , erection problems, and penile pain.  Gastrointestinal (Upper):   Patient denies nausea, vomiting, and indigestion/ heartburn.  Gastrointestinal (Lower):   Patient reports constipation. Patient denies diarrhea.  Constitutional:   Patient denies fever, night sweats, weight loss, and fatigue.  Skin:   Patient denies skin rash/ lesion and itching.  Eyes:   Patient denies blurred vision and double vision.  Ears/ Nose/ Throat:   Patient denies sore throat and sinus problems.  Hematologic/Lymphatic:   Patient denies swollen glands and easy bruising.  Cardiovascular:   Patient denies leg swelling and chest pains.  Respiratory:   Patient denies cough and shortness of breath.  Endocrine:   Patient denies excessive thirst.  Musculoskeletal:   Patient reports back pain. Patient denies joint pain.  Neurological:   Patient denies headaches and dizziness.  Psychologic:   Patient denies depression and anxiety.   Notes: Right side back pain worse with ambulation.     VITAL SIGNS:   BP 134/83 (BP Location: Right Arm)   Pulse 95   Temp 97.7 F (36.5 C) (Oral)   Resp 17   Ht 6\' 1"  (1.854 m)   Wt 111.2 kg   SpO2 100%   BMI 32.34 kg/m   MULTI-SYSTEM PHYSICAL EXAMINATION:    Constitutional: Well-nourished. No physical deformities. Normally  developed. Good grooming.  Respiratory: No labored breathing, no use of accessory muscles.   Cardiovascular: Normal temperature, normal extremity pulses, no swelling, no varicosities.  Lymphatic: No enlargement of neck, axillae, groin.  Skin: No paleness, no jaundice, no cyanosis. No lesion, no ulcer, no rash.  Neurologic / Psychiatric: Oriented to time, oriented to place, oriented to person. No depression, no anxiety, no agitation.  Gastrointestinal: No mass, no tenderness, no rigidity, non obese abdomen.  Musculoskeletal: Normal gait and station of head and neck.    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 He would like to proceed with cystourethroscopy, cystoscopy litholapaxy, left ureteroscopic stone extraction with laser lithotripsy. We discussed the risk and benefits of this procedure. He will require a follow-up appointment after with renal ultrasound. Given his multiple prior episodes, he would  be a candidate for metabolic evaluation. He will require a urine culture prior given his prior bacteremia, stent, bladder stones, and CIC.   Plan for OR today.   Elevated PSA: Prior history of elevated PSA. Reportedly has now had a biopsy performed by Dr. Harlow Asa. These records are not available for review. Sounds as though he has a CT scan and bone scan set up there. He plans to keep these appointments. If you would like to consolidate his care in the future, we would be happy to arrange for this.   Neurogenic Bladder: History of areflexic bladder. Continues to do well on 12 French straight continuous intermittent catheterization. We will plan to continue this regimen.

## 2019-08-07 NOTE — Discharge Instructions (Signed)
1. You will see some blood in the urine and may have some burning with urination for 48-72 hours. You also may notice that you have to urinate more frequently or urgently after your procedure which is normal.  Return to your normal catheterization routine.  Please ensure that the stent string remains attached to the penile shaft and does not get pushed into the urethra when you catheterize. 2. You should call should you develop an inability urinate, fever > 101, persistent nausea and vomiting that prevents you from eating or drinking to stay hydrated.  3. You have a stent, you will likely urinate more frequently and urgently until the stent is removed and you may experience some discomfort/pain in the lower abdomen and flank especially when urinating. You may take pain medication prescribed to you if needed for pain. You may also intermittently have blood in the urine until the stent is removed.  4. Please continue the antibiotics that you were prescribed.  Please remove your ureteral stent in 5 days on 08/12/19 by gently pulling on the string until the entire stent is removed.  The stent is light blue in color and approximately 1 foot in length.

## 2019-08-07 NOTE — Op Note (Addendum)
Preoperative Diagnosis:  -Left ureteral calculus -Left renal calculi -Bladder stones  Postoperative Diagnosis:  Same  Procedure(s) Performed:   1. Cystourethroscopy 2. Cystolitholapaxy <2cm 3. Left ureteroscopic stone extraction  4. Left retrograde pyelogram 5. Left ureteral stent exchange, 6Fr x 26cm JJ ureteral stent with dangler 5. Intraoperative fluoroscopy with interpretation <1hr.   Teaching Surgeon:  Louis Meckel, MD  Resident Surgeon:  Sharlot Gowda, MD  Assistant(s):  None  Anesthesia:  General   Fluids:  See anesthesia record  Estimated blood loss:  59ml mL  Specimens:  Left ureteral calculus for stone analysis  Drains:  LEFT 6Fr x 26cm JJ ureteral stent with dangler  Complications:  None  Indications: 59 y.o. patient with a history of nephrolithiasis, recently underwent left ureteral stent placement for obstructing left proximal ureteral stone.  Patient also has a history incomplete emptying and performs clean intermittent catheterization.  He has no bladder calculi and intermittent hematuria.  He has had elevated PSA with biopsy confirmed prostate cancer who is being managed at an outside facility.. Risks & benefits of the procedure discussed with the patient, who wishes to proceed.  Findings:    Mild wide bore bulbar urethral stenosis, easily traversed with 22 rigid cystoscope  Bilateral prostatic hypertrophy, right lobe with apical nodule  Bladder with mild trabeculations, orthotopic bilateral ureteral orifices  Left indwelling ureteral stent with significant mucus/debris  Multiple small bladder stones, soft in appearance and easily crushed with rigid cystoscope grasper and evacuated with irrigation.  Uncomplicated removal of left indwelling ureteral stent, retrograde pyelogram with duplicated collecting system to approximately the mid ureter.  2 Soft appearing stones within the left distal ureter, basket extracted in their entirety  Lower pole moiety  with significant debris, approximately 5 individual stones which were all basketed in their entirety.  No stones or debris within the upper pole moiety.  Retrograde pyelogram without extravasation or residual stone burden  Uncomplicated placement of left 6 French by 26 cm JJ ureteral stent into the lower pole moiety.  Radiologic Interpretation of Retrograde Pyelogram: Left retrograde pyelogram demonstrated no contrast extravasation, filling defects or evidence of hydroureteronephrosis.  Description:  The patient was correctly identified in the preop holding area where written informed consent as well potential risk and complication reviewed. The patient agreed. They were brought back to the operative suite where a preinduction timeout was performed. Once correct information was verified, general anesthesia was induced. They were then gently placed into dorsal lithotomy position with SCDs in place for VTE prophylaxis. They were prepped and draped in the usual sterile fashion and given appropriate preoperative antibiotics with Gentamicin and Ancef. A second timeout was then performed.   We inserted a 67F rigid cystoscope per urethra with copious lubrication and normal saline irrigation running. This demonstrated findings as described above.    Cystolitholapaxy: Multiple small stones were crushed with the rigid cystoscope grasper and evacuated through the cystoscope sheath using irrigation until all stone burden and fragments were removed from the bladder.  Left ureteral stent removal: We turned our attention to the left ureteral orifice with a ureteral stent emanating with mild encrustation and appropriate curl within the bladder.  A sensor wire was placed under fluoroscopic guidance into the upper pole moiety.  We grasped the distal end of the ureteral stent and brought it to the ureteral orifice with the flexible graspers. A sensor wire was then advanced through the ureteral stent under fluoroscopic  guidance and an appropriate curl was noted in the expected location  of the left lower pole moiety. We then removed the cystoscope leaving our sensor wires in place.  Left ureteral stone extraction: Using a flexible ureteroscope, this was passed into the ureteral orifice alongside the wires, 2 soft stones were seen in the distal ureter.  A 0 tip nitinol basket was used to basket extract the stones in their entirety.  Reinspection of the area demonstrated no residual stone burden.  Left renal stone extraction: We then advanced the flexible ureteroscope to the upper pole which demonstrated no stone burden.  A retrograde pyelogram demonstrated no extravasation, filling defects or other abnormalities.  We then pulled the ureteroscope back to the level of the duplicated system which was in approximately the mid ureter and advanced ureteroscope into the lower pole moiety ureter and collecting system.  There is a significant amount of debris passed what appeared to be the ureteropelvic junction for this lower pole moiety.  This was evacuated with a syringe and irrigated with low pressure to provide optimize visualization.  There were 5 stones within this lower moiety.  Given the need to remove these, we elected to place ureteral access sheath.  The upper pole moiety wire was removed and inserted through the ureteroscope into the lower moiety as well.  The scope was removed.  We then obtained a 12/14Fr x 35cm ureteral access sheath which was easily passed over the sensor wire into the proximal ureter just distal to the UPJ under fluoroscopic guidance. The inner cannula was removed and was replaced by the flexible ureteroscope and ureteroscopy was performed.   All individual stones were basket extracted using a zero-tip nitinol wire basket without complication. Stone fragments were sent for chemical analysis. We then re-explored the location of stone treatment which demonstrated no residual stone burden or debris.  Retrograde pyelogram demonstrated no contrast extravasation.  Pullback ureteroscopy was performed which demonstrated no residual stone fragments within the ureter and no mucosal abnormalities.  1 wire had been left in place.  The bladder was emptied using the rigid cystoscope.  At this point we elected to leave a ureteral stent and withdrew our instruments leaving a sensor wire in place. We then advanced a 6Fr x 26cm JJ ureteral stent with dangler with the assistance of a stent pusher under fluoroscopic guidance  without difficultly. Sensor wire removal demonstrated satisfactory stent curl proximally in the lower moiety pelvis and distally in the bladder.  The stent string was appropriately fixed to the penile shaft with Tegaderm. the patient was woken up from anesthesia and taken to the recovery unit for routine postoperative care.   Post Op Plan:   1.  Patient may void with PVR or perform catheterization in PACU given his known incomplete bladder emptying/dysfunctional voiding. 2.  Patient to remove ureteral stent at home in 5 days after completing antibiotics. 3.  Follow-up in clinic in approximately 6 to 8 weeks for renal ultrasound to evaluate for hydronephrosis 4.  We will obtain records from outside provider regarding his recent prostate biopsy for second opinion on his newly diagnosed prostate cancer.

## 2019-08-07 NOTE — Anesthesia Procedure Notes (Signed)
Procedure Name: LMA Insertion Date/Time: 08/07/2019 9:53 AM Performed by: Sharlette Dense, CRNA Patient Re-evaluated:Patient Re-evaluated prior to induction Oxygen Delivery Method: Circle system utilized Preoxygenation: Pre-oxygenation with 100% oxygen Induction Type: IV induction Ventilation: Mask ventilation without difficulty LMA: LMA with gastric port inserted LMA Size: 4.0 Number of attempts: 1 Placement Confirmation: positive ETCO2 and breath sounds checked- equal and bilateral Tube secured with: Tape Dental Injury: Teeth and Oropharynx as per pre-operative assessment

## 2019-08-07 NOTE — Anesthesia Postprocedure Evaluation (Signed)
Anesthesia Post Note  Patient: Shane Bond  Procedure(s) Performed: CYSTOSCOPY WITH LEFT RETROGRADE PYELOGRAM, URETEROSCOPY HOLMIUM LASER AND STENT PLACEMENT (Left )     Patient location during evaluation: PACU Anesthesia Type: General Level of consciousness: awake and alert Pain management: pain level controlled Vital Signs Assessment: post-procedure vital signs reviewed and stable Respiratory status: spontaneous breathing, nonlabored ventilation, respiratory function stable and patient connected to nasal cannula oxygen Cardiovascular status: blood pressure returned to baseline and stable Postop Assessment: no apparent nausea or vomiting Anesthetic complications: no   No complications documented.  Last Vitals:  Vitals:   08/07/19 1219 08/07/19 1245  BP: 117/82 115/76  Pulse: 88 87  Resp: 16   Temp: (!) 36.3 C   SpO2: 93% 95%    Last Pain:  Vitals:   08/07/19 1219  TempSrc: Oral  PainSc: 0-No pain                 Barnet Glasgow

## 2019-08-07 NOTE — Interval H&P Note (Signed)
History and Physical Interval Note:  08/07/2019 9:14 AM  Shane Bond  has presented today for surgery, with the diagnosis of LEFT URETERAL STONE.  The various methods of treatment have been discussed with the patient and family. After consideration of risks, benefits and other options for treatment, the patient has consented to  Procedure(s): CYSTOSCOPY WITH LEFT RETROGRADE PYELOGRAM, URETEROSCOPY HOLMIUM LASER AND STENT PLACEMENT (Left) as a surgical intervention.  The patient's history has been reviewed, patient examined, no change in status, stable for surgery.  I have reviewed the patient's chart and labs.  Questions were answered to the patient's satisfaction.     Ardis Hughs

## 2019-08-07 NOTE — Interval H&P Note (Signed)
History and Physical Interval Note:  08/07/2019 9:26 AM  Shane Bond  has presented today for surgery, with the diagnosis of LEFT URETERAL STONE.  The various methods of treatment have been discussed with the patient and family. After consideration of risks, benefits and other options for treatment, the patient has consented to  Procedure(s): CYSTOSCOPY WITH LEFT RETROGRADE PYELOGRAM, URETEROSCOPY HOLMIUM LASER AND STENT PLACEMENT (Left) as a surgical intervention.  The patient's history has been reviewed, patient examined, no change in status, stable for surgery.  I have reviewed the patient's chart and labs.  Questions were answered to the patient's satisfaction.     [name]

## 2019-08-07 NOTE — Transfer of Care (Signed)
Immediate Anesthesia Transfer of Care Note  Patient: Shane Bond  Procedure(s) Performed: CYSTOSCOPY WITH LEFT RETROGRADE PYELOGRAM, URETEROSCOPY HOLMIUM LASER AND STENT PLACEMENT (Left )  Patient Location: PACU  Anesthesia Type:General  Level of Consciousness: awake, oriented, patient cooperative and responds to stimulation  Airway & Oxygen Therapy: Patient Spontanous Breathing and Patient connected to face mask oxygen  Post-op Assessment: Report given to RN and Post -op Vital signs reviewed and stable  Post vital signs: Reviewed and stable  Last Vitals:  Vitals Value Taken Time  BP 126/82 08/07/19 1110  Temp    Pulse 86 08/07/19 1113  Resp 20 08/07/19 1113  SpO2 97 % 08/07/19 1113  Vitals shown include unvalidated device data.  Last Pain:  Vitals:   08/07/19 0741  TempSrc:   PainSc: 0-No pain         Complications: No complications documented.

## 2019-08-08 ENCOUNTER — Encounter (HOSPITAL_COMMUNITY): Payer: Self-pay | Admitting: Urology

## 2019-08-12 ENCOUNTER — Other Ambulatory Visit (HOSPITAL_COMMUNITY): Payer: Self-pay | Admitting: Urology

## 2019-08-12 ENCOUNTER — Other Ambulatory Visit: Payer: Self-pay | Admitting: Urology

## 2019-08-12 DIAGNOSIS — N201 Calculus of ureter: Secondary | ICD-10-CM

## 2019-08-14 ENCOUNTER — Ambulatory Visit: Payer: Self-pay | Attending: Nurse Practitioner | Admitting: Nurse Practitioner

## 2019-08-14 ENCOUNTER — Other Ambulatory Visit: Payer: Self-pay | Admitting: Nurse Practitioner

## 2019-08-14 ENCOUNTER — Other Ambulatory Visit: Payer: Self-pay

## 2019-08-14 ENCOUNTER — Encounter: Payer: Self-pay | Admitting: Nurse Practitioner

## 2019-08-14 DIAGNOSIS — E782 Mixed hyperlipidemia: Secondary | ICD-10-CM

## 2019-08-14 DIAGNOSIS — Z794 Long term (current) use of insulin: Secondary | ICD-10-CM

## 2019-08-14 DIAGNOSIS — E1165 Type 2 diabetes mellitus with hyperglycemia: Secondary | ICD-10-CM

## 2019-08-14 DIAGNOSIS — Z1211 Encounter for screening for malignant neoplasm of colon: Secondary | ICD-10-CM

## 2019-08-14 MED ORDER — CANAGLIFLOZIN 100 MG PO TABS: 100.0000 mg | ORAL_TABLET | Freq: Every day | ORAL | 3 refills | Status: DC

## 2019-08-14 MED ORDER — LANTUS SOLOSTAR 100 UNIT/ML ~~LOC~~ SOPN: 50.0000 [IU] | PEN_INJECTOR | Freq: Every day | SUBCUTANEOUS | 3 refills | Status: DC

## 2019-08-14 MED ORDER — OMEGA-3-ACID ETHYL ESTERS 1 G PO CAPS: 2.0000 g | ORAL_CAPSULE | Freq: Two times a day (BID) | ORAL | 2 refills | Status: DC

## 2019-08-14 MED FILL — GEMFIBROZIL 600 MG TAB: 600 | 30 days supply | Qty: 60 | Fill #0

## 2019-08-14 MED FILL — $LANTUS SOLOSTAR 100 UNITS/: 100 | 90 days supply | Qty: 45 | Fill #0

## 2019-08-14 MED FILL — OMEGA-3 ETHYL ESTERS 1 GM C: 1 | 30 days supply | Qty: 60 | Fill #5

## 2019-08-14 MED FILL — $VICTOZA 2-PAK 18MG/3ML PEN: 18 | 30 days supply | Qty: 9 | Fill #0

## 2019-08-14 NOTE — Progress Notes (Signed)
Virtual Visit via Telephone Note Due to national recommendations of social distancing due to [place] 19, telehealth visit is felt to be most appropriate for this patient at this time.  I discussed the limitations, risks, security and privacy concerns of performing an evaluation and management service by telephone and the availability of in person appointments. I also discussed with the patient that there may be a patient responsible charge related to this service. The patient expressed understanding and agreed to proceed.    I connected with Shane Bond on 08/14/19  at  11:10 AM EDT  EDT by telephone and verified that I am speaking with the correct person using two identifiers.   Consent I discussed the limitations, risks, security and privacy concerns of performing an evaluation and management service by telephone and the availability of in person appointments. I also discussed with the patient that there may be a patient responsible charge related to this service. The patient expressed understanding and agreed to proceed.   Location of Patient: Private Residence   Location of Provider: [place] and [employer] Office    Persons participating in Telemedicine visit: Shane Rankins FNP-BC Bruce    History of Present Illness: Telemedicine visit for: F/U History of hypertension, diabetes, dyslipidemia, neurogenic bladder resulting from previous back surgery, BPH with intermittent self-catheterization and most recently diagnosed with prostate CA.  Currently being followed by urology, hematology and radiation oncology.   DM TYPE 2 Blood sugar 181 this morning.  A1c is not well controlled related to weight, diet and sedentary lifestyle.  He endorses medication adherence taking Lantus 50 units at bedtime, Victoza 1.8 mg daily and Metformin 1000 mg twice daily. I am also adding invokana today to help lower his risk of diabetic complications. LDL not at goal.  He is statin intolerant.  Currently taking gemfibrozil 600 mg twice daily and Lovaza 1 g twice daily.  Will increase Lovaza to 2 g twice daily today. Lab Results  Component Value Date   HGBA1C 8.3 (H) 08/01/2019   Lab Results  Component Value Date   LDLCALC 101 (H) 11/05/2018   BP Readings from Last 3 Encounters:  08/07/19 115/76  08/01/19 130/76  06/25/19 135/62     Past Medical History:  Diagnosis Date  . 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 Anemia   . Bladder atonia   . BPH (benign prostatic hyperplasia)   . CAD, NATIVE VESSEL 10/29/2009  . Cancer [facility])    prostate  . Diabetes mellitus type 2, uncontrolled, with complications ([place])   . Hyperlipidemia LDL goal <70   . Hypertension   . Kidney stone   . OBESITY 10/22/2009    Past Surgical History:  Procedure Laterality Date  . BACK SURGERY    . CORONARY STENT PLACEMENT    . CYSTOSCOPY W/ URETERAL STENT PLACEMENT  02/26/2012   Procedure: CYSTOSCOPY WITH RETROGRADE PYELOGRAM/URETERAL STENT PLACEMENT;  Surgeon: Bernestine Amass, MD;  Location: [facility];  Service: Urology;  Laterality: Left;  . CYSTOSCOPY WITH RETROGRADE PYELOGRAM, URETEROSCOPY AND STENT PLACEMENT Left 08/07/2019   Procedure: CYSTOSCOPY WITH LEFT RETROGRADE PYELOGRAM, URETEROSCOPY HOLMIUM LASER AND STENT PLACEMENT;  Surgeon: Ardis Hughs, MD;  Location: [facility];  Service: Urology;  Laterality: Left;  . CYSTOSCOPY WITH STENT PLACEMENT Left 06/24/2019   Procedure: CYSTOSCOPY WITH STENT PLACEMENT LEFT URETER WITH LEFT RETROGRADE URETERAL;  Surgeon: Ceasar Mons, MD;  Location: [facility];  Service: Urology;  Laterality: Left;  . testicles Right  Family History  Problem Relation Age of Onset  . Diabetes Father   . Coronary artery disease Other   . Heart attack Other   . Diabetes Paternal Aunt     Social History   Socioeconomic History  . Marital status: Single    Spouse name: Not on file  . Number of children: Not on file  . Years of education: Not on  file  . Highest education level: Not on file  Occupational History  . Not on file  Tobacco Use  . Smoking status: Current Every Day Smoker    Types: E-cigarettes  . Smokeless tobacco: Former Network engineer  . Vaping Use: Every day  Substance and Sexual Activity  . Alcohol use: No  . Drug use: No  . Sexual activity: Not Currently  Other Topics Concern  . Not on file  Social History Narrative  . Not on file   Social Determinants of Health   Financial Resource Strain:   . Difficulty of Paying Living Expenses:   Food Insecurity:   . Worried About Charity fundraiser in the Last Year:   . Arboriculturist in the Last Year:   Transportation Needs:   . Film/video editor (Medical):   [name] Lack of Transportation (Non-Medical):   Physical Activity:   . Days of Exercise per Week:   . Minutes of Exercise per Session:   Stress:   . Feeling of Stress :   Social Connections:   . Frequency of Communication with Friends and Family:   . Frequency of Social Gatherings with Friends and Family:   . Attends Religious Services:   . Active Member of Clubs or Organizations:   . Attends Archivist Meetings:   [name] Marital Status:      Observations/Objective: Awake, alert and oriented x 3   Review of Systems  Constitutional: Negative for fever, malaise/fatigue and weight loss.  HENT: Negative.  Negative for nosebleeds.   Eyes: Negative.  Negative for blurred vision, double vision and photophobia.  Respiratory: Negative.  Negative for cough and shortness of breath.   Cardiovascular: Negative.  Negative for chest pain, palpitations and leg swelling.  Gastrointestinal: Negative.  Negative for heartburn, nausea and vomiting.  Musculoskeletal: Negative.  Negative for myalgias.  Neurological: Negative.  Negative for dizziness, focal weakness, seizures and headaches.  Psychiatric/Behavioral: Negative.  Negative for suicidal ideas.    Assessment and Plan: Shane Bond was seen today for  follow-up.  Diagnoses and all orders for this visit:  Uncontrolled type 2 diabetes mellitus with hyperglycemia, with long-term current use of insulin (HCC) -     insulin glargine (LANTUS SOLOSTAR) 100 UNIT/ML Solostar Pen; Inject 50 Units into the skin at bedtime. -     Lipid panel -     Ambulatory referral to Ophthalmology -     canagliflozin (INVOKANA) 100 MG TABS tablet; Take 1 tablet (100 mg total) by mouth daily before breakfast. -     omega-3 acid ethyl esters (LOVAZA) 1 g capsule; Take 2 capsules (2 g total) by mouth 2 (two) times daily. Continue blood sugar control as discussed in office today, low carbohydrate diet, and regular physical exercise as tolerated, 150 minutes per week (30 min each day, 5 days per week, or 50 min 3 days per week). Keep blood sugar logs with fasting goal of 90-130 mg/dl, post prandial (after you eat) less than 180.  For Hypoglycemia: BS <60 and Hyperglycemia BS >400; contact the clinic ASAP.  Annual eye exams and foot exams are recommended.   Mixed hyperlipidemia -     omega-3 acid ethyl esters (LOVAZA) 1 g capsule; Take 2 capsules (2 g total) by mouth 2 (two) times daily. INSTRUCTIONS: Work on a low fat, heart healthy diet and participate in regular aerobic exercise program by working out at least 150 minutes per week; 5 days a week-30 minutes per day. Avoid red meat/beef/steak,  fried foods. junk foods, sodas, sugary drinks, unhealthy snacking, alcohol and smoking.  Drink at least 80 oz of water per day and monitor your carbohydrate intake daily.   Colon cancer screening -     Fecal occult blood, imunochemical([facility])     Follow Up Instructions Return in about 3 months (around 11/14/2019).     I discussed the assessment and treatment plan with the patient. The patient was provided an opportunity to ask questions and all were answered. The patient agreed with the plan and demonstrated an understanding of the instructions.   The patient was  advised to call back or seek an in-person evaluation if the symptoms worsen or if the condition fails to improve as anticipated.  I provided 18 minutes of non-face-to-face time during this encounter including median intraservice time, reviewing previous notes, labs, imaging, medications and explaining diagnosis and management.  Gildardo Pounds, FNP-BC

## 2019-08-15 LAB — LIPID PANEL
Chol/HDL Ratio: 4.9 ratio (ref 0.0–5.0)
Cholesterol, Total: 175 mg/dL (ref 100–199)
HDL: 36 mg/dL — ABNORMAL LOW (ref >39)
LDL Chol Calc (NIH): 121 mg/dL — ABNORMAL HIGH (ref 0–99)
Triglycerides: 99 mg/dL (ref 0–149)
VLDL Cholesterol Cal: 18 mg/dL (ref 5–40)

## 2019-08-15 MED FILL — INVOKANA 100 MG TABLET: 100 | 30 days supply | Qty: 30 | Fill #0

## 2019-08-16 LAB — CALCULI, WITH PHOTOGRAPH (CLINICAL LAB)
Carbonate Apatite: 60 %
Mg NH4 PO4 (Struvite): 40 %
Weight Calculi: 497 mg

## 2019-08-18 ENCOUNTER — Other Ambulatory Visit: Payer: Self-pay | Admitting: Nurse Practitioner

## 2019-08-18 DIAGNOSIS — R972 Elevated prostate specific antigen [PSA]: Secondary | ICD-10-CM

## 2019-09-02 ENCOUNTER — Other Ambulatory Visit: Payer: Self-pay | Admitting: Family Medicine

## 2019-09-02 DIAGNOSIS — Z794 Long term (current) use of insulin: Secondary | ICD-10-CM

## 2019-09-02 MED FILL — metFORMIN HCL 1000 MG TABS: 1000 | 30 days supply | Qty: 60 | Fill #1

## 2019-09-03 ENCOUNTER — Ambulatory Visit
Admission: RE | Admit: 2019-09-03 | Discharge: 2019-09-03 | Disposition: A | Discharge status: Home / Self Care | Payer: Self-pay | Source: Ambulatory Visit | Attending: Radiation Oncology | Admitting: Radiation Oncology

## 2019-09-03 ENCOUNTER — Other Ambulatory Visit: Payer: Self-pay

## 2019-09-03 ENCOUNTER — Encounter: Payer: Self-pay | Admitting: Radiation Oncology

## 2019-09-03 VITALS — Ht 73.0 in | Wt 245.0 lb

## 2019-09-03 DIAGNOSIS — C61 Malignant neoplasm of prostate: Secondary | ICD-10-CM | POA: Insufficient documentation

## 2019-09-03 HISTORY — DX: Other sequelae of cerebral infarction: I69.398

## 2019-09-03 HISTORY — DX: Neuromuscular dysfunction of bladder, unspecified: N31.9

## 2019-09-03 HISTORY — DX: Malignant neoplasm of prostate: C61

## 2019-09-03 NOTE — Progress Notes (Signed)
This is n/a because the patient has a neurogenic bladder s/p spinal cord injury. Patient self catheterizes up to every 45 minutes dependent upon blood sugar level.

## 2019-09-03 NOTE — Progress Notes (Signed)
GU Location of Tumor / Histology: prostatic adenocarcinoma  If Prostate Cancer, Gleason Score is (4 + 4) and PSA is (62.8). Prostate volume: [place] presented to PCP with an abnormal DRE and elevated PSA.   Biopsies of prostate (if applicable) revealed:   Past/Anticipated interventions by urology, if any: prostate biopsy, CT abd/pelvis (negative), bone scan (negative), referral to Dr. Pablo Ledger to discuss radiation therapy options  Past/Anticipated interventions by medical oncology, if any: no  Weight changes, if any: denies  Bowel/Bladder complaints, if any: Patient has a neurogenic bladder related to a spinal cord injury. Patient self catheterizes up to every 45 minutes if blood sugar is elevated. IPSS 0 since its not applicable. SHIM 1. Denies dysuria or hematuria unless of UTI related to frequent self catheterizations. Denies urinary leakage. Denies any bowel complaints.  Nausea/Vomiting, if any: no  Pain issues, if any:  no  SAFETY ISSUES:  Prior radiation? no  Pacemaker/ICD? no  Possible current pregnancy? no, male patient  Is the patient on methotrexate? no  Current Complaints / other details:  59 year old male. Single. Resides just down the street from [facility]. Disabled from spinal cord injury.

## 2019-09-03 NOTE — Progress Notes (Signed)
Radiation Oncology         [phone] ________________________________  Initial Outpatient Consultation - Conducted via Telephone due to current COVID-19 concerns for limiting patient exposure  Name: Shane Bond MRN: [id number]  Date: 09/03/2019  DOB: 05/14/1960  ZS:[id number], Vernia Buff, NP  Thea Silversmith, MD   REFERRING PHYSICIAN: Thea Silversmith, MD  DIAGNOSIS: 59 y.o. 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    ICD-10-CM   1. Malignant neoplasm of prostate ([place])  C61     HISTORY OF PRESENT ILLNESS: Shane Bond is a 59 y.o. male with a diagnosis of prostate cancer. 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  He is followed in urology by Dr. Delfino Lovett Puschinsky, and was recently noted to have an elevated PSA of 57 and abnormal DRE indicating cT2b disease.  A repeat PSA was further elevated at 62.8.  The patient proceeded to transrectal ultrasound with 12 biopsies of the prostate on 07/04/2019.  The prostate volume measured 48 cc. Out of 12 core biopsies, 8 were positive.  The maximum Gleason score was 4+4 and this was seen in the right mid lateral core.  Additionally, Gleason 4+3 disease was noted in the right base lateral and Gleason 3+4 disease in the left base, left base lateral, left mid lateral, right base, right mid and right apex lateral..  Disease staging studies with CT A/P and bone scan on 08/16/2019 revealed mildly asymmetric enhancement in the peripheral zone of the prostate gland, greater on the right than the left but no evidence of visceral or osseous metastatic disease.  There was an area of increased uptake at the left parietal calvarium on bone scan imaging but further evaluation with skull radiographs was negative for evidence of osseous metastatic disease.   He met with Dr. Thea Silversmith in radiation oncology  at [facility] on 08/09/2019 who recommended LT-ADT concurrent with 8 weeks of prostate IMRT which the patient is interested in proceeding with.  However, the patient lives in [place] and prefers to have his daily radiation treatments here locally, closer to home.    The patient has kindly been referred today for discussion of potential radiation treatment options closer to home here in [place].   PREVIOUS RADIATION THERAPY: No  PAST MEDICAL HISTORY:  Past Medical History:  Diagnosis Date  . 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 Anemia   . Bladder atonia   . BPH (benign prostatic hyperplasia)   . CAD, NATIVE VESSEL 10/29/2009  . Diabetes mellitus type 2, uncontrolled, with complications ([place])   . Hyperlipidemia LDL goal <70   . Hypertension   . Kidney stone   . Neurogenic bladder as late effect of cerebrovascular accident (CVA)   . OBESITY 10/22/2009  . Prostate cancer ([place])       PAST SURGICAL HISTORY: Past Surgical History:  Procedure Laterality Date  . BACK SURGERY    . CORONARY STENT PLACEMENT    . CYSTOSCOPY W/ URETERAL STENT PLACEMENT  02/26/2012   Procedure: CYSTOSCOPY WITH RETROGRADE PYELOGRAM/URETERAL STENT PLACEMENT;  Surgeon: Bernestine Amass, MD;  Location: [facility];  Service: Urology;  Laterality: Left;  . CYSTOSCOPY WITH RETROGRADE PYELOGRAM, URETEROSCOPY AND STENT PLACEMENT Left 08/07/2019   Procedure: CYSTOSCOPY WITH LEFT RETROGRADE PYELOGRAM, URETEROSCOPY HOLMIUM LASER AND STENT PLACEMENT;  Surgeon: Ardis Hughs, MD;  Location: [facility];  Service: Urology;  Laterality:  Left;  . CYSTOSCOPY WITH STENT PLACEMENT Left 06/24/2019   Procedure: CYSTOSCOPY WITH STENT PLACEMENT LEFT URETER WITH LEFT RETROGRADE URETERAL;  Surgeon: Ceasar Mons, MD;  Location: [facility];  Service: Urology;  Laterality: Left;  . testicles Right     FAMILY HISTORY:  Family History  Problem Relation Age of Onset  . Diabetes Father   . Coronary artery disease  Other   . Heart attack Other   . Diabetes Paternal Aunt   . Breast cancer Neg Hx   . Colon cancer Neg Hx   . Pancreatic cancer Neg Hx   . Prostate cancer Neg Hx     SOCIAL HISTORY:  Social History   Socioeconomic History  . Marital status: Single    Spouse name: Not on file  . Number of children: Not on file  . Years of education: Not on file  . 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 Smoking status: Current Every Day Smoker    Types: E-cigarettes  . Smokeless tobacco: Former Network engineer  . Vaping Use: Every day  Substance and Sexual Activity  . Alcohol use: No  . Drug use: No  . Sexual activity: Not Currently  Other Topics Concern  . Not on file  Social History Narrative  . Not on file   Social Determinants of Health   Financial Resource Strain:   . Difficulty of Paying Living Expenses:   Food Insecurity:   . Worried About Charity fundraiser in the Last Year:   . Arboriculturist in the Last Year:   Transportation Needs:   . Film/video editor (Medical):   [name] Lack of Transportation (Non-Medical):   Physical Activity:   . Days of Exercise per Week:   . Minutes of Exercise per Session:   Stress:   . Feeling of Stress :   Social Connections:   . Frequency of Communication with Friends and Family:   . Frequency of Social Gatherings with Friends and Family:   . Attends Religious Services:   . Active Member of Clubs or Organizations:   . Attends Archivist Meetings:   [name] Marital Status:   Intimate Partner Violence:   . Fear of Current or Ex-Partner:   . Emotionally Abused:   [name] Physically Abused:   . Sexually Abused:     ALLERGIES: Lipitor [atorvastatin]  MEDICATIONS:  Current Outpatient Medications  Medication Sig Dispense Refill  . gemfibrozil (LOPID) 600 MG tablet Take 1 tablet (600 mg total) by mouth 2 (two) times daily before a meal. 180 tablet 1  .  metFORMIN (GLUCOPHAGE) 1000 MG tablet TAKE 1 TABLET (1,000 MG TOTAL) BY MOUTH 2 (TWO) TIMES DAILY WITH A MEAL. 60 tablet 2  . omega-3 acid ethyl esters (LOVAZA) 1 g capsule Take 2 capsules (2 g total) by mouth 2 (two) times daily. 360 capsule 2  . Ascorbic Acid (VITAMIN C PO) Take 1 tablet by mouth daily.    [name] ascorbic acid (VITAMIN C) 500 MG tablet Take by mouth.    [name] aspirin EC 81 MG tablet Take 81 mg by mouth daily.    . canagliflozin (INVOKANA) 100 MG TABS tablet Take 1 tablet (100 mg total) by mouth daily before breakfast. 30 tablet 3  . glucose blood (TRUE METRIX BLOOD GLUCOSE TEST) test strip Use as instructed 100 each 12  . insulin glargine (LANTUS  SOLOSTAR) 100 UNIT/ML Solostar Pen Inject 50 Units into the skin at bedtime. 48 mL 3  . Insulin Pen Needle (TRUEPLUS PEN NEEDLES) 32G X 4 MM MISC USE AS DIRECTED 100 each 12  . Misc. Devices MISC Please provide patient with standard sized 65F urinary catheters for self cathing along with lubricant. 50 each prn  . tamsulosin (FLOMAX) 0.4 MG CAPS capsule Take 1 capsule (0.4 mg total) by mouth daily after supper. (Patient not taking: Reported on 05/14/2019) 30 capsule 0  . traMADol (ULTRAM) 50 MG tablet Take 1-2 tablets (50-100 mg total) by mouth every 6 (six) hours as needed for moderate pain. (Patient not taking: Reported on 08/14/2019) 15 tablet 0  . TRUEplus Lancets 28G MISC 1 each by Does not apply route 4 (four) times daily - after meals and at bedtime. 100 each 12  . VICTOZA 18 MG/3ML SOPN INJECT 1.8MG INTO THE SKIN ONCE DAILY 9 mL 0   No current facility-administered medications for this encounter.    REVIEW OF SYSTEMS:  On review of systems, the patient reports that he is doing well overall. He denies any chest pain, shortness of breath, cough, fevers, chills, night sweats, or unintended weight changes. He denies any bowel disturbances, and denies abdominal pain, nausea or vomiting. He denies any new musculoskeletal or joint aches or pains.   The patient has a neurogenic bladder and therefore has no urinary symptoms.  He does self catheterize up to every 45 minutes when his blood sugar is elevated but otherwise generally catheterizes 4-5 times per day.  His SHIM was 1, indicating he has severe erectile dysfunction. A complete review of systems is obtained and is otherwise negative.   PHYSICAL EXAM:  Wt Readings from Last 3 Encounters:  09/03/19 245 lb (111.1 kg)  08/07/19 245 lb 2.4 oz (111.2 kg)  08/01/19 245 lb 2 oz (111.2 kg)   Temp Readings from Last 3 Encounters:  08/07/19 (!) 97.3 F (36.3 C) (Oral)  08/01/19 98 F (36.7 C) (Oral)  06/25/19 (!) 97.5 F (36.4 C) (Oral)   BP Readings from Last 3 Encounters:  08/07/19 115/76  08/01/19 130/76  06/25/19 135/62   Pulse Readings from Last 3 Encounters:  08/07/19 87  08/01/19 98  06/25/19 76   Pain Assessment Pain Score: 0-No pain/10  Physical exam not performed in light of telephone consult visit format.   KPS = 90  100 - Normal; no complaints; no evidence of disease. 90   - Able to carry on normal activity; minor signs or symptoms of disease. 80   - Normal activity with effort; some signs or symptoms of disease. 53   - Cares for self; unable to carry on normal activity or to do active work. 60   - Requires occasional assistance, but is able to care for most of his personal needs. 50   - Requires considerable assistance and frequent medical care. 67   - Disabled; requires special care and assistance. 27   - Severely disabled; hospital admission is indicated although death not imminent. 61   - Very sick; hospital admission necessary; active supportive treatment necessary. 10   - Moribund; fatal processes progressing rapidly. 0     - Dead  Karnofsky DA, Abelmann WH, Craver LS and Burchenal [facility] [phone]) The use of the nitrogen mustards in the palliative treatment of carcinoma: with particular reference to bronchogenic carcinoma Cancer 1 634-56  LABORATORY DATA:   Lab Results  Component Value Date   WBC 5.6 08/01/2019  HGB 12.0 (L) 08/01/2019   HCT 36.6 (L) 08/01/2019   MCV 98.9 08/01/2019   PLT 321 08/01/2019   Lab Results  Component Value Date   NA 135 08/01/2019   K 4.5 08/01/2019   CL 103 08/01/2019   CO2 22 08/01/2019   Lab Results  Component Value Date   ALT 80 (H) 06/25/2019   AST 50 (H) 06/25/2019   ALKPHOS 75 06/25/2019   BILITOT 0.5 06/25/2019     RADIOGRAPHY: DG C-Arm 1-60 Min-No Report  Result Date: 08/07/2019 Fluoroscopy was utilized by the requesting physician.  No radiographic interpretation.      IMPRESSION/PLAN: This visit was conducted via Telephone to spare the patient unnecessary potential exposure in the healthcare setting during the current COVID-19 pandemic. 1. 59 y.o. gentleman with Stage T2b adenocarcinoma of the prostate with Gleason Score of 4+4, and PSA of 62.8.  We discussed the patient's workup and outlined the nature of prostate cancer in this setting. The patient's T stage, Gleason's score, and PSA put him into the high risk group. Accordingly, he is eligible for a variety of potential treatment options including prostatectomy or LT-ADT in combination with either 8 weeks of external radiation or brachytherapy followed by 5 weeks of external beam radiation to the prostate and pelvic lymph nodes. We discussed the available radiation techniques, and focused on the details and logistics of delivery. The patient is not an ideal candidate for brachytherapy boost with history of neurogenic bladder requiring chronic self catheterization, increasing the risk of urethral stricture.  We discussed and outlined the risks, benefits, short and long-term effects associated with radiotherapy and compared and contrasted these with prostatectomy. We also detailed the role of ADT in the treatment of high risk prostate cancer and outlined the associated side effects that could be expected with this therapy.  At the end of the  conversation, the patient is interested in moving forward with LT-ADT in combination with 8 weeks of daily radiotherapy.  He is aware of the intentional delay in starting radiotherapy approximately 2 months after the start of ADT to allow for the radiosensitizing effects of this therapy.  Therefore, we will help to coordinate a follow-up visit with Dr. Harlow Asa to start ADT now, and anticipate beginning his daily radiation treatments sometime in September 2021, approximately 2 months after the start of ADT.  He appears to have a good understanding of his disease and our treatment recommendations which are of curative intent and is in agreement with the stated plan.  Therefore, we will share our discussion with Dr. Pablo Ledger and Dr. Harlow Asa and will move forward with treatment planning accordingly.  We enjoyed meeting him today and look forward to continuing to participate in his care.   Given current concerns for patient exposure during the COVID-19 pandemic, this encounter was conducted via telephone. The patient was notified in advance and was offered a MyChart meeting to allow for face to face communication but unfortunately reported that he did not have the appropriate resources/technology to support such a visit and instead preferred to proceed with telephone consult. The patient has given verbal consent for this type of encounter. The time spent during this encounter was 60 minutes. The attendants for this meeting include Tyler Pita MD, Ashlyn Bruning PA-C, [place], and patient, Shane Bond. During the encounter, Tyler Pita MD, Ashlyn Bruning PA-C, and scribe, [employer] were located at [place].  Patient, Shane Bond was located at home.  Nicholos Johns, PA-C    Tyler Pita, MD  [facility] Direct Dial: [phone]  Fax: [phone] [email or web address]  Skype  [email or web address]  This  document serves as a record of services personally performed by Tyler Pita, MD and Freeman Caldron, PA-C. It was created on their behalf by [employer], a trained medical scribe. The creation of this record is based on the scribe's personal observations and the provider's statements to them. This document has been checked and approved by the attending provider.

## 2019-09-09 MED FILL — GEMFIBROZIL 600 MG TAB: 600 | 30 days supply | Qty: 60 | Fill #1

## 2019-09-09 MED FILL — $VICTOZA 2-PAK 18MG/3ML PEN: 18 | 30 days supply | Qty: 9 | Fill #0

## 2019-09-11 ENCOUNTER — Telehealth: Payer: Self-pay | Admitting: Radiation Oncology

## 2019-09-11 NOTE — Telephone Encounter (Signed)
Received voicemail message from [name], patient's sister and transportation, wishing to set up radiation for her brother. Phoned her back and explained her brother will need to receive his hormone injection from Dr. Harlow Asa first then wait approximately 2 months after to begin radiation therapy. Explained the intentional delay in starting radiotherapy approximately 2 months after the start of ADT to allow for the radiosensitizing effects of this therapy. Mardene Celeste verbalized understanding and expressed intent to phone Dr. Puschinsky's office to secure an appointment for ADT.   Mardene Celeste explains that her brother mentioned an [place] may be able to bring him back and forth to radiation appointments. Explained that is an option but has to be arranged. Encouraged she phone this RN back once her brother has received his initial ADT injection so an estimation of when radiation will start is known and paperwork can be submitted. Mardene Celeste committed to doing this and verbalized appreciation for the call.

## 2019-09-16 ENCOUNTER — Other Ambulatory Visit: Payer: Self-pay

## 2019-09-16 ENCOUNTER — Encounter: Payer: Self-pay | Admitting: Medical Oncology

## 2019-09-16 ENCOUNTER — Ambulatory Visit (HOSPITAL_COMMUNITY)
Admission: RE | Admit: 2019-09-16 | Discharge: 2019-09-16 | Disposition: A | Discharge status: Home / Self Care | Payer: Self-pay | Source: Ambulatory Visit | Attending: Urology | Admitting: Urology

## 2019-09-16 DIAGNOSIS — N201 Calculus of ureter: Secondary | ICD-10-CM | POA: Insufficient documentation

## 2019-09-16 NOTE — Progress Notes (Signed)
Spoke with patient to introduce myself as the prostate nurse navigator and discuss my role. I was unable to meet him 7/13, when he consulted with Dr. Tammi Klippel. He has chosen ADT and radiation. I asked if he has been scheduled for ADT with Dr. Hazle Nordmann and he has not been contacted. I will follow up with Enid Derry. He is also interested in transportation when he begins radiation. I will help to arrange once scheduled.

## 2019-09-16 NOTE — Progress Notes (Signed)
Spoke with patient to update him on ADT. Per Enid Derry, because he does not have insurance, Dr. Garth Bigness office is working to see what they can do. He confirmed he does not have insurance and voices understanding of the above. I notified Ashlyn PA of the above.

## 2019-09-19 ENCOUNTER — Encounter: Payer: Self-pay | Admitting: Medical Oncology

## 2019-09-19 NOTE — Progress Notes (Signed)
Left message with Patricia-sister to let her know I spoke with Dr. Puschinsky's office about ADT. I explained that there will be a large out of pocket amount even with insurance and his office will contact them to complete forms for the free Lupron program. I will keep her updated.

## 2019-09-19 NOTE — Progress Notes (Signed)
Spoke with Robin with Dr. Puschinsky's office to get an update on ADT. She states the patient has insurance but will still have a large out of pocket amount. Shirlean Mylar is going to enroll patient in the free Lupron program. She will reach out to patient to get forms completed. She is also worried about cost of gold markers. I informed her that radiation can be done without the gold markers but it will be a longer treatment time. I encouraged her to move forward with getting Lupron assistance so patient can begin treatment.

## 2019-09-19 NOTE — Progress Notes (Signed)
Left message with sister, Mardene Celeste requesting a return call to discuss treatment. He needs ADT and need to see if his urologist will be able to provide due to not insurance coverage.

## 2019-09-20 ENCOUNTER — Encounter: Payer: Self-pay | Admitting: Medical Oncology

## 2019-09-20 MED FILL — $VICTOZA 2-PAK 18MG/3ML PEN: 18 | 30 days supply | Qty: 9 | Fill #0

## 2019-09-20 MED FILL — GEMFIBROZIL 600 MG TAB: 600 | 30 days supply | Qty: 60 | Fill #1

## 2019-09-20 MED FILL — OMEGA-3 ETHYL ESTERS 1 GM C: 1 | 30 days supply | Qty: 120 | Fill #0

## 2019-09-20 NOTE — Progress Notes (Signed)
Sister, Mardene Celeste called asking if I have  updates on the ADT. I informed her Dr. Loman Chroman' office is suppose to be contacting them to complete patient assistant forms. She thought he was going to receive a cortisone injection. I explained to her what ADT is and the purpose. She voiced understanding and appreciated the information. I asked her to call his office and follow up. She thought because he has insurance, it would cover the medication. . I explained that it will on pay a percentage and ADT is very expensive. If we can get him assistance, this will help him financially. She will follow up with urology and I will continue to follow.

## 2019-09-30 ENCOUNTER — Encounter: Payer: Self-pay | Admitting: Medical Oncology

## 2019-09-30 MED FILL — SULFAMETHOXAZOLE-TMP DS TAB: 800-160 | 7 days supply | Qty: 14 | Fill #0

## 2019-09-30 NOTE — Progress Notes (Signed)
Spoke with patient and his sister, Mardene Celeste to see if he has been approved for Lupron. She states she completed the financial assistance forms last week but has not heard any updates. I informed her it will take about a week to hear back once the forms are submitted. I will call his urologist to follow up and give her a call back. She voiced understanding.

## 2019-10-09 MED FILL — metFORMIN HCL 1000 MG TABS: 1000 | 30 days supply | Qty: 60 | Fill #2

## 2019-10-10 ENCOUNTER — Encounter: Payer: Self-pay | Admitting: Medical Oncology

## 2019-10-10 NOTE — Progress Notes (Signed)
Call Dr. Puschinsky's office for update on ADT. I was informed the financial assistance documents were faxed in last week but they have not heard back. I asked if they would call me when he gets approval.

## 2019-10-24 ENCOUNTER — Other Ambulatory Visit: Payer: Self-pay | Admitting: Family Medicine

## 2019-10-24 DIAGNOSIS — E1165 Type 2 diabetes mellitus with hyperglycemia: Secondary | ICD-10-CM

## 2019-10-24 MED FILL — TRUEplus 5-BEVEL PEN NEEDLE: 32G X 4 MM | 30 days supply | Qty: 100 | Fill #3

## 2019-10-24 MED FILL — GEMFIBROZIL 600 MG TAB: 600 | 30 days supply | Qty: 60 | Fill #2

## 2019-10-24 MED FILL — OMEGA-3 ETHYL ESTERS 1 GM C: 1 | 30 days supply | Qty: 120 | Fill #1

## 2019-10-24 MED FILL — $VICTOZA 2-PAK 18MG/3ML PEN: 18 | 30 days supply | Qty: 9 | Fill #0

## 2019-10-31 ENCOUNTER — Encounter: Payer: Self-pay | Admitting: Medical Oncology

## 2019-10-31 NOTE — Progress Notes (Signed)
Spoke with staff in Dr. Puschinsky's office on status of ADT. I was informed they have not received a response from ADT patient assistance program.I asked that they notify me when he is approved.  Shane Bond and [place], [place] notified of the above. I

## 2019-11-04 ENCOUNTER — Other Ambulatory Visit: Payer: Self-pay

## 2019-11-04 ENCOUNTER — Ambulatory Visit: Payer: No Typology Code available for payment source | Attending: Nurse Practitioner | Admitting: Nurse Practitioner

## 2019-11-04 ENCOUNTER — Encounter: Payer: Self-pay | Admitting: Nurse Practitioner

## 2019-11-04 ENCOUNTER — Other Ambulatory Visit: Payer: Self-pay | Admitting: Nurse Practitioner

## 2019-11-04 VITALS — BP 116/75 | HR 92 | Temp 97.7°F | Ht 72.0 in | Wt 252.0 lb

## 2019-11-04 DIAGNOSIS — E785 Hyperlipidemia, unspecified: Secondary | ICD-10-CM

## 2019-11-04 DIAGNOSIS — R972 Elevated prostate specific antigen [PSA]: Secondary | ICD-10-CM

## 2019-11-04 DIAGNOSIS — R399 Unspecified symptoms and signs involving the genitourinary system: Secondary | ICD-10-CM

## 2019-11-04 DIAGNOSIS — F17200 Nicotine dependence, unspecified, uncomplicated: Secondary | ICD-10-CM

## 2019-11-04 DIAGNOSIS — Z794 Long term (current) use of insulin: Secondary | ICD-10-CM

## 2019-11-04 DIAGNOSIS — E1165 Type 2 diabetes mellitus with hyperglycemia: Secondary | ICD-10-CM | POA: Diagnosis not present

## 2019-11-04 DIAGNOSIS — D649 Anemia, unspecified: Secondary | ICD-10-CM

## 2019-11-04 DIAGNOSIS — Z1211 Encounter for screening for malignant neoplasm of colon: Secondary | ICD-10-CM

## 2019-11-04 LAB — POCT GLYCOSYLATED HEMOGLOBIN (HGB A1C): Hemoglobin A1C: 8.8 % — AB (ref 4.0–5.6)

## 2019-11-04 LAB — POCT URINALYSIS DIP (CLINITEK)
Bilirubin, UA: NEGATIVE
Glucose, UA: >=1000 mg/dL — AB
Ketones, POC UA: NEGATIVE mg/dL
Nitrite, UA: POSITIVE — AB
POC PROTEIN,UA: NEGATIVE
Spec Grav, UA: 1.02 (ref 1.010–1.025)
Urobilinogen, UA: 0.2 E.U./dL
pH, UA: 7 (ref 5.0–8.0)

## 2019-11-04 LAB — GLUCOSE, POCT (MANUAL RESULT ENTRY): POC Glucose: 253 mg/dl — AB (ref 70–99)

## 2019-11-04 MED ORDER — CANAGLIFLOZIN 100 MG PO TABS: 100.0000 mg | ORAL_TABLET | Freq: Every day | ORAL | 3 refills | Status: DC

## 2019-11-04 MED ORDER — NITROFURANTOIN MONOHYD MACRO 100 MG PO CAPS: 100.0000 mg | ORAL_CAPSULE | Freq: Two times a day (BID) | ORAL | 0 refills | Status: AC

## 2019-11-04 MED ORDER — VICTOZA 18 MG/3ML ~~LOC~~ SOPN: 1.8000 mg | PEN_INJECTOR | Freq: Every day | SUBCUTANEOUS | 6 refills | Status: DC

## 2019-11-04 MED ORDER — EZETIMIBE 10 MG PO TABS: 10.0000 mg | ORAL_TABLET | Freq: Every day | ORAL | 3 refills | Status: DC

## 2019-11-04 MED FILL — EZETIMIBE 10 MG TABS: 10 | 30 days supply | Qty: 30 | Fill #0

## 2019-11-04 MED FILL — NITROFURANTOIN MONO-MCR 100: 100 | 7 days supply | Qty: 14 | Fill #0

## 2019-11-04 MED FILL — INVOKANA 100 MG TABLET: 100 | 30 days supply | Qty: 30 | Fill #0

## 2019-11-04 MED FILL — $VICTOZA 2-PAK 18MG/3ML PEN: 18 | 60 days supply | Qty: 18 | Fill #0

## 2019-11-04 NOTE — Progress Notes (Signed)
Assessment & Plan:  Shane Bond was seen today for follow-up.  Diagnoses and all orders for this visit:  Uncontrolled type 2 diabetes mellitus with hyperglycemia, with long-term current use of insulin (HCC) -     Glucose (CBG) -     HgB A1c -     Microalbumin/Creatinine Ratio, Urine -     CMP14+EGFR -     Ambulatory referral to Ophthalmology -     canagliflozin (INVOKANA) 100 MG TABS tablet; Take 1 tablet (100 mg total) by mouth daily before breakfast. -     liraglutide (VICTOZA) 18 MG/3ML SOPN; Inject 1.8 mg into the skin daily. -     Ambulatory referral to Podiatry -     nitrofurantoin, macrocrystal-monohydrate, (MACROBID) 100 MG capsule; Take 1 capsule (100 mg total) by mouth 2 (two) times daily for 7 days. Continue blood sugar control as discussed in office today, low carbohydrate diet, and regular physical exercise as tolerated, 150 minutes per week (30 min each day, 5 days per week, or 50 min 3 days per week). Keep blood sugar logs with fasting goal of 90-130 mg/dl, post prandial (after you eat) less than 180.  For Hypoglycemia: BS <60 and Hyperglycemia BS >400; contact the clinic ASAP. Annual eye exams and foot exams are recommended.   Elevated PSA Follow up with Urology/oncology as instructed  Tobacco dependence Shane Bond was counseled on the dangers of tobacco use, and was advised to quit. Reviewed strategies to maximize success, including removing cigarettes and smoking materials from environment, stress management and support of family/friends as well as pharmacological alternatives including: Wellbutrin, Chantix, Nicotine patch, Nicotine gum or lozenges. Smoking cessation support: smoking cessation hotline: 1-800-QUIT-NOW.  Smoking cessation classes are also available through [facility] and [facility]. Call [phone] or visit our website at [email or web address]   A total of 3 minutes was spent on counseling for smoking cessation and Shane Bond is not ready to quit.    Dyslipidemia, goal LDL below 70 -     ezetimibe (ZETIA) 10 MG tablet; Take 1 tablet (10 mg total) by mouth daily. INSTRUCTIONS: Work on a low fat, heart healthy diet and participate in regular aerobic exercise program by working out at least 150 minutes per week; 5 days a week-30 minutes per day. Avoid red meat/beef/steak,  fried foods. junk foods, sodas, sugary drinks, unhealthy snacking, alcohol and smoking.  Drink at least 80 oz of water per day and monitor your carbohydrate intake daily.    Colon cancer screening -     Ambulatory referral to Gastroenterology  Anemia, unspecified type -     CBC  UTI symptoms -     POCT URINALYSIS DIP (CLINITEK) -     Urine Culture    Patient has been counseled on age-appropriate routine health concerns for screening and prevention. These are reviewed and up-to-date. Referrals have been placed accordingly. Immunizations are up-to-date or declined.    Subjective:   Chief Complaint  Patient presents with   Follow-up    Pt. is here for diabetes f.u. Pt. stated he is having burning sensation and urine odor when he urinates.    HPI Shane Bond 59 y.o. male presents to office today for follow up. He is currently seeing Urology and Oncology for prostate cancer. Endorses Dysuria today along with foul smelling cloudy urine. Onset of symptoms was a few weeks ago.    DM TYPE 2 Not well controlled. He did not pick up the invokana that I prescribed for him in  June. Was unaware that it had been ordered. Will resend today. He endorses medication adherence taking metformin 1000 mg BID, victoza 1.8 mg daily and lantus 50-55 units daily. LDL not at goal. I am starting him on zetia along with his omega 3 today. He has an intolerance to atorvastatin.  Lab Results  Component Value Date   HGBA1C 8.8 (A) 11/04/2019   Lab Results  Component Value Date   HGBA1C 8.3 (H) 08/01/2019   Lab Results  Component Value Date   LDLCALC 121 (H) 08/14/2019    BP  Readings from Last 3 Encounters:  11/04/19 116/75  08/07/19 115/76  08/01/19 130/76    Review of Systems  Constitutional: Negative for fever, malaise/fatigue and weight loss.  HENT: Negative.  Negative for nosebleeds.   Eyes: Negative.  Negative for blurred vision, double vision and photophobia.  Respiratory: Negative.  Negative for cough and shortness of breath.   Cardiovascular: Negative.  Negative for chest pain, palpitations and leg swelling.  Gastrointestinal: Negative.  Negative for heartburn, nausea and vomiting.  Genitourinary: Positive for dysuria. Negative for flank pain, frequency, hematuria and urgency.  Musculoskeletal: Negative.  Negative for myalgias.  Neurological: Negative.  Negative for dizziness, focal weakness, seizures and headaches.  Psychiatric/Behavioral: Negative.  Negative for suicidal ideas.    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   Anemia    Bladder atonia    BPH (benign prostatic hyperplasia)    CAD, NATIVE VESSEL 10/29/2009   Diabetes mellitus type 2, uncontrolled, with complications ([place])    Hyperlipidemia LDL goal <70    Hypertension    Kidney stone    Neurogenic bladder as late effect of cerebrovascular accident (CVA)    OBESITY 10/22/2009   Prostate cancer [facility])     Past Surgical History:  Procedure Laterality Date   BACK SURGERY     CORONARY STENT PLACEMENT     CYSTOSCOPY W/ URETERAL STENT PLACEMENT  02/26/2012   Procedure: CYSTOSCOPY WITH RETROGRADE PYELOGRAM/URETERAL STENT PLACEMENT;  Surgeon: Bernestine Amass, MD;  Location: [facility];  Service: Urology;  Laterality: Left;   CYSTOSCOPY WITH RETROGRADE PYELOGRAM, URETEROSCOPY AND STENT PLACEMENT Left 08/07/2019   Procedure: CYSTOSCOPY WITH LEFT RETROGRADE PYELOGRAM, URETEROSCOPY HOLMIUM LASER AND STENT PLACEMENT;  Surgeon: Ardis Hughs, MD;  Location: [facility];  Service: Urology;  Laterality: Left;   CYSTOSCOPY WITH STENT PLACEMENT Left 06/24/2019     Procedure: CYSTOSCOPY WITH STENT PLACEMENT LEFT URETER WITH LEFT RETROGRADE URETERAL;  Surgeon: Ceasar Mons, MD;  Location: [facility];  Service: Urology;  Laterality: Left;   testicles Right     Family History  Problem Relation Age of Onset   Diabetes Father    Coronary artery disease Other    Heart attack Other    Diabetes Paternal Aunt    Breast cancer Neg Hx    Colon cancer Neg Hx    Pancreatic cancer Neg Hx    Prostate cancer Neg Hx     Social History Reviewed with no changes to be made today.   Outpatient Medications Prior to Visit  Medication Sig Dispense Refill   glucose blood (TRUE METRIX BLOOD GLUCOSE TEST) test strip Use as instructed 100 each 12   insulin glargine (LANTUS SOLOSTAR) 100 UNIT/ML Solostar Pen Inject 50 Units into the skin at bedtime. 48 mL 3   Insulin Pen Needle (TRUEPLUS PEN NEEDLES) 32G X 4 MM MISC USE AS DIRECTED 100 each 12   metFORMIN (GLUCOPHAGE) 1000 MG  tablet TAKE 1 TABLET (1,000 MG TOTAL) BY MOUTH 2 (TWO) TIMES DAILY WITH A MEAL. 60 tablet 2   Misc. Devices MISC Please provide patient with standard sized 36F urinary catheters for self cathing along with lubricant. 50 each prn   omega-3 acid ethyl esters (LOVAZA) 1 g capsule Take 2 capsules (2 g total) by mouth 2 (two) times daily. 360 capsule 2   TRUEplus Lancets 28G MISC 1 each by Does not apply route 4 (four) times daily - after meals and at bedtime. 100 each 12   VICTOZA 18 MG/3ML SOPN INJECT 1.8MG INTO THE SKIN ONCE DAILY 9 mL 0   canagliflozin (INVOKANA) 100 MG TABS tablet Take 1 tablet (100 mg total) by mouth daily before breakfast. (Patient not taking: Reported on 09/03/2019) 30 tablet 3   gemfibrozil (LOPID) 600 MG tablet Take 1 tablet (600 mg total) by mouth 2 (two) times daily before a meal. 180 tablet 1   tamsulosin (FLOMAX) 0.4 MG CAPS capsule Take 1 capsule (0.4 mg total) by mouth daily after supper. (Patient not taking: Reported on 05/14/2019) 30 capsule 0    traMADol (ULTRAM) 50 MG tablet Take 1-2 tablets (50-100 mg total) by mouth every 6 (six) hours as needed for moderate pain. (Patient not taking: Reported on 08/14/2019) 15 tablet 0   No facility-administered medications prior to visit.    Allergies  Allergen Reactions   Lipitor [Atorvastatin] Rash       Objective:    BP 116/75 (BP Location: Left Arm, Patient Position: Sitting, Cuff Size: Normal)    Pulse 92    Temp 97.7 F (36.5 C) (Temporal)    Ht 6' (1.829 m)    Wt 252 lb (114.3 kg)    SpO2 96%    BMI 34.18 kg/m  Wt Readings from Last 3 Encounters:  11/04/19 252 lb (114.3 kg)  09/03/19 245 lb (111.1 kg)  08/07/19 245 lb 2.4 oz (111.2 kg)    Physical Exam Vitals and nursing note reviewed.  Constitutional:      Appearance: He is well-developed.  HENT:     Head: Normocephalic and atraumatic.  Cardiovascular:     Rate and Rhythm: Normal rate and regular rhythm.     Pulses:          Dorsalis pedis pulses are 1+ on the right side and 1+ on the left side.     Heart sounds: Normal heart sounds. No murmur heard.  No friction rub. No gallop.   Pulmonary:     Effort: Pulmonary effort is normal. No tachypnea or respiratory distress.     Breath sounds: Normal breath sounds. No decreased breath sounds, wheezing, rhonchi or rales.  Chest:     Chest wall: No tenderness.  Abdominal:     General: Bowel sounds are normal.     Palpations: Abdomen is soft.  Musculoskeletal:        General: Normal range of motion.     Cervical back: Normal range of motion.  Feet:     Right foot:     Protective Sensation: 10 sites tested. 5 sites sensed.     Skin integrity: Callus and dry skin present. No ulcer, blister, skin breakdown or erythema.     Toenail Condition: Right toenails are abnormally thick and long. Fungal disease present.    Left foot:     Protective Sensation: 10 sites tested. 6 sites sensed.     Skin integrity: Callus and dry skin present. No ulcer, blister, skin breakdown or  erythema.     Toenail Condition: Left toenails are abnormally thick and long. Fungal disease present. Skin:    General: Skin is warm and dry.  Neurological:     Mental Status: He is alert and oriented to person, place, and time.     Coordination: Coordination normal.  Psychiatric:        Behavior: Behavior normal. Behavior is cooperative.        Thought Content: Thought content normal.        Judgment: Judgment normal.          Patient has been counseled extensively about nutrition and exercise as well as the importance of adherence with medications and regular follow-up. The patient was given clear instructions to go to ER or return to medical center if symptoms don't improve, worsen or new problems develop. The patient verbalized understanding.   Follow-up: Return in about 3 months (around 02/03/2020).   Shane Pounds, FNP-BC [facility] and [place], [place]   11/04/2019, 2:00 PM

## 2019-11-05 ENCOUNTER — Other Ambulatory Visit: Payer: Self-pay | Admitting: Nurse Practitioner

## 2019-11-05 LAB — CMP14+EGFR
ALT: 23 IU/L (ref 0–44)
AST: 18 IU/L (ref 0–40)
Albumin/Globulin Ratio: 1.5 (ref 1.2–2.2)
Albumin: 4.6 g/dL (ref 3.8–4.9)
Alkaline Phosphatase: 89 IU/L (ref 44–121)
BUN/Creatinine Ratio: 18 (ref 9–20)
BUN: 20 mg/dL (ref 6–24)
Bilirubin Total: 0.3 mg/dL (ref 0.0–1.2)
CO2: 23 mmol/L (ref 20–29)
Calcium: 10.2 mg/dL (ref 8.7–10.2)
Chloride: 100 mmol/L (ref 96–106)
Creatinine, Ser: 1.11 mg/dL (ref 0.76–1.27)
GFR calc Af Amer: 84 mL/min/{1.73_m2} (ref >59)
GFR calc non Af Amer: 73 mL/min/{1.73_m2} (ref >59)
Globulin, Total: 3.1 g/dL (ref 1.5–4.5)
Glucose: 218 mg/dL — ABNORMAL HIGH (ref 65–99)
Potassium: 4.8 mmol/L (ref 3.5–5.2)
Sodium: 139 mmol/L (ref 134–144)
Total Protein: 7.7 g/dL (ref 6.0–8.5)

## 2019-11-05 LAB — MICROALBUMIN / CREATININE URINE RATIO
Creatinine, Urine: 56.2 mg/dL
Microalb/Creat Ratio: 75 mg/g creat — ABNORMAL HIGH (ref 0–29)
Microalbumin, Urine: 42 ug/mL

## 2019-11-05 LAB — CBC
Hematocrit: 41.3 % (ref 37.5–51.0)
Hemoglobin: 14.9 g/dL (ref 13.0–17.7)
MCH: 33.3 pg — ABNORMAL HIGH (ref 26.6–33.0)
MCHC: 36.1 g/dL — ABNORMAL HIGH (ref 31.5–35.7)
MCV: 92 fL (ref 79–97)
Platelets: 226 10*3/uL (ref 150–450)
RBC: 4.48 x10E6/uL (ref 4.14–5.80)
RDW: 12.6 % (ref 11.6–15.4)
WBC: 6.7 10*3/uL (ref 3.4–10.8)

## 2019-11-05 MED ORDER — LISINOPRIL 5 MG PO TABS
5.0000 mg | ORAL_TABLET | Freq: Every day | ORAL | 3 refills | Status: DC
Start: 2019-11-05 — End: 2019-11-05

## 2019-11-06 MED FILL — LISINOPRIL 5 MG TABLET: 5 | 30 days supply | Qty: 30 | Fill #0

## 2019-11-07 ENCOUNTER — Other Ambulatory Visit: Payer: Self-pay

## 2019-11-07 ENCOUNTER — Other Ambulatory Visit: Payer: Self-pay | Admitting: Nurse Practitioner

## 2019-11-07 DIAGNOSIS — Z794 Long term (current) use of insulin: Secondary | ICD-10-CM

## 2019-11-07 MED ORDER — METFORMIN HCL 1000 MG PO TABS: 1000.0000 mg | ORAL_TABLET | Freq: Two times a day (BID) | ORAL | 2 refills | Status: DC

## 2019-11-07 MED FILL — metFORMIN HCL 1000 MG TABS: 1000 | 30 days supply | Qty: 60 | Fill #0

## 2019-11-07 MED FILL — $LANTUS SOLOSTAR 100 UNITS/: 100 | 90 days supply | Qty: 45 | Fill #1

## 2019-11-07 NOTE — Telephone Encounter (Signed)
Pt. Request medication refill for Metformin. RX sent.

## 2019-11-12 LAB — URINE CULTURE

## 2019-11-14 ENCOUNTER — Other Ambulatory Visit: Payer: Self-pay | Admitting: Nurse Practitioner

## 2019-11-14 MED ORDER — SULFAMETHOXAZOLE-TRIMETHOPRIM 800-160 MG PO TABS: 1.0000 | ORAL_TABLET | Freq: Two times a day (BID) | ORAL | 0 refills | Status: AC

## 2019-11-15 ENCOUNTER — Telehealth: Payer: Self-pay | Admitting: Nurse Practitioner

## 2019-11-15 MED FILL — SULFAMETHOXAZOLE-TMP DS TAB: 800-160 | 7 days supply | Qty: 14 | Fill #0

## 2019-11-15 NOTE — Telephone Encounter (Signed)
Copied from [place] [id number]. Topic: Quick Communication - Lab Results (Clinic Use ONLY) >> Nov 15, 2019 10:07 AM Carilyn Goodpasture, RN wrote: Hulen Skains patient to inform them of  lab results. When patient returns call, triage nurse may disclose results from Mrs. Raul Del r/t the ATB for UTI.

## 2019-12-03 MED FILL — INVOKANA 100 MG TABLET: 100 | 30 days supply | Qty: 30 | Fill #1

## 2019-12-03 MED FILL — EZETIMIBE 10 MG TABS: 10 | 30 days supply | Qty: 30 | Fill #1

## 2019-12-03 MED FILL — GEMFIBROZIL 600 MG TAB: 600 | 30 days supply | Qty: 60 | Fill #3

## 2019-12-09 MED FILL — METFORMIN HCL 1000 MG TABS: 1000 | 30 days supply | Qty: 60 | Fill #1

## 2019-12-11 LAB — FECAL OCCULT BLOOD, IMMUNOCHEMICAL: Fecal Occult Bld: POSITIVE — AB

## 2019-12-13 ENCOUNTER — Encounter: Payer: Self-pay | Admitting: Medical Oncology

## 2019-12-13 NOTE — Progress Notes (Signed)
Spoke with Robin,in  Dr. Puschinky's office for update on ADT. She informed me they are still in the process of completing forms. Patient's sister has completed several forms, some incorrectly, so they had to be redone. The office just received another form today that needs to be completed. I asked if someone in the office could assist the sister so we can get the ADT.She states the information has to come from patient. We started working on this process in July.  I asked if she will please contact me when it is approved.Shirlean Mylar states she will notify me.  Ashlyn, PA updated.

## 2019-12-15 ENCOUNTER — Other Ambulatory Visit: Payer: Self-pay | Admitting: Nurse Practitioner

## 2019-12-15 DIAGNOSIS — R195 Other fecal abnormalities: Secondary | ICD-10-CM

## 2020-01-02 ENCOUNTER — Encounter: Payer: Self-pay | Admitting: Medical Oncology

## 2020-01-02 MED FILL — INVOKANA 100 MG TABLET: 100 | 30 days supply | Qty: 30 | Fill #2

## 2020-01-02 MED FILL — GEMFIBROZIL 600 MG TAB: 600 | 30 days supply | Qty: 60 | Fill #4

## 2020-01-02 MED FILL — EZETIMIBE 10 MG TABS: 10 | 30 days supply | Qty: 30 | Fill #2

## 2020-01-02 NOTE — Progress Notes (Signed)
Spoke with patient and his sister Mardene Celeste for update on ADT. She states she completed forms and after they were faxed in, was told part of the information was missing. She completed another set of forms a coupe of weeks ago and she is waiting to hear back. I voiced my concern in the delay of treatment. She is concerned as well. I will reach out to Dr. Mare Loan office for update. I mentioned a referral to [facility] but she would like to see it is approved in the next few days.

## 2020-01-15 MED FILL — VICTOZA 18 MG/3 ML INJECT P: 18 | 30 days supply | Qty: 9 | Fill #1

## 2020-01-15 MED FILL — METFORMIN HCL 1000 MG TABS: 1000 | 30 days supply | Qty: 60 | Fill #2

## 2020-01-15 MED FILL — LISINOPRIL 5 MG TABLET: 5 | 30 days supply | Qty: 30 | Fill #1

## 2020-01-31 MED FILL — GEMFIBROZIL 600 MG TAB: 600 | 30 days supply | Qty: 60 | Fill #5

## 2020-01-31 MED FILL — ?EZETIMIBE 10MG TABLET: 10MG | 30 days supply | Qty: 30 | Fill #3

## 2020-01-31 MED FILL — INVOKANA 100 MG TABLET: 100 | 30 days supply | Qty: 30 | Fill #3

## 2020-02-03 ENCOUNTER — Ambulatory Visit: Payer: No Typology Code available for payment source | Attending: Nurse Practitioner | Admitting: Nurse Practitioner

## 2020-02-03 ENCOUNTER — Other Ambulatory Visit: Payer: Self-pay

## 2020-02-03 ENCOUNTER — Encounter: Payer: Self-pay | Admitting: Nurse Practitioner

## 2020-02-03 ENCOUNTER — Other Ambulatory Visit: Payer: Self-pay | Admitting: Nurse Practitioner

## 2020-02-03 ENCOUNTER — Ambulatory Visit: Payer: No Typology Code available for payment source

## 2020-02-03 DIAGNOSIS — E1165 Type 2 diabetes mellitus with hyperglycemia: Secondary | ICD-10-CM

## 2020-02-03 DIAGNOSIS — E782 Mixed hyperlipidemia: Secondary | ICD-10-CM

## 2020-02-03 DIAGNOSIS — Z794 Long term (current) use of insulin: Secondary | ICD-10-CM

## 2020-02-03 DIAGNOSIS — N179 Acute kidney failure, unspecified: Secondary | ICD-10-CM

## 2020-02-03 MED ORDER — LANTUS SOLOSTAR 100 UNIT/ML ~~LOC~~ SOPN: 60.0000 [IU] | PEN_INJECTOR | Freq: Every day | SUBCUTANEOUS | 6 refills | Status: DC

## 2020-02-03 MED ORDER — OMEGA-3-ACID ETHYL ESTERS 1 G PO CAPS: 2.0000 g | ORAL_CAPSULE | Freq: Two times a day (BID) | ORAL | 2 refills | Status: DC

## 2020-02-03 MED FILL — !LANTUS SOLOSTAR 100UNITS/M: 100 | 30 days supply | Qty: 15 | Fill #2

## 2020-02-03 MED FILL — OMEGA-3 ETHYL ESTERS 1 GM C: 1 | 30 days supply | Qty: 120 | Fill #0

## 2020-02-03 NOTE — Progress Notes (Signed)
Virtual Visit via Telephone Note Due to national recommendations of social distancing due to [place] 19, telehealth visit is felt to be most appropriate for this patient at this time.  I discussed the limitations, risks, security and privacy concerns of performing an evaluation and management service by telephone and the availability of in person appointments. I also discussed with the patient that there may be a patient responsible charge related to this service. The patient expressed understanding and agreed to proceed.    I connected with Shane Bond on 02/03/20  at  11:10 AM EST  EDT by telephone and verified that I am speaking with the correct person using two identifiers.   Consent I discussed the limitations, risks, security and privacy concerns of performing an evaluation and management service by telephone and the availability of in person appointments. I also discussed with the patient that there may be a patient responsible charge related to this service. The patient expressed understanding and agreed to proceed.   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 He notes average readings 150s. Currently taking lantus 60 units at bedtime, victoza 1.8 mg daily, invokana 100 mg daily and metformin 1000 mg BID. Taking zetia and renal dose ACE. Denies any  symptoms of hypo or hyperglycemia.  LDL not at goal with prescribed zetia 10 mg and lovaza 2 gm BID. Blood pressure is well controlled.  Lab Results  Component Value Date   HGBA1C 8.8 (A) 11/04/2019   Lab Results  Component Value Date   LDLCALC 121 (H) 08/14/2019   BP Readings from Last 3 Encounters:  11/04/19 116/75  08/07/19 115/76  08/01/19 130/76     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   Anemia    Bladder atonia    BPH (benign prostatic hyperplasia)    CAD, NATIVE VESSEL 10/29/2009   Diabetes mellitus type 2, uncontrolled, with complications ([place])    Hyperlipidemia LDL goal <70    Hypertension    Kidney stone    Neurogenic bladder as late effect of cerebrovascular accident (CVA)    OBESITY 10/22/2009   Prostate cancer ([place])     Past Surgical History:  Procedure Laterality Date   BACK SURGERY     CORONARY STENT PLACEMENT     CYSTOSCOPY W/ URETERAL STENT PLACEMENT  02/26/2012   Procedure: CYSTOSCOPY WITH RETROGRADE PYELOGRAM/URETERAL STENT PLACEMENT;  Surgeon: Bernestine Amass, MD;  [facility];  Service: Urology;  Laterality: Left;   CYSTOSCOPY WITH RETROGRADE PYELOGRAM, URETEROSCOPY AND STENT PLACEMENT Left 08/07/2019   Procedure: CYSTOSCOPY WITH LEFT RETROGRADE PYELOGRAM, URETEROSCOPY HOLMIUM LASER AND STENT PLACEMENT;  Surgeon: Ardis Hughs, MD;  [facility];  Service: Urology;  Laterality: Left;   CYSTOSCOPY WITH STENT PLACEMENT Left 06/24/2019   Procedure: CYSTOSCOPY WITH STENT PLACEMENT LEFT URETER WITH LEFT RETROGRADE URETERAL;  Surgeon: Ceasar Mons, MD;  [facility];  Service:  Urology;  Laterality: Left;   testicles Right     Family History  Problem Relation Age of Onset   Diabetes Father    Coronary artery disease Other    Heart attack Other    Diabetes Paternal Aunt    Breast cancer Neg Hx    Colon cancer Neg Hx    Pancreatic cancer Neg Hx    Prostate cancer Neg Hx      Social History   Socioeconomic History   Marital status: Single    Spouse name: Not on file   Number of children: Not on file   Years of education: Not on file   Highest education level: Not on file  Occupational History    Comment: 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  Tobacco Use   Smoking status: Current Every Day Smoker    Types: E-cigarettes   Smokeless tobacco: Former Counsellor Use: Every day  Substance and Sexual Activity   Alcohol use: No   Drug use: No   Sexual activity: Not Currently  Other Topics Concern   Not on file  Social History Narrative   Not on file   Social Determinants of Health   Financial Resource Strain: Not on file  Food Insecurity: Not on file  Transportation Needs: Not on file  Physical Activity: Not on file  Stress: Not on file  Social Connections: Not on file     Observations/Objective: Awake, alert and oriented x 3   Review of Systems  Constitutional: Negative for fever, malaise/fatigue and weight loss.  HENT: Negative.  Negative for nosebleeds.   Eyes: Negative.  Negative for blurred vision, double vision and photophobia.  Respiratory: Negative.  Negative for cough and shortness of breath.   Cardiovascular: Negative.  Negative for chest pain, palpitations and leg swelling.  Gastrointestinal: Negative.  Negative for heartburn, nausea and vomiting.  Musculoskeletal: Negative.  Negative for myalgias.  Neurological: Negative.  Negative for dizziness, focal weakness, seizures and headaches.  Psychiatric/Behavioral: Negative.  Negative for suicidal ideas.    Assessment and Plan: Shane Bond was seen today for follow-up.  Diagnoses and all orders for this visit:  Uncontrolled type 2 diabetes mellitus with hyperglycemia, with long-term current use of insulin (HCC) -     insulin glargine (LANTUS SOLOSTAR) 100 UNIT/ML Solostar Pen; Inject 60 Units into the skin at bedtime. -     omega-3 acid ethyl esters (LOVAZA) 1  g capsule; Take 2 capsules (2 g total) by mouth 2 (two) times daily. Continue blood sugar control as discussed in office today, low carbohydrate diet, and regular physical exercise as tolerated, 150 minutes per week (30 min each day, 5 days per week, or 50 min 3 days per week). Keep blood sugar logs with fasting goal of 90-130 mg/dl, post prandial (after you eat) less than 180.  For Hypoglycemia: BS <60 and Hyperglycemia BS >400; contact the clinic ASAP. Annual eye exams and foot exams are recommended.  Mixed hyperlipidemia -     omega-3 acid ethyl esters (LOVAZA) 1 g capsule; Take 2 capsules (2 g total) by mouth 2 (two) times daily. INSTRUCTIONS: Work on a low fat, heart healthy diet and participate in regular aerobic exercise program by working out at least 150 minutes per week; 5 days a week-30 minutes per day. Avoid red meat/beef/steak,  fried foods. junk foods, sodas, sugary drinks, unhealthy snacking, alcohol and smoking.  Drink at least 80 oz of water per day and monitor your carbohydrate intake  daily.     Follow Up Instructions Return in about 3 months (around 05/03/2020).     I discussed the assessment and treatment plan with the patient. The patient was provided an opportunity to ask questions and all were answered. The patient agreed with the plan and demonstrated an understanding of the instructions.   The patient was advised to call back or seek an in-person evaluation if the symptoms worsen or if the condition fails to improve as anticipated.  I provided 16 minutes of non-face-to-face time during this encounter including median intraservice time, reviewing previous notes, labs, imaging, medications and explaining diagnosis and management.  Gildardo Pounds, FNP-BC

## 2020-02-04 LAB — CMP14+EGFR
ALT: 24 IU/L (ref 0–44)
AST: 23 IU/L (ref 0–40)
Albumin/Globulin Ratio: 1.2 (ref 1.2–2.2)
Albumin: 4 g/dL (ref 3.8–4.9)
Alkaline Phosphatase: 84 IU/L (ref 44–121)
BUN/Creatinine Ratio: 21 — ABNORMAL HIGH (ref 9–20)
BUN: 22 mg/dL (ref 6–24)
Bilirubin Total: 0.2 mg/dL (ref 0.0–1.2)
CO2: 22 mmol/L (ref 20–29)
Calcium: 9.3 mg/dL (ref 8.7–10.2)
Chloride: 102 mmol/L (ref 96–106)
Creatinine, Ser: 1.07 mg/dL (ref 0.76–1.27)
GFR calc Af Amer: 88 mL/min/{1.73_m2} (ref >59)
GFR calc non Af Amer: 76 mL/min/{1.73_m2} (ref >59)
Globulin, Total: 3.3 g/dL (ref 1.5–4.5)
Glucose: 170 mg/dL — ABNORMAL HIGH (ref 65–99)
Potassium: 4.4 mmol/L (ref 3.5–5.2)
Sodium: 136 mmol/L (ref 134–144)
Total Protein: 7.3 g/dL (ref 6.0–8.5)

## 2020-02-04 LAB — CBC
Hematocrit: 37.4 % — ABNORMAL LOW (ref 37.5–51.0)
Hemoglobin: 12.8 g/dL — ABNORMAL LOW (ref 13.0–17.7)
MCH: 32.5 pg (ref 26.6–33.0)
MCHC: 34.2 g/dL (ref 31.5–35.7)
MCV: 95 fL (ref 79–97)
Platelets: 219 10*3/uL (ref 150–450)
RBC: 3.94 x10E6/uL — ABNORMAL LOW (ref 4.14–5.80)
RDW: 12.9 % (ref 11.6–15.4)
WBC: 5.3 10*3/uL (ref 3.4–10.8)

## 2020-02-13 ENCOUNTER — Emergency Department (HOSPITAL_COMMUNITY)
Admission: EM | Admit: 2020-02-13 | Discharge: 2020-02-14 | Discharge status: Against Medical Advice | Payer: No Typology Code available for payment source

## 2020-02-16 ENCOUNTER — Encounter: Payer: Self-pay | Admitting: Nurse Practitioner

## 2020-02-17 ENCOUNTER — Telehealth: Payer: Self-pay | Admitting: Nurse Practitioner

## 2020-02-17 NOTE — Telephone Encounter (Signed)
Pt and sister called back in to follow up on a call back to discuss pt's lab results further. Pt is unclear of results. They are very concerned and would like a call back today if possible.   Please assist further   [phone]

## 2020-02-17 NOTE — Telephone Encounter (Signed)
Patient's siste Mare Ferrari Sister     is calling to request lab results Patient did not understand the results. Please advise  CB- [phone]

## 2020-02-18 NOTE — Telephone Encounter (Signed)
Called pt unable to reach, left VM to call back. 

## 2020-02-19 NOTE — Telephone Encounter (Signed)
PT returning call

## 2020-02-19 NOTE — Telephone Encounter (Signed)
Attempt to call patient's sister back. No answer. CMA attempted to call 2x.

## 2020-02-21 ENCOUNTER — Other Ambulatory Visit: Payer: Self-pay | Admitting: Nurse Practitioner

## 2020-02-21 DIAGNOSIS — Z794 Long term (current) use of insulin: Secondary | ICD-10-CM

## 2020-02-21 DIAGNOSIS — E1165 Type 2 diabetes mellitus with hyperglycemia: Secondary | ICD-10-CM

## 2020-02-21 MED FILL — $LANTUS SOLOSTAR 100 UNITS/: 100 | 30 days supply | Qty: 18 | Fill #0

## 2020-02-21 MED FILL — LISINOPRIL 5 MG TABLET: 5 | 30 days supply | Qty: 30 | Fill #2

## 2020-02-24 ENCOUNTER — Other Ambulatory Visit: Payer: Self-pay | Admitting: Nurse Practitioner

## 2020-02-24 MED FILL — METFORMIN HCL 1000 MG TABS: 1000 | 30 days supply | Qty: 60 | Fill #0

## 2020-02-25 MED FILL — ?EZETIMIBE 10MG TABLET: 10MG | 30 days supply | Qty: 30 | Fill #4

## 2020-02-26 MED FILL — INVOKANA 100 MG TABLET: 100 | 30 days supply | Qty: 30 | Fill #0

## 2020-02-27 ENCOUNTER — Encounter: Payer: Self-pay | Admitting: Medical Oncology

## 2020-02-27 ENCOUNTER — Telehealth: Payer: Self-pay | Admitting: *Deleted

## 2020-02-27 NOTE — Progress Notes (Signed)
Received call from Robin - Dr. Puschinky's office, to inform us he received his Lupon injection. She will fax hi office note form today. Ashlyn, PA notified of the above.

## 2020-02-27 NOTE — Telephone Encounter (Signed)
Called patient to inform of sim appt. For 04/17/20  @ 10 am @ [facility], lvm for a return call

## 2020-03-06 ENCOUNTER — Telehealth: Payer: Self-pay | Admitting: Nurse Practitioner

## 2020-03-06 ENCOUNTER — Other Ambulatory Visit: Payer: Self-pay | Admitting: Nurse Practitioner

## 2020-03-06 DIAGNOSIS — E782 Mixed hyperlipidemia: Secondary | ICD-10-CM

## 2020-03-06 DIAGNOSIS — E1165 Type 2 diabetes mellitus with hyperglycemia: Secondary | ICD-10-CM

## 2020-03-06 DIAGNOSIS — Z794 Long term (current) use of insulin: Secondary | ICD-10-CM

## 2020-03-06 MED FILL — TRUEplus 5-BEVEL PEN NEEDLE: 32G X 4 MM | 25 days supply | Qty: 100 | Fill #0

## 2020-03-06 MED FILL — INVOKANA 100 MG TABLET: 100 | 30 days supply | Qty: 30 | Fill #0

## 2020-03-06 MED FILL — OMEGA-3 ETHYL ESTERS 1 GM C: 1 | 30 days supply | Qty: 120 | Fill #1

## 2020-03-06 NOTE — Telephone Encounter (Addendum)
Patient unsure why gemfibrozil (LOPID) 600 MG tablet was denied and would like a follow up call best #  [place] [place] [employer] Phone:  [phone]  Fax:  [phone]

## 2020-03-12 ENCOUNTER — Other Ambulatory Visit: Payer: Self-pay | Admitting: Nurse Practitioner

## 2020-03-12 NOTE — Telephone Encounter (Signed)
For now I want him to take zetia and lovaza (omega 3) recheck lipids in 4 weeks on these 2 medications only.  Risk of side effects with taking all 3 (gemfibrozil)

## 2020-03-13 NOTE — Telephone Encounter (Signed)
CMA spoke to patient and informed on PCP Advising. Pt. Scheduled 4 week for cholesterol recheck.

## 2020-03-20 ENCOUNTER — Encounter: Payer: Self-pay | Admitting: Physician Assistant

## 2020-03-26 MED FILL — LISINOPRIL 5 MG TABLET: 5 | 30 days supply | Qty: 30 | Fill #3

## 2020-03-26 MED FILL — EZETIMIBE 10 MG TABS: 10 | 30 days supply | Qty: 30 | Fill #5

## 2020-04-03 MED FILL — INVOKANA 100 MG TABLET: 100 | 30 days supply | Qty: 30 | Fill #1

## 2020-04-03 MED FILL — METFORMIN HCL 1000 MG TABS: 1000 | 30 days supply | Qty: 60 | Fill #1

## 2020-04-03 MED FILL — $LANTUS SOLOSTAR 100 UNITS/: 100 | 30 days supply | Qty: 18 | Fill #1

## 2020-04-08 ENCOUNTER — Ambulatory Visit: Payer: Self-pay | Admitting: *Deleted

## 2020-04-08 ENCOUNTER — Ambulatory Visit: Payer: Medicaid Other | Admitting: Physician Assistant

## 2020-04-08 ENCOUNTER — Other Ambulatory Visit: Payer: Self-pay | Admitting: Nurse Practitioner

## 2020-04-08 DIAGNOSIS — E1165 Type 2 diabetes mellitus with hyperglycemia: Secondary | ICD-10-CM

## 2020-04-08 NOTE — Telephone Encounter (Signed)
Pt and his sister, Mardene Celeste called he is most concerned because he has burning with urination and cloudy urination that started "3 days to maybe a week ago"; the pt In and out caths every hour depending on his blood sugar; recommendations made per nurse triage protocol; they would like to schedule an appt on 04/09/20 because he has a lab appt at 1400; he is unable to come to 1030 or 1050 appt; he also has an appt with GI at 1100; there is no availability in the office within timeframe per protocol; will route to office for final disposition; the pt and his sister can be contacted at [phone]. Reason for Disposition . [1] Cloudy urine lasts > 24 hours AND [2] not cleared by increasing fluid intake  Answer Assessment - Initial Assessment Questions 1. SYMPTOMS: "What symptoms are you concerned about?"     buring 2. ONSET:  "When did the symptoms start?"     3 days - 1 week ago 3. FEVER: "Is there a fever?" If Yes, ask: "What is the temperature, how was it measured, and when did it start?"     no 4. ABDOMINAL PAIN: "Is there any abdominal pain?" (e.g., Scale 1-10; or mild, moderate, severe)    no 5. URINE COLOR: "What color is the urine?"  "Is there blood present in the urine?" (e.g., clear, yellow, cloudy, tea-colored, blood streaks, bright red)     cloudy 6. ONSET: "When was the catheter inserted?"    Pt in and out caths 7. OTHER SYMPTOMS: "Do you have any other symptoms?" (e.g., back pain, bad urine odor)     Bad urine odor 8. PREGNANCY: "Is there any chance you are pregnant?" "When was your last menstrual period?"    n/a  Protocols used: URINARY CATHETER SYMPTOMS AND QUESTIONS-A-AH

## 2020-04-09 ENCOUNTER — Other Ambulatory Visit (HOSPITAL_COMMUNITY)
Admission: RE | Admit: 2020-04-09 | Discharge: 2020-04-09 | Disposition: A | Discharge status: Home / Self Care | Payer: Medicaid Other | Source: Ambulatory Visit | Attending: Nurse Practitioner | Admitting: Nurse Practitioner

## 2020-04-09 ENCOUNTER — Other Ambulatory Visit: Payer: Self-pay

## 2020-04-09 ENCOUNTER — Encounter: Payer: Self-pay | Admitting: Physician Assistant

## 2020-04-09 ENCOUNTER — Ambulatory Visit (INDEPENDENT_AMBULATORY_CARE_PROVIDER_SITE_OTHER): Payer: Self-pay | Admitting: Physician Assistant

## 2020-04-09 ENCOUNTER — Ambulatory Visit: Payer: Self-pay | Attending: Nurse Practitioner

## 2020-04-09 VITALS — BP 116/80 | HR 99 | Ht 72.0 in | Wt 242.4 lb

## 2020-04-09 DIAGNOSIS — E1165 Type 2 diabetes mellitus with hyperglycemia: Secondary | ICD-10-CM

## 2020-04-09 DIAGNOSIS — R300 Dysuria: Secondary | ICD-10-CM | POA: Insufficient documentation

## 2020-04-09 DIAGNOSIS — R195 Other fecal abnormalities: Secondary | ICD-10-CM

## 2020-04-09 DIAGNOSIS — Z1211 Encounter for screening for malignant neoplasm of colon: Secondary | ICD-10-CM

## 2020-04-09 LAB — POCT URINALYSIS DIP (CLINITEK)
Bilirubin, UA: NEGATIVE
Glucose, UA: >=1000 mg/dL — AB
Ketones, POC UA: NEGATIVE mg/dL
Nitrite, UA: NEGATIVE
POC PROTEIN,UA: NEGATIVE
Spec Grav, UA: 1.015 (ref 1.010–1.025)
Urobilinogen, UA: 0.2 E.U./dL
pH, UA: 6 (ref 5.0–8.0)

## 2020-04-09 NOTE — Telephone Encounter (Signed)
Patient came into labs for a lab appointment and urinalysis.

## 2020-04-09 NOTE — Progress Notes (Signed)
Subjective:    Patient ID: Shane Bond, male    DOB: 1960-09-07, 60 y.o.   MRN: [id number]  HPI Shane Bond is a 60 year old white male, new to GI today referred by Shane Rankins, NP/community health and wellness for screening colonoscopy in the setting of heme positive stool documented October 2021. Patient has not had prior colonoscopy. Labs done 01/2020 hemoglobin 12.8 hematocrit of 37.4 MCV of 95, LFTs within normal limits He has no specific GI complaints today, no complaints of abdominal pain, or changes in bowel habits.  He tends to be constipated and says that he takes vitamin C a couple of times per week which seems to help with the constipation.  He has occasionally seen small amount of bright red blood on the tissue which she attributes to hemorrhoids.  Denies any upper GI symptoms. Patient has history of coronary artery disease with remote MI, he is maintained on baby aspirin.  History of hypertension, adult onset diabetes mellitus, hyperlipidemia, BPH, he has neurogenic bladder and self catheterizes.  He was fairly recently diagnosed with prostate cancer stage IIIa.  He is started hormone therapy and is to start radiation in a couple of weeks. Family history is negative for colon cancer and polyps as far as they are aware. Echo 2011 showed EF of 45 to 50%  Review of Systems Pertinent positive and negative review of systems were noted in the above HPI section.  All other review of systems was otherwise negative.  Outpatient Encounter Medications as of 04/09/2020  Medication Sig  . Aspirin 81 MG CAPS Take 1 tablet by mouth daily.  . canagliflozin (INVOKANA) 100 MG TABS tablet Take 1 tablet (100 mg total) by mouth daily before breakfast.  . ezetimibe (ZETIA) 10 MG tablet Take 1 tablet (10 mg total) by mouth daily.  [name] glucose blood (TRUE METRIX BLOOD GLUCOSE TEST) test strip Use as instructed  . insulin glargine (LANTUS SOLOSTAR) 100 UNIT/ML Solostar Pen Inject 60 Units into the skin at  bedtime.  . liraglutide (VICTOZA) 18 MG/3ML SOPN Inject 1.8 mg into the skin daily.  [name] lisinopril (ZESTRIL) 5 MG tablet Take 1 tablet (5 mg total) by mouth daily.  . metFORMIN (GLUCOPHAGE) 1000 MG tablet TAKE 1 TABLET (1,000 MG TOTAL) BY MOUTH 2 (TWO) TIMES DAILY WITH A MEAL.  . Misc. Devices MISC Please provide patient with standard sized 90F urinary catheters for self cathing along with lubricant.  [name] omega-3 acid ethyl esters (LOVAZA) 1 g capsule Take 2 capsules (2 g total) by mouth 2 (two) times daily.  . TRUEPLUS 5-BEVEL PEN NEEDLES 32G X 4 MM MISC USE AS DIRECTED  . TRUEplus Lancets 28G MISC 1 each by Does not apply route 4 (four) times daily - after meals and at bedtime.   No facility-administered encounter medications on file as of 04/09/2020.   Allergies  Allergen Reactions  . Lipitor [Atorvastatin] Rash   Patient Active Problem List   Diagnosis Date Noted  . Malignant neoplasm of prostate ([place]) 09/03/2019  . Protein-calorie malnutrition, severe ([place]) 06/24/2019  . Hydronephrosis with renal and ureteral calculus obstruction 06/24/2019  . Thrombocytopenia ([place]) 06/21/2019  . Normocytic anemia 06/21/2019  . Bacteremia due to Klebsiella pneumoniae 06/21/2019  . Lung infiltrate 06/20/2019  . Sepsis due to urinary tract infection ([place]) 06/19/2019  . Hyperkalemia 06/19/2019  . Acute kidney injury ([place]) 06/19/2019  . Type 2 diabetes mellitus with hyperglycemia, with long-term current use of insulin ([place]) 04/13/2017  . Diabetes mellitus without complication ([place])  12/01/2016  . Former smoker 11/02/2015  . Diabetes type 2, uncontrolled ([place]) 06/18/2015  . Hyperlipidemia associated with type 2 diabetes mellitus ([place]) 06/18/2015  . BPH (benign prostatic hyperplasia) 06/18/2015  . Self-catheterizes urinary bladder 06/18/2015  . Hypertension associated with diabetes ([place]) 07/08/2010  . CAD, NATIVE VESSEL 10/29/2009  . AMI, INFERIOR WALL 10/22/2009   Social History   Socioeconomic  History  . Marital status: Single    Spouse name: Not on file  . Number of children: Not on file  . Years of education: Not on file  . 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 Smoking status: Current Every Day Smoker    Types: E-cigarettes  . Smokeless tobacco: Former Network engineer  . Vaping Use: Every day  Substance and Sexual Activity  . Alcohol use: Yes    Comment: occ  . Drug use: No  . Sexual activity: Not Currently  Other Topics Concern  . Not on file  Social History Narrative  . Not on file   Social Determinants of Health   Financial Resource Strain: Not on file  Food Insecurity: Not on file  Transportation Needs: Not on file  Physical Activity: Not on file  Stress: Not on file  Social Connections: Not on file  Intimate Partner Violence: Not on file    [name] family history includes Coronary artery disease in an other family member; Diabetes in his father and paternal aunt; Heart attack in an other family member; Stomach cancer in his maternal uncle.      Objective:    Vitals:   04/09/20 1141  BP: 116/80  Pulse: 99  SpO2: 95%    Physical Exam Well-developed well-nourished  WM  in no acute distress.  Height, MBWGYK,599 BMI 32.8 accompanied by sister  HEENT; nontraumatic normocephalic, EOMI, PE R LA, sclera anicteric. Oropharynx; not examined today Neck; supple, no JVD Cardiovascular; regular rate and rhythm with S1-S2, no murmur rub or gallop Pulmonary; Clear bilaterally Abdomen; soft, obese, nontender nondistended, no palpable mass or hepatosplenomegaly, bowel sounds are active Rectal; not done today, recent Hemoccult positive Skin; benign exam, no jaundice rash or appreciable lesions Extremities; no clubbing cyanosis or edema skin warm and dry Neuro/Psych; alert and oriented x4, grossly nonfocal mood and affect appropriate       Assessment & Plan:   #90  60 year old white male with Hemoccult positive stool, no prior colonoscopy.  Rule out occult neoplasm, colon polyps, versus hemorrhoids. #2 recent diagnosis of prostate cancer stage IIIa, has initiated hormone therapy and is to start a course of radiation later this month. #3 neurogenic bladder/self catheterizes 4.  BPH 5.  Adult onset diabetes mellitus 6.  Hypertension 7.  Coronary artery disease status post remote MI, maintained on baby aspirin #8 hypertension  Plan; patient will be scheduled for Colonoscopy with Dr. Henrene Pastor.  Procedure was discussed in detail with patient including indications risks and benefits and he is agreeable to proceed Patient has completed COVID-19 vaccination.  Lauraine Crespo Genia Harold PA-C 04/09/2020   Cc: Gildardo Pounds, NP

## 2020-04-09 NOTE — Patient Instructions (Signed)
If you are age 59 or older, your body mass index should be between 23-30. Your Body mass index is 32.88 kg/m. If this is out of the aforementioned range listed, please consider follow up with your Primary Care Provider.  If you are age 70 or younger, your body mass index should be between 19-25. Your Body mass index is 32.88 kg/m. If this is out of the aformentioned range listed, please consider follow up with your Primary Care Provider.   You have been scheduled for a colonoscopy. Please follow written instructions given to you at your visit today.  Please pick up your prep supplies at the pharmacy within the next 1-3 days. If you use inhalers (even only as needed), please bring them with you on the day of your procedure.  Thank you for entrusting me with your care and choosing [facility].  Amy Esterwood, PA-C

## 2020-04-09 NOTE — Progress Notes (Signed)
Assessment and plan reviewed 

## 2020-04-10 ENCOUNTER — Other Ambulatory Visit: Payer: Medicaid Other

## 2020-04-10 LAB — HEMOGLOBIN A1C
Est. average glucose Bld gHb Est-mCnc: 169 mg/dL
Hgb A1c MFr Bld: 7.5 % — ABNORMAL HIGH (ref 4.8–5.6)

## 2020-04-13 ENCOUNTER — Ambulatory Visit (HOSPITAL_COMMUNITY)
Admission: EM | Admit: 2020-04-13 | Discharge: 2020-04-13 | Disposition: A | Discharge status: Home / Self Care | Payer: PRIVATE HEALTH INSURANCE | Attending: Internal Medicine | Admitting: Internal Medicine

## 2020-04-13 ENCOUNTER — Telehealth: Payer: Self-pay | Admitting: Nurse Practitioner

## 2020-04-13 ENCOUNTER — Other Ambulatory Visit: Payer: Self-pay

## 2020-04-13 ENCOUNTER — Other Ambulatory Visit (HOSPITAL_COMMUNITY): Payer: Self-pay | Admitting: Family Medicine

## 2020-04-13 DIAGNOSIS — N390 Urinary tract infection, site not specified: Secondary | ICD-10-CM | POA: Diagnosis present

## 2020-04-13 DIAGNOSIS — R310 Gross hematuria: Secondary | ICD-10-CM | POA: Diagnosis not present

## 2020-04-13 DIAGNOSIS — Z789 Other specified health status: Secondary | ICD-10-CM | POA: Diagnosis not present

## 2020-04-13 LAB — URINE CYTOLOGY ANCILLARY ONLY
Bacterial Vaginitis-Urine: NEGATIVE
Candida Urine: NEGATIVE
Chlamydia: NEGATIVE
Comment: NEGATIVE
Comment: NEGATIVE
Comment: NORMAL
Neisseria Gonorrhea: NEGATIVE
Trichomonas: NEGATIVE

## 2020-04-13 LAB — POCT URINALYSIS DIPSTICK, ED / UC
Bilirubin Urine: NEGATIVE
Glucose, UA: >=1000 mg/dL — AB
Ketones, ur: NEGATIVE mg/dL
Nitrite: NEGATIVE
Protein, ur: NEGATIVE mg/dL
Specific Gravity, Urine: 1.015 (ref 1.005–1.030)
Urobilinogen, UA: 0.2 mg/dL (ref 0.0–1.0)
pH: 7 (ref 5.0–8.0)

## 2020-04-13 MED ORDER — CEPHALEXIN 500 MG PO CAPS
500.0000 mg | ORAL_CAPSULE | Freq: Two times a day (BID) | ORAL | 0 refills | Status: DC
Start: 2020-04-13 — End: 2020-04-14

## 2020-04-13 MED FILL — CEPHALEXIN 500 MG CAPSULE: 500 | 7 days supply | Qty: 14 | Fill #0

## 2020-04-13 NOTE — Telephone Encounter (Signed)
Copied from [place] [id number]. Topic: General - Other >> Apr 13, 2020  9:11 AM Leward Quan A wrote: Reason for CRM: Patient sister [name] called in need to get result of urine test please call Ph# [phone]

## 2020-04-13 NOTE — Progress Notes (Signed)
  Radiation Oncology         [phone] ________________________________  Name: Shane Bond MRN: [id number]  Date: 04/17/2020  DOB: [birth year]  SIMULATION AND TREATMENT PLANNING NOTE    ICD-10-CM   1. Malignant neoplasm of prostate ([place])  C61     DIAGNOSIS:  60 y.o. 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  NARRATIVE:  The patient was brought to the [facility].  Identity was confirmed.  All relevant records and images related to the planned course of therapy were reviewed.  The patient freely provided informed written consent to proceed with treatment after reviewing the details related to the planned course of therapy. The consent form was witnessed and verified by the simulation staff.  Then, the patient was set-up in a stable reproducible supine position for radiation therapy.  A vacuum lock pillow device was custom fabricated to position his legs in a reproducible immobilized position.  Then, I performed a urethrogram under sterile conditions to identify the prostatic bed.  CT images were obtained.  Surface markings were placed.  The CT images were loaded into the planning software.  Then the prostate bed target, pelvic lymph node target and avoidance structures including the rectum, bladder, bowel and hips were contoured.  Treatment planning then occurred.  The radiation prescription was entered and confirmed.  A total of one complex treatment devices were fabricated. I have requested : Intensity Modulated Radiotherapy (IMRT) is medically necessary for this case for the following reason:  Rectal sparing.[name]  PLAN:  The patient will receive 45 Gy in 25 fractions of 1.8 Gy, followed by a boost to the prostate to a total dose of 75 Gy with 15 additional fractions of 2 Gy.   ________________________________  Sheral Apley Tammi Klippel, M.D.

## 2020-04-13 NOTE — ED Triage Notes (Signed)
Pt reports seeing blood in his pee. Pt reports he has dysuria and a UTI.

## 2020-04-13 NOTE — ED Provider Notes (Signed)
[facility]    CSN: [id number] Arrival date & time: 04/13/20  1343      History   Chief Complaint Chief Complaint  Patient presents with  . Dysuria    HPI Shane Bond is a 60 y.o. male.   Here today with 5 day history of dysuria with 2 instances of small amount of hematuria. Denies fever, chills, pelvic or abdominal pain, flank pain, N/V/D. Hx of prostate cancer, sepsis secondary to UTI, and home self catheterization. Not trying anything OTC for sxs.      Past Medical History:  Diagnosis Date  . 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 Anemia   . Bladder atonia   . BPH (benign prostatic hyperplasia)   . CAD, NATIVE VESSEL 10/29/2009  . Diabetes mellitus type 2, uncontrolled, with complications ([place])   . Hyperlipidemia LDL goal <70   . Hypertension   . Kidney stone   . Neurogenic bladder as late effect of cerebrovascular accident (CVA)   . OBESITY 10/22/2009  . Prostate cancer [facility])     Patient Active Problem List   Diagnosis Date Noted  . Malignant neoplasm of prostate ([place]) 09/03/2019  . Protein-calorie malnutrition, severe ([place]) 06/24/2019  . Hydronephrosis with renal and ureteral calculus obstruction 06/24/2019  . Thrombocytopenia ([place]) 06/21/2019  . Normocytic anemia 06/21/2019  . Bacteremia due to Klebsiella pneumoniae 06/21/2019  . Lung infiltrate 06/20/2019  . Sepsis due to urinary tract infection ([place]) 06/19/2019  . Hyperkalemia 06/19/2019  . Acute kidney injury ([place]) 06/19/2019  . Type 2 diabetes mellitus with hyperglycemia, with long-term current use of insulin ([place]) 04/13/2017  . Diabetes mellitus without complication ([place]) 38/93/7342  . Former smoker 11/02/2015  . Diabetes type 2, uncontrolled ([place]) 06/18/2015  . Hyperlipidemia associated with type 2 diabetes mellitus ([place]) 06/18/2015  . BPH (benign prostatic hyperplasia) 06/18/2015  . Self-catheterizes urinary bladder 06/18/2015  . Hypertension associated with diabetes ([place]) 07/08/2010  .  CAD, NATIVE VESSEL 10/29/2009  . 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 BACK SURGERY    . CORONARY STENT PLACEMENT    . CYSTOSCOPY W/ URETERAL STENT PLACEMENT  02/26/2012   Procedure: CYSTOSCOPY WITH RETROGRADE PYELOGRAM/URETERAL STENT PLACEMENT;  Surgeon: Bernestine Amass, MD;  Location: [facility];  Service: Urology;  Laterality: Left;  . CYSTOSCOPY WITH RETROGRADE PYELOGRAM, URETEROSCOPY AND STENT PLACEMENT Left 08/07/2019   Procedure: CYSTOSCOPY WITH LEFT RETROGRADE PYELOGRAM, URETEROSCOPY HOLMIUM LASER AND STENT PLACEMENT;  Surgeon: Ardis Hughs, MD;  Location: [facility];  Service: Urology;  Laterality: Left;  . CYSTOSCOPY WITH STENT PLACEMENT Left 06/24/2019   Procedure: CYSTOSCOPY WITH STENT PLACEMENT LEFT URETER WITH LEFT RETROGRADE URETERAL;  Surgeon: Ceasar Mons, MD;  Location: [facility];  Service: Urology;  Laterality: Left;  . testicles Right        Home Medications    Prior to Admission medications   Medication Sig Start Date End Date Taking? Authorizing Provider  cephALEXin (KEFLEX) 500 MG capsule Take 1 capsule (500 mg total) by mouth 2 (two) times daily. 04/13/20  Yes Volney American, PA-C  Aspirin 81 MG CAPS Take 1 tablet by mouth daily.    [provider]  canagliflozin (INVOKANA) 100 MG TABS tablet Take 1 tablet (100 mg total) by mouth daily before breakfast. 11/04/19   Gildardo Pounds, NP  ezetimibe (ZETIA) 10 MG tablet Take 1 tablet (10 mg total) by mouth daily. 11/04/19   Gildardo Pounds, NP  glucose blood (TRUE METRIX BLOOD GLUCOSE TEST) test strip Use as instructed 11/05/18   Gildardo Pounds, NP  insulin glargine (LANTUS SOLOSTAR) 100 UNIT/ML Solostar Pen Inject 60 Units into the skin at bedtime. 02/03/20 05/03/20  Gildardo Pounds, NP  liraglutide (VICTOZA) 18 MG/3ML SOPN Inject 1.8 mg into the skin daily. 11/04/19   Gildardo Pounds, NP  lisinopril (ZESTRIL) 5 MG tablet Take 1 tablet (5 mg total)  by mouth daily. 11/05/19   Gildardo Pounds, NP  metFORMIN (GLUCOPHAGE) 1000 MG tablet TAKE 1 TABLET (1,000 MG TOTAL) BY MOUTH 2 (TWO) TIMES DAILY WITH A MEAL. 02/24/20   Gildardo Pounds, NP  Misc. Devices MISC Please provide patient with standard sized 33F urinary catheters for self cathing along with lubricant. 05/29/18   Gildardo Pounds, NP  omega-3 acid ethyl esters (LOVAZA) 1 g capsule Take 2 capsules (2 g total) by mouth 2 (two) times daily. 02/03/20 05/03/20  Gildardo Pounds, NP  TRUEPLUS 5-BEVEL PEN NEEDLES 32G X 4 MM MISC USE AS DIRECTED 03/06/20   Gildardo Pounds, NP  TRUEplus Lancets 28G MISC 1 each by Does not apply route 4 (four) times daily - after meals and at bedtime. 11/05/18   Gildardo Pounds, NP    Family History Family History  Problem Relation Age of Onset  . Diabetes Father   . Coronary artery disease Other   . Heart attack Other   . Diabetes Paternal Aunt   . Stomach cancer Maternal Uncle   . Breast cancer Neg Hx   . Colon cancer Neg Hx   . Pancreatic cancer Neg Hx   . Prostate cancer Neg Hx   . Esophageal cancer Neg Hx   . Liver disease Neg Hx     Social History Social History   Tobacco Use  . Smoking status: Current Every Day Smoker    Types: E-cigarettes  . Smokeless tobacco: Former Network engineer  . Vaping Use: Every day  Substance Use Topics  . Alcohol use: Yes    Comment: occ  . Drug use: No     Allergies   Lipitor [atorvastatin]   Review of Systems Review of Systems PER HPI   Physical Exam Triage Vital Signs ED Triage Vitals  Enc Vitals Group     BP 04/13/20 1409 128/73     Pulse Rate 04/13/20 1409 96     Resp 04/13/20 1409 18     Temp 04/13/20 1409 98.4 F (36.9 C)     Temp Source 04/13/20 1409 Oral     SpO2 04/13/20 1409 98 %     Weight --      Height --      Head Circumference --      Peak Flow --      Pain Score 04/13/20 1407 0     Pain Loc --      Pain Edu? --      Excl. in [place]? --    No data found.  Updated Vital  Signs BP 128/73 (BP Location: Right Arm)   Pulse 96   Temp 98.4 F (36.9 C) (Oral)   Resp 18   SpO2 98%   Visual Acuity Right Eye Distance:   Left Eye Distance:   Bilateral Distance:    Right Eye Near:   Left Eye Near:    Bilateral Near:     Physical Exam Vitals and nursing note reviewed.  Constitutional:      Appearance: Normal  appearance.  HENT:     Head: Atraumatic.  Eyes:     Extraocular Movements: Extraocular movements intact.     Conjunctiva/sclera: Conjunctivae normal.  Cardiovascular:     Rate and Rhythm: Normal rate and regular rhythm.  Pulmonary:     Effort: Pulmonary effort is normal.     Breath sounds: Normal breath sounds.  Abdominal:     General: Bowel sounds are normal. There is no distension.     Palpations: Abdomen is soft.     Tenderness: There is no abdominal tenderness. There is no right CVA tenderness, left CVA tenderness or guarding.  Musculoskeletal:        General: Normal range of motion.     Cervical back: Normal range of motion and neck supple.  Skin:    General: Skin is warm and dry.  Neurological:     General: No focal deficit present.     Mental Status: He is oriented to person, place, and time.  Psychiatric:        Mood and Affect: Mood normal.        Thought Content: Thought content normal.        Judgment: Judgment normal.      UC Treatments / Results  Labs (all labs ordered are listed, but only abnormal results are displayed) Labs Reviewed  POCT URINALYSIS DIPSTICK, ED / UC - Abnormal; Notable for the following components:      Result Value   Glucose, UA >=1000 (*)    Hgb urine dipstick LARGE (*)    Leukocytes,Ua TRACE (*)    All other components within normal limits  URINE CULTURE    EKG   Radiology No results found.  Procedures Procedures (including critical care time)  Medications Ordered in UC Medications - No data to display  Initial Impression / Assessment and Plan / UC Course  I have reviewed the  triage vital signs and the nursing notes.  Pertinent labs & imaging results that were available during my care of the patient were reviewed by me and considered in my medical decision making (see chart for details).     U/A positive for UTI today, culture pending, exam and vital signs reassuring. Start keflex, await culture, f/u with PCP or Urology for recheck. Return for acutely worsening sxs in meantime.   Final Clinical Impressions(s) / UC Diagnoses   Final diagnoses:  Lower urinary tract infectious disease  Gross hematuria  Self-catheterizes urinary bladder   Discharge Instructions   None    ED Prescriptions    Medication Sig Dispense Auth. Provider   cephALEXin (KEFLEX) 500 MG capsule Take 1 capsule (500 mg total) by mouth 2 (two) times daily. 14 capsule Volney American, [place]     PDMP not reviewed this encounter.   Volney American, [place] 04/13/20 1441

## 2020-04-14 ENCOUNTER — Other Ambulatory Visit: Payer: Self-pay | Admitting: Nurse Practitioner

## 2020-04-14 MED ORDER — SULFAMETHOXAZOLE-TRIMETHOPRIM 800-160 MG PO TABS: 1.0000 | ORAL_TABLET | Freq: Two times a day (BID) | ORAL | 0 refills | Status: DC

## 2020-04-15 LAB — URINE CULTURE: Culture: >=100000 — AB

## 2020-04-15 MED FILL — SULFAMETHOXAZOLE-TMP DS TAB: 800-160 | 10 days supply | Qty: 20 | Fill #0

## 2020-04-16 ENCOUNTER — Telehealth: Payer: Self-pay | Admitting: *Deleted

## 2020-04-16 NOTE — Telephone Encounter (Signed)
CALLED PATIENT'S SISTER- PATRICIA RYAN AND LVM  TO REMIND OF SIM  APPT. FOR 04-17-20- ARRIVAL TIME -9:45 AM, LVM FOR A RETURN CALL

## 2020-04-16 NOTE — Telephone Encounter (Signed)
Spoke to patient and informed on lab results. Patient understood and verified DOB.

## 2020-04-17 ENCOUNTER — Ambulatory Visit
Admission: RE | Admit: 2020-04-17 | Discharge: 2020-04-17 | Disposition: A | Discharge status: Home / Self Care | Payer: Medicaid Other | Source: Ambulatory Visit | Attending: Radiation Oncology | Admitting: Radiation Oncology

## 2020-04-17 ENCOUNTER — Ambulatory Visit: Payer: Medicaid Other | Admitting: Radiation Oncology

## 2020-04-17 ENCOUNTER — Encounter: Payer: Self-pay | Admitting: Medical Oncology

## 2020-04-17 ENCOUNTER — Other Ambulatory Visit: Payer: Self-pay

## 2020-04-17 DIAGNOSIS — C61 Malignant neoplasm of prostate: Secondary | ICD-10-CM | POA: Diagnosis not present

## 2020-04-21 ENCOUNTER — Ambulatory Visit: Payer: Medicaid Other | Admitting: Radiation Oncology

## 2020-04-21 DIAGNOSIS — C61 Malignant neoplasm of prostate: Secondary | ICD-10-CM | POA: Insufficient documentation

## 2020-04-22 MED FILL — LISINOPRIL 5 MG TABLET: 5 | 30 days supply | Qty: 30 | Fill #4

## 2020-04-22 MED FILL — EZETIMIBE 10 MG TABS: 10 | 30 days supply | Qty: 30 | Fill #6

## 2020-04-27 ENCOUNTER — Telehealth: Payer: Self-pay | Admitting: Nurse Practitioner

## 2020-04-27 NOTE — Telephone Encounter (Signed)
   [facility] DOB: 08-09-1960 MRN: [id number]   RIDER WAIVER AND RELEASE OF LIABILITY  For purposes of improving physical access to our facilities, [facility] is pleased to partner with third parties to provide [facility] patients or other authorized individuals the option of convenient, on-demand ground transportation services (the [employer]") through use of the technology service that enables users to request on-demand ground transportation from independent third-party providers.  By opting to use and accept these [employer], I, the undersigned, hereby agree on behalf of myself, and on behalf of any minor child using the [employer] for whom I am the parent or legal guardian, as follows:  1. Government social research officer provided to me are provided by independent third-party transportation providers who are not [employer] or employees and who are unaffiliated with [employer]. 2. [facility] is neither a transportation carrier nor a common or public carrier. 3. [facility] has no control over the quality or safety of the transportation that occurs as a result of the [employer]. 4. [facility] cannot guarantee that any third-party transportation provider will complete any arranged transportation service. 5. [facility] makes no representation, warranty, or guarantee regarding the reliability, timeliness, quality, safety, suitability, or availability of any of the Transport Services or that they will be error free. 6. I fully understand that traveling by vehicle involves risks and dangers of serious bodily injury, including permanent disability, paralysis, and death. I agree, on behalf of myself and on behalf of any minor child using the Transport Services for whom I am the parent or legal guardian, that the entire risk arising out of my use of the [employer] remains solely with me, to the maximum extent permitted under applicable law. 7. The [employer] are provided "as is" and "as available." [facility] disclaims all representations and warranties, express, implied or statutory, not expressly set out in these terms, including the implied warranties of merchantability and fitness for a particular purpose. 8. I hereby waive and release [facility], its agents, employees, officers, directors, representatives, insurers, attorneys, assigns, successors, subsidiaries, and affiliates from any and all past, present, or future claims, demands, liabilities, actions, causes of action, or suits of any kind directly or indirectly arising from acceptance and use of the [employer]. 9. I further waive and release [place] and its affiliates from all present and future liability and responsibility for any injury or death to persons or damages to property caused by or related to the use of the [employer]. 10. I have read this Waiver and Release of Liability, and I understand the terms used in it and their legal significance. This Waiver is freely and voluntarily given with the understanding that my right (as well as the right of any minor child for whom I am the parent or legal guardian using the [employer]) to legal recourse against [place] in connection with the [employer] is knowingly surrendered in return for use of these services.   I attest that I read the consent document to [name], gave Mr. Borquez the opportunity to ask questions and answered the questions asked (if any). I affirm that [name] then provided consent for he's participation in this program.     Darrick Meigs [place]

## 2020-04-29 ENCOUNTER — Ambulatory Visit
Admission: RE | Admit: 2020-04-29 | Discharge: 2020-04-29 | Disposition: A | Discharge status: Home / Self Care | Payer: Medicaid Other | Source: Ambulatory Visit | Attending: Radiation Oncology | Admitting: Radiation Oncology

## 2020-04-29 ENCOUNTER — Encounter: Payer: Self-pay | Admitting: Medical Oncology

## 2020-04-29 ENCOUNTER — Other Ambulatory Visit: Payer: Self-pay

## 2020-04-29 DIAGNOSIS — C61 Malignant neoplasm of prostate: Secondary | ICD-10-CM | POA: Diagnosis not present

## 2020-04-30 ENCOUNTER — Ambulatory Visit
Admission: RE | Admit: 2020-04-30 | Discharge: 2020-04-30 | Disposition: A | Discharge status: Home / Self Care | Payer: Medicaid Other | Source: Ambulatory Visit | Attending: Radiation Oncology | Admitting: Radiation Oncology

## 2020-04-30 DIAGNOSIS — C61 Malignant neoplasm of prostate: Secondary | ICD-10-CM | POA: Diagnosis not present

## 2020-05-01 ENCOUNTER — Ambulatory Visit
Admission: RE | Admit: 2020-05-01 | Discharge: 2020-05-01 | Disposition: A | Discharge status: Home / Self Care | Payer: Medicaid Other | Source: Ambulatory Visit | Attending: Radiation Oncology | Admitting: Radiation Oncology

## 2020-05-01 DIAGNOSIS — C61 Malignant neoplasm of prostate: Secondary | ICD-10-CM | POA: Diagnosis not present

## 2020-05-01 MED FILL — INVOKANA 100 MG TABLET: 100 | 30 days supply | Qty: 30 | Fill #2

## 2020-05-01 MED FILL — !VICTOZA 18MG/3ML INJECT: 18 | 30 days supply | Qty: 9 | Fill #2

## 2020-05-01 MED FILL — $LANTUS SOLOSTAR 100 UNITS/: 100 | 30 days supply | Qty: 18 | Fill #2

## 2020-05-04 ENCOUNTER — Ambulatory Visit: Payer: No Typology Code available for payment source | Admitting: Nurse Practitioner

## 2020-05-04 ENCOUNTER — Ambulatory Visit: Payer: Medicaid Other

## 2020-05-05 ENCOUNTER — Ambulatory Visit: Payer: Medicaid Other

## 2020-05-06 ENCOUNTER — Other Ambulatory Visit: Payer: Self-pay

## 2020-05-06 ENCOUNTER — Ambulatory Visit
Admission: RE | Admit: 2020-05-06 | Discharge: 2020-05-06 | Disposition: A | Discharge status: Home / Self Care | Payer: Medicaid Other | Source: Ambulatory Visit | Attending: Radiation Oncology | Admitting: Radiation Oncology

## 2020-05-06 DIAGNOSIS — C61 Malignant neoplasm of prostate: Secondary | ICD-10-CM | POA: Diagnosis not present

## 2020-05-07 ENCOUNTER — Ambulatory Visit
Admission: RE | Admit: 2020-05-07 | Discharge: 2020-05-07 | Disposition: A | Discharge status: Home / Self Care | Payer: Medicaid Other | Source: Ambulatory Visit | Attending: Radiation Oncology | Admitting: Radiation Oncology

## 2020-05-07 DIAGNOSIS — C61 Malignant neoplasm of prostate: Secondary | ICD-10-CM | POA: Diagnosis not present

## 2020-05-08 ENCOUNTER — Ambulatory Visit
Admission: RE | Admit: 2020-05-08 | Discharge: 2020-05-08 | Disposition: A | Discharge status: Home / Self Care | Payer: Medicaid Other | Source: Ambulatory Visit | Attending: Radiation Oncology | Admitting: Radiation Oncology

## 2020-05-08 ENCOUNTER — Other Ambulatory Visit: Payer: Self-pay

## 2020-05-08 DIAGNOSIS — C61 Malignant neoplasm of prostate: Secondary | ICD-10-CM | POA: Diagnosis not present

## 2020-05-11 ENCOUNTER — Ambulatory Visit (AMBULATORY_SURGERY_CENTER): Payer: PRIVATE HEALTH INSURANCE | Admitting: Internal Medicine

## 2020-05-11 ENCOUNTER — Ambulatory Visit: Payer: Medicaid Other

## 2020-05-11 ENCOUNTER — Other Ambulatory Visit: Payer: Self-pay

## 2020-05-11 ENCOUNTER — Encounter: Payer: Self-pay | Admitting: Internal Medicine

## 2020-05-11 VITALS — BP 112/72 | HR 88 | Temp 98.4°F | Resp 20 | Ht 72.0 in | Wt 242.0 lb

## 2020-05-11 DIAGNOSIS — K626 Ulcer of anus and rectum: Secondary | ICD-10-CM | POA: Diagnosis not present

## 2020-05-11 DIAGNOSIS — K6289 Other specified diseases of anus and rectum: Secondary | ICD-10-CM

## 2020-05-11 DIAGNOSIS — R195 Other fecal abnormalities: Secondary | ICD-10-CM | POA: Diagnosis present

## 2020-05-11 MED ORDER — SODIUM CHLORIDE 0.9 % IV SOLN: 500.0000 mL | Freq: Once | INTRAVENOUS | Status: DC

## 2020-05-11 NOTE — Progress Notes (Signed)
C.W. vital signs. 

## 2020-05-11 NOTE — Op Note (Signed)
[place] Patient Name: Verdis Koval Procedure Date: 05/11/2020 2:43 PM MRN: [id number] Endoscopist: Docia Chuck. Henrene Pastor , MD Age: 60 Referring MD:  Date of Birth: [birth year] Gender: Male Account #: [id number] Procedure:                Colonoscopy with biopsies Indications:              Heme positive stool. History of prostate cancer.                            Undergoing radiation therapy Medicines:                Monitored Anesthesia Care Procedure:                Pre-Anesthesia Assessment:                           - Prior to the procedure, a History and Physical                            was performed, and patient medications and                            allergies were reviewed. The patient's tolerance of                            previous anesthesia was also reviewed. The risks                            and benefits of the procedure and the sedation                            options and risks were discussed with the patient.                            All questions were answered, and informed consent                            was obtained. Prior Anticoagulants: The patient has                            taken no previous anticoagulant or antiplatelet                            agents. ASA Grade Assessment: III - A patient with                            severe systemic disease. After reviewing the risks                            and benefits, the patient was deemed in                            satisfactory condition to undergo the procedure.  After obtaining informed consent, the colonoscope                            was passed under direct vision. Throughout the                            procedure, the patient's blood pressure, pulse, and                            oxygen saturations were monitored continuously. The                            Olympus CF-HQ190L (Serial# [id number]) Colonoscope was                            introduced through  the anus and advanced to the the                            cecum, identified by appendiceal orifice and                            ileocecal valve. The ileocecal valve, appendiceal                            orifice, and rectum were photographed. The quality                            of the bowel preparation was good. The colonoscopy                            was performed without difficulty. The patient                            tolerated the procedure well. The bowel preparation                            used was Miralax via split dose instruction. Scope In: 2:50:37 PM Scope Out: 3:17:44 PM Scope Withdrawal Time: 0 hours 15 minutes 7 seconds  Total Procedure Duration: 0 hours 27 minutes 7 seconds  Findings:                 The colon was quite redundant. The entire examined                            colon appeared normal on direct and retroflexion                            views except for a small linear distal rectal ulcer                            (see images). Biopsies were taken with a cold                            forceps for  histology. Complications:            No immediate complications. Estimated blood loss:                            None. Estimated Blood Loss:     Estimated blood loss: none. Impression:               1. Redundant colon                           2. Distal linear rectal ulcer status post biopsies                           3. History of prostate cancer undergoing radiation                            therapy                           4. Otherwise normal colon on direct and retroflexed                            views. No neoplasia. Recommendation:           - Repeat colonoscopy in 10 years for screening                            purposes.                           - Patient has a contact number available for                            emergencies. The signs and symptoms of potential                            delayed complications were discussed with  the                            patient. Return to normal activities tomorrow.                            Written discharge instructions were provided to the                            patient.                           - Resume previous diet.                           - Continue present medications.                           - Await pathology results. Docia Chuck. Henrene Pastor, MD 05/11/2020 3:24:06 PM This report has been signed electronically.

## 2020-05-11 NOTE — Progress Notes (Signed)
pt tolerated well. VSS. awake and to recovery. Report given to RN.  

## 2020-05-11 NOTE — Progress Notes (Signed)
Called to room to assist during endoscopic procedure.  Patient ID and intended procedure confirmed with present staff. Received instructions for my participation in the procedure from the performing physician.  

## 2020-05-11 NOTE — Patient Instructions (Signed)
YOU HAD AN ENDOSCOPIC PROCEDURE TODAY AT THE [facility]:   Refer to the procedure report that was given to you for any specific questions about what was found during the examination.  If the procedure report does not answer your questions, please call your gastroenterologist to clarify.  If you requested that your care partner not be given the details of your procedure findings, then the procedure report has been included in a sealed envelope for you to review at your convenience later.  YOU SHOULD EXPECT: Some feelings of bloating in the abdomen. Passage of more gas than usual.  Walking can help get rid of the air that was put into your GI tract during the procedure and reduce the bloating. If you had a lower endoscopy (such as a colonoscopy or flexible sigmoidoscopy) you may notice spotting of blood in your stool or on the toilet paper. If you underwent a bowel prep for your procedure, you may not have a normal bowel movement for a few days.  Please Note:  You might notice some irritation and congestion in your nose or some drainage.  This is from the oxygen used during your procedure.  There is no need for concern and it should clear up in a day or so.  SYMPTOMS TO REPORT IMMEDIATELY:   Following lower endoscopy (colonoscopy or flexible sigmoidoscopy):  Excessive amounts of blood in the stool  Significant tenderness or worsening of abdominal pains  Swelling of the abdomen that is new, acute  Fever of 100F or higher   Following upper endoscopy (EGD)  Vomiting of blood or coffee ground material  New chest pain or pain under the shoulder blades  Painful or persistently difficult swallowing  New shortness of breath  Fever of 100F or higher  Black, tarry-looking stools  For urgent or emergent issues, a gastroenterologist can be reached at any hour by calling [phone]. Do not use MyChart messaging for urgent concerns.    DIET:  We do recommend a small meal at first, but  then you may proceed to your regular diet.  Drink plenty of fluids but you should avoid alcoholic beverages for 24 hours.  ACTIVITY:  You should plan to take it easy for the rest of today and you should NOT DRIVE or use heavy machinery until tomorrow (because of the sedation medicines used during the test).    FOLLOW UP: Our staff will call the number listed on your records 48-72 hours following your procedure to check on you and address any questions or concerns that you may have regarding the information given to you following your procedure. If we do not reach you, we will leave a message.  We will attempt to reach you two times.  During this call, we will ask if you have developed any symptoms of COVID 19. If you develop any symptoms (ie: fever, flu-like symptoms, shortness of breath, cough etc.) before then, please call [phone].  If you test positive for Covid 19 in the 2 weeks post procedure, please call and report this information to us.    If any biopsies were taken you will be contacted by phone or by letter within the next 1-3 weeks.  Please call us at [phone] if you have not heard about the biopsies in 3 weeks.    SIGNATURES/CONFIDENTIALITY: You and/or your care partner have signed paperwork which will be entered into your electronic medical record.  These signatures attest to the fact that that the information above on   your After Visit Summary has been reviewed and is understood.  Full responsibility of the confidentiality of this discharge information lies with you and/or your care-partner. 

## 2020-05-11 NOTE — Progress Notes (Signed)
Pt's states no medical or surgical changes since previsit or office visit. 

## 2020-05-12 ENCOUNTER — Ambulatory Visit
Admission: RE | Admit: 2020-05-12 | Discharge: 2020-05-12 | Disposition: A | Discharge status: Home / Self Care | Payer: Medicaid Other | Source: Ambulatory Visit | Attending: Radiation Oncology | Admitting: Radiation Oncology

## 2020-05-12 ENCOUNTER — Other Ambulatory Visit: Payer: Self-pay

## 2020-05-12 DIAGNOSIS — C61 Malignant neoplasm of prostate: Secondary | ICD-10-CM | POA: Diagnosis not present

## 2020-05-12 MED FILL — METFORMIN HCL 1000 MG TABS: 1000 | 30 days supply | Qty: 60 | Fill #2

## 2020-05-13 ENCOUNTER — Telehealth: Payer: Self-pay

## 2020-05-13 ENCOUNTER — Telehealth: Payer: Self-pay | Admitting: Internal Medicine

## 2020-05-13 ENCOUNTER — Ambulatory Visit
Admission: RE | Admit: 2020-05-13 | Discharge: 2020-05-13 | Disposition: A | Discharge status: Home / Self Care | Payer: Medicaid Other | Source: Ambulatory Visit | Attending: Radiation Oncology | Admitting: Radiation Oncology

## 2020-05-13 DIAGNOSIS — C61 Malignant neoplasm of prostate: Secondary | ICD-10-CM | POA: Diagnosis not present

## 2020-05-13 NOTE — Telephone Encounter (Signed)
  Follow up Call-  Call back number 05/11/2020  Post procedure Call Back phone  # [phone]  Permission to leave phone message Yes  Some recent data might be hidden     Patient questions:  Do you have a fever, pain , or abdominal swelling? No. Pain Score  0 *  Have you tolerated food without any problems? Yes.    Have you been able to return to your normal activities? Yes.    Do you have any questions about your discharge instructions: Diet   No. Medications  No. Follow up visit  No.  Do you have questions or concerns about your Care? No.  Actions: * If pain score is 4 or above: No action needed, pain <4.   1. Have you developed a fever since your procedure? No   2.   Have you had an respiratory symptoms (SOB or cough) since your procedure? No   3.   Have you tested positive for COVID 19 since your procedure? No   4.   Have you had any family members/close contacts diagnosed with the COVID 19 since your procedure?  No    If yes to any of these questions please route to Joylene John, RN and Joella Prince, RN

## 2020-05-13 NOTE — Telephone Encounter (Signed)
Pt states when he was on the table today for radiation treatment he saw some blood on the exam table. Pt wanted to know if a bx was taken of the rectal ulcer and if that may have caused the bleeding. Colon report states there was some tissue sampled from the ulcer. Please advise

## 2020-05-14 ENCOUNTER — Ambulatory Visit
Admission: RE | Admit: 2020-05-14 | Discharge: 2020-05-14 | Disposition: A | Discharge status: Home / Self Care | Payer: Medicaid Other | Source: Ambulatory Visit | Attending: Radiation Oncology | Admitting: Radiation Oncology

## 2020-05-14 ENCOUNTER — Other Ambulatory Visit: Payer: Self-pay

## 2020-05-14 DIAGNOSIS — C61 Malignant neoplasm of prostate: Secondary | ICD-10-CM | POA: Diagnosis not present

## 2020-05-14 NOTE — Telephone Encounter (Signed)
Spoke with pt and he is aware and knows Dr. Blanch Media recommendations.

## 2020-05-14 NOTE — Telephone Encounter (Signed)
Bleeding should be minor. May be from tiny biopsies or the actual ulcer itself. Bx pending. Take 2 tablespoons on metamucil daily. Thanks

## 2020-05-15 ENCOUNTER — Ambulatory Visit
Admission: RE | Admit: 2020-05-15 | Discharge: 2020-05-15 | Disposition: A | Discharge status: Home / Self Care | Payer: Medicaid Other | Source: Ambulatory Visit | Attending: Radiation Oncology | Admitting: Radiation Oncology

## 2020-05-15 DIAGNOSIS — C61 Malignant neoplasm of prostate: Secondary | ICD-10-CM | POA: Diagnosis not present

## 2020-05-18 ENCOUNTER — Other Ambulatory Visit: Payer: Self-pay

## 2020-05-18 ENCOUNTER — Ambulatory Visit
Admission: RE | Admit: 2020-05-18 | Discharge: 2020-05-18 | Disposition: A | Discharge status: Home / Self Care | Payer: Medicaid Other | Source: Ambulatory Visit | Attending: Radiation Oncology | Admitting: Radiation Oncology

## 2020-05-18 DIAGNOSIS — C61 Malignant neoplasm of prostate: Secondary | ICD-10-CM | POA: Diagnosis not present

## 2020-05-19 ENCOUNTER — Ambulatory Visit
Admission: RE | Admit: 2020-05-19 | Discharge: 2020-05-19 | Disposition: A | Discharge status: Home / Self Care | Payer: Medicaid Other | Source: Ambulatory Visit | Attending: Radiation Oncology | Admitting: Radiation Oncology

## 2020-05-19 DIAGNOSIS — C61 Malignant neoplasm of prostate: Secondary | ICD-10-CM | POA: Diagnosis not present

## 2020-05-20 ENCOUNTER — Other Ambulatory Visit: Payer: Self-pay

## 2020-05-20 ENCOUNTER — Ambulatory Visit: Admission: RE | Admit: 2020-05-20 | Payer: Medicaid Other | Source: Ambulatory Visit

## 2020-05-20 ENCOUNTER — Encounter: Payer: Self-pay | Admitting: Internal Medicine

## 2020-05-21 ENCOUNTER — Other Ambulatory Visit: Payer: Self-pay

## 2020-05-21 ENCOUNTER — Ambulatory Visit
Admission: RE | Admit: 2020-05-21 | Discharge: 2020-05-21 | Disposition: A | Discharge status: Home / Self Care | Payer: Medicaid Other | Source: Ambulatory Visit | Attending: Radiation Oncology | Admitting: Radiation Oncology

## 2020-05-21 DIAGNOSIS — C61 Malignant neoplasm of prostate: Secondary | ICD-10-CM | POA: Diagnosis not present

## 2020-05-22 ENCOUNTER — Other Ambulatory Visit: Payer: Self-pay

## 2020-05-22 ENCOUNTER — Ambulatory Visit
Admission: RE | Admit: 2020-05-22 | Discharge: 2020-05-22 | Disposition: A | Discharge status: Home / Self Care | Payer: Medicaid Other | Source: Ambulatory Visit | Attending: Radiation Oncology | Admitting: Radiation Oncology

## 2020-05-22 DIAGNOSIS — C61 Malignant neoplasm of prostate: Secondary | ICD-10-CM | POA: Diagnosis not present

## 2020-05-23 ENCOUNTER — Other Ambulatory Visit: Payer: Self-pay

## 2020-05-25 ENCOUNTER — Other Ambulatory Visit: Payer: Self-pay

## 2020-05-25 ENCOUNTER — Ambulatory Visit
Admission: RE | Admit: 2020-05-25 | Discharge: 2020-05-25 | Disposition: A | Discharge status: Home / Self Care | Payer: Medicaid Other | Source: Ambulatory Visit | Attending: Radiation Oncology | Admitting: Radiation Oncology

## 2020-05-25 ENCOUNTER — Encounter (HOSPITAL_COMMUNITY): Payer: Self-pay

## 2020-05-25 ENCOUNTER — Ambulatory Visit (HOSPITAL_COMMUNITY)
Admission: EM | Admit: 2020-05-25 | Discharge: 2020-05-25 | Disposition: A | Discharge status: Home / Self Care | Payer: PRIVATE HEALTH INSURANCE | Attending: Student | Admitting: Student

## 2020-05-25 DIAGNOSIS — N3001 Acute cystitis with hematuria: Secondary | ICD-10-CM | POA: Insufficient documentation

## 2020-05-25 DIAGNOSIS — C61 Malignant neoplasm of prostate: Secondary | ICD-10-CM | POA: Insufficient documentation

## 2020-05-25 DIAGNOSIS — Z87442 Personal history of urinary calculi: Secondary | ICD-10-CM | POA: Diagnosis present

## 2020-05-25 DIAGNOSIS — Z794 Long term (current) use of insulin: Secondary | ICD-10-CM | POA: Diagnosis present

## 2020-05-25 DIAGNOSIS — N3090 Cystitis, unspecified without hematuria: Secondary | ICD-10-CM | POA: Insufficient documentation

## 2020-05-25 DIAGNOSIS — E1169 Type 2 diabetes mellitus with other specified complication: Secondary | ICD-10-CM | POA: Diagnosis present

## 2020-05-25 DIAGNOSIS — Z789 Other specified health status: Secondary | ICD-10-CM | POA: Insufficient documentation

## 2020-05-25 DIAGNOSIS — K458 Other specified abdominal hernia without obstruction or gangrene: Secondary | ICD-10-CM | POA: Diagnosis present

## 2020-05-25 DIAGNOSIS — N319 Neuromuscular dysfunction of bladder, unspecified: Secondary | ICD-10-CM | POA: Insufficient documentation

## 2020-05-25 LAB — URINALYSIS, COMPLETE (UACMP) WITH MICROSCOPIC
Bilirubin Urine: NEGATIVE
Glucose, UA: >=500 mg/dL — AB
Ketones, ur: NEGATIVE mg/dL
Nitrite: NEGATIVE
Protein, ur: >=300 mg/dL — AB
RBC / HPF: >50 RBC/hpf — ABNORMAL HIGH (ref 0–5)
Specific Gravity, Urine: 1.022 (ref 1.005–1.030)
WBC, UA: >50 WBC/hpf — ABNORMAL HIGH (ref 0–5)
pH: 6 (ref 5.0–8.0)

## 2020-05-25 MED ORDER — SULFAMETHOXAZOLE-TRIMETHOPRIM 800-160 MG PO TABS: 1.0000 | ORAL_TABLET | Freq: Two times a day (BID) | ORAL | 0 refills | Status: AC

## 2020-05-25 MED FILL — Lisinopril Tab 5 MG: ORAL | 30 days supply | Qty: 30 | Fill #0 | Status: AC

## 2020-05-25 MED FILL — Ezetimibe Tab 10 MG: ORAL | 30 days supply | Qty: 30 | Fill #0 | Status: AC

## 2020-05-25 NOTE — ED Triage Notes (Signed)
Pt in with c/o burning during urination that has been going on for a few days, but worsened yesterday  Also c/o swelling on penis

## 2020-05-25 NOTE — ED Provider Notes (Signed)
[facility]    CSN: [id number] Arrival date & time: 05/25/20  1622      History   Chief Complaint Chief Complaint  Patient presents with  . Burning during urination    HPI Shane Bond is a 60 y.o. male presenting with dysuria for a few days, getting worse.  History BPH, bladder atonia, diabetes, kidney stones, neurogenic bladder following CVA, prostate cancer, AKI, recurrent cystitis.  Was last treated for UTI 1 month ago by his primary care per patient.  This patient self caths frequently throughout the day and has done this for 20 years.  Notes 3 days of dysuria with urination, getting worse. Some hematuria today. States the tip of his penis around the urethra feels inflamed and uncomfortable but not swollen.  Denies back pain, abdominal pain, fever/chills, penile discharge.  Abdominal hernia for years, unchanged, denies changes in bowel or bladder function, denies constipation or nausea/vomiting.  HPI  Past Medical History:  Diagnosis Date  . 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 Anemia   . Bladder atonia   . BPH (benign prostatic hyperplasia)   . CAD, NATIVE VESSEL 10/29/2009  . Diabetes mellitus type 2, uncontrolled, with complications ([place])   . Hyperlipidemia LDL goal <70   . Hypertension   . Kidney stone   . Neurogenic bladder as late effect of cerebrovascular accident (CVA)   . OBESITY 10/22/2009  . Prostate cancer [facility])     Patient Active Problem List   Diagnosis Date Noted  . Malignant neoplasm of prostate ([place]) 09/03/2019  . Protein-calorie malnutrition, severe ([place]) 06/24/2019  . Hydronephrosis with renal and ureteral calculus obstruction 06/24/2019  . Thrombocytopenia ([place]) 06/21/2019  . Normocytic anemia 06/21/2019  . Bacteremia due to Klebsiella pneumoniae 06/21/2019  . Lung infiltrate 06/20/2019  . Sepsis due to urinary tract infection ([place]) 06/19/2019  . Hyperkalemia 06/19/2019  . Acute kidney injury ([place]) 06/19/2019  . Type 2 diabetes mellitus  with hyperglycemia, with long-term current use of insulin ([place]) 04/13/2017  . Diabetes mellitus without complication ([place]) 20/25/4270  . Former smoker 11/02/2015  . Diabetes type 2, uncontrolled ([place]) 06/18/2015  . Hyperlipidemia associated with type 2 diabetes mellitus ([place]) 06/18/2015  . BPH (benign prostatic hyperplasia) 06/18/2015  . Self-catheterizes urinary bladder 06/18/2015  . Hypertension associated with diabetes ([place]) 07/08/2010  . CAD, NATIVE VESSEL 10/29/2009  . 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 BACK SURGERY    . CORONARY STENT PLACEMENT    . CYSTOSCOPY W/ URETERAL STENT PLACEMENT  02/26/2012   Procedure: CYSTOSCOPY WITH RETROGRADE PYELOGRAM/URETERAL STENT PLACEMENT;  Surgeon: Bernestine Amass, MD;  Location: [facility];  Service: Urology;  Laterality: Left;  . CYSTOSCOPY WITH RETROGRADE PYELOGRAM, URETEROSCOPY AND STENT PLACEMENT Left 08/07/2019   Procedure: CYSTOSCOPY WITH LEFT RETROGRADE PYELOGRAM, URETEROSCOPY HOLMIUM LASER AND STENT PLACEMENT;  Surgeon: Ardis Hughs, MD;  Location: [facility];  Service: Urology;  Laterality: Left;  . CYSTOSCOPY WITH STENT PLACEMENT Left 06/24/2019   Procedure: CYSTOSCOPY WITH STENT PLACEMENT LEFT URETER WITH LEFT RETROGRADE URETERAL;  Surgeon: Ceasar Mons, MD;  Location: [facility];  Service: Urology;  Laterality: Left;  . testicles Right        Home Medications    Prior to Admission medications   Medication Sig Start Date End Date Taking? Authorizing Provider  sulfamethoxazole-trimethoprim (BACTRIM DS) 800-160 MG tablet Take 1 tablet by mouth 2 (two) times daily for 7 days. 05/25/20 06/01/20 Yes Marin Roberts  E, PA-C  Aspirin 81 MG CAPS Take 1 tablet by mouth daily.    [provider]  canagliflozin (INVOKANA) 100 MG TABS tablet TAKE 1 TABLET (100 MG TOTAL) BY MOUTH DAILY BEFORE BREAKFAST. 11/04/19 11/03/20  Gildardo Pounds, NP  canagliflozin (INVOKANA) 100 MG TABS tablet  TAKE 1 TABLET (100 MG TOTAL) BY MOUTH DAILY BEFORE BREAKFAST. Patient not taking: Reported on 09/03/2019 08/14/19 08/13/20  Gildardo Pounds, NP  ezetimibe (ZETIA) 10 MG tablet TAKE 1 TABLET (10 MG TOTAL) BY MOUTH DAILY. 11/04/19 11/03/20  Gildardo Pounds, NP  glucose blood (TRUE METRIX BLOOD GLUCOSE TEST) test strip Use as instructed 11/05/18   Gildardo Pounds, NP  insulin glargine (LANTUS SOLOSTAR) 100 UNIT/ML Solostar Pen Inject 60 Units into the skin at bedtime. 02/03/20 05/03/20  Gildardo Pounds, NP  Insulin Pen Needle 32G X 4 MM MISC USE AS DIRECTED 03/06/20 03/06/21  Gildardo Pounds, NP  liraglutide (VICTOZA) 18 MG/3ML SOPN INJECT 1.8 MG INTO THE SKIN DAILY. 11/04/19 11/03/20  Gildardo Pounds, NP  lisinopril (ZESTRIL) 5 MG tablet TAKE 1 TABLET (5 MG TOTAL) BY MOUTH DAILY. 11/05/19 11/04/20  Gildardo Pounds, NP  metFORMIN (GLUCOPHAGE) 1000 MG tablet TAKE 1 TABLET (1,000 MG TOTAL) BY MOUTH 2 (TWO) TIMES DAILY WITH A MEAL. 02/24/20 02/23/21  Gildardo Pounds, NP  Misc. Devices MISC Please provide patient with standard sized 6F urinary catheters for self cathing along with lubricant. 05/29/18   Gildardo Pounds, NP  omega-3 acid ethyl esters (LOVAZA) 1 g capsule TAKE 2 CAPSULES (2 G TOTAL) BY MOUTH 2 (TWO) TIMES DAILY. 02/03/20 02/02/21  Gildardo Pounds, NP  TRUEplus Lancets 28G MISC 1 each by Does not apply route 4 (four) times daily - after meals and at bedtime. 11/05/18   Gildardo Pounds, NP  gemfibrozil (LOPID) 600 MG tablet Take 1 tablet (600 mg total) by mouth 2 (two) times daily before a meal. 07/20/19 11/04/19  Gildardo Pounds, NP    Family History Family History  Problem Relation Age of Onset  . Diabetes Father   . Coronary artery disease Other   . Heart attack Other   . Diabetes Paternal Aunt   . Stomach cancer Maternal Uncle   . Breast cancer Neg Hx   . Colon cancer Neg Hx   . Pancreatic cancer Neg Hx   . Prostate cancer Neg Hx   . Esophageal cancer Neg Hx   . Liver disease Neg Hx      Social History Social History   Tobacco Use  . Smoking status: Current Every Day Smoker    Types: E-cigarettes  . Smokeless tobacco: Former Network engineer  . Vaping Use: Every day  Substance Use Topics  . Alcohol use: Yes    Comment: occ  . Drug use: No     Allergies   Lipitor [atorvastatin]   Review of Systems Review of Systems  Constitutional: Negative for appetite change, chills, diaphoresis and fever.  Respiratory: Negative for shortness of breath.   Cardiovascular: Negative for chest pain.  Gastrointestinal: Negative for abdominal pain, blood in stool, constipation, diarrhea, nausea and vomiting.  Genitourinary: Positive for dysuria and hematuria. Negative for decreased urine volume, difficulty urinating, enuresis, flank pain, frequency, genital sores, penile discharge, penile pain, penile swelling, scrotal swelling, testicular pain and urgency.  Musculoskeletal: Negative for back pain.  Neurological: Negative for dizziness, weakness and light-headedness.  All other systems reviewed and are negative.    Physical  Exam Triage Vital Signs ED Triage Vitals  Enc Vitals Group     BP 05/25/20 1728 124/70     Pulse Rate 05/25/20 1725 100     Resp 05/25/20 1725 19     Temp 05/25/20 1725 97.8 F (36.6 C)     Temp src --      SpO2 05/25/20 1725 97 %     Weight --      Height --      Head Circumference --      Peak Flow --      Pain Score 05/25/20 1722 8     Pain Loc --      Pain Edu? --      Excl. in [place]? --    No data found.  Updated Vital Signs BP 124/70   Pulse 100   Temp 97.8 F (36.6 C)   Resp 19   SpO2 97%   Visual Acuity Right Eye Distance:   Left Eye Distance:   Bilateral Distance:    Right Eye Near:   Left Eye Near:    Bilateral Near:     Physical Exam Vitals reviewed.  Constitutional:      General: He is not in acute distress.    Appearance: Normal appearance. He is not ill-appearing.  HENT:     Head: Normocephalic and  atraumatic.     Mouth/Throat:     Mouth: Mucous membranes are moist.     Comments: Moist mucous membranes Eyes:     Extraocular Movements: Extraocular movements intact.     Pupils: Pupils are equal, round, and reactive to light.  Cardiovascular:     Rate and Rhythm: Normal rate and regular rhythm.     Heart sounds: Normal heart sounds.  Pulmonary:     Effort: Pulmonary effort is normal.     Breath sounds: Normal breath sounds. No wheezing, rhonchi or rales.  Abdominal:     General: Bowel sounds are normal. There is no distension.     Palpations: Abdomen is soft. There is no mass.     Tenderness: There is abdominal tenderness in the suprapubic area. There is no right CVA tenderness, left CVA tenderness, guarding or rebound. Negative signs include Murphy's sign, Rovsing's sign and McBurney's sign.     Hernia: A hernia is present.       Comments: Hernia, nontender, reducible  Skin:    General: Skin is warm.     Capillary Refill: Capillary refill takes less than 2 seconds.     Comments: Good skin turgor  Neurological:     General: No focal deficit present.     Mental Status: He is alert and oriented to person, place, and time.  Psychiatric:        Mood and Affect: Mood normal.        Behavior: Behavior normal.      UC Treatments / Results  Labs (all labs ordered are listed, but only abnormal results are displayed) Labs Reviewed - No data to display  EKG   Radiology No results found.  Procedures Procedures (including critical care time)  Medications Ordered in UC Medications - No data to display  Initial Impression / Assessment and Plan / UC Course  I have reviewed the triage vital signs and the nursing notes.  Pertinent labs & imaging results that were available during my care of the patient were reviewed by me and considered in my medical decision making (see chart for details).     This  patient is a 60 year old male presenting with recurrent acute cystitis  with hematuria. Today this pt is afebrile nontachycardic nontachypneic, oxygenating well on room air, no wheezes rhonchi or rales. Patient has self catheterized since 1999 following an accident that required back surgery. Reports he catheterizes approximately every hour during the day. Patient also with neurogenic bladder following CVA.    No CVAT, low suspicion for pyelo. Abdominal hernia is reducible and nontender; patient states this has been present for years. He is currently undergoing radiation therapy for prostate cancer. History AKI, last creatinine was in normal range 4 months ago.  History of kidney stones in the past.  UA today with large blood, moderate leuk, rare bacteria.  Culture sent.  This patient was last treated for acute cystitis 1 month ago with Bactrim.  He is requesting to be treated with this again today as this is worked for him last time.  Given normal kidney function, I am amenable to sending this; Bactrim twice daily for 7 days.  Recommended close follow-up with primary care for monitoring and recheck.  For diabetes, continue current regimen.  ED return precautions discussed.  This chart was dictated using voice recognition software, Dragon. Despite the best efforts of this provider to proofread and correct errors, errors may still occur which can change documentation meaning.  Coding this visit as a level 5 visit spent over 45 minutes with this patient and in chart review assessing multiple acute and chronic conditions, some of which pose acute threat to wellbeing and life.  Final Clinical Impressions(s) / UC Diagnoses   Final diagnoses:  History of kidney stones  Recurrent cystitis  Acute cystitis with hematuria  Prostate cancer ([place])  Type 2 diabetes mellitus with other specified complication, with long-term current use of insulin ([place])  Neurogenic bladder  Self-catheterizes urinary bladder  Other specified abdominal hernia without obstruction or gangrene      Discharge Instructions     -Start the antibiotic-Bactrim (sulfamethoxazole-trimethoprim), twice daily for 7 days. -Make sure to drink plenty of water and self cath frequently to keep your urine flowing. -Follow-up with your primary care provider if your symptoms worsen or persist.  Follow-up immediately if you develop new symptoms like back pain, fever/chills, abdominal pain.    ED Prescriptions    Medication Sig Dispense Auth. Provider   sulfamethoxazole-trimethoprim (BACTRIM DS) 800-160 MG tablet Take 1 tablet by mouth 2 (two) times daily for 7 days. 14 tablet Hazel Sams, PA-C     PDMP not reviewed this encounter.   Hazel Sams, PA-C 05/25/20 1820

## 2020-05-25 NOTE — Progress Notes (Signed)
Received patient in the clinic following today's radiation treatment. Patient self catheterizes. Patient reports new onset dysuria and pain when self catheterizing. Patient states, "I often get infections and they normally give me this (Sulfamethozale -TMP by mouth bid for ten days). Collected a urine sample for analysis and culture. Explained this RN will phone her with directions once the results are back. Patient states, "call me soon as you can please it hurts to pee."   60 y.o. 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  The patient will receive 45 Gy in 25 fractions of 1.8 Gy, followed by a boost to the prostate to a total dose of 75 Gy with 15 additional fractions of 2 Gy.  To date patient has receive 27 of 45 intended Gy to his prostate_pelvic region.

## 2020-05-25 NOTE — Discharge Instructions (Addendum)
-  Start the antibiotic-Bactrim (sulfamethoxazole-trimethoprim), twice daily for 7 days. -Make sure to drink plenty of water and self cath frequently to keep your urine flowing. -Follow-up with your primary care provider if your symptoms worsen or persist.  Follow-up immediately if you develop new symptoms like back pain, fever/chills, abdominal pain.

## 2020-05-26 ENCOUNTER — Other Ambulatory Visit: Payer: Self-pay

## 2020-05-26 ENCOUNTER — Ambulatory Visit
Admission: RE | Admit: 2020-05-26 | Discharge: 2020-05-26 | Disposition: A | Discharge status: Home / Self Care | Payer: Medicaid Other | Source: Ambulatory Visit | Attending: Radiation Oncology | Admitting: Radiation Oncology

## 2020-05-26 DIAGNOSIS — C61 Malignant neoplasm of prostate: Secondary | ICD-10-CM | POA: Diagnosis not present

## 2020-05-27 ENCOUNTER — Ambulatory Visit
Admission: RE | Admit: 2020-05-27 | Discharge: 2020-05-27 | Disposition: A | Discharge status: Home / Self Care | Payer: Medicaid Other | Source: Ambulatory Visit | Attending: Radiation Oncology | Admitting: Radiation Oncology

## 2020-05-27 DIAGNOSIS — C61 Malignant neoplasm of prostate: Secondary | ICD-10-CM | POA: Diagnosis not present

## 2020-05-27 LAB — URINE CULTURE: Culture: <10000 — AB

## 2020-05-28 ENCOUNTER — Ambulatory Visit
Admission: RE | Admit: 2020-05-28 | Discharge: 2020-05-28 | Disposition: A | Discharge status: Home / Self Care | Payer: Medicaid Other | Source: Ambulatory Visit | Attending: Radiation Oncology | Admitting: Radiation Oncology

## 2020-05-28 ENCOUNTER — Other Ambulatory Visit: Payer: Self-pay | Admitting: Nurse Practitioner

## 2020-05-28 ENCOUNTER — Other Ambulatory Visit: Payer: Self-pay

## 2020-05-28 DIAGNOSIS — R809 Proteinuria, unspecified: Secondary | ICD-10-CM

## 2020-05-28 DIAGNOSIS — C61 Malignant neoplasm of prostate: Secondary | ICD-10-CM | POA: Diagnosis not present

## 2020-05-28 LAB — URINE CULTURE: Culture: 10000 — AB

## 2020-05-29 ENCOUNTER — Other Ambulatory Visit: Payer: Self-pay | Admitting: Radiation Oncology

## 2020-05-29 ENCOUNTER — Other Ambulatory Visit: Payer: Self-pay

## 2020-05-29 ENCOUNTER — Ambulatory Visit
Admission: RE | Admit: 2020-05-29 | Discharge: 2020-05-29 | Disposition: A | Discharge status: Home / Self Care | Payer: Medicaid Other | Source: Ambulatory Visit | Attending: Radiation Oncology | Admitting: Radiation Oncology

## 2020-05-29 ENCOUNTER — Telehealth: Payer: Self-pay

## 2020-05-29 DIAGNOSIS — C61 Malignant neoplasm of prostate: Secondary | ICD-10-CM | POA: Diagnosis not present

## 2020-05-29 MED ORDER — TAMSULOSIN HCL 0.4 MG PO CAPS
0.4000 mg | ORAL_CAPSULE | Freq: Every day | ORAL | 5 refills | Status: DC
  Filled 2020-05-29: qty 30, 30d supply, fill #0

## 2020-05-29 MED ORDER — LANTUS SOLOSTAR 100 UNIT/ML ~~LOC~~ SOPN
PEN_INJECTOR | SUBCUTANEOUS | 6 refills | Status: DC
  Filled 2020-05-29: qty 18, 30d supply, fill #0
  Filled 2020-06-22: qty 18, 30d supply, fill #1

## 2020-05-29 MED FILL — Insulin Pen Needle 32 G X 4 MM (1/6" or 5/32"): 25 days supply | Qty: 100 | Fill #0 | Status: AC

## 2020-05-29 NOTE — Telephone Encounter (Signed)
Pt informed of lab results and referral to nephrology pt verbalized understanding and voiced no other concerns.

## 2020-05-29 NOTE — Telephone Encounter (Signed)
Attempted to call pt regarding lab results no answer left message. Will try to call pt again later.

## 2020-05-29 NOTE — Telephone Encounter (Signed)
-----   Message from Gildardo Pounds, NP sent at 05/28/2020  6:22 PM EDT ----- Referred to nephrology due to an abnormal amount of protein in his urine. He needs his kidneys evaluated. They will call him to schedule.

## 2020-06-01 ENCOUNTER — Ambulatory Visit
Admission: RE | Admit: 2020-06-01 | Discharge: 2020-06-01 | Disposition: A | Discharge status: Home / Self Care | Payer: Medicaid Other | Source: Ambulatory Visit | Attending: Radiation Oncology | Admitting: Radiation Oncology

## 2020-06-01 ENCOUNTER — Other Ambulatory Visit: Payer: Self-pay

## 2020-06-01 DIAGNOSIS — C61 Malignant neoplasm of prostate: Secondary | ICD-10-CM | POA: Diagnosis not present

## 2020-06-02 ENCOUNTER — Ambulatory Visit
Admission: RE | Admit: 2020-06-02 | Discharge: 2020-06-02 | Disposition: A | Discharge status: Home / Self Care | Payer: Medicaid Other | Source: Ambulatory Visit | Attending: Radiation Oncology | Admitting: Radiation Oncology

## 2020-06-02 DIAGNOSIS — C61 Malignant neoplasm of prostate: Secondary | ICD-10-CM | POA: Diagnosis not present

## 2020-06-03 ENCOUNTER — Other Ambulatory Visit: Payer: Self-pay

## 2020-06-03 ENCOUNTER — Ambulatory Visit: Payer: Medicaid Other

## 2020-06-03 MED FILL — Canagliflozin Tab 100 MG: ORAL | 30 days supply | Qty: 30 | Fill #0 | Status: AC

## 2020-06-04 ENCOUNTER — Ambulatory Visit: Payer: Medicaid Other

## 2020-06-04 ENCOUNTER — Other Ambulatory Visit: Payer: Self-pay

## 2020-06-05 ENCOUNTER — Ambulatory Visit: Payer: Medicaid Other

## 2020-06-08 ENCOUNTER — Ambulatory Visit: Payer: Medicaid Other

## 2020-06-08 ENCOUNTER — Encounter: Payer: Self-pay | Admitting: Medical Oncology

## 2020-06-08 ENCOUNTER — Telehealth: Payer: Self-pay | Admitting: Radiation Oncology

## 2020-06-08 NOTE — Telephone Encounter (Signed)
Mardene Celeste, sister to patient, called wanting to speak w/Dr. Johny Shears nurse about pt needing medicine for burning/pain at treatment site. Nurse not avail. Today, but was able to get covering nurse, Egbert Garibaldi on the phone with her.

## 2020-06-09 ENCOUNTER — Ambulatory Visit: Payer: Self-pay

## 2020-06-09 ENCOUNTER — Encounter: Payer: Self-pay | Admitting: Radiation Oncology

## 2020-06-09 ENCOUNTER — Ambulatory Visit: Payer: Medicaid Other

## 2020-06-09 NOTE — Progress Notes (Signed)
I received a call from the on call nurse re: Mr. Bradsher difficulty with his catheter and related pain from in-and-out cathing.  I reviewed his chart and noted that his family had given the same feedback to [facility] yesterday, who noted "Spoke with Patricia-sister to let her know, Dr. Tammi Klippel is not sure how much this is related to the  radiation. After reviewing his chart, he sees cystitis has been an issue chronically. Since he cannot take AZO and the antibiotic he was given in the ER has not helped, he thinks he needs  to follow up with Dr. Burnell Blanks, his urologist. He  is also putting out a lot glucose and more recently protein which needs to be followed. She voiced understanding and will notify patient"  Today his sister spoke with nursing again - Janan Ridge RN - who also advised that pt see his urologist.  I reiterated this advice and asked the on call nursing to urge patient to call urology now and if he is not satisfied with their advice or symptoms worsen tonight, he can always seek help at our Emergency Dept. On-call nurse stated she will call patient with instructions now.  I explained that Dr. Tammi Klippel does not manage issues with catheters.  Will cc Dr. Tammi Klippel and his nurse to this note.  -----------------------------------  Eppie Gibson, MD

## 2020-06-09 NOTE — Progress Notes (Signed)
Spoke with Patricia-sister to let her know, Dr. Tammi Klippel is not sure how much this is related to the  radiation. After reviewing his chart, he sees cystitis has been an issue chronically. Since he cannot take AZO and the antibiotic he was given in the ER has not helped, he thinks he needs  to follow up with Dr. Burnell Blanks, his urologist. He  is also putting out a lot glucose and more recently protein which needs to be followed. She voiced understanding and will notify patient.

## 2020-06-09 NOTE — Telephone Encounter (Signed)
Patient's sister called and says the patient is complaining of swelling and pain when he catherize himself. She says he has been having this for about 3 weeks now since starting radiation for prostate cancer. I asked is the penis swollen and she put the patient on the phone. He says his prostate is swollen and that when he inserts the 12FR catheter, it's painful going in and painful coming out that he needs a smaller catheter until he has completed radiation. I asked did he speak to the urologist. He says his sister did and went to pick up some catheter samples, which are too big, 14FR. He says the urologist told him if he uses a smaller one then the urine will not flow. He says he's been using the 12 FR and no trouble with the flow of the urine, it's just painful and blood comes from the irritation. He says it's like there is a sore inside the penis. He says he is wanting Zelda to write a prescription for some smaller catheters. He says the oncologist told him that the swelling is normal and that he will have the urge to urinate frequently. He says he catherize about every 30 minutes. I advised I will send this to Zelda for her recommendation, but I'm thinking the urologist office is the best place. His sister was placed back on the phone and I advised her the same. She says it seems like they are going around and around with the urologist about the catheters and his symptoms. She says they need answers. I advised I will send to the office and someone will call back with the recommendation, she verbalized understanding.   Reason for Disposition . Urinary catheter care, questions about  Answer Assessment - Initial Assessment Questions 1. SYMPTOM: "What's the main symptom you're concerned about?" (e.g., discharge from penis, rash, pain, itching, swelling)     Prostate swelling and pain when inserting the catheter 2. LOCATION: "Where is the  located?"     N/A 3. ONSET: "When did pain start?"     3 weeks ago  after the radiation treatments 4. PAIN: "Is there any pain?" If Yes, ask: "How bad is it?"  (Scale 1-10; or mild, moderate, severe)      Using the catheter 8-10 5. URINE: "Any difficulty passing urine?" If Yes, ask: "When was the last time?"       Urine comes out good when using the catheter 6. CAUSE: "What do you think is causing the symptoms?"     Swollen prostate 7. OTHER SYMPTOMS: "Do you have any other symptoms?" (e.g., fever, abdominal pain, blood in urine)     Blood in urine after catherizing  Answer Assessment - Initial Assessment Questions 1. SYMPTOMS: "What symptoms are you concerned about?"     Pain and swelling of prostate 2. ONSET:  "When did the symptoms start?"     3 weeks ago after radiation started 3. FEVER: "Is there a fever?" If Yes, ask: "What is the temperature, how was it measured, and when did it start?"     N/A 4. ABDOMINAL PAIN: "Is there any abdominal pain?" (e.g., Scale 1-10; or mild, moderate, severe)     N/A 5. URINE COLOR: "What color is the urine?"  "Is there blood present in the urine?" (e.g., clear, yellow, cloudy, tea-colored, blood streaks, bright red)     Blood streaks from catheter insertion/removal 6. ONSET: "When was the catheter inserted?"     IN/out catheterization using 12 FR about  every 30 minutes 7. OTHER SYMPTOMS: "Do you have any other symptoms?" (e.g., back pain, bad urine odor)      N/A 8. PREGNANCY: "Is there any chance you are pregnant?" "When was your last menstrual period?"     N/A  Protocols used: URINARY CATHETER SYMPTOMS AND [place], P[facility]IS AND [facility]

## 2020-06-09 NOTE — Progress Notes (Signed)
I spoke with Patricia-sisiter about patient's urinary symptoms. He went to the ER 4/4 and was given an antibiotic for cystitis. He continues to burn and has not gotten any relief. AZO was suggested but he can not take due to taking Aspirin. He would like to know if Dr. Tammi Klippel could call him something to help with burning and discomfort. I will follow up and call her back. She voiced understanding.

## 2020-06-10 ENCOUNTER — Ambulatory Visit: Payer: Medicaid Other

## 2020-06-10 NOTE — Telephone Encounter (Signed)
Will forward to provider  

## 2020-06-11 ENCOUNTER — Ambulatory Visit: Payer: Medicaid Other

## 2020-06-11 NOTE — Telephone Encounter (Signed)
NEEDS TO CONTACT UROLOGIST for this. I do not order foley catheters. Any concerns related to his penis or urine is urology or oncology.

## 2020-06-12 ENCOUNTER — Ambulatory Visit: Payer: Medicaid Other

## 2020-06-12 ENCOUNTER — Telehealth: Payer: Self-pay | Admitting: Radiation Oncology

## 2020-06-12 NOTE — Telephone Encounter (Signed)
Received voicemail message from [place] returning my call. Phoned her back. Spoke with Mardene Celeste then the patient. Patient expresses his desire to return for treatment on Monday, April 25th. Elmyra Ricks, RT on L1 request the patient arrive at 1345 for treatment on Monday. Relayed this to the patient. Instructed patient to contact transportation about his appointment Monday since he has been on a break from treatment. Patient verbalized understanding. Patient self catheterizes. Patient request a smaller in and out cath. Instructed patient follow up with his urologist reference this request. Patient verbalized understanding. Patient explains that it is very hard for him to pass his cath past his prostate when self catheterizing. Patient states, "I thought that flomax would help with this but it doesn't...does [place] have something stronger." Patient reports dysuria and hematuria but confirms "its not as bad as it was when he needed those pills (antibiotics). Patient and his sister, Mardene Celeste, understand this RN will relay these findings to the providers and phone him back with direction.

## 2020-06-12 NOTE — Telephone Encounter (Signed)
Noted while entering PUT encounters that this patient hasn't been treated since 06/02/2020. Phoned patient's cell to inquire. No answer. Left voicemail message requesting return call. Then, phoned patient's sister, [name], to inquire. No answer on her phone either. Left voicemail message requesting return call with update on her brother's status, directions provided during urology follow up and if he intends to return for treatment. Awaiting return call. Providers informed.

## 2020-06-12 NOTE — Telephone Encounter (Signed)
Patient returned my call. Phoned patient back on his cell. No answer. Left voicemail message requesting a return call.

## 2020-06-12 NOTE — Telephone Encounter (Signed)
Returned pt call and left a detailed vm informing pt of provider response and if he has any questions or concerns to give [place] a call

## 2020-06-12 NOTE — Telephone Encounter (Signed)
Attempted to reach patient again. No answer. Left voicemail message.

## 2020-06-15 ENCOUNTER — Other Ambulatory Visit: Payer: Self-pay

## 2020-06-15 ENCOUNTER — Other Ambulatory Visit: Payer: Self-pay | Admitting: Radiation Oncology

## 2020-06-15 ENCOUNTER — Ambulatory Visit: Payer: Medicaid Other

## 2020-06-15 DIAGNOSIS — C61 Malignant neoplasm of prostate: Secondary | ICD-10-CM | POA: Diagnosis not present

## 2020-06-15 MED ORDER — TAMSULOSIN HCL 0.4 MG PO CAPS
0.8000 mg | ORAL_CAPSULE | Freq: Every day | ORAL | 5 refills | Status: DC
  Filled 2020-06-15 – 2020-06-22 (×2): qty 30, 15d supply, fill #0
  Filled 2020-07-07: qty 30, 15d supply, fill #1
  Filled 2020-07-21: qty 30, 15d supply, fill #2
  Filled 2020-08-12: qty 30, 15d supply, fill #3
  Filled 2020-08-31: qty 60, 30d supply, fill #4

## 2020-06-16 ENCOUNTER — Telehealth: Payer: Self-pay

## 2020-06-16 ENCOUNTER — Ambulatory Visit: Payer: Medicaid Other

## 2020-06-16 DIAGNOSIS — C61 Malignant neoplasm of prostate: Secondary | ICD-10-CM | POA: Diagnosis not present

## 2020-06-16 NOTE — Telephone Encounter (Signed)
Copied from [place] [id number]. Topic: General - Other >> Jun 08, 2020  3:04 PM Leward Quan A wrote: Reason for CRM: Patient sister [name] called in to inquire of Archie Patten if patient can get pain medication for swelling and burning caused by radiation treatment and need some pain medication. Say that patient cant take AZO for the burning because he is on a daily aspirin and there will be an interaction. Asking if she can get a call back today please with an answer. Ph# [phone] >> Jun 09, 2020  4:25 PM Tessa Lerner A wrote: Patient's sister has made an additional phone call regarding this  Please contact to further advise when possible

## 2020-06-16 NOTE — Telephone Encounter (Signed)
Returned pt call pt states his symptoms are getting better and he doesn't have any questions or concerns

## 2020-06-17 ENCOUNTER — Ambulatory Visit: Payer: Medicaid Other

## 2020-06-17 ENCOUNTER — Other Ambulatory Visit: Payer: Self-pay | Admitting: Nurse Practitioner

## 2020-06-17 ENCOUNTER — Ambulatory Visit
Admission: RE | Admit: 2020-06-17 | Discharge: 2020-06-17 | Disposition: A | Discharge status: Home / Self Care | Payer: Medicaid Other | Source: Ambulatory Visit | Attending: Radiation Oncology | Admitting: Radiation Oncology

## 2020-06-17 ENCOUNTER — Other Ambulatory Visit: Payer: Self-pay

## 2020-06-17 DIAGNOSIS — C61 Malignant neoplasm of prostate: Secondary | ICD-10-CM | POA: Diagnosis not present

## 2020-06-17 DIAGNOSIS — Z794 Long term (current) use of insulin: Secondary | ICD-10-CM

## 2020-06-17 DIAGNOSIS — E1165 Type 2 diabetes mellitus with hyperglycemia: Secondary | ICD-10-CM

## 2020-06-17 MED ORDER — METFORMIN HCL 1000 MG PO TABS
ORAL_TABLET | Freq: Two times a day (BID) | ORAL | 2 refills | Status: DC
  Filled 2020-06-17: qty 60, 30d supply, fill #0

## 2020-06-18 ENCOUNTER — Ambulatory Visit: Payer: Medicaid Other

## 2020-06-18 ENCOUNTER — Ambulatory Visit: Payer: PRIVATE HEALTH INSURANCE | Attending: Physician Assistant | Admitting: Physician Assistant

## 2020-06-18 ENCOUNTER — Other Ambulatory Visit: Payer: Self-pay

## 2020-06-18 ENCOUNTER — Ambulatory Visit
Admission: RE | Admit: 2020-06-18 | Discharge: 2020-06-18 | Disposition: A | Discharge status: Home / Self Care | Payer: Medicaid Other | Source: Ambulatory Visit | Attending: Radiation Oncology | Admitting: Radiation Oncology

## 2020-06-18 ENCOUNTER — Encounter: Payer: Self-pay | Admitting: Physician Assistant

## 2020-06-18 ENCOUNTER — Other Ambulatory Visit (HOSPITAL_COMMUNITY)
Admission: RE | Admit: 2020-06-18 | Discharge: 2020-06-18 | Disposition: A | Discharge status: Home / Self Care | Payer: PRIVATE HEALTH INSURANCE | Source: Ambulatory Visit | Attending: Physician Assistant | Admitting: Physician Assistant

## 2020-06-18 VITALS — BP 140/84 | HR 106 | Resp 20 | Ht 73.0 in | Wt 252.0 lb

## 2020-06-18 DIAGNOSIS — E782 Mixed hyperlipidemia: Secondary | ICD-10-CM

## 2020-06-18 DIAGNOSIS — R399 Unspecified symptoms and signs involving the genitourinary system: Secondary | ICD-10-CM | POA: Insufficient documentation

## 2020-06-18 DIAGNOSIS — E1165 Type 2 diabetes mellitus with hyperglycemia: Secondary | ICD-10-CM | POA: Diagnosis not present

## 2020-06-18 DIAGNOSIS — C61 Malignant neoplasm of prostate: Secondary | ICD-10-CM | POA: Diagnosis not present

## 2020-06-18 LAB — POCT URINALYSIS DIP (CLINITEK)
Bilirubin, UA: NEGATIVE
Glucose, UA: >=1000 mg/dL — AB
Ketones, POC UA: NEGATIVE mg/dL
Nitrite, UA: NEGATIVE
POC PROTEIN,UA: 100 — AB
Spec Grav, UA: 1.025 (ref 1.010–1.025)
Urobilinogen, UA: 0.2 E.U./dL
pH, UA: 6.5 (ref 5.0–8.0)

## 2020-06-18 LAB — URINE CYTOLOGY ANCILLARY ONLY
Chlamydia: NEGATIVE
Comment: NEGATIVE
Comment: NEGATIVE
Comment: NORMAL
Neisseria Gonorrhea: NEGATIVE
Trichomonas: NEGATIVE

## 2020-06-18 LAB — GLUCOSE, POCT (MANUAL RESULT ENTRY): POC Glucose: 238 mg/dl — AB (ref 70–99)

## 2020-06-18 MED ORDER — METFORMIN HCL 1000 MG PO TABS
ORAL_TABLET | Freq: Two times a day (BID) | ORAL | 2 refills | Status: DC
  Filled 2020-07-21: qty 60, 30d supply, fill #0
  Filled 2020-08-21: qty 60, 30d supply, fill #1
  Filled 2020-09-18: qty 60, 30d supply, fill #2

## 2020-06-18 MED ORDER — OMEGA-3-ACID ETHYL ESTERS 1 G PO CAPS
2.0000 | ORAL_CAPSULE | Freq: Two times a day (BID) | ORAL | 2 refills | Status: DC
  Filled 2020-06-18: qty 120, 30d supply, fill #0
  Filled 2020-07-21 (×3): qty 120, 30d supply, fill #1
  Filled 2020-09-29: qty 120, 30d supply, fill #2
  Filled 2020-10-29: qty 120, 30d supply, fill #3

## 2020-06-18 MED ORDER — SULFAMETHOXAZOLE-TRIMETHOPRIM 800-160 MG PO TABS
ORAL_TABLET | ORAL | 0 refills | Status: DC
  Filled 2020-06-18: qty 34, 24d supply, fill #0

## 2020-06-18 NOTE — Progress Notes (Signed)
Patient ID: Shane Bond, male   DOB: 1961/01/12, 61 y.o.   MRN: [id number]     Shane Bond, is a 60 y.o. male  GDJ:[id number]  QQI:[id number]  DOB - Dec 22, 1960  Subjective:  Chief Complaint and HPI: Shane Bond is a 60 y.o. male here today for recurrent UTI.  He is undergoing radiation treatment for prostate CA.  He has to do in and out caths multiple times a day. He was recently treated for UTI at [facility]. He has 3 weeks left of radiation.    He is out of metformin and needs it RF.  He is compliant with other meds.  Not always compliant with diet.  Sees his PCP in about 2 weeks for next a1c.    ROS:   Constitutional:  No f/c, No night sweats, No unexplained weight loss. EENT:  No vision changes, No blurry vision, No hearing changes. No mouth, throat, or ear problems.  Respiratory: No cough, No SOB Cardiac: No CP, no palpitations GI:  No abd pain, No N/V/D. GU: see above Musculoskeletal: No joint pain Neuro: No headache, no dizziness, no motor weakness.  Skin: No rash Endocrine:  No polydipsia. No polyuria.  Psych: Denies SI/HI  No problems updated.  ALLERGIES: Allergies  Allergen Reactions  . Lipitor [Atorvastatin] Rash    PAST MEDICAL HISTORY: Past Medical History:  Diagnosis Date  . 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 Anemia   . Bladder atonia   . BPH (benign prostatic hyperplasia)   . CAD, NATIVE VESSEL 10/29/2009  . Diabetes mellitus type 2, uncontrolled, with complications ([place])   . Hyperlipidemia LDL goal <70   . Hypertension   . Kidney stone   . Neurogenic bladder as late effect of cerebrovascular accident (CVA)   . OBESITY 10/22/2009  . Prostate cancer [facility])     MEDICATIONS AT HOME: Prior to Admission medications   Medication Sig Start Date End Date Taking? Authorizing Provider  Aspirin 81 MG CAPS Take 1 tablet by mouth daily.   Yes [provider]  canagliflozin (INVOKANA) 100 MG TABS tablet TAKE 1 TABLET (100 MG TOTAL) BY MOUTH DAILY BEFORE  BREAKFAST. 08/14/19 08/13/20 Yes Gildardo Pounds, NP  ezetimibe (ZETIA) 10 MG tablet TAKE 1 TABLET (10 MG TOTAL) BY MOUTH DAILY. 11/04/19 11/03/20 Yes Gildardo Pounds, NP  glucose blood (TRUE METRIX BLOOD GLUCOSE TEST) test strip Use as instructed 11/05/18  Yes Gildardo Pounds, NP  Insulin Pen Needle 32G X 4 MM MISC USE AS DIRECTED 03/06/20 03/06/21 Yes Gildardo Pounds, NP  liraglutide (VICTOZA) 18 MG/3ML SOPN INJECT 1.8 MG INTO THE SKIN DAILY. 11/04/19 11/03/20 Yes Gildardo Pounds, NP  lisinopril (ZESTRIL) 5 MG tablet TAKE 1 TABLET (5 MG TOTAL) BY MOUTH DAILY. 11/05/19 11/04/20 Yes Gildardo Pounds, NP  Misc. Devices MISC Please provide patient with standard sized 82F urinary catheters for self cathing along with lubricant. 05/29/18  Yes Gildardo Pounds, NP  sulfamethoxazole-trimethoprim (BACTRIM DS) 800-160 MG tablet 1 twice daily for 10 days then 1 daily for 2 weeks 06/18/20  Yes Argentina Donovan, PA-C  tamsulosin (FLOMAX) 0.4 MG CAPS capsule Take 2 capsules (0.8 mg total) by mouth daily after supper. 06/15/20  Yes Tyler Pita, MD  TRUEplus Lancets 28G MISC 1 each by Does not apply route 4 (four) times daily - after meals and at bedtime. 11/05/18  Yes Gildardo Pounds, NP  canagliflozin (INVOKANA) 100 MG TABS tablet TAKE 1 TABLET (100 MG TOTAL) BY MOUTH DAILY  BEFORE BREAKFAST. Patient not taking: Reported on 06/18/2020 11/04/19 11/03/20  Gildardo Pounds, NP  insulin glargine (LANTUS SOLOSTAR) 100 UNIT/ML Solostar Pen Inject 60 Units into the skin at bedtime. 02/03/20 05/03/20  Gildardo Pounds, NP  insulin glargine (LANTUS SOLOSTAR) 100 UNIT/ML Solostar Pen nject 60 units into the skin at bedtime Patient not taking: Reported on 06/18/2020 02/03/20 02/02/21  Gildardo Pounds, NP  metFORMIN (GLUCOPHAGE) 1000 MG tablet TAKE 1 TABLET (1,000 MG TOTAL) BY MOUTH 2 (TWO) TIMES DAILY WITH A MEAL. 06/18/20 06/18/21  Argentina Donovan, PA-C  omega-3 acid ethyl esters (LOVAZA) 1 g capsule TAKE 2 CAPSULES (2 G TOTAL) BY  MOUTH 2 (TWO) TIMES DAILY. 06/18/20 06/18/21  Argentina Donovan, PA-C  gemfibrozil (LOPID) 600 MG tablet Take 1 tablet (600 mg total) by mouth 2 (two) times daily before a meal. 07/20/19 11/04/19  Gildardo Pounds, NP     Objective:  EXAM:   Vitals:   06/18/20 1110  BP: 140/84  Pulse: (!) 106  Resp: 20  SpO2: 97%  Weight: 252 lb (114.3 kg)  Height: 6\' 1"  (1.854 m)    General appearance : A&OX3. NAD. Non-toxic-appearing HEENT: Atraumatic and Normocephalic.  PERRLA. EOM intact.   Chest/Lungs:  Breathing-non-labored, Good air entry bilaterally, breath sounds normal without rales, rhonchi, or wheezing  CVS: S1 S2 regular, no murmurs, gallops, rubs  Extremities: Bilateral Lower Ext shows no edema, both legs are warm to touch with = pulse throughout Neurology:  CN II-XII grossly intact, Non focal.   Psych:  TP linear. J/I WNL. Normal speech. Appropriate eye contact and affect.  Skin:  No Rash  Data Review Lab Results  Component Value Date   HGBA1C 7.5 (H) 04/09/2020   HGBA1C 8.8 (A) 11/04/2019   HGBA1C 8.3 (H) 08/01/2019     Assessment & Plan   1. UTI symptoms Will treat/cover for UTI then 1 septra daily until radiation stops for UTI prophylaxis.  Some of his symptoms may be related to inflammation developing from radiation.  Will culture - POCT URINALYSIS DIP (CLINITEK) - Urine Culture - Urine cytology ancillary only - sulfamethoxazole-trimethoprim (BACTRIM DS) 800-160 MG tablet; 1 twice daily for 10 days then 1 daily for 2 weeks  Dispense: 34 tablet; Refill: 0  2. Uncontrolled type 2 diabetes mellitus with hyperglycemia (HCC) Continue lantus and invokana - Glucose (CBG) - metFORMIN (GLUCOPHAGE) 1000 MG tablet; TAKE 1 TABLET (1,000 MG TOTAL) BY MOUTH 2 (TWO) TIMES DAILY WITH A MEAL.  Dispense: 60 tablet; Refill: 2 - omega-3 acid ethyl esters (LOVAZA) 1 g capsule; TAKE 2 CAPSULES (2 G TOTAL) BY MOUTH 2 (TWO) TIMES DAILY.  Dispense: 360 capsule; Refill: 2  3. Mixed  hyperlipidemia - omega-3 acid ethyl esters (LOVAZA) 1 g capsule; TAKE 2 CAPSULES (2 G TOTAL) BY MOUTH 2 (TWO) TIMES DAILY.  Dispense: 360 capsule; Refill: 2   Patient have been counseled extensively about nutrition and exercise  Return for keep 07/02/2020 appt with [name].  The patient was given clear instructions to go to ER or return to medical center if symptoms don't improve, worsen or new problems develop. The patient verbalized understanding. The patient was told to call to get lab results if they haven't heard anything in the next week.     Freeman Caldron, PA-C [facility] [place], [place]   06/18/2020, 11:36 AM

## 2020-06-19 ENCOUNTER — Ambulatory Visit
Admission: RE | Admit: 2020-06-19 | Discharge: 2020-06-19 | Disposition: A | Discharge status: Home / Self Care | Payer: Medicaid Other | Source: Ambulatory Visit | Attending: Radiation Oncology | Admitting: Radiation Oncology

## 2020-06-19 DIAGNOSIS — C61 Malignant neoplasm of prostate: Secondary | ICD-10-CM | POA: Diagnosis not present

## 2020-06-22 ENCOUNTER — Other Ambulatory Visit: Payer: Self-pay

## 2020-06-22 ENCOUNTER — Ambulatory Visit
Admission: RE | Admit: 2020-06-22 | Discharge: 2020-06-22 | Disposition: A | Discharge status: Home / Self Care | Payer: Medicaid Other | Source: Ambulatory Visit | Attending: Radiation Oncology | Admitting: Radiation Oncology

## 2020-06-22 DIAGNOSIS — C61 Malignant neoplasm of prostate: Secondary | ICD-10-CM | POA: Insufficient documentation

## 2020-06-22 LAB — URINE CULTURE

## 2020-06-22 MED FILL — Lisinopril Tab 5 MG: ORAL | 30 days supply | Qty: 30 | Fill #1 | Status: AC

## 2020-06-23 ENCOUNTER — Ambulatory Visit: Payer: Medicaid Other

## 2020-06-23 ENCOUNTER — Ambulatory Visit
Admission: RE | Admit: 2020-06-23 | Discharge: 2020-06-23 | Disposition: A | Discharge status: Home / Self Care | Payer: Medicaid Other | Source: Ambulatory Visit | Attending: Radiation Oncology | Admitting: Radiation Oncology

## 2020-06-23 DIAGNOSIS — C61 Malignant neoplasm of prostate: Secondary | ICD-10-CM | POA: Diagnosis not present

## 2020-06-24 ENCOUNTER — Telehealth: Payer: Self-pay | Admitting: *Deleted

## 2020-06-24 ENCOUNTER — Ambulatory Visit: Payer: Medicaid Other

## 2020-06-24 ENCOUNTER — Ambulatory Visit
Admission: RE | Admit: 2020-06-24 | Discharge: 2020-06-24 | Disposition: A | Discharge status: Home / Self Care | Payer: Medicaid Other | Source: Ambulatory Visit | Attending: Radiation Oncology | Admitting: Radiation Oncology

## 2020-06-24 DIAGNOSIS — C61 Malignant neoplasm of prostate: Secondary | ICD-10-CM | POA: Diagnosis not present

## 2020-06-24 NOTE — Telephone Encounter (Signed)
Pt called to review lab results. Name and DOB verified. Patient aware of results and result note per Freeman Caldron, PA-C  He also adds that he has a bed sore located on his back. He asked what type of dressing he should put on it. States he has been using duck tape with ATB ointment.  Advised to use a nonstick dressing or Telfa that is found in the drug store.  Advised to lay on left and right sides and not on back.  Advised to eat increase protein in diet to promote healing.  Advised to keep f/u appt 5/12 with PCP.   Patient verbalized understanding.

## 2020-06-25 ENCOUNTER — Ambulatory Visit
Admission: RE | Admit: 2020-06-25 | Discharge: 2020-06-25 | Disposition: A | Discharge status: Home / Self Care | Payer: Medicaid Other | Source: Ambulatory Visit | Attending: Radiation Oncology | Admitting: Radiation Oncology

## 2020-06-25 ENCOUNTER — Ambulatory Visit: Payer: Medicaid Other

## 2020-06-25 ENCOUNTER — Other Ambulatory Visit: Payer: Self-pay

## 2020-06-25 DIAGNOSIS — C61 Malignant neoplasm of prostate: Secondary | ICD-10-CM | POA: Diagnosis not present

## 2020-06-26 ENCOUNTER — Ambulatory Visit
Admission: RE | Admit: 2020-06-26 | Discharge: 2020-06-26 | Disposition: A | Discharge status: Home / Self Care | Payer: Medicaid Other | Source: Ambulatory Visit | Attending: Radiation Oncology | Admitting: Radiation Oncology

## 2020-06-26 ENCOUNTER — Ambulatory Visit: Payer: Medicaid Other

## 2020-06-26 DIAGNOSIS — C61 Malignant neoplasm of prostate: Secondary | ICD-10-CM | POA: Diagnosis not present

## 2020-06-29 ENCOUNTER — Ambulatory Visit: Payer: Medicaid Other

## 2020-06-29 ENCOUNTER — Ambulatory Visit
Admission: RE | Admit: 2020-06-29 | Discharge: 2020-06-29 | Disposition: A | Discharge status: Home / Self Care | Payer: Medicaid Other | Source: Ambulatory Visit | Attending: Radiation Oncology | Admitting: Radiation Oncology

## 2020-06-29 ENCOUNTER — Other Ambulatory Visit: Payer: Self-pay

## 2020-06-29 DIAGNOSIS — C61 Malignant neoplasm of prostate: Secondary | ICD-10-CM | POA: Diagnosis not present

## 2020-06-30 ENCOUNTER — Ambulatory Visit
Admission: RE | Admit: 2020-06-30 | Discharge: 2020-06-30 | Disposition: A | Discharge status: Home / Self Care | Payer: Medicaid Other | Source: Ambulatory Visit | Attending: Radiation Oncology | Admitting: Radiation Oncology

## 2020-06-30 ENCOUNTER — Ambulatory Visit: Payer: Medicaid Other

## 2020-06-30 DIAGNOSIS — C61 Malignant neoplasm of prostate: Secondary | ICD-10-CM | POA: Diagnosis not present

## 2020-07-01 ENCOUNTER — Other Ambulatory Visit: Payer: Self-pay

## 2020-07-01 ENCOUNTER — Ambulatory Visit
Admission: RE | Admit: 2020-07-01 | Discharge: 2020-07-01 | Disposition: A | Discharge status: Home / Self Care | Payer: Medicaid Other | Source: Ambulatory Visit | Attending: Radiation Oncology | Admitting: Radiation Oncology

## 2020-07-01 DIAGNOSIS — C61 Malignant neoplasm of prostate: Secondary | ICD-10-CM | POA: Diagnosis not present

## 2020-07-01 NOTE — Progress Notes (Signed)
Received patient in the clinic following radiation therapy. Patient requested this RN assess an area on his bottom. Patient receiving prostate radiation. Stage 2  decubitus noted right buttock closest to his intergluteal cleft. Very small scabbed marks x4 noted just above area of decubitus; appears to be where the patient has been scratching. Applied triple action antibiotic ointment to open area then a telfa pad and secured the telfa with paper tape. Instructed patient to keep his right buttocks clean, dry and to apply triple actions antibiotic ointment regularly.Instructed patient to alternate a pillow under his hips every hour when sitting for prolonged periods watching television. Patient verbalized understanding. Provided patient with a small amount of supplies to care for his wound. Stressed this decubitus is unrelated to radiation therapy and he should follow up with his PCP. Again, patient verbalized understanding.

## 2020-07-01 NOTE — Progress Notes (Signed)
Received call from therapist, Lonn Georgia, on L1. Kayla phoned on behalf of patient to inquire if he can double his Aleve and apply "ice to his prostate." Advised against doubling his aleve. Explained he is unable to apply ice to his prostate because its position relative to his anatomy. Advised against applying ice to his genitals, perineum or buttocks. Lonn Georgia confirmed she would relay this information to the patient.

## 2020-07-02 ENCOUNTER — Other Ambulatory Visit: Payer: Self-pay

## 2020-07-02 ENCOUNTER — Telehealth: Payer: Self-pay

## 2020-07-02 ENCOUNTER — Ambulatory Visit: Payer: PRIVATE HEALTH INSURANCE | Attending: Nurse Practitioner | Admitting: Nurse Practitioner

## 2020-07-02 ENCOUNTER — Other Ambulatory Visit: Payer: Self-pay | Admitting: Nurse Practitioner

## 2020-07-02 ENCOUNTER — Ambulatory Visit: Payer: Medicaid Other

## 2020-07-02 ENCOUNTER — Encounter: Payer: Self-pay | Admitting: Nurse Practitioner

## 2020-07-02 ENCOUNTER — Ambulatory Visit
Admission: RE | Admit: 2020-07-02 | Discharge: 2020-07-02 | Disposition: A | Discharge status: Home / Self Care | Payer: Medicaid Other | Source: Ambulatory Visit | Attending: Radiation Oncology | Admitting: Radiation Oncology

## 2020-07-02 VITALS — BP 113/74 | HR 110 | Resp 16 | Wt 251.0 lb

## 2020-07-02 DIAGNOSIS — L98429 Non-pressure chronic ulcer of back with unspecified severity: Secondary | ICD-10-CM | POA: Diagnosis not present

## 2020-07-02 DIAGNOSIS — C61 Malignant neoplasm of prostate: Secondary | ICD-10-CM | POA: Diagnosis not present

## 2020-07-02 DIAGNOSIS — I152 Hypertension secondary to endocrine disorders: Secondary | ICD-10-CM | POA: Diagnosis not present

## 2020-07-02 DIAGNOSIS — E1169 Type 2 diabetes mellitus with other specified complication: Secondary | ICD-10-CM | POA: Diagnosis not present

## 2020-07-02 DIAGNOSIS — D649 Anemia, unspecified: Secondary | ICD-10-CM | POA: Diagnosis not present

## 2020-07-02 DIAGNOSIS — E1159 Type 2 diabetes mellitus with other circulatory complications: Secondary | ICD-10-CM

## 2020-07-02 DIAGNOSIS — E1165 Type 2 diabetes mellitus with hyperglycemia: Secondary | ICD-10-CM

## 2020-07-02 DIAGNOSIS — E785 Hyperlipidemia, unspecified: Secondary | ICD-10-CM

## 2020-07-02 LAB — POCT GLYCOSYLATED HEMOGLOBIN (HGB A1C): HbA1c, POC (controlled diabetic range): 7.3 % — AB (ref 0.0–7.0)

## 2020-07-02 LAB — GLUCOSE, POCT (MANUAL RESULT ENTRY): POC Glucose: 248 mg/dl — AB (ref 70–99)

## 2020-07-02 MED ORDER — EZETIMIBE 10 MG PO TABS
ORAL_TABLET | Freq: Every day | ORAL | 3 refills | Status: DC
  Filled 2020-07-02: qty 90, 90d supply, fill #0
  Filled 2020-09-29: qty 90, 90d supply, fill #1
  Filled 2021-01-01: qty 90, 90d supply, fill #2
  Filled 2021-04-01: qty 90, 90d supply, fill #0

## 2020-07-02 MED ORDER — DAPAGLIFLOZIN PROPANEDIOL 10 MG PO TABS
10.0000 mg | ORAL_TABLET | Freq: Every day | ORAL | 0 refills | Status: DC
  Filled 2020-07-02: qty 30, 30d supply, fill #0
  Filled 2020-08-03: qty 30, 30d supply, fill #1
  Filled 2020-08-31: qty 30, 30d supply, fill #2

## 2020-07-02 MED ORDER — CANAGLIFLOZIN 300 MG PO TABS
300.0000 mg | ORAL_TABLET | Freq: Every day | ORAL | 1 refills | Status: DC
  Filled 2020-07-02: qty 90, 90d supply, fill #0

## 2020-07-02 MED ORDER — CAREX COCCYX CUSHION MISC: 99 refills | Status: AC

## 2020-07-02 NOTE — Progress Notes (Signed)
Assessment & Plan:  Arzell was seen today for diabetes, hypertension, hyperlipidemia and follow-up.  Diagnoses and all orders for this visit:  Uncontrolled type 2 diabetes mellitus with hyperglycemia (HCC) -     Glucose (CBG) -     HgB A1c -     CMP14+EGFR -     Ambulatory referral to Ophthalmology -     canagliflozin (INVOKANA) 300 MG TABS tablet; Take 1 tablet (300 mg total) by mouth daily before breakfast. Continue blood sugar control as discussed in office today, low carbohydrate diet, and regular physical exercise as tolerated, 150 minutes per week (30 min each day, 5 days per week, or 50 min 3 days per week). Keep blood sugar logs with fasting goal of 90-130 mg/dl, post prandial (after you eat) less than 180.  For Hypoglycemia: BS <60 and Hyperglycemia BS >400; contact the clinic ASAP. Annual eye exams and foot exams are recommended.   Hypertension associated with diabetes ([place]) -     CMP14+EGFR Continue all antihypertensives as prescribed.  Remember to bring in your blood pressure log with you for your follow up appointment.  DASH/Mediterranean Diets are healthier choices for HTN.   Hyperlipidemia associated with type 2 diabetes mellitus (HCC) -     Lipid panel -     ezetimibe (ZETIA) 10 MG tablet; TAKE 1 TABLET (10 MG TOTAL) BY MOUTH DAILY. INSTRUCTIONS: Work on a low fat, heart healthy diet and participate in regular aerobic exercise program by working out at least 150 minutes per week; 5 days a week-30 minutes per day. Avoid red meat/beef/steak,  fried foods. junk foods, sodas, sugary drinks, unhealthy snacking, alcohol and smoking.  Drink at least 80 oz of water per day and monitor your carbohydrate intake daily.    Anemia, unspecified type -     CBC  Stage 1 skin ulcer of sacral region ([place]) Continue antibiotic ointment. Advised to purchase doughnut. Script sent for carex coxxyx cushion    Patient has been counseled on age-appropriate routine health concerns for  screening and prevention. These are reviewed and up-to-date. Referrals have been placed accordingly. Immunizations are up-to-date or declined.    Subjective:   Chief Complaint  Patient presents with  . Diabetes  . Hypertension  . Hyperlipidemia  . Follow-up   HPI SRIYAN CUTTING 60 y.o. male presents to office today for follow up. 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  Continues radiation treatment for prostate CA. Taking flomax 0.8 mg daily.  He is accompanied by his sister today.    DM 2 Improved but not well optimized. Will increase invokana to 100 to 300 mg daily, He will continue on lantus 60 at bedtime, victoza 1.8 mg daily and metformin 1000 mg BID. LDL is not at goal with zetia 10 mg daily and lovaza 2 capsules BID. He is also taking renal dose ACE.  Lab Results  Component Value Date   HGBA1C 7.3 (A) 07/02/2020   Lab Results  Component Value Date   HGBA1C 7.5 (H) 04/09/2020   Lab Results  Component Value Date   LDLCALC 121 (H) 08/14/2019     Essential Hypertension Well controlled. Taking renal dose ACE lisinopril 5 mg daily. Denies chest pain, shortness of breath,  palpitations, lightheadedness, dizziness, headaches or BLE edema.  BP Readings from Last 3 Encounters:  07/02/20 113/74  06/18/20 140/84  05/25/20 124/70   Wound Check: Patient has a stage 1 pressure ulcer wound which is located on the bilateral inner buttocks. Greater on Right. Current symptoms: pain: mild, moderate/erythema: mild. Symptoms began several weeks ago. Interventions to date: antibiotic ointment and 4x4 gauze application.   Review of Systems   Constitutional: Negative for fever, malaise/fatigue and weight loss.  HENT: Negative.  Negative for nosebleeds.   Eyes: Negative.  Negative for blurred vision, double vision and photophobia.  Respiratory: Negative.  Negative for cough and shortness of breath.   Cardiovascular: Negative.  Negative for chest pain, palpitations and leg swelling.  Gastrointestinal: Negative.  Negative for heartburn, nausea and vomiting.  Genitourinary: Positive for frequency. Negative for dysuria, flank pain, hematuria and urgency.  Musculoskeletal: Positive for back pain. Negative for myalgias.  Skin:       SEE HPI  Neurological: Negative.  Negative for dizziness, focal weakness, seizures and headaches.  Psychiatric/Behavioral: Positive for memory loss. Negative for suicidal ideas.    Past Medical History:  Diagnosis Date  . 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 Anemia   . Bladder atonia   . BPH (benign prostatic hyperplasia)   . CAD, NATIVE VESSEL 10/29/2009  . Diabetes mellitus type 2, uncontrolled, with complications ([place])   . Hyperlipidemia LDL goal <70   . Hypertension   . Kidney stone   . Neurogenic bladder as late effect of cerebrovascular accident (CVA)   . OBESITY 10/22/2009  . Prostate cancer [facility])     Past Surgical History:  Procedure Laterality Date  . BACK SURGERY    . CORONARY STENT PLACEMENT    . CYSTOSCOPY W/ URETERAL STENT PLACEMENT  02/26/2012   Procedure: CYSTOSCOPY WITH RETROGRADE PYELOGRAM/URETERAL STENT PLACEMENT;  Surgeon: Bernestine Amass, MD;  Location: [facility];  Service: Urology;  Laterality: Left;  . CYSTOSCOPY WITH RETROGRADE PYELOGRAM, URETEROSCOPY AND STENT PLACEMENT Left 08/07/2019   Procedure: CYSTOSCOPY WITH LEFT RETROGRADE PYELOGRAM, URETEROSCOPY HOLMIUM LASER AND STENT PLACEMENT;  Surgeon: Ardis Hughs, MD;  Location: [facility];  Service: Urology;  Laterality: Left;  . CYSTOSCOPY WITH STENT PLACEMENT Left 06/24/2019   Procedure: CYSTOSCOPY WITH STENT PLACEMENT LEFT URETER  WITH LEFT RETROGRADE URETERAL;  Surgeon: Ceasar Mons, MD;  Location: [facility];  Service: Urology;  Laterality: Left;  . testicles Right     Family History  Problem Relation Age of Onset  . Diabetes Father   . Coronary artery disease Other   . Heart attack Other   . Diabetes Paternal Aunt   . Stomach cancer Maternal Uncle   . Breast cancer Neg Hx   . Colon cancer Neg Hx   . Pancreatic cancer Neg Hx   . Prostate cancer Neg Hx   . Esophageal cancer Neg Hx   . Liver disease Neg Hx     Social History Reviewed with no changes to be made today.   Outpatient Medications Prior to Visit  Medication Sig Dispense Refill  . Aspirin 81 MG CAPS Take 1 tablet by mouth daily.    . insulin glargine (LANTUS SOLOSTAR) 100 UNIT/ML Solostar Pen Inject 60 Units into the skin at bedtime. 54 mL 6  . liraglutide (VICTOZA) 18 MG/3ML SOPN INJECT 1.8 MG INTO THE SKIN DAILY. 9 mL 6  . lisinopril (ZESTRIL) 5 MG tablet TAKE 1 TABLET (5 MG TOTAL) BY MOUTH DAILY. 90 tablet 3  . metFORMIN (  GLUCOPHAGE) 1000 MG tablet TAKE 1 TABLET (1,000 MG TOTAL) BY MOUTH 2 (TWO) TIMES DAILY WITH A MEAL. 60 tablet 2  . tamsulosin (FLOMAX) 0.4 MG CAPS capsule Take 2 capsules (0.8 mg total) by mouth daily after supper. 30 capsule 5  . canagliflozin (INVOKANA) 100 MG TABS tablet TAKE 1 TABLET (100 MG TOTAL) BY MOUTH DAILY BEFORE BREAKFAST. 30 tablet 3  . ezetimibe (ZETIA) 10 MG tablet TAKE 1 TABLET (10 MG TOTAL) BY MOUTH DAILY. 90 tablet 3  . insulin glargine (LANTUS SOLOSTAR) 100 UNIT/ML Solostar Pen nject 60 units into the skin at bedtime 54 mL 6  . metFORMIN (GLUCOPHAGE) 1000 MG tablet TAKE 1 TABLET (1,000 MG TOTAL) BY MOUTH 2 (TWO) TIMES DAILY WITH A MEAL. 60 tablet 2  . glucose blood (TRUE METRIX BLOOD GLUCOSE TEST) test strip Use as instructed 100 each 12  . Insulin Pen Needle 32G X 4 MM MISC USE AS DIRECTED 100 each 12  . Misc. Devices MISC Please provide patient with standard sized 29F urinary catheters for self  cathing along with lubricant. 50 each prn  . omega-3 acid ethyl esters (LOVAZA) 1 g capsule TAKE 2 CAPSULES (2 G TOTAL) BY MOUTH 2 (TWO) TIMES DAILY. 360 capsule 2  . TRUEplus Lancets 28G MISC 1 each by Does not apply route 4 (four) times daily - after meals and at bedtime. 100 each 12  . canagliflozin (INVOKANA) 100 MG TABS tablet TAKE 1 TABLET (100 MG TOTAL) BY MOUTH DAILY BEFORE BREAKFAST. (Patient not taking: Reported on 06/18/2020) 30 tablet 3  . sulfamethoxazole-trimethoprim (BACTRIM DS) 800-160 MG tablet 1 twice daily for 10 days then 1 daily for 2 weeks (Patient not taking: Reported on 07/02/2020) 34 tablet 0   No facility-administered medications prior to visit.    Allergies  Allergen Reactions  . Lipitor [Atorvastatin] Rash       Objective:    BP 113/74 (BP Location: Right Arm, Patient Position: Sitting)   Pulse (!) 110   Resp 16   Wt 251 lb (113.9 kg)   SpO2 96%   BMI 33.12 kg/m  Wt Readings from Last 3 Encounters:  07/02/20 251 lb (113.9 kg)  06/18/20 252 lb (114.3 kg)  05/11/20 242 lb (109.8 kg)    Physical Exam Vitals and nursing note reviewed.  Constitutional:      Appearance: He is well-developed.  HENT:     Head: Normocephalic and atraumatic.  Cardiovascular:     Rate and Rhythm: Normal rate and regular rhythm.     Heart sounds: Normal heart sounds. No murmur heard. No friction rub. No gallop.   Pulmonary:     Effort: Pulmonary effort is normal. No tachypnea or respiratory distress.     Breath sounds: Normal breath sounds. No decreased breath sounds, wheezing, rhonchi or rales.  Chest:     Chest wall: No tenderness.  Abdominal:     General: Bowel sounds are normal.     Palpations: Abdomen is soft.  Musculoskeletal:        General: Normal range of motion.     Cervical back: Normal range of motion.  Skin:    General: Skin is warm and dry.     Findings: Erythema and wound present.          Comments: Stage 1 pressure ulcer  Neurological:     Mental  Status: He is alert and oriented to person, place, and time.     Coordination: Coordination normal.  Psychiatric:  Behavior: Behavior normal. Behavior is cooperative.        Thought Content: Thought content normal.        Judgment: Judgment normal.          Patient has been counseled extensively about nutrition and exercise as well as the importance of adherence with medications and regular follow-up. The patient was given clear instructions to go to ER or return to medical center if symptoms don't improve, worsen or new problems develop. The patient verbalized understanding.   Follow-up: Return for 2 week CMP see me in 3 months.   Gildardo Pounds, FNP-BC [facility] [place], [place]   07/02/2020, 10:13 AM

## 2020-07-02 NOTE — Progress Notes (Signed)
F/u DM  Needs refill on invokana and zetia  Address bedsore

## 2020-07-02 NOTE — Telephone Encounter (Signed)
PA for Farxiga approved until 07/02/21

## 2020-07-03 ENCOUNTER — Ambulatory Visit
Admission: RE | Admit: 2020-07-03 | Discharge: 2020-07-03 | Disposition: A | Discharge status: Home / Self Care | Payer: Medicaid Other | Source: Ambulatory Visit | Attending: Radiation Oncology | Admitting: Radiation Oncology

## 2020-07-03 ENCOUNTER — Other Ambulatory Visit: Payer: Self-pay

## 2020-07-03 ENCOUNTER — Ambulatory Visit: Payer: Medicaid Other

## 2020-07-03 DIAGNOSIS — C61 Malignant neoplasm of prostate: Secondary | ICD-10-CM | POA: Diagnosis not present

## 2020-07-03 LAB — CBC
Hematocrit: 36.1 % — ABNORMAL LOW (ref 37.5–51.0)
Hemoglobin: 12.6 g/dL — ABNORMAL LOW (ref 13.0–17.7)
MCH: 33.5 pg — ABNORMAL HIGH (ref 26.6–33.0)
MCHC: 34.9 g/dL (ref 31.5–35.7)
MCV: 96 fL (ref 79–97)
Platelets: 179 10*3/uL (ref 150–450)
RBC: 3.76 x10E6/uL — ABNORMAL LOW (ref 4.14–5.80)
RDW: 13.1 % (ref 11.6–15.4)
WBC: 3.8 10*3/uL (ref 3.4–10.8)

## 2020-07-03 LAB — LIPID PANEL
Chol/HDL Ratio: 4.6 ratio (ref 0.0–5.0)
Cholesterol, Total: 162 mg/dL (ref 100–199)
HDL: 35 mg/dL — ABNORMAL LOW (ref >39)
LDL Chol Calc (NIH): 87 mg/dL (ref 0–99)
Triglycerides: 240 mg/dL — ABNORMAL HIGH (ref 0–149)
VLDL Cholesterol Cal: 40 mg/dL (ref 5–40)

## 2020-07-03 LAB — CMP14+EGFR
ALT: 37 IU/L (ref 0–44)
AST: 25 IU/L (ref 0–40)
Albumin/Globulin Ratio: 1.4 (ref 1.2–2.2)
Albumin: 4.2 g/dL (ref 3.8–4.9)
Alkaline Phosphatase: 112 IU/L (ref 44–121)
BUN/Creatinine Ratio: 16 (ref 9–20)
BUN: 21 mg/dL (ref 6–24)
Bilirubin Total: 0.4 mg/dL (ref 0.0–1.2)
CO2: 18 mmol/L — ABNORMAL LOW (ref 20–29)
Calcium: 9.8 mg/dL (ref 8.7–10.2)
Chloride: 99 mmol/L (ref 96–106)
Creatinine, Ser: 1.33 mg/dL — ABNORMAL HIGH (ref 0.76–1.27)
Globulin, Total: 2.9 g/dL (ref 1.5–4.5)
Glucose: 291 mg/dL — ABNORMAL HIGH (ref 65–99)
Potassium: 4.7 mmol/L (ref 3.5–5.2)
Sodium: 134 mmol/L (ref 134–144)
Total Protein: 7.1 g/dL (ref 6.0–8.5)
eGFR: 62 mL/min/{1.73_m2} (ref >59)

## 2020-07-06 ENCOUNTER — Ambulatory Visit
Admission: RE | Admit: 2020-07-06 | Discharge: 2020-07-06 | Disposition: A | Discharge status: Home / Self Care | Payer: Medicaid Other | Source: Ambulatory Visit | Attending: Radiation Oncology | Admitting: Radiation Oncology

## 2020-07-06 ENCOUNTER — Ambulatory Visit: Payer: Medicaid Other

## 2020-07-06 ENCOUNTER — Other Ambulatory Visit: Payer: Self-pay

## 2020-07-06 DIAGNOSIS — C61 Malignant neoplasm of prostate: Secondary | ICD-10-CM | POA: Diagnosis not present

## 2020-07-07 ENCOUNTER — Ambulatory Visit
Admission: RE | Admit: 2020-07-07 | Discharge: 2020-07-07 | Disposition: A | Discharge status: Home / Self Care | Payer: Medicaid Other | Source: Ambulatory Visit | Attending: Radiation Oncology | Admitting: Radiation Oncology

## 2020-07-07 ENCOUNTER — Ambulatory Visit: Payer: Medicaid Other

## 2020-07-07 ENCOUNTER — Other Ambulatory Visit: Payer: Self-pay

## 2020-07-07 DIAGNOSIS — C61 Malignant neoplasm of prostate: Secondary | ICD-10-CM | POA: Diagnosis not present

## 2020-07-08 ENCOUNTER — Ambulatory Visit: Payer: Medicaid Other

## 2020-07-08 ENCOUNTER — Other Ambulatory Visit: Payer: Self-pay

## 2020-07-08 ENCOUNTER — Ambulatory Visit
Admission: RE | Admit: 2020-07-08 | Discharge: 2020-07-08 | Disposition: A | Discharge status: Home / Self Care | Payer: Medicaid Other | Source: Ambulatory Visit | Attending: Radiation Oncology | Admitting: Radiation Oncology

## 2020-07-08 DIAGNOSIS — C61 Malignant neoplasm of prostate: Secondary | ICD-10-CM | POA: Diagnosis not present

## 2020-07-09 ENCOUNTER — Encounter: Payer: Self-pay | Admitting: Radiation Oncology

## 2020-07-09 ENCOUNTER — Ambulatory Visit
Admission: RE | Admit: 2020-07-09 | Discharge: 2020-07-09 | Disposition: A | Discharge status: Home / Self Care | Payer: Medicaid Other | Source: Ambulatory Visit | Attending: Radiation Oncology | Admitting: Radiation Oncology

## 2020-07-09 ENCOUNTER — Other Ambulatory Visit: Payer: Self-pay

## 2020-07-09 DIAGNOSIS — C61 Malignant neoplasm of prostate: Secondary | ICD-10-CM | POA: Diagnosis not present

## 2020-07-21 ENCOUNTER — Other Ambulatory Visit: Payer: Self-pay

## 2020-07-24 ENCOUNTER — Other Ambulatory Visit: Payer: Self-pay

## 2020-07-24 ENCOUNTER — Other Ambulatory Visit: Payer: Self-pay | Admitting: Nurse Practitioner

## 2020-07-24 MED ORDER — LANTUS SOLOSTAR 100 UNIT/ML ~~LOC~~ SOPN
60.0000 [IU] | PEN_INJECTOR | Freq: Every day | SUBCUTANEOUS | 2 refills | Status: DC
  Filled 2020-07-24: qty 15, 25d supply, fill #0
  Filled 2020-09-18: qty 15, 25d supply, fill #1
  Filled 2020-10-21: qty 15, 25d supply, fill #2

## 2020-07-24 MED FILL — Lisinopril Tab 5 MG: ORAL | 30 days supply | Qty: 30 | Fill #2 | Status: AC

## 2020-07-24 MED FILL — Liraglutide Soln Pen-injector 18 MG/3ML (6 MG/ML): SUBCUTANEOUS | 30 days supply | Qty: 9 | Fill #0 | Status: AC

## 2020-07-24 NOTE — Telephone Encounter (Signed)
  Notes to clinic:  looks like medication was d/c in error  Review for continued use    Requested Prescriptions  Pending Prescriptions Disp Refills   insulin glargine (LANTUS SOLOSTAR) 100 UNIT/ML Solostar Pen 54 mL 6    Sig: nject 60 units into the skin at bedtime      Endocrinology:  Diabetes - Insulins Passed - 07/24/2020  9:12 AM      Passed - HBA1C is between 0 and 7.9 and within 180 days    HbA1c, POC (controlled diabetic range)  Date Value Ref Range Status  07/02/2020 7.3 (A) 0.0 - 7.0 % Final          Passed - Valid encounter within last 6 months    Recent Outpatient Visits           3 weeks ago Uncontrolled type 2 diabetes mellitus with hyperglycemia [facility])   [place], [place] W, NP   1 month ago UTI symptoms   [facility]ie [place], [place], [place]   5 months ago Uncontrolled type 2 diabetes mellitus with hyperglycemia, with long-term current use of insulin [facility])   [place], [place] W, NP   8 months ago Uncontrolled type 2 diabetes mellitus with hyperglycemia, with long-term current use of insulin [facility])   [place], [place] W, NP   11 months ago Uncontrolled type 2 diabetes mellitus with hyperglycemia, with long-term current use of insulin [facility])   [place], [place], NP

## 2020-07-27 ENCOUNTER — Telehealth: Payer: Self-pay | Admitting: Radiation Oncology

## 2020-07-27 NOTE — Telephone Encounter (Signed)
Received voicemail message from patient's sister, Mardene Celeste, requesting a return call. Phoned Mardene Celeste. She reports her brother has dysuria and swelling making it difficult to self catheterize. Also, she reports her brother has difficulty ambulating due to perineal pain. Explained that radiation side effects can last up to a month after completion of radiation. Explained his symptoms sound similar to those he had when he had a UTI from self catheterization. Encouraged Mardene Celeste to contact her brother's urologist if his symptoms don't soon improve. She verbalized understanding and appreciation for the return call.

## 2020-07-30 ENCOUNTER — Other Ambulatory Visit: Payer: Self-pay

## 2020-07-30 ENCOUNTER — Ambulatory Visit (HOSPITAL_COMMUNITY)
Admission: EM | Admit: 2020-07-30 | Discharge: 2020-07-30 | Disposition: A | Discharge status: Home / Self Care | Payer: Medicaid Other | Attending: Family Medicine | Admitting: Family Medicine

## 2020-07-30 ENCOUNTER — Encounter (HOSPITAL_COMMUNITY): Payer: Self-pay

## 2020-07-30 DIAGNOSIS — N3090 Cystitis, unspecified without hematuria: Secondary | ICD-10-CM | POA: Diagnosis present

## 2020-07-30 LAB — POCT URINALYSIS DIPSTICK, ED / UC
Bilirubin Urine: NEGATIVE
Glucose, UA: >=1000 mg/dL — AB
Ketones, ur: NEGATIVE mg/dL
Nitrite: NEGATIVE
Protein, ur: 100 mg/dL — AB
Specific Gravity, Urine: 1.02 (ref 1.005–1.030)
Urobilinogen, UA: 0.2 mg/dL (ref 0.0–1.0)
pH: 5.5 (ref 5.0–8.0)

## 2020-07-30 MED ORDER — SULFAMETHOXAZOLE-TRIMETHOPRIM 800-160 MG PO TABS
1.0000 | ORAL_TABLET | Freq: Two times a day (BID) | ORAL | 0 refills | Status: AC
  Filled 2020-07-30: qty 14, 7d supply, fill #0

## 2020-07-30 NOTE — ED Provider Notes (Signed)
[place]    ASSESSMENT & PLAN:  1. Cystitis    Begin: Meds ordered this encounter  Medications   sulfamethoxazole-trimethoprim (BACTRIM DS) 800-160 MG tablet    Sig: Take 1 tablet by mouth 2 (two) times daily for 7 days.    Dispense:  14 tablet    Refill:  0   No signs of pyelonephritis. Urine culture sent. Will follow up with his PCP or here if not showing improvement over the next 48 hours, sooner if needed.  Outlined signs and symptoms indicating need for more acute intervention. Patient verbalized understanding. After Visit Summary given.  SUBJECTIVE:  Shane Bond is a 60 y.o. male who complains of urinary frequency and dysuria; x 3 days; abrupt onset. No specific aggravating or alleviating factors reported. Afebrile. No LE edema. Normal PO intake without n/v/d. Without specific abdominal pain. Ambulatory without difficulty.   OBJECTIVE:  Vitals:   07/30/20 1538 07/30/20 1540  BP:  106/71  Pulse: 98   Resp: 17   Temp: 98.1 F (36.7 C)   TempSrc: Oral   SpO2: 98%    General appearance: alert; no distress Lungs: unlabored respirations Abdomen: soft Extremities: no edema Skin: warm and dry Neurologic: normal gait Psychological: alert and cooperative; normal mood and affect  Labs Reviewed  POCT URINALYSIS DIPSTICK, ED / UC - Abnormal; Notable for the following components:      Result Value   Glucose, UA >=1000 (*)    Hgb urine dipstick LARGE (*)    Protein, ur 100 (*)    Leukocytes,Ua SMALL (*)    All other components within normal limits  URINE CULTURE    Allergies  Allergen Reactions   Lipitor [Atorvastatin] Rash    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   Anemia    Bladder atonia    BPH (benign prostatic hyperplasia)    CAD, NATIVE VESSEL 10/29/2009   Diabetes mellitus type 2, uncontrolled, with complications (HCC)    Hyperlipidemia LDL goal <70    Hypertension    Kidney stone    Neurogenic  bladder as late effect of cerebrovascular accident (CVA)    OBESITY 10/22/2009   Prostate cancer ([place])    Social History   Socioeconomic History   Marital status: Single    Spouse name: Not on file   Number of children: Not on file   Years of education: Not on file   Highest education level: Not on file  Occupational History    Comment: 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  Tobacco Use   Smoking status: Every Day    Pack years: 0.00    Types: E-cigarettes   Smokeless tobacco: Former  Scientific laboratory technician Use: Every day  Substance and Sexual Activity   Alcohol use: Yes    Comment: occ   Drug use: No   Sexual activity: Not Currently  Other Topics Concern   Not on file  Social History Narrative   Not on file   Social Determinants of Health   Financial Resource Strain: Not on file  Food Insecurity: Not on file  Transportation Needs: Not on file  Physical Activity: Not on file  Stress: Not on file  Social Connections: Not on file  Intimate Partner Violence: Not on file   Family History  Problem Relation Age of Onset   Diabetes Father    Coronary artery disease Other    Heart attack Other    Diabetes Paternal  Aunt    Stomach cancer Maternal Uncle    Breast cancer Neg Hx    Colon cancer Neg Hx    Pancreatic cancer Neg Hx    Prostate cancer Neg Hx    Esophageal cancer Neg Hx    Liver disease Neg Hx         Vanessa Kick, MD 07/30/20 1651

## 2020-07-30 NOTE — ED Triage Notes (Signed)
Pt in with c/o urinary frequency and penile burning x 3 days

## 2020-08-01 LAB — URINE CULTURE: Culture: >=100000 — AB

## 2020-08-03 ENCOUNTER — Other Ambulatory Visit: Payer: Self-pay

## 2020-08-12 ENCOUNTER — Other Ambulatory Visit: Payer: Self-pay

## 2020-08-20 NOTE — Progress Notes (Signed)
  Radiation Oncology         [phone] ________________________________  Name: Shane Bond MRN: [id number]  Date: 07/09/2020  DOB: 07-26-1960  End of Treatment Note  Diagnosis:   59 y.o. 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     Indication for treatment:  Curative, Definitive Radiotherapy       Radiation treatment dates:   3/9-5/19/22  Site/dose:  1. The prostate, seminal vesicles, and pelvic lymph nodes were initially treated to 45 Gy in 25 fractions of 1.8 Gy  2. The prostate only was boosted to 75 Gy with 15 additional fractions of 2.0 Gy   Beams/energy:  1. The prostate, seminal vesicles, and pelvic lymph nodes were initially treated using VMAT intensity modulated radiotherapy delivering 6 megavolt photons. Image guidance was performed with CB-CT studies prior to each fraction. He was immobilized with a body fix lower extremity mold.  2. the prostate only was boosted using VMAT intensity modulated radiotherapy delivering 6 megavolt photons. Image guidance was performed with CB-CT studies prior to each fraction. He was immobilized with a body fix lower extremity mold.  Narrative: The patient tolerated radiation treatment relatively well.   The patient experienced some minor urinary irritation and modest fatigue.  He also suffered with recurring UTIs and a sacral decubitus ulcer which led to some treatment interruptions, the longes from 4/12 to 4/25.  He was able to finish planned treatment.  Plan: The patient has completed radiation treatment. He will return to radiation oncology clinic for routine followup in one month. I advised him to call or return sooner if he has any questions or concerns related to his recovery or treatment. ________________________________  Sheral Apley. Tammi Klippel, M.D.

## 2020-08-21 ENCOUNTER — Other Ambulatory Visit: Payer: Self-pay

## 2020-08-21 MED FILL — Lisinopril Tab 5 MG: ORAL | 30 days supply | Qty: 30 | Fill #3 | Status: AC

## 2020-08-22 ENCOUNTER — Ambulatory Visit (HOSPITAL_COMMUNITY)
Admission: EM | Admit: 2020-08-22 | Discharge: 2020-08-22 | Disposition: A | Discharge status: Home / Self Care | Payer: PRIVATE HEALTH INSURANCE | Attending: Internal Medicine | Admitting: Internal Medicine

## 2020-08-22 ENCOUNTER — Other Ambulatory Visit: Payer: Self-pay

## 2020-08-22 ENCOUNTER — Encounter (HOSPITAL_COMMUNITY): Payer: Self-pay | Admitting: *Deleted

## 2020-08-22 DIAGNOSIS — R300 Dysuria: Secondary | ICD-10-CM | POA: Diagnosis present

## 2020-08-22 DIAGNOSIS — R350 Frequency of micturition: Secondary | ICD-10-CM | POA: Diagnosis present

## 2020-08-22 DIAGNOSIS — N3001 Acute cystitis with hematuria: Secondary | ICD-10-CM | POA: Diagnosis not present

## 2020-08-22 LAB — CBC WITH DIFFERENTIAL/PLATELET
Abs Immature Granulocytes: 0.02 10*3/uL (ref 0.00–0.07)
Basophils Absolute: 0 10*3/uL (ref 0.0–0.1)
Basophils Relative: 1 %
Eosinophils Absolute: 0.2 10*3/uL (ref 0.0–0.5)
Eosinophils Relative: 4 %
HCT: 38.4 % — ABNORMAL LOW (ref 39.0–52.0)
Hemoglobin: 13.1 g/dL (ref 13.0–17.0)
Immature Granulocytes: 0 %
Lymphocytes Relative: 16 %
Lymphs Abs: 0.8 10*3/uL (ref 0.7–4.0)
MCH: 33.2 pg (ref 26.0–34.0)
MCHC: 34.1 g/dL (ref 30.0–36.0)
MCV: 97.2 fL (ref 80.0–100.0)
Monocytes Absolute: 0.6 10*3/uL (ref 0.1–1.0)
Monocytes Relative: 13 %
Neutro Abs: 3.2 10*3/uL (ref 1.7–7.7)
Neutrophils Relative %: 66 %
Platelets: 187 10*3/uL (ref 150–400)
RBC: 3.95 MIL/uL — ABNORMAL LOW (ref 4.22–5.81)
RDW: 12.4 % (ref 11.5–15.5)
WBC: 4.9 10*3/uL (ref 4.0–10.5)
nRBC: 0 % (ref 0.0–0.2)

## 2020-08-22 LAB — COMPREHENSIVE METABOLIC PANEL
ALT: 45 U/L — ABNORMAL HIGH (ref 0–44)
AST: 28 U/L (ref 15–41)
Albumin: 3.7 g/dL (ref 3.5–5.0)
Alkaline Phosphatase: 81 U/L (ref 38–126)
Anion gap: 10 (ref 5–15)
BUN: 24 mg/dL — ABNORMAL HIGH (ref 6–20)
CO2: 19 mmol/L — ABNORMAL LOW (ref 22–32)
Calcium: 9.5 mg/dL (ref 8.9–10.3)
Chloride: 104 mmol/L (ref 98–111)
Creatinine, Ser: 1.17 mg/dL (ref 0.61–1.24)
GFR, Estimated: >60 mL/min (ref >60)
Glucose, Bld: 176 mg/dL — ABNORMAL HIGH (ref 70–99)
Potassium: 4.2 mmol/L (ref 3.5–5.1)
Sodium: 133 mmol/L — ABNORMAL LOW (ref 135–145)
Total Bilirubin: 0.4 mg/dL (ref 0.3–1.2)
Total Protein: 7.2 g/dL (ref 6.5–8.1)

## 2020-08-22 LAB — POCT URINALYSIS DIPSTICK, ED / UC
Bilirubin Urine: NEGATIVE
Glucose, UA: 500 mg/dL — AB
Ketones, ur: NEGATIVE mg/dL
Nitrite: NEGATIVE
Protein, ur: NEGATIVE mg/dL
Specific Gravity, Urine: 1.01 (ref 1.005–1.030)
Urobilinogen, UA: 0.2 mg/dL (ref 0.0–1.0)
pH: 5.5 (ref 5.0–8.0)

## 2020-08-22 MED ORDER — LIDOCAINE HCL (PF) 1 % IJ SOLN
INTRAMUSCULAR | Status: AC
  Filled 2020-08-22: qty 2

## 2020-08-22 MED ORDER — SULFAMETHOXAZOLE-TRIMETHOPRIM 800-160 MG PO TABS: 1.0000 | ORAL_TABLET | Freq: Two times a day (BID) | ORAL | 0 refills | Status: AC

## 2020-08-22 MED ORDER — CEFTRIAXONE SODIUM 1 G IJ SOLR
1.0000 g | Freq: Once | INTRAMUSCULAR | Status: AC
  Administered 2020-08-22: 1 g via INTRAMUSCULAR

## 2020-08-22 MED ORDER — CEFTRIAXONE SODIUM 1 G IJ SOLR
INTRAMUSCULAR | Status: AC
  Filled 2020-08-22: qty 10

## 2020-08-22 NOTE — ED Provider Notes (Signed)
[place]    CSN: [id number] Arrival date & time: 08/22/20  1016      History   Chief Complaint Chief Complaint  Patient presents with   Urinary Tract Infection    HPI DOV DILL is a 60 y.o. male.   Patient presents today with a 3 to 4-day history of UTI symptoms.  Reports dysuria, urinary frequency, urinary urgency.  He has a history of recurrent UTI and states current symptoms are similar to previous episodes of this condition.  He does report undergoing prostate ablation due to prostate cancer 5 to 6 weeks ago.  He does use In-N-Out catheters and reports were using them after sterilizing with alcohol.  He is not on any active chemotherapy.  He is immunosuppressed due to diabetes.  Reports blood sugars have been stable with fasting blood sugar 160 this morning.  He does have a history of nephrolithiasis and has seen a urologist but states current symptoms are not similar to previous episodes of this condition.  He denies any fever, nausea, hematuria, lightheadedness, chest pain, shortness of breath.   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   Anemia    Bladder atonia    BPH (benign prostatic hyperplasia)    CAD, NATIVE VESSEL 10/29/2009   Diabetes mellitus type 2, uncontrolled, with complications ([place])    Hyperlipidemia LDL goal <70    Hypertension    Kidney stone    Neurogenic bladder as late effect of cerebrovascular accident (CVA)    OBESITY 10/22/2009   Prostate cancer [facility])     Patient Active Problem List   Diagnosis Date Noted   Malignant neoplasm of prostate ([place]) 09/03/2019   Protein-calorie malnutrition, severe ([place]) 06/24/2019   Hydronephrosis with renal and ureteral calculus obstruction 06/24/2019   Thrombocytopenia ([place]) 06/21/2019   Normocytic anemia 06/21/2019   Bacteremia due to Klebsiella pneumoniae 06/21/2019   Lung infiltrate 06/20/2019   Sepsis due to urinary tract infection ([place]) 06/19/2019   Hyperkalemia  06/19/2019   Acute kidney injury ([place]) 06/19/2019   Type 2 diabetes mellitus with hyperglycemia, with long-term current use of insulin ([place]) 04/13/2017   Diabetes mellitus without complication ([place]) 29/52/8413   Former smoker 11/02/2015   Diabetes type 2, uncontrolled ([place]) 06/18/2015   Hyperlipidemia associated with type 2 diabetes mellitus ([place]) 06/18/2015   BPH (benign prostatic hyperplasia) 06/18/2015   Self-catheterizes urinary bladder 06/18/2015   Hypertension associated with diabetes ([place]) 07/08/2010   CAD, NATIVE VESSEL 10/29/2009   AMI, INFERIOR WALL 10/22/2009    Past Surgical History:  Procedure Laterality Date   BACK SURGERY     CORONARY STENT PLACEMENT     CYSTOSCOPY W/ URETERAL STENT PLACEMENT  02/26/2012   Procedure: CYSTOSCOPY WITH RETROGRADE PYELOGRAM/URETERAL STENT PLACEMENT;  Surgeon: Bernestine Amass, MD;  Location: [facility];  Service: Urology;  Laterality: Left;   CYSTOSCOPY WITH RETROGRADE PYELOGRAM, URETEROSCOPY AND STENT PLACEMENT Left 08/07/2019   Procedure: CYSTOSCOPY WITH LEFT RETROGRADE PYELOGRAM, URETEROSCOPY HOLMIUM LASER AND STENT PLACEMENT;  Surgeon: Ardis Hughs, MD;  Location: [facility];  Service: Urology;  Laterality: Left;   CYSTOSCOPY WITH STENT PLACEMENT Left 06/24/2019   Procedure: CYSTOSCOPY WITH STENT PLACEMENT LEFT URETER WITH LEFT RETROGRADE URETERAL;  Surgeon: Ceasar Mons, MD;  Location: [facility];  Service: Urology;  Laterality: Left;   testicles Right        Home Medications    Prior to Admission medications   Medication Sig Start Date End Date  Taking? Authorizing Provider  sulfamethoxazole-trimethoprim (BACTRIM DS) 800-160 MG tablet Take 1 tablet by mouth 2 (two) times daily for 7 days. 08/22/20 08/29/20 Yes Analese Sovine, Derry Skill, PA-C  Aspirin 81 MG CAPS Take 1 tablet by mouth daily.    [provider]  dapagliflozin propanediol (FARXIGA) 10 MG TABS tablet Take 1 tablet (10 mg total) by mouth daily before breakfast. 07/02/20  09/30/20  Gildardo Pounds, NP  ezetimibe (ZETIA) 10 MG tablet TAKE 1 TABLET (10 MG TOTAL) BY MOUTH DAILY. 07/02/20 07/02/21  Gildardo Pounds, NP  glucose blood (TRUE METRIX BLOOD GLUCOSE TEST) test strip Use as instructed 11/05/18   Gildardo Pounds, NP  insulin glargine (LANTUS SOLOSTAR) 100 UNIT/ML Solostar Pen Inject 60 Units into the skin daily. 07/24/20 10/07/20  Charlott Rakes, MD  Insulin Pen Needle 32G X 4 MM MISC USE AS DIRECTED 03/06/20 03/06/21  Gildardo Pounds, NP  liraglutide (VICTOZA) 18 MG/3ML SOPN INJECT 1.8 MG INTO THE SKIN DAILY. 11/04/19 11/03/20  Gildardo Pounds, NP  lisinopril (ZESTRIL) 5 MG tablet TAKE 1 TABLET (5 MG TOTAL) BY MOUTH DAILY. 11/05/19 11/04/20  Gildardo Pounds, NP  metFORMIN (GLUCOPHAGE) 1000 MG tablet TAKE 1 TABLET (1,000 MG TOTAL) BY MOUTH 2 (TWO) TIMES DAILY WITH A MEAL. 06/18/20 06/18/21  Argentina Donovan, PA-C  Misc. Devices (CAREX COCCYX CUSHION) MISC Use daily when sitting for pressure relief ICD10 L98.429 07/02/20   Gildardo Pounds, NP  Misc. Devices MISC Please provide patient with standard sized 78F urinary catheters for self cathing along with lubricant. 05/29/18   Gildardo Pounds, NP  omega-3 acid ethyl esters (LOVAZA) 1 g capsule TAKE 2 CAPSULES (2 G TOTAL) BY MOUTH 2 (TWO) TIMES DAILY. 06/18/20 06/18/21  Argentina Donovan, PA-C  tamsulosin (FLOMAX) 0.4 MG CAPS capsule Take 2 capsules (0.8 mg total) by mouth daily after supper. 06/15/20   Tyler Pita, MD  TRUEplus Lancets 28G MISC 1 each by Does not apply route 4 (four) times daily - after meals and at bedtime. 11/05/18   Gildardo Pounds, NP  gemfibrozil (LOPID) 600 MG tablet Take 1 tablet (600 mg total) by mouth 2 (two) times daily before a meal. 07/20/19 11/04/19  Gildardo Pounds, NP    Family History Family History  Problem Relation Age of Onset   Diabetes Father    Coronary artery disease Other    Heart attack Other    Diabetes Paternal Aunt    Stomach cancer Maternal Uncle    Breast cancer Neg Hx     Colon cancer Neg Hx    Pancreatic cancer Neg Hx    Prostate cancer Neg Hx    Esophageal cancer Neg Hx    Liver disease Neg Hx     Social History Social History   Tobacco Use   Smoking status: Every Day    Pack years: 0.00    Types: E-cigarettes   Smokeless tobacco: Former  Scientific laboratory technician Use: Every day  Substance Use Topics   Alcohol use: Yes    Comment: occ   Drug use: No     Allergies   Lipitor [atorvastatin]   Review of Systems Review of Systems  Constitutional:  Negative for activity change, appetite change, fatigue and fever.  Respiratory:  Negative for cough and shortness of breath.   Cardiovascular:  Negative for chest pain.  Gastrointestinal:  Negative for abdominal pain, diarrhea, nausea and vomiting.  Genitourinary:  Positive for dysuria, frequency and urgency. Negative  for difficulty urinating, flank pain and hematuria.  Musculoskeletal:  Negative for arthralgias and myalgias.  Neurological:  Negative for dizziness, light-headedness and headaches.    Physical Exam Triage Vital Signs ED Triage Vitals  Enc Vitals Group     BP 08/22/20 1043 (!) 102/56     Pulse Rate 08/22/20 1043 (!) 112     Resp 08/22/20 1043 18     Temp 08/22/20 1043 98.9 F (37.2 C)     Temp src --      SpO2 08/22/20 1043 95 %     Weight --      Height --      Head Circumference --      Peak Flow --      Pain Score 08/22/20 1040 2     Pain Loc --      Pain Edu? --      Excl. in [place]? --    No data found.  Updated Vital Signs BP 103/74 (BP Location: Right Arm)   Pulse (!) 108   Temp 98.9 F (37.2 C)   Resp 18   SpO2 93%   Visual Acuity Right Eye Distance:   Left Eye Distance:   Bilateral Distance:    Right Eye Near:   Left Eye Near:    Bilateral Near:     Physical Exam Vitals reviewed.  Constitutional:      General: He is awake.     Appearance: Normal appearance. He is normal weight. He is not ill-appearing.     Comments: Very pleasant male appears  stated age in no acute distress  HENT:     Head: Normocephalic and atraumatic.     Mouth/Throat:     Pharynx: Uvula midline. No oropharyngeal exudate or posterior oropharyngeal erythema.  Cardiovascular:     Rate and Rhythm: Normal rate and regular rhythm.     Heart sounds: Normal heart sounds, S1 normal and S2 normal. No murmur heard. Pulmonary:     Effort: Pulmonary effort is normal.     Breath sounds: Normal breath sounds. No stridor. No wheezing, rhonchi or rales.     Comments: Clear to auscultation bilaterally Abdominal:     General: Bowel sounds are normal.     Palpations: Abdomen is soft.     Tenderness: There is no abdominal tenderness. There is no right CVA tenderness, left CVA tenderness, guarding or rebound.     Comments: Benign abdominal exam.  No tenderness palpation.  No CVA tenderness.  Neurological:     Mental Status: He is alert.  Psychiatric:        Behavior: Behavior is cooperative.     UC Treatments / Results  Labs (all labs ordered are listed, but only abnormal results are displayed) Labs Reviewed  POCT URINALYSIS DIPSTICK, ED / UC - Abnormal; Notable for the following components:      Result Value   Glucose, UA 500 (*)    Hgb urine dipstick MODERATE (*)    Leukocytes,Ua SMALL (*)    All other components within normal limits  URINE CULTURE  CBC WITH DIFFERENTIAL/PLATELET  COMPREHENSIVE METABOLIC PANEL    EKG   Radiology No results found.  Procedures Procedures (including critical care time)  Medications Ordered in UC Medications  cefTRIAXone (ROCEPHIN) injection 1 g (has no administration in time range)    Initial Impression / Assessment and Plan / UC Course  I have reviewed the triage vital signs and the nursing notes.  Pertinent labs & imaging results that  were available during my care of the patient were reviewed by me and considered in my medical decision making (see chart for details).      UA showed trace leukocyte esterase and  hemoglobin indicative of infection.  Patient had mild tachycardia with soft blood pressure but on retake this improved.  Discussed concern for severe infection and potential utility of going to ER but patient declined this today.  CBC and CMP obtained today-results pending.  If CBC shows significant leukocytosis patient will need to go to the hospital.  He was given injection of Rocephin while in clinic and started on Bactrim DS.  No indication for dose adjustment based on CMP from 07/02/2020 with calculated creatinine clearance of 96.3 mL/min.  He was encouraged to drink plenty of fluid.  Recommended that he monitor her blood pressure and if this gets low he needs to go to the emergency room.  Discussed alarm symptoms that warrant emergent evaluation.  Strict return precautions given to which patient expressed understanding.  Final Clinical Impressions(s) / UC Diagnoses   Final diagnoses:  Acute cystitis with hematuria  Dysuria  Urinary frequency     Discharge Instructions      Take Bactrim DS twice daily for 7 days.  We will contact you if your urine culture indicates we need to change the antibiotics.  Make sure you are drinking plenty of fluid.  If you have any worsening symptoms including fever, chills, lightheadedness, generalized weakness, persistently low blood pressure, heart racing you need to go to the emergency room as we discussed.  Please follow-up with PCP within a few days to ensure symptom improvement.     ED Prescriptions     Medication Sig Dispense Auth. Provider   sulfamethoxazole-trimethoprim (BACTRIM DS) 800-160 MG tablet Take 1 tablet by mouth 2 (two) times daily for 7 days. 14 tablet [place] Mckercher, Derry Skill, PA-C      PDMP not reviewed this encounter.   Terrilee Croak, PA-C 08/22/20 1148

## 2020-08-22 NOTE — Discharge Instructions (Signed)
Take Bactrim DS twice daily for 7 days.  We will contact you if your urine culture indicates we need to change the antibiotics.  Make sure you are drinking plenty of fluid.  If you have any worsening symptoms including fever, chills, lightheadedness, generalized weakness, persistently low blood pressure, heart racing you need to go to the emergency room as we discussed.  Please follow-up with PCP within a few days to ensure symptom improvement.

## 2020-08-22 NOTE — ED Triage Notes (Signed)
PT reports Sx of UTI for 1 week

## 2020-08-24 LAB — URINE CULTURE: Culture: >=100000 — AB

## 2020-08-26 ENCOUNTER — Other Ambulatory Visit: Payer: Self-pay

## 2020-08-26 ENCOUNTER — Emergency Department (HOSPITAL_COMMUNITY)
Admission: EM | Admit: 2020-08-26 | Discharge: 2020-08-26 | Disposition: A | Discharge status: Home / Self Care | Payer: Medicaid Other | Attending: Emergency Medicine | Admitting: Emergency Medicine

## 2020-08-26 ENCOUNTER — Emergency Department (HOSPITAL_COMMUNITY)
Admission: EM | Admit: 2020-08-26 | Discharge: 2020-08-26 | Disposition: A | Discharge status: Home / Self Care | Payer: Medicaid Other | Source: Home / Self Care | Attending: Emergency Medicine | Admitting: Emergency Medicine

## 2020-08-26 ENCOUNTER — Encounter (HOSPITAL_COMMUNITY): Payer: Self-pay

## 2020-08-26 DIAGNOSIS — Z951 Presence of aortocoronary bypass graft: Secondary | ICD-10-CM | POA: Diagnosis not present

## 2020-08-26 DIAGNOSIS — E1169 Type 2 diabetes mellitus with other specified complication: Secondary | ICD-10-CM | POA: Insufficient documentation

## 2020-08-26 DIAGNOSIS — I2510 Atherosclerotic heart disease of native coronary artery without angina pectoris: Secondary | ICD-10-CM | POA: Insufficient documentation

## 2020-08-26 DIAGNOSIS — R350 Frequency of micturition: Secondary | ICD-10-CM | POA: Insufficient documentation

## 2020-08-26 DIAGNOSIS — Z8546 Personal history of malignant neoplasm of prostate: Secondary | ICD-10-CM | POA: Insufficient documentation

## 2020-08-26 DIAGNOSIS — Z7982 Long term (current) use of aspirin: Secondary | ICD-10-CM | POA: Insufficient documentation

## 2020-08-26 DIAGNOSIS — Z79899 Other long term (current) drug therapy: Secondary | ICD-10-CM | POA: Insufficient documentation

## 2020-08-26 DIAGNOSIS — I10 Essential (primary) hypertension: Secondary | ICD-10-CM | POA: Insufficient documentation

## 2020-08-26 DIAGNOSIS — Z7984 Long term (current) use of oral hypoglycemic drugs: Secondary | ICD-10-CM | POA: Insufficient documentation

## 2020-08-26 DIAGNOSIS — R310 Gross hematuria: Secondary | ICD-10-CM | POA: Diagnosis not present

## 2020-08-26 DIAGNOSIS — R300 Dysuria: Secondary | ICD-10-CM | POA: Insufficient documentation

## 2020-08-26 DIAGNOSIS — R319 Hematuria, unspecified: Secondary | ICD-10-CM

## 2020-08-26 DIAGNOSIS — F17290 Nicotine dependence, other tobacco product, uncomplicated: Secondary | ICD-10-CM | POA: Insufficient documentation

## 2020-08-26 DIAGNOSIS — Z794 Long term (current) use of insulin: Secondary | ICD-10-CM | POA: Diagnosis not present

## 2020-08-26 DIAGNOSIS — E119 Type 2 diabetes mellitus without complications: Secondary | ICD-10-CM | POA: Insufficient documentation

## 2020-08-26 LAB — BASIC METABOLIC PANEL
Anion gap: 10 (ref 5–15)
BUN: 24 mg/dL — ABNORMAL HIGH (ref 6–20)
CO2: 24 mmol/L (ref 22–32)
Calcium: 9.3 mg/dL (ref 8.9–10.3)
Chloride: 103 mmol/L (ref 98–111)
Creatinine, Ser: 0.87 mg/dL (ref 0.61–1.24)
GFR, Estimated: >60 mL/min (ref >60)
Glucose, Bld: 210 mg/dL — ABNORMAL HIGH (ref 70–99)
Potassium: 4.2 mmol/L (ref 3.5–5.1)
Sodium: 137 mmol/L (ref 135–145)

## 2020-08-26 LAB — URINALYSIS, ROUTINE W REFLEX MICROSCOPIC
Bacteria, UA: NONE SEEN
RBC / HPF: >50 RBC/hpf — ABNORMAL HIGH (ref 0–5)

## 2020-08-26 LAB — CBC
HCT: 35.5 % — ABNORMAL LOW (ref 39.0–52.0)
Hemoglobin: 12.1 g/dL — ABNORMAL LOW (ref 13.0–17.0)
MCH: 32.8 pg (ref 26.0–34.0)
MCHC: 34.1 g/dL (ref 30.0–36.0)
MCV: 96.2 fL (ref 80.0–100.0)
Platelets: 169 10*3/uL (ref 150–400)
RBC: 3.69 MIL/uL — ABNORMAL LOW (ref 4.22–5.81)
RDW: 13.2 % (ref 11.5–15.5)
WBC: 3.6 10*3/uL — ABNORMAL LOW (ref 4.0–10.5)
nRBC: 0 % (ref 0.0–0.2)

## 2020-08-26 NOTE — ED Triage Notes (Signed)
Patient BIB [facility] from home and is currently being treated for UTI and taking medication at the moment. He said his hematuria is a lot worse this morning. He said he has intermittent blood in urine, but tonight is more than normal. History of prostate cancer. Patient in and out caths. Patient takes BP, lantus and morning meds at 7.   EMS vitals BP 164/100 HR 92 O2 98% CBG 217

## 2020-08-26 NOTE — Discharge Instructions (Addendum)
Follow-up with your urologist as soon as possible.  Continue to self cath as you have been doing and finish all antibiotics.  Return to the ER if you are unable to urinate, have abdominal or flank pain, or develop a fever.

## 2020-08-26 NOTE — ED Triage Notes (Signed)
Patient was here at [facility] this morning and discharged.   Patient has continue blood in urine and wants to be seen again. Reports urgency and frequency.    Denies pain. A/Ox4 Ambulatory   Hx: prostate cancer.

## 2020-08-26 NOTE — Discharge Instructions (Signed)
Please read and follow all provided instructions.  Your diagnoses today include:  1. Gross hematuria     Tests performed today include: Blood cell counts (white, red, and platelets) -stable red blood cell count Electrolytes  Kidney function test -normal kidney function Urine test to check for infection -shows blood, no infection Vital signs. See below for your results today.   Medications prescribed:  None  Take any prescribed medications only as directed.  Home care instructions:  Follow any educational materials contained in this packet.  BE VERY CAREFUL not to take multiple medicines containing Tylenol (also called acetaminophen). Doing so can lead to an overdose which can damage your liver and cause liver failure and possibly death.   Follow-up instructions: Please call your urologist for further instructions.  Return instructions:  Please return to the Emergency Department if you experience worsening symptoms.  Return with fever, pain. Please return if you have any other emergent concerns.  Additional Information:  Your vital signs today were: BP (!) 114/56 (BP Location: Right Arm)   Pulse 71   Temp 98.1 F (36.7 C) (Oral)   Resp 18   Ht 6\' 1"  (1.854 m)   Wt 113.9 kg   SpO2 97%   BMI 33.13 kg/m  If your blood pressure (BP) was elevated above 135/85 this visit, please have this repeated by your doctor within one month. --------------

## 2020-08-26 NOTE — ED Provider Notes (Signed)
[facility] DEPT Provider Note   CSN: [id number] Arrival date & time: 08/26/20  0604     History Chief Complaint  Patient presents with   Hematuria    Shane Bond is a 60 y.o. male.  Patient with history of neurogenic bladder requiring in and out catheterization, kidney stone, diabetes, thrombocytopenia, UTI, prostate cancer status postradiation --presents to the emergency department for hematuria.  Patient had dysuria, increased frequency and urgency and was evaluated at urgent care on 7/2.  He was started on Bactrim for UTI.  Patient's urine culture grew out Serratia which is susceptible to Bactrim.  Patient states that irritative UTI symptoms have been improving however this morning when he cath himself, he had a lot of hematuria.  He reports bright red blood in the urine.  He denies fevers, flank pain, nausea or vomiting. The onset of this condition was acute. The course is constant. Aggravating factors: none. Alleviating factors: none.        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   Anemia    Bladder atonia    BPH (benign prostatic hyperplasia)    CAD, NATIVE VESSEL 10/29/2009   Diabetes mellitus type 2, uncontrolled, with complications ([place])    Hyperlipidemia LDL goal <70    Hypertension    Kidney stone    Neurogenic bladder as late effect of cerebrovascular accident (CVA)    OBESITY 10/22/2009   Prostate cancer [facility])     Patient Active Problem List   Diagnosis Date Noted   Malignant neoplasm of prostate ([place]) 09/03/2019   Protein-calorie malnutrition, severe ([place]) 06/24/2019   Hydronephrosis with renal and ureteral calculus obstruction 06/24/2019   Thrombocytopenia ([place]) 06/21/2019   Normocytic anemia 06/21/2019   Bacteremia due to Klebsiella pneumoniae 06/21/2019   Lung infiltrate 06/20/2019   Sepsis due to urinary tract infection ([place]) 06/19/2019   Hyperkalemia 06/19/2019   Acute kidney injury ([place])  06/19/2019   Type 2 diabetes mellitus with hyperglycemia, with long-term current use of insulin ([place]) 04/13/2017   Diabetes mellitus without complication ([place]) 54/27/0623   Former smoker 11/02/2015   Diabetes type 2, uncontrolled ([place]) 06/18/2015   Hyperlipidemia associated with type 2 diabetes mellitus ([place]) 06/18/2015   BPH (benign prostatic hyperplasia) 06/18/2015   Self-catheterizes urinary bladder 06/18/2015   Hypertension associated with diabetes ([place]) 07/08/2010   CAD, NATIVE VESSEL 10/29/2009   AMI, INFERIOR WALL 10/22/2009    Past Surgical History:  Procedure Laterality Date   BACK SURGERY     CORONARY STENT PLACEMENT     CYSTOSCOPY W/ URETERAL STENT PLACEMENT  02/26/2012   Procedure: CYSTOSCOPY WITH RETROGRADE PYELOGRAM/URETERAL STENT PLACEMENT;  Surgeon: Bernestine Amass, MD;  Location: [facility];  Service: Urology;  Laterality: Left;   CYSTOSCOPY WITH RETROGRADE PYELOGRAM, URETEROSCOPY AND STENT PLACEMENT Left 08/07/2019   Procedure: CYSTOSCOPY WITH LEFT RETROGRADE PYELOGRAM, URETEROSCOPY HOLMIUM LASER AND STENT PLACEMENT;  Surgeon: Ardis Hughs, MD;  [facility];  Service: Urology;  Laterality: Left;   CYSTOSCOPY WITH STENT PLACEMENT Left 06/24/2019   Procedure: CYSTOSCOPY WITH STENT PLACEMENT LEFT URETER WITH LEFT RETROGRADE URETERAL;  Surgeon: Ceasar Mons, MD;  [facility];  Service: Urology;  Laterality: Left;   testicles Right        Family History  Problem Relation Age of Onset   Diabetes Father    Coronary artery disease Other    Heart attack Other    Diabetes Paternal Aunt    Stomach  cancer Maternal Uncle    Breast cancer Neg Hx    Colon cancer Neg Hx    Pancreatic cancer Neg Hx    Prostate cancer Neg Hx    Esophageal cancer Neg Hx    Liver disease Neg Hx     Social History   Tobacco Use   Smoking status: Every Day    Pack years: 0.00    Types: E-cigarettes   Smokeless tobacco: Former  Scientific laboratory technician Use: Every day   Substance Use Topics   Alcohol use: Yes    Comment: occ   Drug use: No    Home Medications Prior to Admission medications   Medication Sig Start Date End Date Taking? Authorizing Provider  Aspirin 81 MG CAPS Take 1 tablet by mouth daily.    [provider]  dapagliflozin propanediol (FARXIGA) 10 MG TABS tablet Take 1 tablet (10 mg total) by mouth daily before breakfast. 07/02/20 09/30/20  Gildardo Pounds, NP  ezetimibe (ZETIA) 10 MG tablet TAKE 1 TABLET (10 MG TOTAL) BY MOUTH DAILY. 07/02/20 07/02/21  Gildardo Pounds, NP  glucose blood (TRUE METRIX BLOOD GLUCOSE TEST) test strip Use as instructed 11/05/18   Gildardo Pounds, NP  insulin glargine (LANTUS SOLOSTAR) 100 UNIT/ML Solostar Pen Inject 60 Units into the skin daily. 07/24/20 10/07/20  Charlott Rakes, MD  Insulin Pen Needle 32G X 4 MM MISC USE AS DIRECTED 03/06/20 03/06/21  Gildardo Pounds, NP  liraglutide (VICTOZA) 18 MG/3ML SOPN INJECT 1.8 MG INTO THE SKIN DAILY. 11/04/19 11/03/20  Gildardo Pounds, NP  lisinopril (ZESTRIL) 5 MG tablet TAKE 1 TABLET (5 MG TOTAL) BY MOUTH DAILY. 11/05/19 11/04/20  Gildardo Pounds, NP  metFORMIN (GLUCOPHAGE) 1000 MG tablet TAKE 1 TABLET (1,000 MG TOTAL) BY MOUTH 2 (TWO) TIMES DAILY WITH A MEAL. 06/18/20 06/18/21  Argentina Donovan, PA-C  Misc. Devices (CAREX COCCYX CUSHION) MISC Use daily when sitting for pressure relief ICD10 L98.429 07/02/20   Gildardo Pounds, NP  Misc. Devices MISC Please provide patient with standard sized 62F urinary catheters for self cathing along with lubricant. 05/29/18   Gildardo Pounds, NP  omega-3 acid ethyl esters (LOVAZA) 1 g capsule TAKE 2 CAPSULES (2 G TOTAL) BY MOUTH 2 (TWO) TIMES DAILY. 06/18/20 06/18/21  Argentina Donovan, PA-C  sulfamethoxazole-trimethoprim (BACTRIM DS) 800-160 MG tablet Take 1 tablet by mouth 2 (two) times daily for 7 days. 08/22/20 08/29/20  Raspet, Derry Skill, PA-C  tamsulosin (FLOMAX) 0.4 MG CAPS capsule Take 2 capsules (0.8 mg total) by mouth daily after  supper. 06/15/20   Tyler Pita, MD  TRUEplus Lancets 28G MISC 1 each by Does not apply route 4 (four) times daily - after meals and at bedtime. 11/05/18   Gildardo Pounds, NP  gemfibrozil (LOPID) 600 MG tablet Take 1 tablet (600 mg total) by mouth 2 (two) times daily before a meal. 07/20/19 11/04/19  Gildardo Pounds, NP    Allergies    Lipitor [atorvastatin]  Review of Systems   Review of Systems  Constitutional:  Negative for fever.  HENT:  Negative for rhinorrhea and sore throat.   Eyes:  Negative for redness.  Respiratory:  Negative for cough.   Cardiovascular:  Negative for chest pain.  Gastrointestinal:  Negative for abdominal pain, diarrhea, nausea and vomiting.  Genitourinary:  Positive for hematuria. Negative for dysuria and flank pain.  Musculoskeletal:  Negative for myalgias.  Skin:  Negative for rash.  Neurological:  Negative  for headaches.   Physical Exam Updated Vital Signs BP (!) 147/82 (BP Location: Right Arm)   Pulse 88   Temp 98 F (36.7 C) (Oral)   Resp 16   Ht 6\' 1"  (1.854 m)   Wt 113.9 kg   SpO2 96%   BMI 33.13 kg/m   Physical Exam Vitals and nursing note reviewed.  Constitutional:      General: He is not in acute distress.    Appearance: He is well-developed.  HENT:     Head: Normocephalic and atraumatic.  Eyes:     General:        Right eye: No discharge.        Left eye: No discharge.     Conjunctiva/sclera: Conjunctivae normal.  Cardiovascular:     Rate and Rhythm: Normal rate and regular rhythm.     Heart sounds: Normal heart sounds.  Pulmonary:     Effort: Pulmonary effort is normal.     Breath sounds: Normal breath sounds.  Abdominal:     Palpations: Abdomen is soft.     Tenderness: There is no abdominal tenderness.  Musculoskeletal:     Cervical back: Normal range of motion and neck supple.  Skin:    General: Skin is warm and dry.  Neurological:     Mental Status: He is alert.    ED Results / Procedures / Treatments    Labs (all labs ordered are listed, but only abnormal results are displayed) Labs Reviewed  URINE CULTURE  URINALYSIS, ROUTINE W REFLEX MICROSCOPIC  CBC  BASIC METABOLIC PANEL    EKG None  Radiology No results found.  Procedures Procedures   Medications Ordered in ED Medications - No data to display  ED Course  I have reviewed the triage vital signs and the nursing notes.  Pertinent labs & imaging results that were available during my care of the patient were reviewed by me and considered in my medical decision making (see chart for details).  Patient seen and examined.  He looks very comfortable at time of initial exam.  No abdominal pain.  Symptoms are not really suggestive of pyelonephritis or renal colic.  Reviewed urine culture.  He denies external sores or lesions.  Will check CBC, BMP, repeat UA and send culture.  Vital signs reviewed and are as follows: BP (!) 147/82 (BP Location: Right Arm)   Pulse 88   Temp 98 F (36.7 C) (Oral)   Resp 16   Ht 6\' 1"  (1.854 m)   Wt 113.9 kg   SpO2 96%   BMI 33.13 kg/m   8:59 AM work-up is reassuring.  Patient with hematuria but no significant white blood cells in the urine.  Normal renal function.  Mild anemia but appears to be at baseline.  Patient updated on results.  He is reassured that his kidney function is normal.  He will follow-up with his urologist.  Encouraged return to the emergency department with fever, worsening pain, worsening bleeding.  Encouraged to continue Bactrim as it appears that this is working.    MDM Rules/Calculators/A&P                          Patient with recent UTI, prostate radiation, with gross hematuria.  UTI seems to be improving.  He denies other significant irritative urinary symptoms.  No signs of sepsis.  He does not appear to have symptoms consistent with a kidney stone.  Hematuria possibly due to trauma  from in and out catheterization.  Cannot entirely rule out resolving cystitis or  possibly residual irritation and inflammation from radiation therapy which he completed about a month ago.  Overall he appears well.  No hemodynamic compromise.  Feel stable for discharge with urology follow-up.   Final Clinical Impression(s) / ED Diagnoses Final diagnoses:  Gross hematuria    Rx / DC Orders ED Discharge Orders     None        Carlisle Cater, PA-C 08/26/20 0901    Truddie Hidden, MD 08/26/20 1324

## 2020-08-26 NOTE — ED Provider Notes (Signed)
[facility] DEPT Provider Note   CSN: [id number] Arrival date & time: 08/26/20  1301     History Chief Complaint  Patient presents with   Hematuria    Shane Bond is a 60 y.o. male.  60 year old male with extensive past medical history below including prostate cancer, type 2 diabetes mellitus, CVA with neurogenic bladder requiring self-catheterization, hypertension, hyperlipidemia, CAD who presents with hematuria.  Patient was evaluated in the ED earlier today for gross hematuria.  Lab work was reassuring and he was discharged home with instructions to continue course of Bactrim that he was given on 7/2 for UTI.  He reports that since discharge today, he has continued to have gross hematuria at home.  He denies any associated pain including no abdominal or flank pain.  No nausea, vomiting, or fevers.  With self-catheterization he is able to empty his bladder adequately.  He denies any other complaints, was just concerned that he may be losing blood.  The history is provided by the patient.  Hematuria      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   Anemia    Bladder atonia    BPH (benign prostatic hyperplasia)    CAD, NATIVE VESSEL 10/29/2009   Diabetes mellitus type 2, uncontrolled, with complications ([place])    Hyperlipidemia LDL goal <70    Hypertension    Kidney stone    Neurogenic bladder as late effect of cerebrovascular accident (CVA)    OBESITY 10/22/2009   Prostate cancer [facility])     Patient Active Problem List   Diagnosis Date Noted   Malignant neoplasm of prostate ([place]) 09/03/2019   Protein-calorie malnutrition, severe ([place]) 06/24/2019   Hydronephrosis with renal and ureteral calculus obstruction 06/24/2019   Thrombocytopenia ([place]) 06/21/2019   Normocytic anemia 06/21/2019   Bacteremia due to Klebsiella pneumoniae 06/21/2019   Lung infiltrate 06/20/2019   Sepsis due to urinary tract infection ([place])  06/19/2019   Hyperkalemia 06/19/2019   Acute kidney injury ([place]) 06/19/2019   Type 2 diabetes mellitus with hyperglycemia, with long-term current use of insulin ([place]) 04/13/2017   Diabetes mellitus without complication ([place]) 78/29/5621   Former smoker 11/02/2015   Diabetes type 2, uncontrolled ([place]) 06/18/2015   Hyperlipidemia associated with type 2 diabetes mellitus ([place]) 06/18/2015   BPH (benign prostatic hyperplasia) 06/18/2015   Self-catheterizes urinary bladder 06/18/2015   Hypertension associated with diabetes ([place]) 07/08/2010   CAD, NATIVE VESSEL 10/29/2009   AMI, INFERIOR WALL 10/22/2009    Past Surgical History:  Procedure Laterality Date   BACK SURGERY     CORONARY STENT PLACEMENT     CYSTOSCOPY W/ URETERAL STENT PLACEMENT  02/26/2012   Procedure: CYSTOSCOPY WITH RETROGRADE PYELOGRAM/URETERAL STENT PLACEMENT;  Surgeon: Bernestine Amass, MD;  [facility];  Service: Urology;  Laterality: Left;   CYSTOSCOPY WITH RETROGRADE PYELOGRAM, URETEROSCOPY AND STENT PLACEMENT Left 08/07/2019   Procedure: CYSTOSCOPY WITH LEFT RETROGRADE PYELOGRAM, URETEROSCOPY HOLMIUM LASER AND STENT PLACEMENT;  Surgeon: Ardis Hughs, MD;  [facility];  Service: Urology;  Laterality: Left;   CYSTOSCOPY WITH STENT PLACEMENT Left 06/24/2019   Procedure: CYSTOSCOPY WITH STENT PLACEMENT LEFT URETER WITH LEFT RETROGRADE URETERAL;  Surgeon: Ceasar Mons, MD;  [facility];  Service: Urology;  Laterality: Left;   testicles Right        Family History  Problem Relation Age of Onset   Diabetes Father    Coronary artery disease Other    Heart  attack Other    Diabetes Paternal Aunt    Stomach cancer Maternal Uncle    Breast cancer Neg Hx    Colon cancer Neg Hx    Pancreatic cancer Neg Hx    Prostate cancer Neg Hx    Esophageal cancer Neg Hx    Liver disease Neg Hx     Social History   Tobacco Use   Smoking status: Every Day    Pack years: 0.00    Types: E-cigarettes    Smokeless tobacco: Former  Scientific laboratory technician Use: Every day  Substance Use Topics   Alcohol use: Yes    Comment: occ   Drug use: No    Home Medications Prior to Admission medications   Medication Sig Start Date End Date Taking? Authorizing Provider  Aspirin 81 MG CAPS Take 1 tablet by mouth daily.    [provider]  dapagliflozin propanediol (FARXIGA) 10 MG TABS tablet Take 1 tablet (10 mg total) by mouth daily before breakfast. 07/02/20 09/30/20  Gildardo Pounds, NP  ezetimibe (ZETIA) 10 MG tablet TAKE 1 TABLET (10 MG TOTAL) BY MOUTH DAILY. 07/02/20 07/02/21  Gildardo Pounds, NP  glucose blood (TRUE METRIX BLOOD GLUCOSE TEST) test strip Use as instructed 11/05/18   Gildardo Pounds, NP  insulin glargine (LANTUS SOLOSTAR) 100 UNIT/ML Solostar Pen Inject 60 Units into the skin daily. 07/24/20 10/07/20  Charlott Rakes, MD  Insulin Pen Needle 32G X 4 MM MISC USE AS DIRECTED 03/06/20 03/06/21  Gildardo Pounds, NP  liraglutide (VICTOZA) 18 MG/3ML SOPN INJECT 1.8 MG INTO THE SKIN DAILY. 11/04/19 11/03/20  Gildardo Pounds, NP  lisinopril (ZESTRIL) 5 MG tablet TAKE 1 TABLET (5 MG TOTAL) BY MOUTH DAILY. 11/05/19 11/04/20  Gildardo Pounds, NP  metFORMIN (GLUCOPHAGE) 1000 MG tablet TAKE 1 TABLET (1,000 MG TOTAL) BY MOUTH 2 (TWO) TIMES DAILY WITH A MEAL. 06/18/20 06/18/21  Argentina Donovan, PA-C  Misc. Devices (CAREX COCCYX CUSHION) MISC Use daily when sitting for pressure relief ICD10 L98.429 07/02/20   Gildardo Pounds, NP  Misc. Devices MISC Please provide patient with standard sized 31F urinary catheters for self cathing along with lubricant. 05/29/18   Gildardo Pounds, NP  omega-3 acid ethyl esters (LOVAZA) 1 g capsule TAKE 2 CAPSULES (2 G TOTAL) BY MOUTH 2 (TWO) TIMES DAILY. 06/18/20 06/18/21  Argentina Donovan, PA-C  sulfamethoxazole-trimethoprim (BACTRIM DS) 800-160 MG tablet Take 1 tablet by mouth 2 (two) times daily for 7 days. 08/22/20 08/29/20  Raspet, Derry Skill, PA-C  tamsulosin (FLOMAX) 0.4 MG  CAPS capsule Take 2 capsules (0.8 mg total) by mouth daily after supper. 06/15/20   Tyler Pita, MD  TRUEplus Lancets 28G MISC 1 each by Does not apply route 4 (four) times daily - after meals and at bedtime. 11/05/18   Gildardo Pounds, NP  gemfibrozil (LOPID) 600 MG tablet Take 1 tablet (600 mg total) by mouth 2 (two) times daily before a meal. 07/20/19 11/04/19  Gildardo Pounds, NP    Allergies    Lipitor [atorvastatin]  Review of Systems   Review of Systems  Genitourinary:  Positive for hematuria.  All other systems reviewed and are negative except that which was mentioned in HPI  Physical Exam Updated Vital Signs BP 139/85 (BP Location: Right Arm)   Pulse 88   Temp 98.4 F (36.9 C) (Oral)   Resp 18   SpO2 100%   Physical Exam Constitutional:  General: He is not in acute distress.    Appearance: Normal appearance.  HENT:     Head: Normocephalic and atraumatic.  Eyes:     Conjunctiva/sclera: Conjunctivae normal.  Cardiovascular:     Rate and Rhythm: Normal rate and regular rhythm.     Heart sounds: Normal heart sounds. No murmur heard. Pulmonary:     Effort: Pulmonary effort is normal.     Breath sounds: Normal breath sounds.  Abdominal:     General: Abdomen is flat. There is no distension.     Palpations: Abdomen is soft.     Tenderness: There is no abdominal tenderness.  Musculoskeletal:     Right lower leg: No edema.     Left lower leg: No edema.  Skin:    General: Skin is warm and dry.  Neurological:     Mental Status: He is alert and oriented to person, place, and time.     Comments: fluent  Psychiatric:        Mood and Affect: Mood normal.        Behavior: Behavior normal.    ED Results / Procedures / Treatments   Labs (all labs ordered are listed, but only abnormal results are displayed) Labs Reviewed  URINALYSIS, ROUTINE W REFLEX MICROSCOPIC    EKG None  Radiology No results found.  Procedures Procedures   Medications Ordered in  ED Medications - No data to display  ED Course  I have reviewed the triage vital signs and the nursing notes.  Pertinent labs & imaging results that were available during my care of the patient were reviewed by me and considered in my medical decision making (see chart for details).    MDM Rules/Calculators/A&P                          Comfortable on exam, no complaints whatsoever, no abdominal pain and no flank pain to suggest kidney stones.  I reviewed his work-up this morning which showed normal creatinine, WBC 3.6, hemoglobin 12.1, UA with hematuria.  Urine culture was sent this morning therefore did not send another.  I reviewed his urine culture from a few days ago which shows susceptibility to Bactrim.  Because he is otherwise asymptomatic, I do not feel his hematuria represents treatment failure.  I have encouraged him to contact his urologist for follow-up as he may need outpatient work-up including cystoscopy.  Have instructed to finish antibiotics as prescribed.  I have extensively reviewed return precautions including inability to empty bladder, abdominal or flank pain, or fever.  Patient voiced understanding. Final Clinical Impression(s) / ED Diagnoses Final diagnoses:  Painless hematuria    Rx / DC Orders ED Discharge Orders     None        Elbert Polyakov, Wenda Overland, MD 08/26/20 2136

## 2020-08-26 NOTE — ED Notes (Signed)
No drains to kidneys present.  No foley present.

## 2020-08-26 NOTE — ED Notes (Signed)
Pt seen in this ED this AM for same, told to follow-up with PCP but follow-up appointment was set for a week out.  Pt completed radiation treatments for prostate cancer a month ago but has not had any symptoms until this AM.  UA completed this AM during first visit.

## 2020-08-27 LAB — URINE CULTURE: Culture: NO GROWTH

## 2020-08-31 ENCOUNTER — Other Ambulatory Visit: Payer: Self-pay

## 2020-09-01 ENCOUNTER — Other Ambulatory Visit: Payer: Self-pay

## 2020-09-01 MED FILL — Insulin Pen Needle 32 G X 4 MM (1/6" or 5/32"): 25 days supply | Qty: 100 | Fill #1 | Status: AC

## 2020-09-18 ENCOUNTER — Other Ambulatory Visit: Payer: Self-pay

## 2020-09-18 ENCOUNTER — Other Ambulatory Visit: Payer: Self-pay | Admitting: Nurse Practitioner

## 2020-09-18 MED ORDER — DAPAGLIFLOZIN PROPANEDIOL 10 MG PO TABS
10.0000 mg | ORAL_TABLET | Freq: Every day | ORAL | 2 refills | Status: DC
Start: 2020-09-18 — End: 2020-11-27
  Filled 2020-09-18 – 2020-09-29 (×2): qty 30, 30d supply, fill #0
  Filled 2020-10-29: qty 30, 30d supply, fill #1

## 2020-09-18 MED FILL — Liraglutide Soln Pen-injector 18 MG/3ML (6 MG/ML): SUBCUTANEOUS | 30 days supply | Qty: 9 | Fill #1 | Status: AC

## 2020-09-18 NOTE — Telephone Encounter (Signed)
   Notes to clinic:  This request has changes from the previous prescription Review for refill   Requested Prescriptions  Pending Prescriptions Disp Refills   dapagliflozin propanediol (FARXIGA) 10 MG TABS tablet 90 tablet 0    Sig: Take 1 tablet (10 mg total) by mouth daily before breakfast.      Endocrinology:  Diabetes - SGLT2 Inhibitors Passed - 09/18/2020  9:50 AM      Passed - Cr in normal range and within 360 days    Creat  Date Value Ref Range Status  05/28/2015 0.84 0.70 - 1.33 mg/dL Final   Creatinine, Ser  Date Value Ref Range Status  08/26/2020 0.87 0.61 - 1.24 mg/dL Final          Passed - LDL in normal range and within 360 days    LDL Chol Calc (NIH)  Date Value Ref Range Status  07/02/2020 87 0 - 99 mg/dL Final   Direct LDL  Date Value Ref Range Status  11/09/2009 87.7 mg/dL Final    Comment:    See lab report for associated comment(s)          Passed - HBA1C is between 0 and 7.9 and within 180 days    HbA1c, POC (controlled diabetic range)  Date Value Ref Range Status  07/02/2020 7.3 (A) 0.0 - 7.0 % Final          Passed - eGFR in normal range and within 360 days    GFR calc Af Amer  Date Value Ref Range Status  02/03/2020 88 >59 mL/min/1.73 Final    Comment:    **In accordance with recommendations from the NKF-ASN Task force,**   [facility] is in the process of updating its eGFR calculation to the   2021 CKD-EPI creatinine equation that estimates kidney function   without a race variable.    GFR, Estimated  Date Value Ref Range Status  08/26/2020 >60 >60 mL/min Final    Comment:    (NOTE) Calculated using the CKD-EPI Creatinine Equation (2021)    eGFR  Date Value Ref Range Status  07/02/2020 62 >59 mL/min/1.73 Final          Passed - Valid encounter within last 6 months    Recent Outpatient Visits           2 months ago Uncontrolled type 2 diabetes mellitus with hyperglycemia [facility])   [place]  [place], [place] W, NP   3 months ago UTI symptoms   [place], [place] M, [place]   7 months ago Uncontrolled type 2 diabetes mellitus with hyperglycemia, with long-term current use of insulin [facility])   [place], [place] W, NP   10 months ago Uncontrolled type 2 diabetes mellitus with hyperglycemia, with long-term current use of insulin [facility])   [place], [place] W, NP   1 year ago Uncontrolled type 2 diabetes mellitus with hyperglycemia, with long-term current use of insulin [facility])   [place], [place] Buff, NP

## 2020-09-29 ENCOUNTER — Other Ambulatory Visit: Payer: Self-pay

## 2020-09-29 MED FILL — Lisinopril Tab 5 MG: ORAL | 30 days supply | Qty: 30 | Fill #4 | Status: AC

## 2020-10-21 ENCOUNTER — Other Ambulatory Visit: Payer: Self-pay | Admitting: Physician Assistant

## 2020-10-21 ENCOUNTER — Other Ambulatory Visit: Payer: Self-pay

## 2020-10-21 DIAGNOSIS — E1165 Type 2 diabetes mellitus with hyperglycemia: Secondary | ICD-10-CM

## 2020-10-21 MED ORDER — METFORMIN HCL 1000 MG PO TABS
ORAL_TABLET | Freq: Two times a day (BID) | ORAL | 0 refills | Status: DC
  Filled 2020-10-21: qty 60, 30d supply, fill #0

## 2020-10-21 MED FILL — Liraglutide Soln Pen-injector 18 MG/3ML (6 MG/ML): SUBCUTANEOUS | 30 days supply | Qty: 9 | Fill #2 | Status: AC

## 2020-10-21 NOTE — Telephone Encounter (Signed)
Attempted to call patient- due office follow up- courtesy 30 day RF given

## 2020-10-22 ENCOUNTER — Other Ambulatory Visit: Payer: Self-pay

## 2020-10-29 ENCOUNTER — Other Ambulatory Visit: Payer: Self-pay

## 2020-10-29 MED FILL — Insulin Pen Needle 32 G X 4 MM (1/6" or 5/32"): 25 days supply | Qty: 100 | Fill #2 | Status: AC

## 2020-10-29 MED FILL — Lisinopril Tab 5 MG: ORAL | 30 days supply | Qty: 30 | Fill #5 | Status: AC

## 2020-11-02 ENCOUNTER — Other Ambulatory Visit: Payer: Self-pay

## 2020-11-23 ENCOUNTER — Other Ambulatory Visit: Payer: Self-pay

## 2020-11-23 ENCOUNTER — Other Ambulatory Visit: Payer: Self-pay | Admitting: Nurse Practitioner

## 2020-11-23 DIAGNOSIS — E1165 Type 2 diabetes mellitus with hyperglycemia: Secondary | ICD-10-CM

## 2020-11-23 NOTE — Telephone Encounter (Signed)
Requested medications are due for refill today yes  Requested medications are on the active medication list yes  Last refill 9/1  Last visit 07/02/20  Future visit scheduled no  Notes to clinic Has already had a curtesy refill and there is no upcoming appointment scheduled.

## 2020-11-24 ENCOUNTER — Other Ambulatory Visit: Payer: Self-pay

## 2020-11-25 ENCOUNTER — Other Ambulatory Visit: Payer: Self-pay

## 2020-11-26 ENCOUNTER — Other Ambulatory Visit: Payer: Self-pay

## 2020-11-27 ENCOUNTER — Other Ambulatory Visit: Payer: Self-pay

## 2020-11-27 ENCOUNTER — Ambulatory Visit: Payer: Medicaid Other | Attending: Nurse Practitioner | Admitting: Nurse Practitioner

## 2020-11-27 ENCOUNTER — Encounter: Payer: Self-pay | Admitting: Nurse Practitioner

## 2020-11-27 VITALS — BP 116/76 | HR 78 | Ht 73.0 in | Wt 239.4 lb

## 2020-11-27 DIAGNOSIS — E669 Obesity, unspecified: Secondary | ICD-10-CM | POA: Insufficient documentation

## 2020-11-27 DIAGNOSIS — Z7984 Long term (current) use of oral hypoglycemic drugs: Secondary | ICD-10-CM | POA: Diagnosis not present

## 2020-11-27 DIAGNOSIS — Z7985 Long-term (current) use of injectable non-insulin antidiabetic drugs: Secondary | ICD-10-CM | POA: Diagnosis not present

## 2020-11-27 DIAGNOSIS — I2510 Atherosclerotic heart disease of native coronary artery without angina pectoris: Secondary | ICD-10-CM | POA: Diagnosis not present

## 2020-11-27 DIAGNOSIS — N319 Neuromuscular dysfunction of bladder, unspecified: Secondary | ICD-10-CM | POA: Insufficient documentation

## 2020-11-27 DIAGNOSIS — L989 Disorder of the skin and subcutaneous tissue, unspecified: Secondary | ICD-10-CM | POA: Insufficient documentation

## 2020-11-27 DIAGNOSIS — Z6831 Body mass index (BMI) 31.0-31.9, adult: Secondary | ICD-10-CM | POA: Diagnosis not present

## 2020-11-27 DIAGNOSIS — Z79899 Other long term (current) drug therapy: Secondary | ICD-10-CM | POA: Insufficient documentation

## 2020-11-27 DIAGNOSIS — Z960 Presence of urogenital implants: Secondary | ICD-10-CM | POA: Insufficient documentation

## 2020-11-27 DIAGNOSIS — Z7982 Long term (current) use of aspirin: Secondary | ICD-10-CM | POA: Insufficient documentation

## 2020-11-27 DIAGNOSIS — H6192 Disorder of left external ear, unspecified: Secondary | ICD-10-CM | POA: Diagnosis not present

## 2020-11-27 DIAGNOSIS — Z794 Long term (current) use of insulin: Secondary | ICD-10-CM | POA: Diagnosis not present

## 2020-11-27 DIAGNOSIS — Z888 Allergy status to other drugs, medicaments and biological substances status: Secondary | ICD-10-CM | POA: Insufficient documentation

## 2020-11-27 DIAGNOSIS — E1159 Type 2 diabetes mellitus with other circulatory complications: Secondary | ICD-10-CM

## 2020-11-27 DIAGNOSIS — D649 Anemia, unspecified: Secondary | ICD-10-CM | POA: Diagnosis not present

## 2020-11-27 DIAGNOSIS — I252 Old myocardial infarction: Secondary | ICD-10-CM | POA: Diagnosis not present

## 2020-11-27 DIAGNOSIS — E1165 Type 2 diabetes mellitus with hyperglycemia: Secondary | ICD-10-CM | POA: Diagnosis present

## 2020-11-27 DIAGNOSIS — Z87442 Personal history of urinary calculi: Secondary | ICD-10-CM | POA: Diagnosis not present

## 2020-11-27 DIAGNOSIS — Z8249 Family history of ischemic heart disease and other diseases of the circulatory system: Secondary | ICD-10-CM | POA: Diagnosis not present

## 2020-11-27 DIAGNOSIS — Z833 Family history of diabetes mellitus: Secondary | ICD-10-CM | POA: Insufficient documentation

## 2020-11-27 DIAGNOSIS — N400 Enlarged prostate without lower urinary tract symptoms: Secondary | ICD-10-CM | POA: Diagnosis not present

## 2020-11-27 DIAGNOSIS — Z8546 Personal history of malignant neoplasm of prostate: Secondary | ICD-10-CM | POA: Diagnosis not present

## 2020-11-27 DIAGNOSIS — Z23 Encounter for immunization: Secondary | ICD-10-CM

## 2020-11-27 DIAGNOSIS — Z955 Presence of coronary angioplasty implant and graft: Secondary | ICD-10-CM | POA: Insufficient documentation

## 2020-11-27 DIAGNOSIS — Z8673 Personal history of transient ischemic attack (TIA), and cerebral infarction without residual deficits: Secondary | ICD-10-CM | POA: Diagnosis not present

## 2020-11-27 DIAGNOSIS — E785 Hyperlipidemia, unspecified: Secondary | ICD-10-CM | POA: Insufficient documentation

## 2020-11-27 DIAGNOSIS — I10 Essential (primary) hypertension: Secondary | ICD-10-CM | POA: Diagnosis not present

## 2020-11-27 DIAGNOSIS — I152 Hypertension secondary to endocrine disorders: Secondary | ICD-10-CM

## 2020-11-27 LAB — POCT GLYCOSYLATED HEMOGLOBIN (HGB A1C): Hemoglobin A1C: 7 % — AB (ref 4.0–5.6)

## 2020-11-27 LAB — GLUCOSE, POCT (MANUAL RESULT ENTRY): POC Glucose: 249 mg/dl — AB (ref 70–99)

## 2020-11-27 MED ORDER — DAPAGLIFLOZIN PROPANEDIOL 10 MG PO TABS
10.0000 mg | ORAL_TABLET | Freq: Every day | ORAL | 0 refills | Status: DC
  Filled 2020-11-27: qty 90, 90d supply, fill #0

## 2020-11-27 MED ORDER — METFORMIN HCL 1000 MG PO TABS
1000.0000 mg | ORAL_TABLET | Freq: Two times a day (BID) | ORAL | 1 refills | Status: DC
  Filled 2020-11-27 – 2021-03-02 (×2): qty 180, 90d supply, fill #0

## 2020-11-27 MED ORDER — LANTUS SOLOSTAR 100 UNIT/ML ~~LOC~~ SOPN
60.0000 [IU] | PEN_INJECTOR | Freq: Every day | SUBCUTANEOUS | 2 refills | Status: DC
  Filled 2020-11-27: qty 15, 25d supply, fill #0
  Filled 2020-12-21: qty 15, 25d supply, fill #1
  Filled 2021-01-13: qty 15, 25d supply, fill #2

## 2020-11-27 MED ORDER — LISINOPRIL 5 MG PO TABS
ORAL_TABLET | Freq: Every day | ORAL | 3 refills | Status: DC
  Filled 2020-11-27 – 2021-03-02 (×2): qty 90, 90d supply, fill #0

## 2020-11-27 NOTE — Progress Notes (Signed)
Assessment & Plan:  Shane Bond was seen today for diabetes.  Diagnoses and all orders for this visit:  Uncontrolled type 2 diabetes mellitus with hyperglycemia ([place]) -     POCT glucose (manual entry) -     POCT glycosylated hemoglobin (Hb A1C) -     dapagliflozin propanediol (FARXIGA) 10 MG TABS tablet; Take 1 tablet (10 mg total) by mouth daily before breakfast. -     insulin glargine (LANTUS SOLOSTAR) 100 UNIT/ML Solostar Pen; Inject 60 Units into the skin daily. -     lisinopril (ZESTRIL) 5 MG tablet; TAKE 1 TABLET (5 MG TOTAL) BY MOUTH DAILY. -     metFORMIN (GLUCOPHAGE) 1000 MG tablet; Take 1 tablet (1,000 mg total) by mouth 2 (two) times daily with a meal. -     CMP14+EGFR Continue blood sugar control as discussed in office today, low carbohydrate diet, and regular physical exercise as tolerated, 150 minutes per week (30 min each day, 5 days per week, or 50 min 3 days per week). Keep blood sugar logs with fasting goal of 90-130 mg/dl, post prandial (after you eat) less than 180.  For Hypoglycemia: BS <60 and Hyperglycemia BS >400; contact the clinic ASAP. Annual eye exams and foot exams are recommended.   Hypertension associated with diabetes ([place]) Continue all antihypertensives as prescribed.  Remember to bring in your blood pressure log with you for your follow up appointment.  DASH/Mediterranean Diets are healthier choices for HTN.    Anemia, unspecified type -     CBC -     Iron, TIBC and Ferritin Panel  Need for immunization against influenza -     Flu Vaccine QUAD 99moIM (Fluarix, Fluzone & Alfiuria Quad PF)  Skin lesion of left ear -     Ambulatory referral to Dermatology   Patient has been counseled on age-appropriate routine health concerns for screening and prevention. These are reviewed and up-to-date. Referrals have been placed accordingly. Immunizations are up-to-date or declined.    Subjective:   Chief Complaint  Patient presents with   Diabetes    HPI Shane [id number].o. male presents to office today for follow up to DM He is currently being followed by Urology for prostate cancer.  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   DM Monitoring blood glucose levels twice a day. Reports readings as "normal".  Currently taking metformin 1000 mg twice daily Farxiga 10 mg daily, Lantus 60 and Victoza 1.8 mg daily.  He is on renal dose ACE and taking Zetia and omega-3 for hyperlipidemia. Blood pressure is well controlled.  Lab Results  Component Value Date   HGBA1C 7.0 (A) 11/27/2020    Lab Results  Component Value Date   HGBA1C 7.3 (A) 07/02/2020    Lab Results  Component Value Date   LDLCALC 87 07/02/2020   BP Readings from Last 3 Encounters:  11/27/20 116/76  08/26/20 139/85  08/26/20 (!) 116/57     Skin Lesion Hyperpigmented skin lesion on left ear with changes to skin texture. He is unaware of how long the lesion has been present. He has a history of prostate cancer  Review of Systems  Constitutional:  Negative for fever, malaise/fatigue and weight loss.  HENT: Negative.  Negative for  nosebleeds.   Eyes: Negative.  Negative for blurred vision, double vision and photophobia.  Respiratory: Negative.  Negative for cough and shortness of breath.   Cardiovascular: Negative.  Negative for chest pain, palpitations and leg swelling.  Gastrointestinal: Negative.  Negative for heartburn, nausea and vomiting.  Musculoskeletal: Negative.  Negative for myalgias.  Neurological: Negative.  Negative for dizziness, focal weakness, seizures and headaches.  Psychiatric/Behavioral: Negative.  Negative for suicidal ideas.    Past  Medical History:  Diagnosis Date   AMI, INFERIOR WALL 10/22/2009   Anemia    Bladder atonia    BPH (benign prostatic hyperplasia)    CAD, NATIVE VESSEL 10/29/2009   Diabetes mellitus type 2, uncontrolled, with complications    Hyperlipidemia LDL goal <70    Hypertension    Kidney stone    Neurogenic bladder as late effect of cerebrovascular accident (CVA)    OBESITY 10/22/2009   Prostate cancer [facility])     Past Surgical History:  Procedure Laterality Date   BACK SURGERY     CORONARY STENT PLACEMENT     CYSTOSCOPY W/ URETERAL STENT PLACEMENT  02/26/2012   Procedure: CYSTOSCOPY WITH RETROGRADE PYELOGRAM/URETERAL STENT PLACEMENT;  Surgeon: Bernestine Amass, MD;  Location: [facility];  Service: Urology;  Laterality: Left;   CYSTOSCOPY WITH RETROGRADE PYELOGRAM, URETEROSCOPY AND STENT PLACEMENT Left 08/07/2019   Procedure: CYSTOSCOPY WITH LEFT RETROGRADE PYELOGRAM, URETEROSCOPY HOLMIUM LASER AND STENT PLACEMENT;  Surgeon: Ardis Hughs, MD;  Location: [facility];  Service: Urology;  Laterality: Left;   CYSTOSCOPY WITH STENT PLACEMENT Left 06/24/2019   Procedure: CYSTOSCOPY WITH STENT PLACEMENT LEFT URETER WITH LEFT RETROGRADE URETERAL;  Surgeon: Ceasar Mons, MD;  Location: [facility];  Service: Urology;  Laterality: Left;   testicles Right     Family History  Problem Relation Age of Onset   Diabetes Father    Coronary artery disease Other    Heart attack Other    Diabetes Paternal Aunt    Stomach cancer Maternal Uncle    Breast cancer Neg Hx    Colon cancer Neg Hx    Pancreatic cancer Neg Hx    Prostate cancer Neg Hx    Esophageal cancer Neg Hx    Liver disease Neg Hx     Social History Reviewed with no changes to be made today.   Outpatient Medications Prior to Visit  Medication Sig Dispense Refill   Aspirin 81 MG CAPS Take 1 tablet by mouth daily.     ezetimibe (ZETIA) 10 MG tablet TAKE 1 TABLET (10 MG TOTAL) BY MOUTH DAILY. 90 tablet 3   glucose blood (TRUE METRIX BLOOD  GLUCOSE TEST) test strip Use as instructed 100 each 12   Insulin Pen Needle 32G X 4 MM MISC USE AS DIRECTED 100 each 12   Misc. Devices (CAREX COCCYX CUSHION) MISC Use daily when sitting for pressure relief ICD10 L98.429 1 each prn   Misc. Devices MISC Please provide patient with standard sized 73F urinary catheters for self cathing along with lubricant. 50 each prn   omega-3 acid ethyl esters (LOVAZA) 1 g capsule TAKE 2 CAPSULES (2 G TOTAL) BY MOUTH 2 (TWO) TIMES DAILY. 360 capsule 2   TRUEplus Lancets 28G MISC 1 each by Does not apply route 4 (four) times daily - after meals and at bedtime. 100 each 12   dapagliflozin propanediol (FARXIGA) 10 MG TABS tablet Take 1 tablet (10 mg total) by mouth daily before breakfast. 30 tablet 2   lisinopril (ZESTRIL) 5  MG tablet TAKE 1 TABLET (5 MG TOTAL) BY MOUTH DAILY. 90 tablet 3   metFORMIN (GLUCOPHAGE) 1000 MG tablet TAKE 1 TABLET (1,000 MG TOTAL) BY MOUTH 2 (TWO) TIMES DAILY WITH A MEAL. 60 tablet 0   liraglutide (VICTOZA) 18 MG/3ML SOPN INJECT 1.8 MG INTO THE SKIN DAILY. 9 mL 6   tamsulosin (FLOMAX) 0.4 MG CAPS capsule Take 2 capsules (0.8 mg total) by mouth daily after supper. (Patient not taking: Reported on 11/27/2020) 30 capsule 5   insulin glargine (LANTUS SOLOSTAR) 100 UNIT/ML Solostar Pen Inject 60 Units into the skin daily. 15 mL 2   No facility-administered medications prior to visit.    Allergies  Allergen Reactions   Lipitor [Atorvastatin] Rash       Objective:    BP 116/76   Pulse 78   Ht '6\' 1"'  (1.854 m)   Wt 239 lb 6 oz (108.6 kg)   SpO2 97%   BMI 31.58 kg/m  Wt Readings from Last 3 Encounters:  11/27/20 239 lb 6 oz (108.6 kg)  08/26/20 251 lb 1.7 oz (113.9 kg)  07/02/20 251 lb (113.9 kg)    Physical Exam Vitals and nursing note reviewed.  Constitutional:      Appearance: He is well-developed.  HENT:     Head: Normocephalic and atraumatic.  Cardiovascular:     Rate and Rhythm: Normal rate and regular rhythm.      Heart sounds: Normal heart sounds. No murmur heard.   No friction rub. No gallop.  Pulmonary:     Effort: Pulmonary effort is normal. No tachypnea or respiratory distress.     Breath sounds: Normal breath sounds. No decreased breath sounds, wheezing, rhonchi or rales.  Chest:     Chest wall: No tenderness.  Abdominal:     General: Bowel sounds are normal.     Palpations: Abdomen is soft.  Musculoskeletal:        General: Normal range of motion.     Cervical back: Normal range of motion.  Skin:    General: Skin is warm and dry.  Neurological:     Mental Status: He is alert and oriented to person, place, and time.     Coordination: Coordination normal.  Psychiatric:        Behavior: Behavior normal. Behavior is cooperative.        Thought Content: Thought content normal.        Judgment: Judgment normal.         Patient has been counseled extensively about nutrition and exercise as well as the importance of adherence with medications and regular follow-up. The patient was given clear instructions to go to ER or return to medical center if symptoms don't improve, worsen or new problems develop. The patient verbalized understanding.   Follow-up: Return in about 3 months (around 02/27/2021).   Gildardo Pounds, FNP-BC [facility] [place], [place]   11/27/2020, 1:51 PM

## 2020-11-28 LAB — IRON,TIBC AND FERRITIN PANEL
Ferritin: 445 ng/mL — ABNORMAL HIGH (ref 30–400)
Iron Saturation: 28 % (ref 15–55)
Iron: 98 ug/dL (ref 38–169)
Total Iron Binding Capacity: 345 ug/dL (ref 250–450)
UIBC: 247 ug/dL (ref 111–343)

## 2020-11-28 LAB — CMP14+EGFR
ALT: 57 IU/L — ABNORMAL HIGH (ref 0–44)
AST: 38 IU/L (ref 0–40)
Albumin/Globulin Ratio: 1.3 (ref 1.2–2.2)
Albumin: 4.4 g/dL (ref 3.8–4.9)
Alkaline Phosphatase: 95 IU/L (ref 44–121)
BUN/Creatinine Ratio: 17 (ref 9–20)
BUN: 17 mg/dL (ref 6–24)
Bilirubin Total: 0.3 mg/dL (ref 0.0–1.2)
CO2: 21 mmol/L (ref 20–29)
Calcium: 10.1 mg/dL (ref 8.7–10.2)
Chloride: 102 mmol/L (ref 96–106)
Creatinine, Ser: 0.99 mg/dL (ref 0.76–1.27)
Globulin, Total: 3.3 g/dL (ref 1.5–4.5)
Glucose: 226 mg/dL — ABNORMAL HIGH (ref 70–99)
Potassium: 4.7 mmol/L (ref 3.5–5.2)
Sodium: 140 mmol/L (ref 134–144)
Total Protein: 7.7 g/dL (ref 6.0–8.5)
eGFR: 88 mL/min/{1.73_m2} (ref >59)

## 2020-11-28 LAB — CBC
Hematocrit: 40.6 % (ref 37.5–51.0)
Hemoglobin: 13.8 g/dL (ref 13.0–17.7)
MCH: 32.2 pg (ref 26.6–33.0)
MCHC: 34 g/dL (ref 31.5–35.7)
MCV: 95 fL (ref 79–97)
Platelets: 214 10*3/uL (ref 150–450)
RBC: 4.28 x10E6/uL (ref 4.14–5.80)
RDW: 13 % (ref 11.6–15.4)
WBC: 4.6 10*3/uL (ref 3.4–10.8)

## 2020-12-01 ENCOUNTER — Other Ambulatory Visit: Payer: Self-pay

## 2020-12-03 ENCOUNTER — Telehealth: Payer: Self-pay

## 2020-12-03 ENCOUNTER — Telehealth: Payer: Self-pay | Admitting: *Deleted

## 2020-12-03 NOTE — Telephone Encounter (Signed)
Pt returned call for lab results. Result note read, pt verbalizes understanding.

## 2020-12-03 NOTE — Telephone Encounter (Signed)
Phone call

## 2020-12-03 NOTE — Telephone Encounter (Signed)
Noted  

## 2020-12-08 IMAGING — CT CT ABD-PELV W/O CM
2 of 4 series · 12 of 36 positions shown, 15 images · non-contrast
Comparison: None.

CLINICAL DATA: Sepsis.

EXAM:
CT CHEST, ABDOMEN AND PELVIS WITHOUT CONTRAST
TECHNIQUE: Multidetector CT imaging of the chest, abdomen and pelvis was
performed following the standard protocol without IV contrast.

[Series 2: cap w/o · axial · non-contrast · 0.98mm/px · z∈[-420,+170]mm · 9 of 146 slices shown, 12 images]
[im 14/146  mediastinal]
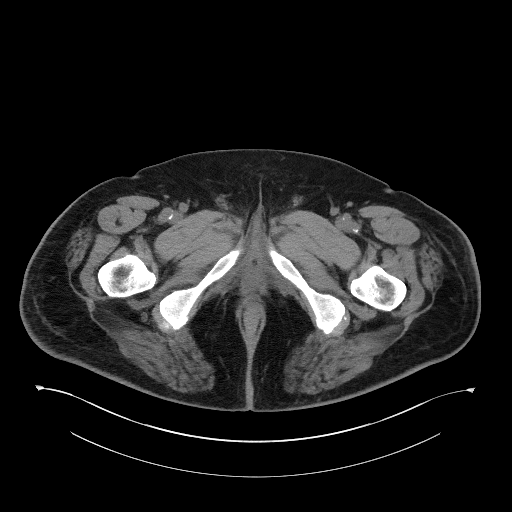
[im 14/146  lung]
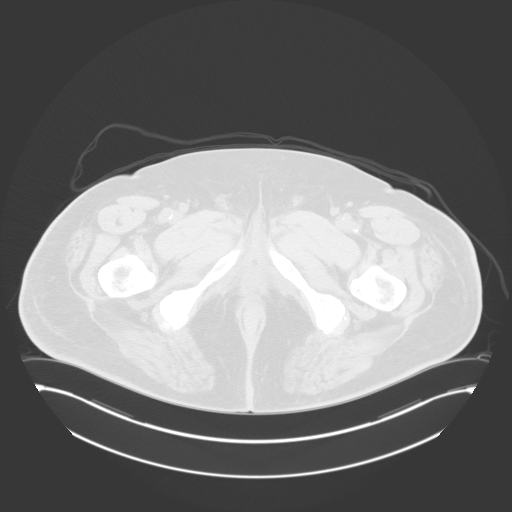
[im 27/146  lung]
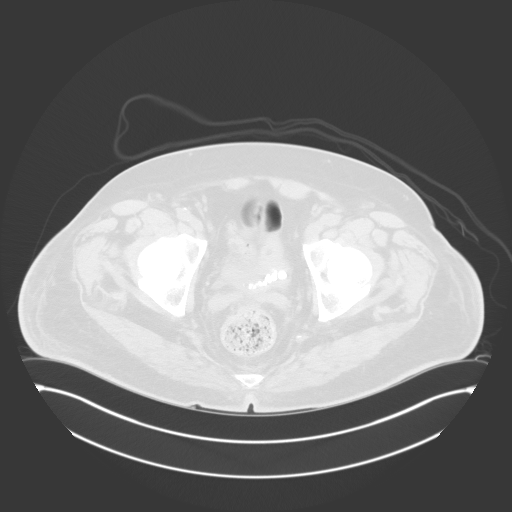
[im 40/146  lung]
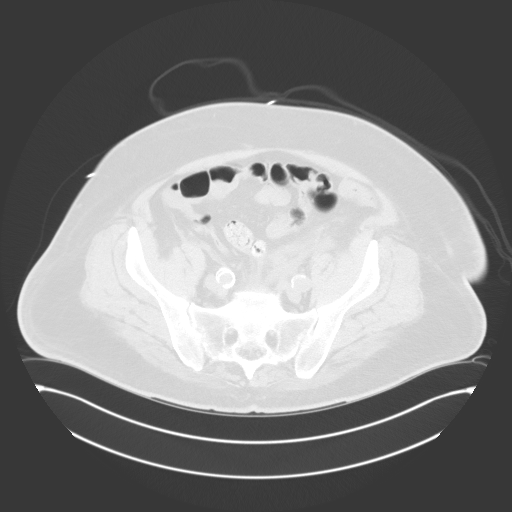
[im 53/146  lung]
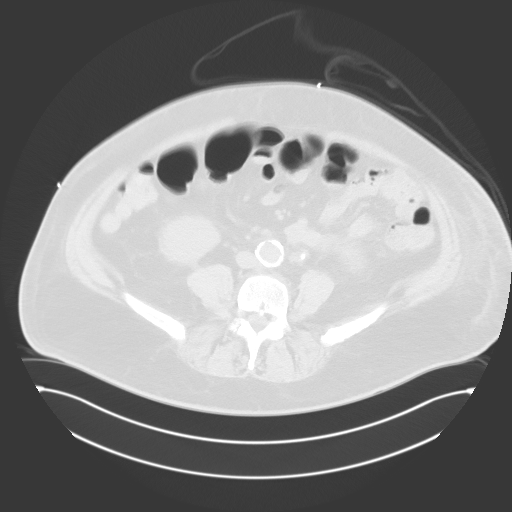
[im 80/146  mediastinal]
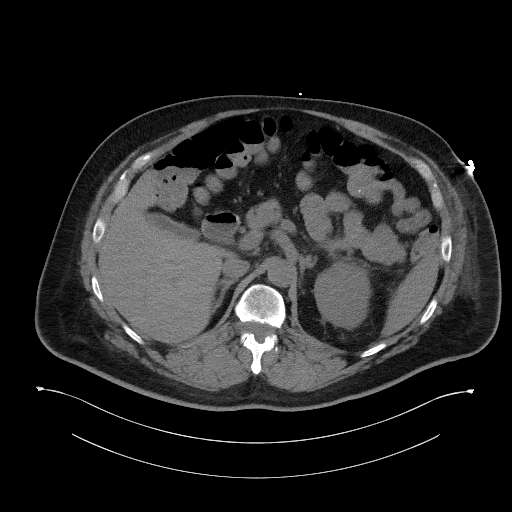
[im 80/146  lung]
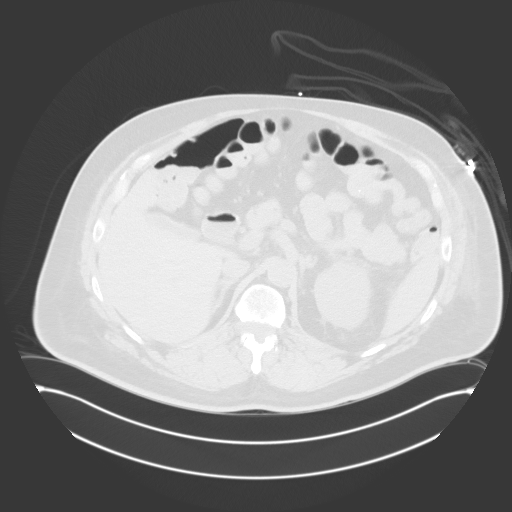
[im 93/146  lung]
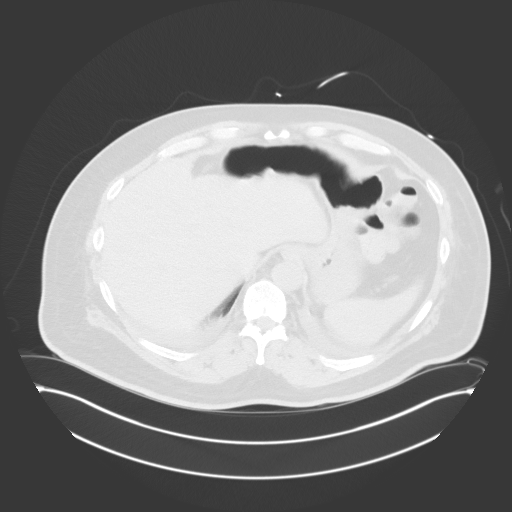
[im 106/146  lung]
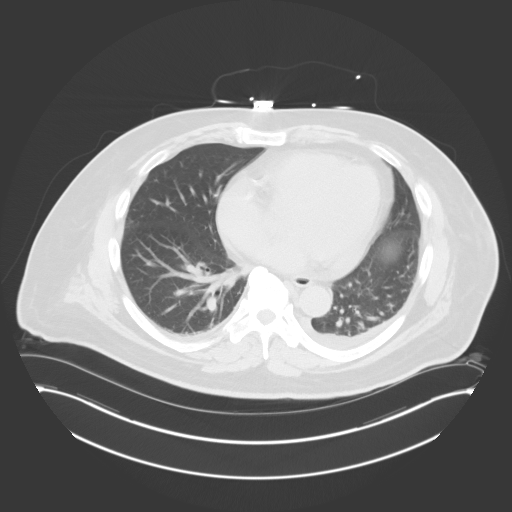
[im 119/146  lung]
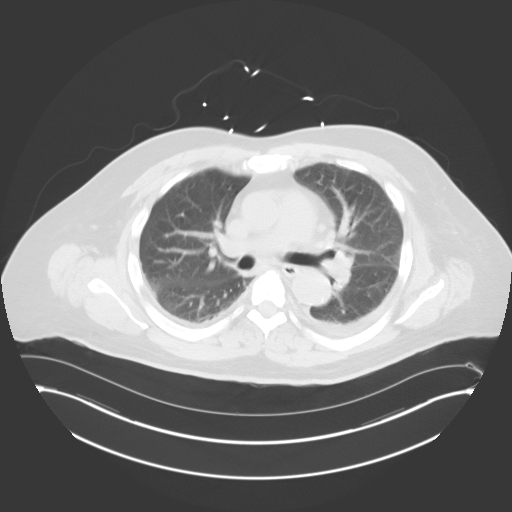
[im 132/146  mediastinal]
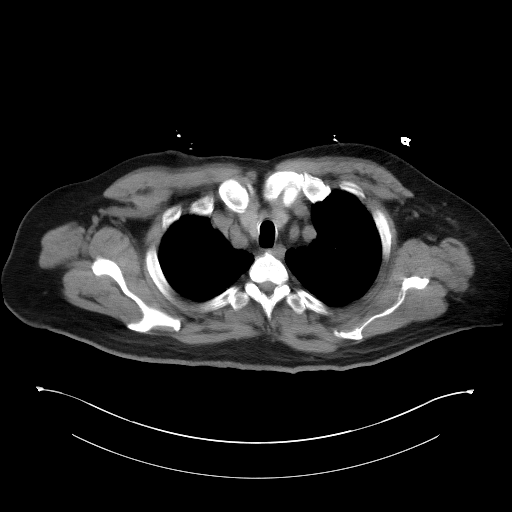
[im 132/146  lung]
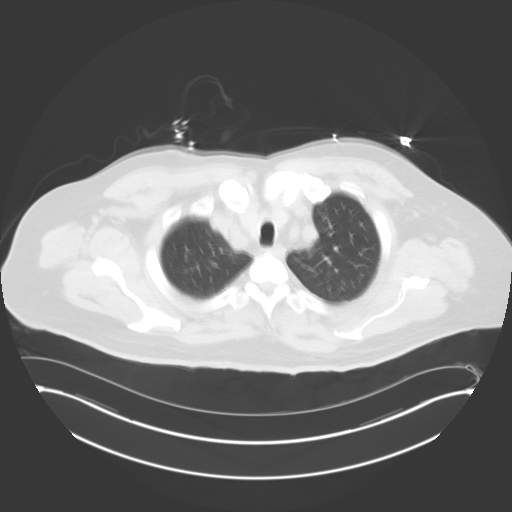

[Series 4: coronals · coronal · 1.01mm/px · 3 of 154 slices shown]
[im 31/154  lung]
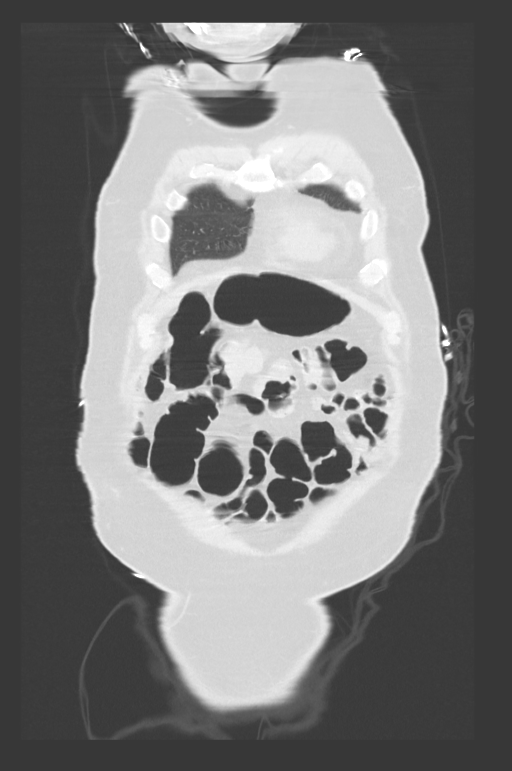
[im 62/154  lung]
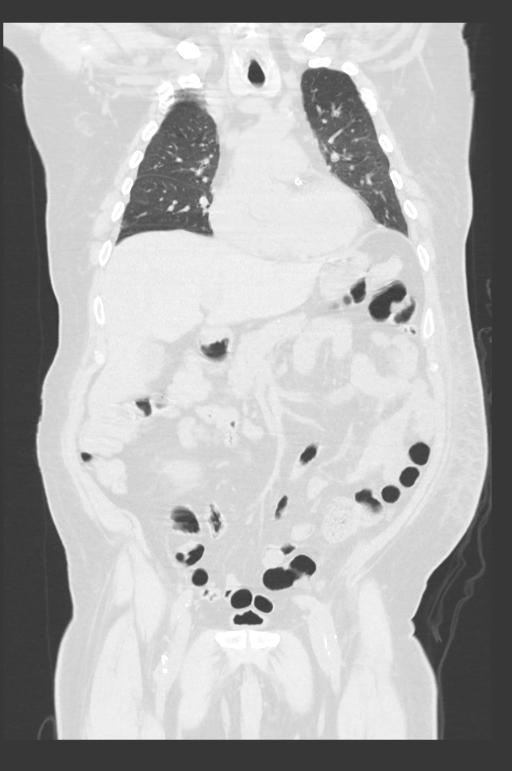
[im 92/154  lung]
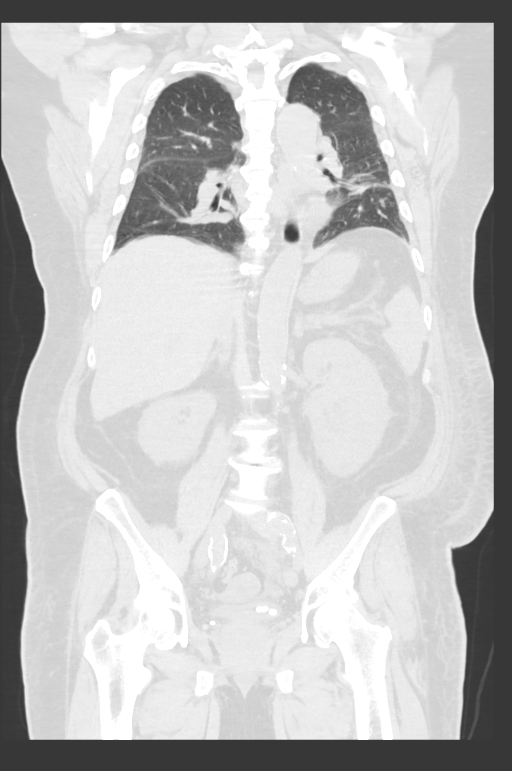

[12 of 36 positions shown; findings below may reference images not displayed]

Chest radiograph earlier today and yesterday. Noncontrast abdominal
CT 02/19/2019
FINDINGS: CT CHEST FINDINGS

Cardiovascular: Atherosclerosis of the thoracic aorta. No aortic
aneurysm. Mild aortic tortuosity. Coronary artery calcifications.
Borderline mild cardiomegaly. Small pericardial effusion measuring
up to 13 mm adjacent to the left atrium.

Mediastinum/Nodes: Lack of contrast and patient motion limits
detailed assessment for adenopathy. Allowing for these limitations,
no bulky mediastinal or gross hilar adenopathy. No esophageal wall
thickening. No visualized thyroid nodule.

Lungs/Pleura: Small bilateral pleural effusions. Streaky opacities
in the left lower lobe consistent with atelectasis. There are vague
scattered subpleural ground-glass opacities in the left upper lobe,
series 507 image 48, 58, and 65. Minimal subpleural ground-glass
opacity in the right upper lobe, series 507 image 65 and 78. No
confluent airspace disease. The trachea and mainstem bronchi are
patent. No evidence of pulmonary mass.

Musculoskeletal: Multilevel degenerative disc disease in the
thoracic spine. Posterior disc osteophyte complex at T11-T12 causes
mass effect on the spinal canal. There are no acute or suspicious
osseous abnormalities.

CT ABDOMEN PELVIS FINDINGS

Hepatobiliary: Prominent size liver spanning 20 cm cranial caudal.
No focal lesion on noncontrast exam. Gallbladder is partially
distended. There is no calcified gallstone or biliary dilatation. No
pericholecystic inflammation.

Pancreas: No ductal dilatation or inflammation.

Spleen: Normal in size without focal abnormality.

Adrenals/Urinary Tract: Normal adrenal glands.

Mild left hydroureteronephrosis. There are 2 adjacent stones in the
left proximal ureter just beyond the ureteropelvic junction
measuring 6 x 10 mm and 5 x 5 mm. The ureter distal to the stones is
also dilated to the bladder insertion. There are innumerable
layering stones in the urinary bladder which extend to the region of
the left trigone. Multiple additional nonobstructing stones
throughout the left kidney. Mild left perinephric edema. There is
air within the left renal pelvis and lower pole calyx. There is no
evidence of parenchymal air. Foley catheter decompresses the urinary
bladder with bladder air. 3.3 cm cyst in the upper right kidney.
There is no right hydronephrosis. No right renal calculi. Mild right
perinephric edema also seen on prior exam. No right ureteral
calculi.

Stomach/Bowel: Bowel evaluation is limited in the absence of enteric
contrast. Stomach partially distended. No small bowel dilatation to
suggest obstruction. No obvious bowel inflammation. The sigmoid
colon is redundant coursing into the upper abdomen. Small to
moderate volume of stool in the colon. There is minimal liquid stool
in the transverse colon without colonic wall thickening or
pericolonic edema. The appendix is not well-defined.

Vascular/Lymphatic: Aortic atherosclerosis, age advanced. No aortic
aneurysm. No bulky abdominopelvic adenopathy.

Reproductive: Prostate is unremarkable.

Other: Left greater than right retroperitoneal edema tracking into
the pelvis. Trace pelvic free fluid. Patchy subcutaneous edema
involving the left flank. Supraumbilical ventral abdominal wall
hernia just to the left of midline contains fat. There is also a
small fat containing umbilical hernia

Musculoskeletal: There are no acute or suspicious osseous
abnormalities. Multilevel degenerative change in the lumbar spine.
IMPRESSION: 1. Moderate left hydroureteronephrosis. There are 2 adjacent stones
in the left proximal ureter, however the ureter is also dilated
distal to the stones, and it is unclear if the stones are course of
renal obstruction. There are numerable dependent stones in the
urinary bladder, including in the region of the urinary trigone.
2. Left perinephric edema, which may be related to obstructive
uropathy, however there is air in the left renal pelvis in the lower
pole calyx. This may be ascending related to Foley catheter
placement versus infection and emphysematous pyelitis. There is no
renal parenchymal air.
3. Small bilateral pleural effusions. Streaky opacities in the left
lower lobe consistent with atelectasis.
4. Scattered subpleural ground-glass opacities in both upper lobes
are nonspecific, may be infectious or inflammatory. Consider
follow-up chest CT in 12 months to ensure resolution.
5. Small pericardial effusion.

Aortic Atherosclerosis ([id number]) .

## 2020-12-21 ENCOUNTER — Other Ambulatory Visit: Payer: Self-pay

## 2020-12-23 ENCOUNTER — Other Ambulatory Visit: Payer: Self-pay

## 2020-12-23 MED ORDER — HYDROCOD POLST-CPM POLST ER 10-8 MG/5ML PO SUER: ORAL | 0 refills | Status: DC

## 2020-12-23 MED ORDER — BENZONATATE 100 MG PO CAPS
ORAL_CAPSULE | ORAL | 1 refills | Status: DC
  Filled 2020-12-23: qty 60, 10d supply, fill #0

## 2020-12-23 MED ORDER — AZITHROMYCIN 250 MG PO TABS
ORAL_TABLET | ORAL | 0 refills | Status: DC
  Filled 2020-12-23: qty 6, 5d supply, fill #0

## 2020-12-23 MED ORDER — AZELASTINE HCL 0.1 % NA SOLN
NASAL | 1 refills | Status: DC
  Filled 2020-12-23: qty 30, 30d supply, fill #0

## 2020-12-23 MED ORDER — PROMETHAZINE-DM 6.25-15 MG/5ML PO SYRP
ORAL_SOLUTION | ORAL | 0 refills | Status: DC
  Filled 2020-12-23: qty 120, 8d supply, fill #0

## 2021-01-01 ENCOUNTER — Other Ambulatory Visit: Payer: Self-pay

## 2021-01-13 ENCOUNTER — Other Ambulatory Visit: Payer: Self-pay | Admitting: Nurse Practitioner

## 2021-01-13 ENCOUNTER — Other Ambulatory Visit: Payer: Self-pay

## 2021-01-13 DIAGNOSIS — E1165 Type 2 diabetes mellitus with hyperglycemia: Secondary | ICD-10-CM

## 2021-01-13 MED ORDER — VICTOZA 18 MG/3ML ~~LOC~~ SOPN
1.8000 mg | PEN_INJECTOR | Freq: Every day | SUBCUTANEOUS | 1 refills | Status: DC
  Filled 2021-01-13 – 2021-03-02 (×2): qty 9, 30d supply, fill #0

## 2021-01-13 MED FILL — Insulin Pen Needle 32 G X 4 MM (1/6" or 5/32"): 25 days supply | Qty: 100 | Fill #3 | Status: AC

## 2021-01-13 NOTE — Telephone Encounter (Signed)
Requested medication (s) are due for refill today Yes  Requested medication (s) are on the active medication list Yes  Future visit scheduled Yes for 02/26/21.  Note to clinic-Prescription expired. Need new prescription.   Requested Prescriptions  Pending Prescriptions Disp Refills   liraglutide (VICTOZA) 18 MG/3ML SOPN 9 mL 6    Sig: INJECT 1.8 MG INTO THE SKIN DAILY.     Endocrinology:  Diabetes - GLP-1 Receptor Agonists Passed - 01/13/2021 10:34 AM      Passed - HBA1C is between 0 and 7.9 and within 180 days    Hemoglobin A1C  Date Value Ref Range Status  11/27/2020 7.0 (A) 4.0 - 5.6 % Final   HbA1c, POC (controlled diabetic range)  Date Value Ref Range Status  07/02/2020 7.3 (A) 0.0 - 7.0 % Final          Passed - Valid encounter within last 6 months    Recent Outpatient Visits           1 month ago Uncontrolled type 2 diabetes mellitus with hyperglycemia [facility])   [place] [employer], [place] W, NP   6 months ago Uncontrolled type 2 diabetes mellitus with hyperglycemia [facility])   [place], [place] W, NP   6 months ago UTI symptoms   [place], [place] M, [place]   11 months ago Uncontrolled type 2 diabetes mellitus with hyperglycemia, with long-term current use of insulin [facility])   [place], [place] W, NP   1 year ago Uncontrolled type 2 diabetes mellitus with hyperglycemia, with long-term current use of insulin [facility])   [place], Vernia Buff, NP       Future Appointments             In 1 month Gildardo Pounds, NP [place]

## 2021-01-18 ENCOUNTER — Other Ambulatory Visit: Payer: Self-pay

## 2021-01-19 ENCOUNTER — Other Ambulatory Visit: Payer: Self-pay

## 2021-02-05 ENCOUNTER — Other Ambulatory Visit: Payer: Self-pay

## 2021-02-05 ENCOUNTER — Other Ambulatory Visit: Payer: Self-pay | Admitting: Nurse Practitioner

## 2021-02-05 DIAGNOSIS — E1165 Type 2 diabetes mellitus with hyperglycemia: Secondary | ICD-10-CM

## 2021-02-05 MED ORDER — LANTUS SOLOSTAR 100 UNIT/ML ~~LOC~~ SOPN
60.0000 [IU] | PEN_INJECTOR | Freq: Every day | SUBCUTANEOUS | 2 refills | Status: DC
  Filled 2021-02-05 – 2021-03-02 (×2): qty 15, 25d supply, fill #0
  Filled 2021-04-01: qty 15, 25d supply, fill #1

## 2021-02-08 ENCOUNTER — Other Ambulatory Visit: Payer: Self-pay

## 2021-02-26 ENCOUNTER — Ambulatory Visit: Payer: Medicaid Other | Admitting: Nurse Practitioner

## 2021-03-02 ENCOUNTER — Other Ambulatory Visit: Payer: Self-pay

## 2021-03-03 ENCOUNTER — Other Ambulatory Visit: Payer: Self-pay

## 2021-03-03 IMAGING — US US RENAL
1 series · 14 of 25 positions shown · non-contrast
Comparison: Prior CT from 06/23/2019.

CLINICAL DATA: Follow-up examination for ureteral calculus.

EXAM:
RENAL / URINARY TRACT ULTRASOUND COMPLETE

[Series 1: us renal · 14 of 41 slices shown]
[im 1/41]
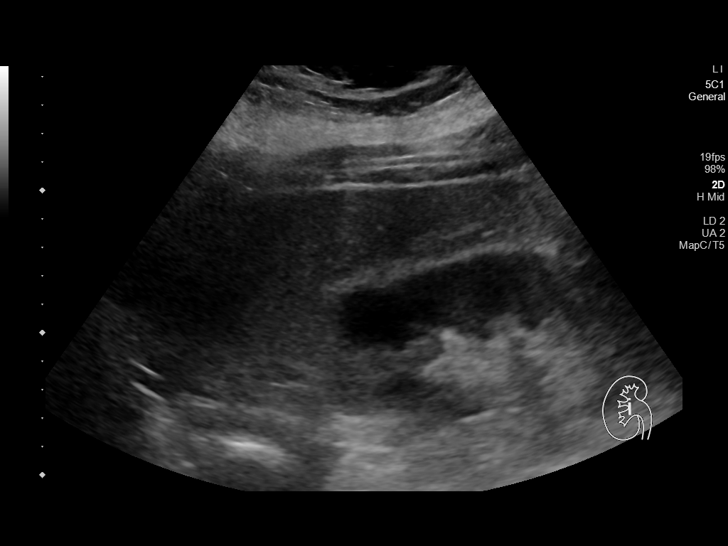
[im 4/41]
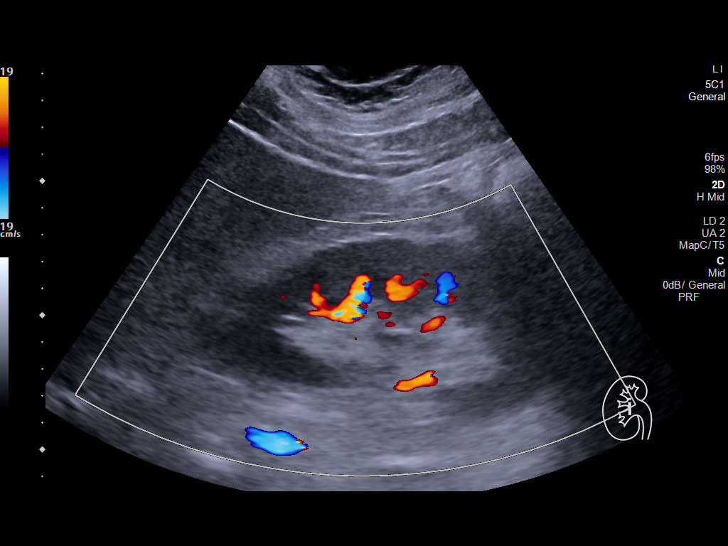
[im 7/41]
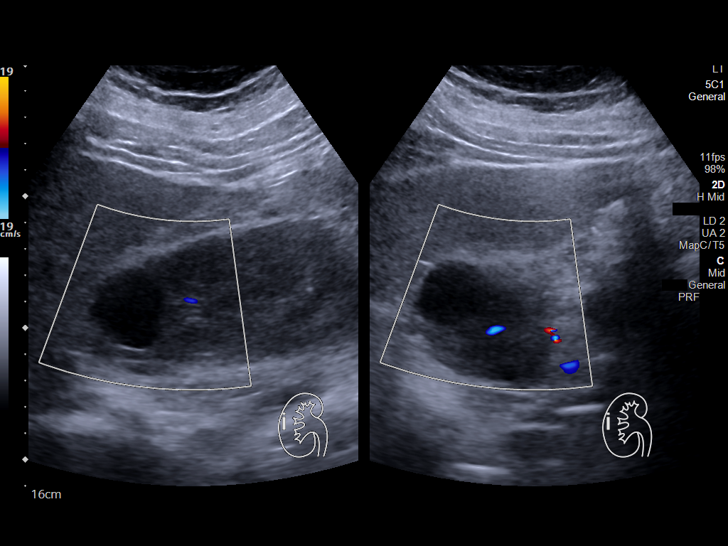
[im 11/41]
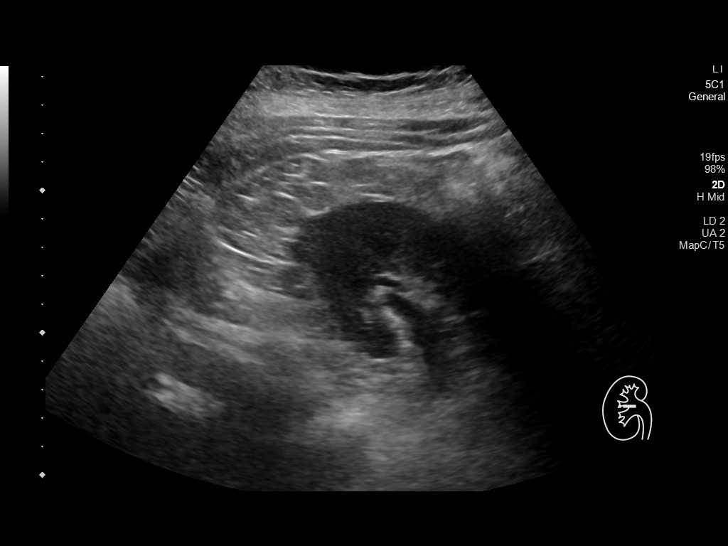
[im 14/41]
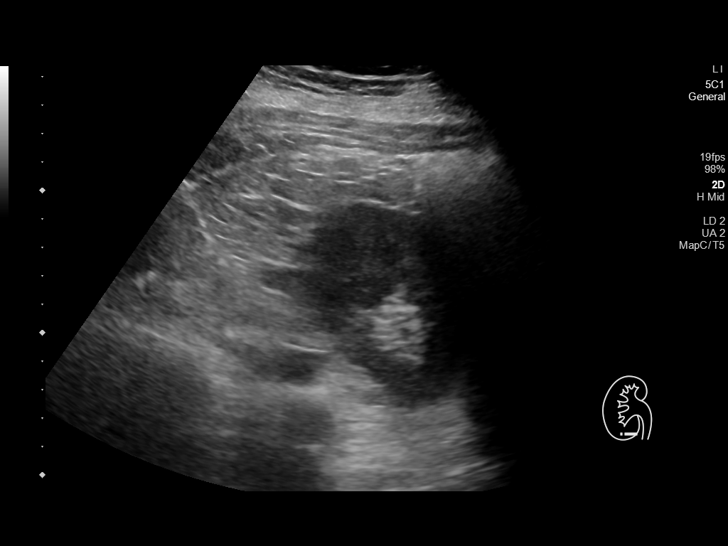
[im 16/41]
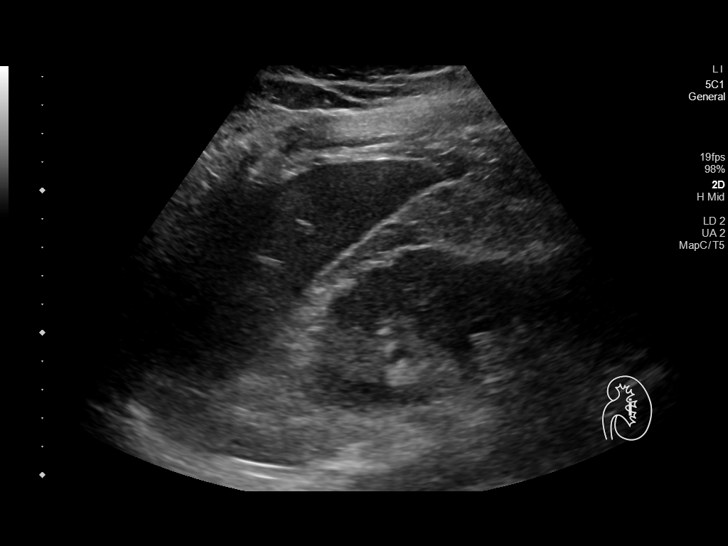
[im 19/41]
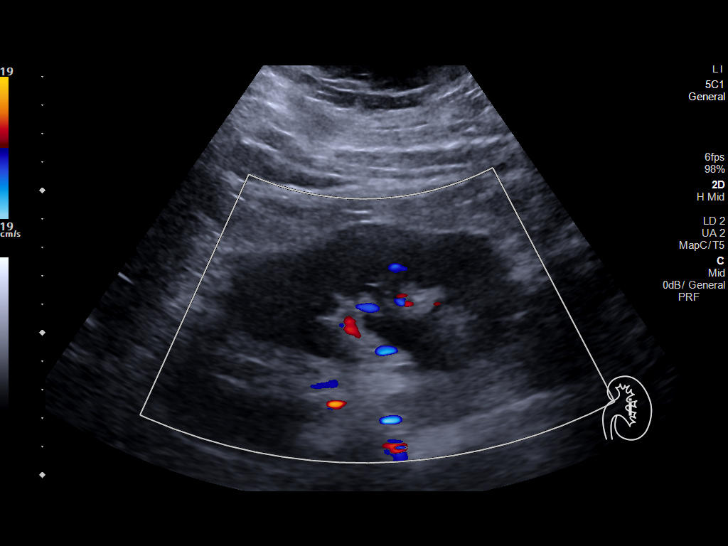
[im 22/41]
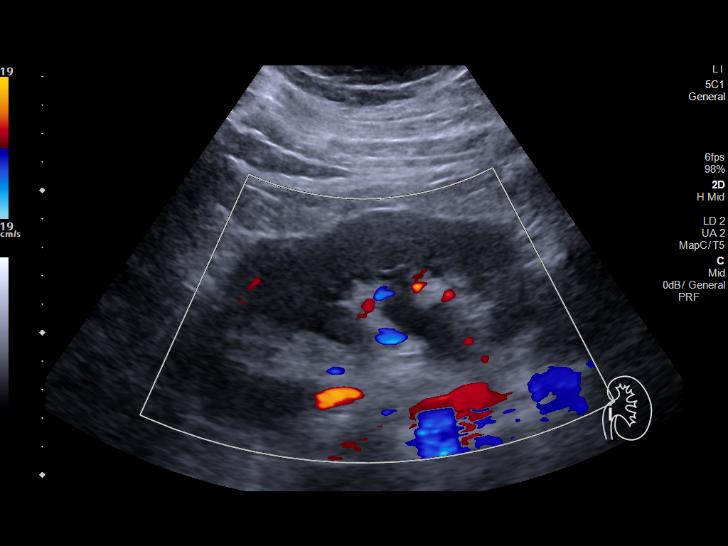
[im 26/41]
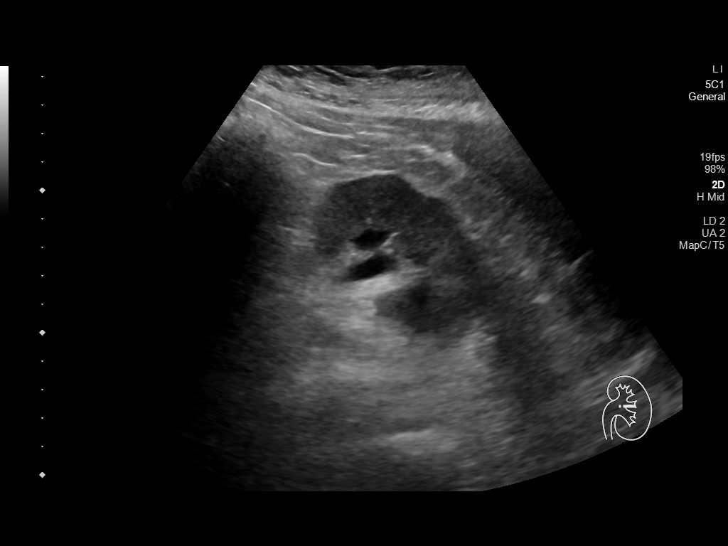
[im 27/41]
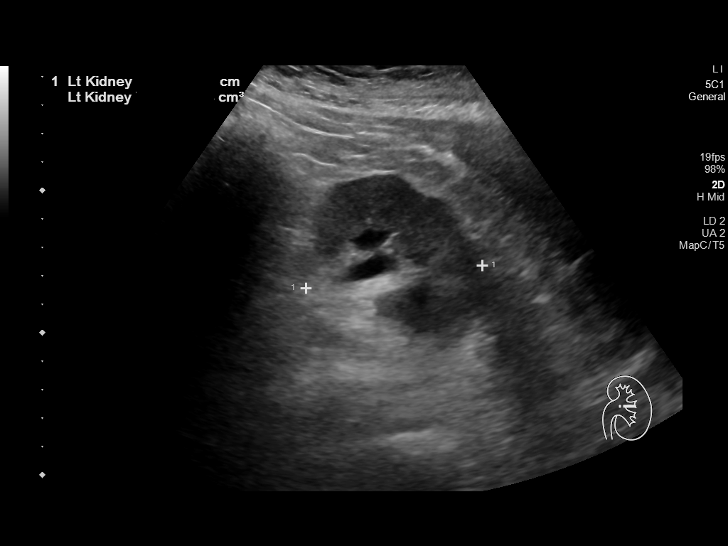
[im 31/41]
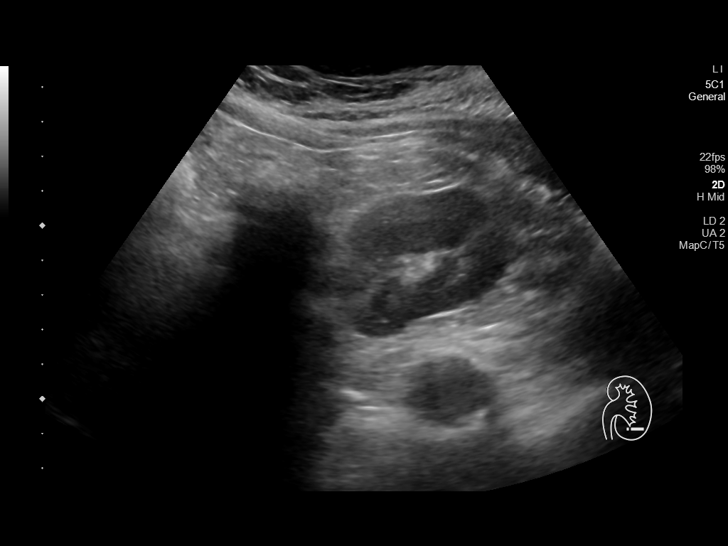
[im 34/41]
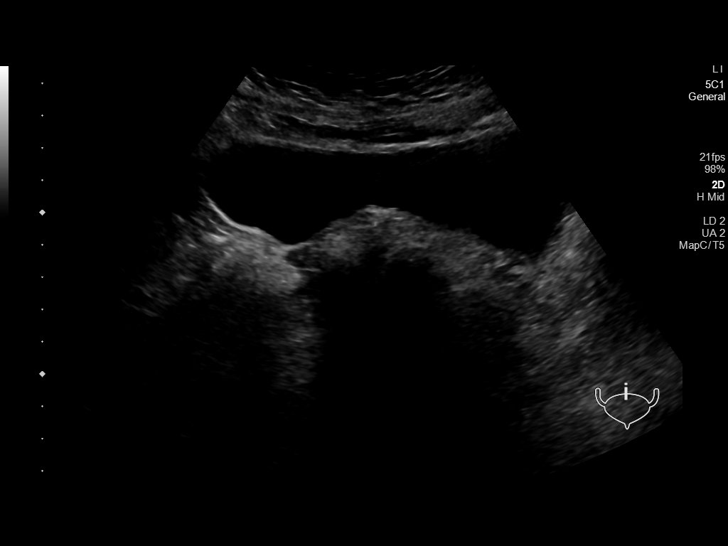
[im 37/41]
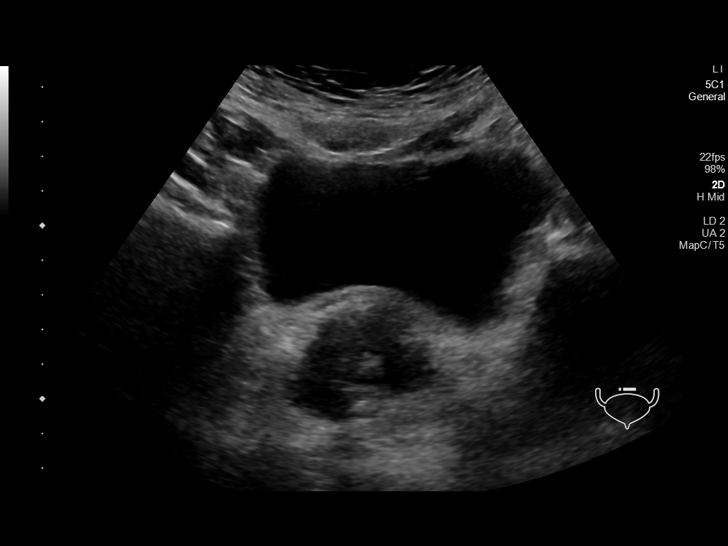
[im 41/41]
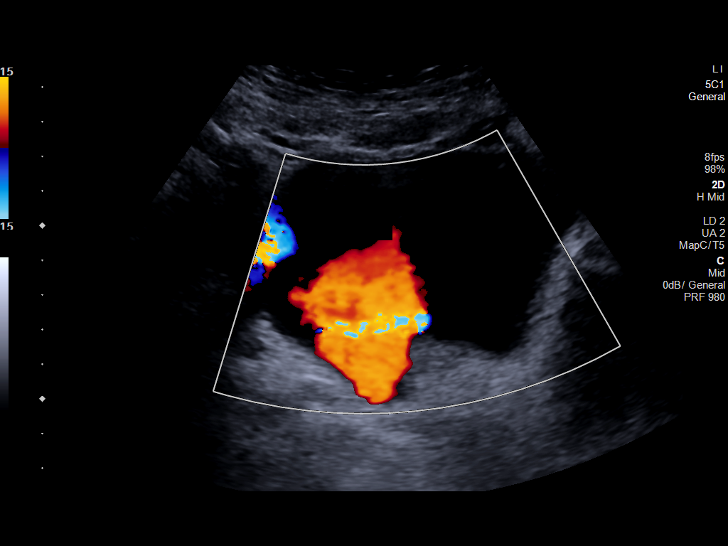

[14 of 25 positions shown; findings below may reference images not displayed]

FINDINGS: Right Kidney:

Renal measurements: 13.3 x 5.9 x 5.3 cm = volume: 214.1 mL. Renal
echogenicity within normal limits. No nephrolithiasis or
hydronephrosis. 2.9 x 3.1 x 3.0 cm simple cyst present at the upper
pole.

Left Kidney:

Renal measurements: 13.7 x 6.1 x 6.3 cm = volume: 274 mL. Renal
echogenicity within normal limits. No visible shadowing echogenic
calculi. Mild pelviectasis without overt hydronephrosis.

Bladder:

Appears normal for degree of bladder distention. A right jet is
visualized at the bladder. A left jet is not seen.

Other:

Enlarged nodular prostate measures 4.4 x 4.2 x 4.2 cm, invaginating
upon the base of the bladder.
IMPRESSION: 1. Mild left-sided pelviectasis without overt hydronephrosis. No
sonographic evidence for nephrolithiasis.
2. 3.1 cm simple cyst at the upper pole the right kidney.
3. Enlarged prostate.

## 2021-03-03 MED FILL — Insulin Pen Needle 32 G X 4 MM (1/6" or 5/32"): 25 days supply | Qty: 100 | Fill #0 | Status: AC

## 2021-03-04 ENCOUNTER — Other Ambulatory Visit: Payer: Self-pay

## 2021-03-05 ENCOUNTER — Other Ambulatory Visit: Payer: Self-pay | Admitting: Nurse Practitioner

## 2021-03-05 ENCOUNTER — Other Ambulatory Visit: Payer: Self-pay

## 2021-03-05 DIAGNOSIS — E1165 Type 2 diabetes mellitus with hyperglycemia: Secondary | ICD-10-CM

## 2021-03-05 MED ORDER — DAPAGLIFLOZIN PROPANEDIOL 10 MG PO TABS
10.0000 mg | ORAL_TABLET | Freq: Every day | ORAL | 2 refills | Status: DC
  Filled 2021-03-05: qty 30, 30d supply, fill #0
  Filled 2021-04-06: qty 30, 30d supply, fill #1

## 2021-03-05 NOTE — Telephone Encounter (Signed)
Requested medication (s) are due for refill today:   Not sure  Requested medication (s) are on the active medication list:   No  Future visit scheduled:   Yes   Last ordered: Don't know it's not on his medication list.   Requested Prescriptions  Pending Prescriptions Disp Refills   dapagliflozin propanediol (FARXIGA) 10 MG TABS tablet 90 tablet 0    Sig: Take 1 tablet (10 mg total) by mouth daily before breakfast.     Endocrinology:  Diabetes - SGLT2 Inhibitors Passed - 03/05/2021 11:33 AM      Passed - Cr in normal range and within 360 days    Creat  Date Value Ref Range Status  05/28/2015 0.84 0.70 - 1.33 mg/dL Final   Creatinine, Ser  Date Value Ref Range Status  11/27/2020 0.99 0.76 - 1.27 mg/dL Final          Passed - LDL in normal range and within 360 days    LDL Chol Calc (NIH)  Date Value Ref Range Status  07/02/2020 87 0 - 99 mg/dL Final   Direct LDL  Date Value Ref Range Status  11/09/2009 87.7 mg/dL Final    Comment:    See lab report for associated comment(s)          Passed - HBA1C is between 0 and 7.9 and within 180 days    Hemoglobin A1C  Date Value Ref Range Status  11/27/2020 7.0 (A) 4.0 - 5.6 % Final   HbA1c, POC (controlled diabetic range)  Date Value Ref Range Status  07/02/2020 7.3 (A) 0.0 - 7.0 % Final          Passed - eGFR in normal range and within 360 days    GFR calc Af Amer  Date Value Ref Range Status  02/03/2020 88 >59 mL/min/1.73 Final    Comment:    **In accordance with recommendations from the NKF-ASN Task force,**   [facility] is in the process of updating its eGFR calculation to the   2021 CKD-EPI creatinine equation that estimates kidney function   without a race variable.    GFR, Estimated  Date Value Ref Range Status  08/26/2020 >60 >60 mL/min Final    Comment:    (NOTE) Calculated using the CKD-EPI Creatinine Equation (2021)    eGFR  Date Value Ref Range Status  11/27/2020 88 >59 mL/min/1.73 Final           Passed - Valid encounter within last 6 months    Recent Outpatient Visits           3 months ago Uncontrolled type 2 diabetes mellitus with hyperglycemia [facility])   [place], [place] W, NP   8 months ago Uncontrolled type 2 diabetes mellitus with hyperglycemia [facility])   [place], Vernia Buff, NP   8 months ago UTI symptoms   [place], [place], [place]   1 year ago Uncontrolled type 2 diabetes mellitus with hyperglycemia, with long-term current use of insulin [facility])   [place], [place] W, NP   1 year ago Uncontrolled type 2 diabetes mellitus with hyperglycemia, with long-term current use of insulin [facility])   [place] Gildardo Pounds, NP       Future Appointments             In 1 month Gildardo Pounds, NP [facility]  [place]

## 2021-03-08 ENCOUNTER — Other Ambulatory Visit: Payer: Self-pay

## 2021-04-01 ENCOUNTER — Other Ambulatory Visit: Payer: Self-pay

## 2021-04-02 ENCOUNTER — Other Ambulatory Visit: Payer: Self-pay

## 2021-04-06 ENCOUNTER — Other Ambulatory Visit: Payer: Self-pay

## 2021-04-07 ENCOUNTER — Other Ambulatory Visit: Payer: Self-pay

## 2021-04-26 ENCOUNTER — Other Ambulatory Visit: Payer: Self-pay | Admitting: Nurse Practitioner

## 2021-04-26 ENCOUNTER — Other Ambulatory Visit: Payer: Self-pay

## 2021-04-26 ENCOUNTER — Ambulatory Visit: Payer: Medicaid Other | Attending: Nurse Practitioner | Admitting: Nurse Practitioner

## 2021-04-26 ENCOUNTER — Encounter: Payer: Self-pay | Admitting: Nurse Practitioner

## 2021-04-26 VITALS — BP 112/73 | HR 101 | Resp 18 | Ht 73.0 in | Wt 242.0 lb

## 2021-04-26 DIAGNOSIS — E1165 Type 2 diabetes mellitus with hyperglycemia: Secondary | ICD-10-CM

## 2021-04-26 DIAGNOSIS — Z794 Long term (current) use of insulin: Secondary | ICD-10-CM | POA: Diagnosis not present

## 2021-04-26 DIAGNOSIS — E1169 Type 2 diabetes mellitus with other specified complication: Secondary | ICD-10-CM

## 2021-04-26 DIAGNOSIS — E782 Mixed hyperlipidemia: Secondary | ICD-10-CM

## 2021-04-26 DIAGNOSIS — E785 Hyperlipidemia, unspecified: Secondary | ICD-10-CM

## 2021-04-26 DIAGNOSIS — D696 Thrombocytopenia, unspecified: Secondary | ICD-10-CM

## 2021-04-26 LAB — POCT GLYCOSYLATED HEMOGLOBIN (HGB A1C): Hemoglobin A1C: 6.6 % — AB (ref 4.0–5.6)

## 2021-04-26 LAB — GLUCOSE, POCT (MANUAL RESULT ENTRY): POC Glucose: 184 mg/dl — AB (ref 70–99)

## 2021-04-26 MED ORDER — METFORMIN HCL 1000 MG PO TABS
1000.0000 mg | ORAL_TABLET | Freq: Two times a day (BID) | ORAL | 1 refills | Status: DC
  Filled 2021-04-26 – 2021-07-01 (×3): qty 180, 90d supply, fill #0

## 2021-04-26 MED ORDER — ACCU-CHEK GUIDE W/DEVICE KIT
PACK | 0 refills | Status: AC
  Filled 2021-04-26: qty 1, 30d supply, fill #0

## 2021-04-26 MED ORDER — DAPAGLIFLOZIN PROPANEDIOL 10 MG PO TABS
10.0000 mg | ORAL_TABLET | Freq: Every day | ORAL | 1 refills | Status: DC
  Filled 2021-04-26 – 2021-08-02 (×3): qty 90, 90d supply, fill #0
  Filled 2021-08-02: qty 90, 90d supply, fill #1

## 2021-04-26 MED ORDER — TRUE METRIX BLOOD GLUCOSE TEST VI STRP
ORAL_STRIP | 12 refills | Status: DC
  Filled 2021-04-26: qty 50, 25d supply, fill #0

## 2021-04-26 MED ORDER — EZETIMIBE 10 MG PO TABS
10.0000 mg | ORAL_TABLET | Freq: Every day | ORAL | 3 refills | Status: DC
  Filled 2021-04-26 – 2021-07-01 (×3): qty 90, 90d supply, fill #0
  Filled ????-??-??: fill #0

## 2021-04-26 MED ORDER — LANTUS SOLOSTAR 100 UNIT/ML ~~LOC~~ SOPN
60.0000 [IU] | PEN_INJECTOR | Freq: Every day | SUBCUTANEOUS | 2 refills | Status: DC
  Filled 2021-04-26: qty 15, 25d supply, fill #0

## 2021-04-26 MED ORDER — OMEGA-3-ACID ETHYL ESTERS 1 G PO CAPS
2.0000 | ORAL_CAPSULE | Freq: Two times a day (BID) | ORAL | 2 refills | Status: DC
  Filled 2021-04-26: qty 120, 30d supply, fill #0

## 2021-04-26 MED ORDER — VICTOZA 18 MG/3ML ~~LOC~~ SOPN
1.8000 mg | PEN_INJECTOR | Freq: Every day | SUBCUTANEOUS | 1 refills | Status: DC
  Filled 2021-04-26: qty 27, 90d supply, fill #0

## 2021-04-26 MED ORDER — ACCU-CHEK GUIDE VI STRP
ORAL_STRIP | 12 refills | Status: DC
  Filled 2021-04-26: qty 100, 50d supply, fill #0
  Filled 2022-01-14: qty 100, 50d supply, fill #1
  Filled 2022-02-24: qty 100, 50d supply, fill #2
  Filled 2022-03-31 – 2022-04-04 (×2): qty 100, 50d supply, fill #3

## 2021-04-26 MED ORDER — DAPAGLIFLOZIN PROPANEDIOL 10 MG PO TABS
10.0000 mg | ORAL_TABLET | Freq: Every day | ORAL | 2 refills | Status: DC
  Filled 2021-04-26: qty 30, 30d supply, fill #0

## 2021-04-26 MED ORDER — LISINOPRIL 5 MG PO TABS
5.0000 mg | ORAL_TABLET | Freq: Every day | ORAL | 3 refills | Status: DC
  Filled 2021-04-26 – 2021-05-31 (×3): qty 90, 90d supply, fill #0
  Filled 2021-09-01: qty 90, 90d supply, fill #1
  Filled 2021-12-02: qty 90, 90d supply, fill #2

## 2021-04-26 MED ORDER — INSULIN GLARGINE 100 UNIT/ML SOLOSTAR PEN
60.0000 [IU] | PEN_INJECTOR | Freq: Every day | SUBCUTANEOUS | 3 refills | Status: DC
  Filled 2021-04-26: qty 54, 90d supply, fill #0
  Filled 2021-07-15: qty 54, 90d supply, fill #1
  Filled 2021-09-01 – 2021-09-21 (×2): qty 54, 90d supply, fill #2
  Filled 2021-12-27: qty 54, 90d supply, fill #3

## 2021-04-26 MED ORDER — OMEGA-3-ACID ETHYL ESTERS 1 G PO CAPS
2.0000 | ORAL_CAPSULE | Freq: Two times a day (BID) | ORAL | 3 refills | Status: DC
  Filled 2021-04-26 – 2021-05-31 (×2): qty 120, 30d supply, fill #0
  Filled 2021-05-31: qty 360, 90d supply, fill #0

## 2021-04-26 MED ORDER — ACCU-CHEK SOFTCLIX LANCETS MISC
12 refills | Status: DC
  Filled 2021-04-26: qty 100, 50d supply, fill #0
  Filled 2022-01-14: qty 100, 50d supply, fill #1
  Filled 2022-02-24: qty 100, 50d supply, fill #2
  Filled 2022-03-31 – 2022-04-04 (×2): qty 100, 50d supply, fill #3

## 2021-04-26 MED ORDER — TRUEPLUS LANCETS 28G MISC
1.0000 | Freq: Three times a day (TID) | 12 refills | Status: DC
  Filled 2021-04-26: qty 200, 50d supply, fill #0

## 2021-04-26 NOTE — Progress Notes (Signed)
Assessment & Plan:  Shane Bond was seen today for diabetes.  Diagnoses and all orders for this visit:  Uncontrolled type 2 diabetes mellitus with hyperglycemia ([place]) -     POCT glycosylated hemoglobin (Hb A1C) -     POCT glucose (manual entry) -     omega-3 acid ethyl esters (LOVAZA) 1 g capsule; TAKE 2 CAPSULES (2 G TOTAL) BY MOUTH 2 (TWO) TIMES DAILY. -     insulin glargine (LANTUS SOLOSTAR) 100 UNIT/ML Solostar Pen; Inject 60 Units into the skin daily. -     dapagliflozin propanediol (FARXIGA) 10 MG TABS tablet; Take 1 tablet (10 mg total) by mouth daily before breakfast. -     CMP14+EGFR -     Ambulatory referral to Ophthalmology Continue blood sugar control as discussed in office today, low carbohydrate diet, and regular physical exercise as tolerated, 150 minutes per week (30 min each day, 5 days per week, or 50 min 3 days per week). Keep blood sugar logs with fasting goal of 90-130 mg/dl, post prandial (after you eat) less than 180.  For Hypoglycemia: BS <60 and Hyperglycemia BS >400; contact the clinic ASAP. Annual eye exams and foot exams are recommended.   Mixed hyperlipidemia -     omega-3 acid ethyl esters (LOVAZA) 1 g capsule; TAKE 2 CAPSULES (2 G TOTAL) BY MOUTH 2 (TWO) TIMES DAILY.  Thrombocytopenia (HCC) -     CBC with Differential  Dyslipidemia, goal LDL below 70 -     Lipid panel    Patient has been counseled on age-appropriate routine health concerns for screening and prevention. These are reviewed and up-to-date. Referrals have been placed accordingly. Immunizations are up-to-date or declined.    Subjective:   Chief Complaint  Patient presents with   Diabetes   HPI Shane Bond 61 y.o. male presents to office today for follow up to DM He has a past medical history of AMI, INFERIOR WALL (10/22/2009), Anemia, Bladder atonia, BPH, CAD, NATIVE VESSEL (10/29/2009), Diabetes mellitus type 2, uncontrolled, with complications, Hyperlipidemia LDL goal <70,  Hypertension, Kidney stone, Neurogenic bladder as late effect of cerebrovascular accident (CVA), OBESITY (10/22/2009), and Prostate cancer .   DM 2 Monitoring blood glucose levels twice a day. Reports readings as "normal".  Currently taking metformin 1000 mg twice daily Farxiga 10 mg daily, Lantus 60 and Victoza 1.8 mg daily.  He is on renal dose ACE and taking Zetia and omega-3 for hyperlipidemia. Blood pressure is well controlled. LDL not at goal. Blood pressure is well controlled.  Lab Results  Component Value Date   HGBA1C 7.0 (A) 11/27/2020    Lab Results  Component Value Date   HGBA1C 7.0 (A) 11/27/2020   Lab Results  Component Value Date   LDLCALC 87 07/02/2020    BP Readings from Last 3 Encounters:  04/26/21 112/73  11/27/20 116/76  08/26/20 139/85      Review of Systems  Constitutional:  Negative for fever, malaise/fatigue and weight loss.  HENT: Negative.  Negative for nosebleeds.   Eyes: Negative.  Negative for blurred vision, double vision and photophobia.  Respiratory: Negative.  Negative for cough and shortness of breath.   Cardiovascular: Negative.  Negative for chest pain, palpitations and leg swelling.  Gastrointestinal: Negative.  Negative for heartburn, nausea and vomiting.  Musculoskeletal: Negative.  Negative for myalgias.  Neurological: Negative.  Negative for dizziness, focal weakness, seizures and headaches.  Psychiatric/Behavioral: Negative.  Negative for suicidal ideas.    Past Medical History:  Diagnosis  Date   AMI, INFERIOR WALL 10/22/2009   Anemia    Bladder atonia    BPH (benign prostatic hyperplasia)    CAD, NATIVE VESSEL 10/29/2009   Diabetes mellitus type 2, uncontrolled, with complications    Hyperlipidemia LDL goal <70    Hypertension    Kidney stone    Neurogenic bladder as late effect of cerebrovascular accident (CVA)    OBESITY 10/22/2009   Prostate cancer [facility])     Past Surgical History:  Procedure Laterality Date   BACK SURGERY      CORONARY STENT PLACEMENT     CYSTOSCOPY W/ URETERAL STENT PLACEMENT  02/26/2012   Procedure: CYSTOSCOPY WITH RETROGRADE PYELOGRAM/URETERAL STENT PLACEMENT;  Surgeon: Bernestine Amass, MD;  Location: [facility];  Service: Urology;  Laterality: Left;   CYSTOSCOPY WITH RETROGRADE PYELOGRAM, URETEROSCOPY AND STENT PLACEMENT Left 08/07/2019   Procedure: CYSTOSCOPY WITH LEFT RETROGRADE PYELOGRAM, URETEROSCOPY HOLMIUM LASER AND STENT PLACEMENT;  Surgeon: Ardis Hughs, MD;  Location: [facility];  Service: Urology;  Laterality: Left;   CYSTOSCOPY WITH STENT PLACEMENT Left 06/24/2019   Procedure: CYSTOSCOPY WITH STENT PLACEMENT LEFT URETER WITH LEFT RETROGRADE URETERAL;  Surgeon: Ceasar Mons, MD;  Location: [facility];  Service: Urology;  Laterality: Left;   testicles Right     Family History  Problem Relation Age of Onset   Diabetes Father    Coronary artery disease Other    Heart attack Other    Diabetes Paternal Aunt    Stomach cancer Maternal Uncle    Breast cancer Neg Hx    Colon cancer Neg Hx    Pancreatic cancer Neg Hx    Prostate cancer Neg Hx    Esophageal cancer Neg Hx    Liver disease Neg Hx     Social History Reviewed with no changes to be made today.   Outpatient Medications Prior to Visit  Medication Sig Dispense Refill   Aspirin 81 MG CAPS Take 1 tablet by mouth daily.     ezetimibe (ZETIA) 10 MG tablet TAKE 1 TABLET (10 MG TOTAL) BY MOUTH DAILY. 90 tablet 3   glucose blood (TRUE METRIX BLOOD GLUCOSE TEST) test strip Use as instructed 100 each 12   lisinopril (ZESTRIL) 5 MG tablet TAKE 1 TABLET (5 MG TOTAL) BY MOUTH DAILY. 90 tablet 3   metFORMIN (GLUCOPHAGE) 1000 MG tablet Take 1 tablet (1,000 mg total) by mouth 2 (two) times daily with a meal. 180 tablet 1   TRUEplus Lancets 28G MISC 1 each by Does not apply route 4 (four) times daily - after meals and at bedtime. 100 each 12   dapagliflozin propanediol (FARXIGA) 10 MG TABS tablet Take 1 tablet (10 mg total) by mouth daily  before breakfast. 30 tablet 2   insulin glargine (LANTUS SOLOSTAR) 100 UNIT/ML Solostar Pen Inject 60 Units into the skin daily. 15 mL 2   omega-3 acid ethyl esters (LOVAZA) 1 g capsule TAKE 2 CAPSULES (2 G TOTAL) BY MOUTH 2 (TWO) TIMES DAILY. 360 capsule 2   liraglutide (VICTOZA) 18 MG/3ML SOPN INJECT 1.8 MG INTO THE SKIN DAILY. 9 mL 1   Misc. Devices (CAREX COCCYX CUSHION) MISC Use daily when sitting for pressure relief ICD10 L98.429 (Patient not taking: Reported on 04/26/2021) 1 each prn   Misc. Devices MISC Please provide patient with standard sized 36F urinary catheters for self cathing along with lubricant. (Patient not taking: Reported on 04/26/2021) 50 each prn   tamsulosin (FLOMAX) 0.4 MG CAPS capsule Take 2 capsules (  0.8 mg total) by mouth daily after supper. (Patient not taking: Reported on 11/27/2020) 30 capsule 5   azelastine (ASTELIN) 0.1 % nasal spray Place 1 spray into both nostrils 2 (two) times daily (Patient not taking: Reported on 04/26/2021) 30 mL 1   azithromycin (ZITHROMAX) 250 MG tablet Take 2 tablets on day one, then 1 tablet daily for 4 days (Patient not taking: Reported on 04/26/2021) 6 tablet 0   benzonatate (TESSALON) 100 MG capsule Take 2 capsules (200 mg total) by mouth 3 (three) times daily as needed for cough (Patient not taking: Reported on 04/26/2021) 60 capsule 1   promethazine-dextromethorphan (PROMETHAZINE-DM) 6.25-15 MG/5ML syrup Take 5 mLs by mouth every 8 (eight) hours as needed for Cough (Patient not taking: Reported on 04/26/2021) 120 mL 0   No facility-administered medications prior to visit.    Allergies  Allergen Reactions   Lipitor [Atorvastatin] Rash       Objective:    BP 112/73    Pulse (!) 101    Resp 18    Ht '6\' 1"'  (1.854 m)    Wt 242 lb (109.8 kg)    SpO2 97%    BMI 31.93 kg/m  Wt Readings from Last 3 Encounters:  04/26/21 242 lb (109.8 kg)  11/27/20 239 lb 6 oz (108.6 kg)  08/26/20 251 lb 1.7 oz (113.9 kg)    Physical Exam Vitals and nursing  note reviewed.  Constitutional:      Appearance: He is well-developed.  HENT:     Head: Normocephalic and atraumatic.  Cardiovascular:     Rate and Rhythm: Normal rate and regular rhythm.     Heart sounds: Normal heart sounds. No murmur heard.   No friction rub. No gallop.  Pulmonary:     Effort: Pulmonary effort is normal. No tachypnea or respiratory distress.     Breath sounds: Normal breath sounds. No decreased breath sounds, wheezing, rhonchi or rales.  Chest:     Chest wall: No tenderness.  Abdominal:     General: Bowel sounds are normal.     Palpations: Abdomen is soft.  Musculoskeletal:        General: Normal range of motion.     Cervical back: Normal range of motion.  Skin:    General: Skin is warm and dry.  Neurological:     Mental Status: He is alert and oriented to person, place, and time.     Coordination: Coordination normal.  Psychiatric:        Behavior: Behavior normal. Behavior is cooperative.        Thought Content: Thought content normal.        Judgment: Judgment normal.         Patient has been counseled extensively about nutrition and exercise as well as the importance of adherence with medications and regular follow-up. The patient was given clear instructions to go to ER or return to medical center if symptoms don't improve, worsen or new problems develop. The patient verbalized understanding.   Follow-up: Return in about 3 months (around 07/27/2021).   Gildardo Pounds, FNP-BC [facility], [place]   04/26/2021, 2:03 PM

## 2021-04-27 ENCOUNTER — Other Ambulatory Visit: Payer: Self-pay

## 2021-04-27 ENCOUNTER — Encounter: Payer: Self-pay | Admitting: Nurse Practitioner

## 2021-04-27 LAB — CMP14+EGFR
ALT: 61 IU/L — ABNORMAL HIGH (ref 0–44)
AST: 36 IU/L (ref 0–40)
Albumin/Globulin Ratio: 1.3 (ref 1.2–2.2)
Albumin: 4.3 g/dL (ref 3.8–4.9)
Alkaline Phosphatase: 116 IU/L (ref 44–121)
BUN/Creatinine Ratio: 15 (ref 10–24)
BUN: 13 mg/dL (ref 8–27)
Bilirubin Total: 0.3 mg/dL (ref 0.0–1.2)
CO2: 21 mmol/L (ref 20–29)
Calcium: 9.8 mg/dL (ref 8.6–10.2)
Chloride: 101 mmol/L (ref 96–106)
Creatinine, Ser: 0.88 mg/dL (ref 0.76–1.27)
Globulin, Total: 3.2 g/dL (ref 1.5–4.5)
Glucose: 152 mg/dL — ABNORMAL HIGH (ref 70–99)
Potassium: 4.6 mmol/L (ref 3.5–5.2)
Sodium: 139 mmol/L (ref 134–144)
Total Protein: 7.5 g/dL (ref 6.0–8.5)
eGFR: 98 mL/min/{1.73_m2} (ref >59)

## 2021-04-27 LAB — CBC WITH DIFFERENTIAL/PLATELET
Basophils Absolute: 0.1 10*3/uL (ref 0.0–0.2)
Basos: 1 %
EOS (ABSOLUTE): 0.2 10*3/uL (ref 0.0–0.4)
Eos: 3 %
Hematocrit: 37.9 % (ref 37.5–51.0)
Hemoglobin: 13.5 g/dL (ref 13.0–17.7)
Immature Grans (Abs): 0 10*3/uL (ref 0.0–0.1)
Immature Granulocytes: 0 %
Lymphocytes Absolute: 1 10*3/uL (ref 0.7–3.1)
Lymphs: 20 %
MCH: 32.5 pg (ref 26.6–33.0)
MCHC: 35.6 g/dL (ref 31.5–35.7)
MCV: 91 fL (ref 79–97)
Monocytes Absolute: 0.7 10*3/uL (ref 0.1–0.9)
Monocytes: 14 %
Neutrophils Absolute: 3.1 10*3/uL (ref 1.4–7.0)
Neutrophils: 62 %
Platelets: 214 10*3/uL (ref 150–450)
RBC: 4.15 x10E6/uL (ref 4.14–5.80)
RDW: 13 % (ref 11.6–15.4)
WBC: 5.1 10*3/uL (ref 3.4–10.8)

## 2021-04-27 LAB — LIPID PANEL
Chol/HDL Ratio: 4.4 ratio (ref 0.0–5.0)
Cholesterol, Total: 157 mg/dL (ref 100–199)
HDL: 36 mg/dL — ABNORMAL LOW (ref >39)
LDL Chol Calc (NIH): 80 mg/dL (ref 0–99)
Triglycerides: 248 mg/dL — ABNORMAL HIGH (ref 0–149)
VLDL Cholesterol Cal: 41 mg/dL — ABNORMAL HIGH (ref 5–40)

## 2021-04-29 ENCOUNTER — Telehealth: Payer: Self-pay

## 2021-04-29 NOTE — Telephone Encounter (Signed)
Called patient reviewed all information and repeated back to me. Will call if any questions.  ? ?

## 2021-05-06 ENCOUNTER — Other Ambulatory Visit: Payer: Self-pay

## 2021-05-31 ENCOUNTER — Other Ambulatory Visit: Payer: Self-pay

## 2021-05-31 ENCOUNTER — Other Ambulatory Visit: Payer: Self-pay | Admitting: Pharmacist

## 2021-05-31 ENCOUNTER — Other Ambulatory Visit (HOSPITAL_COMMUNITY): Payer: Self-pay

## 2021-05-31 MED ORDER — UNIFINE PENTIPS 32G X 4 MM MISC
3 refills | Status: DC
Start: 2021-05-31 — End: 2021-12-27
  Filled 2021-05-31: qty 100, 50d supply, fill #0
  Filled 2021-06-25 – 2021-07-08 (×2): qty 100, 50d supply, fill #1
  Filled 2021-09-01: qty 100, 50d supply, fill #2
  Filled 2021-10-27: qty 100, 50d supply, fill #3

## 2021-06-25 ENCOUNTER — Other Ambulatory Visit: Payer: Self-pay

## 2021-07-01 ENCOUNTER — Other Ambulatory Visit: Payer: Self-pay

## 2021-07-01 ENCOUNTER — Other Ambulatory Visit (HOSPITAL_COMMUNITY): Payer: Self-pay

## 2021-07-08 ENCOUNTER — Other Ambulatory Visit: Payer: Self-pay

## 2021-07-09 ENCOUNTER — Other Ambulatory Visit: Payer: Self-pay

## 2021-07-15 ENCOUNTER — Other Ambulatory Visit: Payer: Self-pay

## 2021-07-16 ENCOUNTER — Other Ambulatory Visit: Payer: Self-pay

## 2021-07-21 ENCOUNTER — Other Ambulatory Visit: Payer: Self-pay

## 2021-07-26 ENCOUNTER — Ambulatory Visit: Payer: Self-pay

## 2021-07-26 ENCOUNTER — Ambulatory Visit: Payer: Medicaid Other | Attending: Family Medicine | Admitting: Family Medicine

## 2021-07-26 ENCOUNTER — Encounter: Payer: Self-pay | Admitting: Family Medicine

## 2021-07-26 ENCOUNTER — Other Ambulatory Visit: Payer: Self-pay

## 2021-07-26 VITALS — BP 115/79 | HR 94 | Ht 73.0 in | Wt 238.6 lb

## 2021-07-26 DIAGNOSIS — R3129 Other microscopic hematuria: Secondary | ICD-10-CM

## 2021-07-26 DIAGNOSIS — J029 Acute pharyngitis, unspecified: Secondary | ICD-10-CM

## 2021-07-26 LAB — POCT URINALYSIS DIP (CLINITEK)
Bilirubin, UA: NEGATIVE
Glucose, UA: 500 mg/dL — AB
Ketones, POC UA: NEGATIVE mg/dL
Leukocytes, UA: NEGATIVE
Nitrite, UA: NEGATIVE
POC PROTEIN,UA: NEGATIVE
Spec Grav, UA: 1.025 (ref 1.010–1.025)
Urobilinogen, UA: 0.2 E.U./dL
pH, UA: 6 (ref 5.0–8.0)

## 2021-07-26 MED ORDER — FEXOFENADINE HCL 180 MG PO TABS
180.0000 mg | ORAL_TABLET | Freq: Every day | ORAL | 0 refills | Status: DC
  Filled 2021-07-26: qty 30, 30d supply, fill #0

## 2021-07-26 MED ORDER — PREDNISONE 20 MG PO TABS
20.0000 mg | ORAL_TABLET | Freq: Every day | ORAL | 0 refills | Status: DC
  Filled 2021-07-26: qty 5, 5d supply, fill #0

## 2021-07-26 NOTE — Telephone Encounter (Signed)
  Chief Complaint: sore swollen throat- strep throat exposure Symptoms: itchy throat makes him cough, sinus congestion right ide of throat and right neck lymph nodes swollen Frequency: 1 week Pertinent Negatives: Patient denies difficulty breathing, headache or rash Disposition: '[]'$ ED /'[]'$ Urgent Care (no appt availability in office) / '[x]'$ Appointment(In office/virtual)/ '[]'$  [facility]/ '[]'$ Home Care/ '[]'$ Refused Recommended Disposition /'[]'$ [place]/ '[]'$  Follow-up with PCP Additional Notes: Pt given appt today in office     Reason for Disposition  [1] Pus on tonsils (back of throat) AND [2]  fever AND [3] swollen neck lymph nodes ("glands")  Answer Assessment - Initial Assessment Questions 1. ONSET: "When did the throat start hurting?" (Hours or days ago)      Week ago  2. SEVERITY: "How bad is the sore throat?" (Scale 1-10; mild, moderate or severe)   - MILD (1-3):  doesn't interfere with eating or normal activities   - MODERATE (4-7): interferes with eating some solids and normal activities   - SEVERE (8-10):  excruciating pain, interferes with most normal activities   - SEVERE DYSPHAGIA: can't swallow liquids, drooling     mild 3. STREP EXPOSURE: "Has there been any exposure to strep within the past week?" If Yes, ask: "What type of contact occurred?"      Yes friend 4.  VIRAL SYMPTOMS: "Are there any symptoms of a cold, such as a runny nose, cough, hoarse voice or red eyes?"      Runny nose, sinus congestion, throat scratchy,  5. FEVER: "Do you have a fever?" If Yes, ask: "What is your temperature, how was it measured, and when did it start?"     no 6. PUS ON THE TONSILS: "Is there pus on the tonsils in the back of your throat?"     Does not know 7. OTHER SYMPTOMS: "Do you have any other symptoms?" (e.g., difficulty breathing, headache, rash)     no  Protocols used: Sore Throat-A-AH

## 2021-07-26 NOTE — Progress Notes (Signed)
Subjective:  Patient ID: Shane Bond, male    DOB: 09-21-1960  Age: 61 y.o. MRN: [id number]  CC: Sore Throat   HPI DINK CREPS is a 61 y.o. year old male patient of Shane Rankins, FNP with a history of type 2 diabetes mellitus, hyperlipidemia, CA prostate, CVA with neurogenic bladder (requiring self-catheterization) here for an acute visit.  Interval History: He was not having any urinary symptoms but wanted to have his urine checked for an infection.  He self catheterizes and has not noticed any different urinary symptoms even though he told the nurse his urine had a foul odor.  He complains of swelling on the R lateral aspect of his neck. He throat is scratchy, he is coughing , his throat felt swollen inside.He has throat pain on swallowing rated as 1-2/10 and use of oropharyngeal spray makes his throat burn. States a friend of hs had strept throat.  He denies presence of fever. He endorses presence of mucus down his throat and does a lot of coughing.  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   Anemia    Bladder atonia    BPH (benign prostatic hyperplasia)    CAD, NATIVE VESSEL 10/29/2009   Diabetes mellitus type 2, uncontrolled, with complications    Hyperlipidemia LDL goal <70    Hypertension    Kidney stone    Neurogenic bladder as late effect of cerebrovascular accident (CVA)    OBESITY 10/22/2009   Prostate cancer [facility])     Past Surgical History:  Procedure Laterality Date   BACK SURGERY     CORONARY STENT PLACEMENT     CYSTOSCOPY W/ URETERAL STENT PLACEMENT  02/26/2012   Procedure: CYSTOSCOPY WITH RETROGRADE PYELOGRAM/URETERAL STENT PLACEMENT;  Surgeon: Bernestine Amass, MD;  [facility];  Service: Urology;  Laterality: Left;   CYSTOSCOPY WITH RETROGRADE PYELOGRAM, URETEROSCOPY AND STENT PLACEMENT Left 08/07/2019   Procedure: CYSTOSCOPY WITH LEFT RETROGRADE PYELOGRAM, URETEROSCOPY HOLMIUM LASER AND STENT PLACEMENT;  Surgeon: Ardis Hughs, MD;  [facility];  Service: Urology;  Laterality: Left;   CYSTOSCOPY WITH STENT PLACEMENT Left 06/24/2019   Procedure: CYSTOSCOPY WITH STENT PLACEMENT LEFT URETER WITH LEFT RETROGRADE URETERAL;  Surgeon: Ceasar Mons, MD;  [facility];  Service: Urology;  Laterality: Left;   testicles Right     Family History  Problem Relation Age of Onset   Diabetes Father    Coronary artery disease Other    Heart attack Other    Diabetes Paternal Aunt    Stomach cancer Maternal Uncle    Breast cancer Neg Hx    Colon cancer Neg Hx    Pancreatic cancer Neg Hx    Prostate cancer Neg Hx    Esophageal cancer Neg Hx    Liver disease Neg Hx     Social History   Socioeconomic History   Marital status: Single    Spouse name: Not on file   Number of children: Not on file   Years of education: Not on file   Highest education level: Not on file  Occupational History    Comment: 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  Tobacco Use   Smoking status: Every Day    Types: E-cigarettes   Smokeless tobacco: Former  Scientific laboratory technician Use: Every day  Substance and Sexual Activity   Alcohol use: Yes    Comment: occ   Drug use: No   Sexual activity: Not  Currently  Other Topics Concern   Not on file  Social History Narrative   Not on file   Social Determinants of Health   Financial Resource Strain: Not on file  Food Insecurity: Not on file  Transportation Needs: Not on file  Physical Activity: Not on file  Stress: Not on file  Social Connections: Not on file    Allergies  Allergen Reactions   Lipitor [Atorvastatin] Rash    Outpatient Medications Prior to Visit  Medication Sig Dispense Refill   Accu-Chek Softclix Lancets lancets Use as instructed to check blood glucose level by fingerstick twice per day. 100 each 12   Aspirin 81 MG CAPS Take 1 tablet by mouth daily.     Blood Glucose Monitoring Suppl (ACCU-CHEK GUIDE) w/Device KIT Use as instructed to check  blood glucose level by fingerstick twice per day 1 kit 0   dapagliflozin propanediol (FARXIGA) 10 MG TABS tablet Take 1 tablet (10 mg total) by mouth daily before breakfast. Please fill as a 90 day supply 90 tablet 1   ezetimibe (ZETIA) 10 MG tablet Take 1 tablet by mouth daily. 90 tablet 3   glucose blood (ACCU-CHEK GUIDE) test strip Use as instructed to check blood glucose by fingerstick twice per day. 100 each 12   insulin glargine (LANTUS) 100 UNIT/ML Solostar Pen Inject 60 Units into the skin daily. 54 mL 3   Insulin Pen Needle (UNIFINE PENTIPS) 32G X 4 MM MISC Use to inject insulin and Victoza. 100 each 3   lisinopril (ZESTRIL) 5 MG tablet Take 1 tablet (5 mg total) by mouth daily. Please fill as a 90 day supply 90 tablet 3   metFORMIN (GLUCOPHAGE) 1000 MG tablet Take 1 tablet by mouth 2 times daily with a meal. 180 tablet 1   omega-3 acid ethyl esters (LOVAZA) 1 g capsule Take 2 capsules (2 g total) by mouth 2 (two) times daily. Please fill as a 90 day supply 360 capsule 3   liraglutide (VICTOZA) 18 MG/3ML SOPN Inject 1.8 mg into the skin daily. Please fill as a 90 day supply 27 mL 1   Misc. Devices (CAREX COCCYX CUSHION) MISC Use daily when sitting for pressure relief ICD10 L98.429 (Patient not taking: Reported on 04/26/2021) 1 each prn   Misc. Devices MISC Please provide patient with standard sized 19F urinary catheters for self cathing along with lubricant. (Patient not taking: Reported on 04/26/2021) 50 each prn   tamsulosin (FLOMAX) 0.4 MG CAPS capsule Take 2 capsules (0.8 mg total) by mouth daily after supper. (Patient not taking: Reported on 11/27/2020) 30 capsule 5   No facility-administered medications prior to visit.     ROS Review of Systems  Constitutional:  Negative for activity change and appetite change.  HENT:  Positive for sore throat. Negative for sinus pressure.   Respiratory:  Negative for chest tightness, shortness of breath and wheezing.   Cardiovascular:  Negative for  chest pain and palpitations.  Gastrointestinal:  Negative for abdominal distention, abdominal pain and constipation.  Genitourinary: Negative.  Negative for flank pain.  Musculoskeletal: Negative.   Psychiatric/Behavioral:  Negative for behavioral problems and dysphoric mood.    Objective:  BP 115/79   Pulse 94   Ht 6' 1" (1.854 m)   Wt 238 lb 9.6 oz (108.2 kg)   SpO2 96%   BMI 31.48 kg/m      07/26/2021    4:02 PM 04/26/2021    1:51 PM 11/27/2020   11:15 AM  BP/Weight  Systolic BP 097 353 299  Diastolic BP 79 73 76  Wt. (Lbs) 238.6 242 239.38  BMI 31.48 kg/m2 31.93 kg/m2 31.58 kg/m2      Physical Exam Constitutional:      Appearance: He is well-developed.  HENT:     Mouth/Throat:     Tonsils: No tonsillar exudate or tonsillar abscesses.  Cardiovascular:     Rate and Rhythm: Normal rate.     Heart sounds: Normal heart sounds. No murmur heard. Pulmonary:     Effort: Pulmonary effort is normal.     Breath sounds: Normal breath sounds. No wheezing or rales.  Chest:     Chest wall: No tenderness.  Abdominal:     General: Bowel sounds are normal. There is no distension.     Palpations: Abdomen is soft. There is no mass.     Tenderness: There is no abdominal tenderness. There is no right CVA tenderness or left CVA tenderness.  Musculoskeletal:        General: Normal range of motion.     Right lower leg: No edema.     Left lower leg: No edema.  Lymphadenopathy:     Cervical: Cervical adenopathy (R anterior cervical) present.  Neurological:     Mental Status: He is alert and oriented to person, place, and time.  Psychiatric:        Mood and Affect: Mood normal.       Latest Ref Rng & Units 04/26/2021    2:25 PM 11/27/2020   11:40 AM 08/26/2020    6:43 AM  CMP  Glucose 70 - 99 mg/dL 152   226   210    BUN 8 - 27 mg/dL _0 Creatinine 0.76 - 1.27 mg/dL 0.88   0.99   0.87    Sodium 134 - 144 mmol/L 139   140   137    Potassium 3.5 - 5.2 mmol/L 4.6   4.7    4.2    Chloride 96 - 106 mmol/L 101   102   103    CO2 20 - 29 mmol/L _1 Calcium 8.6 - 10.2 mg/dL 9.8   10.1   9.3    Total Protein 6.0 - 8.5 g/dL 7.5   7.7     Total Bilirubin 0.0 - 1.2 mg/dL 0.3   0.3     Alkaline Phos 44 - 121 IU/L 116   95     AST 0 - 40 IU/L 36   38     ALT 0 - 44 IU/L 61   57       Lipid Panel     Component Value Date/Time   CHOL 157 04/26/2021 1425   TRIG 248 (H) 04/26/2021 1425   HDL 36 (L) 04/26/2021 1425   CHOLHDL 4.4 04/26/2021 1425   CHOLHDL 5.1 (H) 06/18/2015 1116   VLDL 32 (H) 06/18/2015 1116   LDLCALC 80 04/26/2021 1425   LDLDIRECT 87.7 11/09/2009 1026    CBC    Component Value Date/Time   WBC 5.1 04/26/2021 1425   WBC 3.6 (L) 08/26/2020 0643   RBC 4.15 04/26/2021 1425   RBC 3.69 (L) 08/26/2020 0643   HGB 13.5 04/26/2021 1425   HCT 37.9 04/26/2021 1425   PLT 214 04/26/2021 1425   MCV 91 04/26/2021 1425   MCH 32.5 04/26/2021 1425   MCH 32.8 08/26/2020 0643   MCHC 35.6 04/26/2021  1425   MCHC 34.1 08/26/2020 0643   RDW 13.0 04/26/2021 1425   LYMPHSABS 1.0 04/26/2021 1425   MONOABS 0.6 08/22/2020 1112   EOSABS 0.2 04/26/2021 1425   BASOSABS 0.1 04/26/2021 1425    Lab Results  Component Value Date   HGBA1C 6.6 (A) 04/26/2021    Assessment & Plan:  1. Other microscopic hematuria UA negative for UTI Hematuria explainable by self-catheterization - POCT URINALYSIS DIP (CLINITEK)  2. Acute pharyngitis, unspecified etiology Presence of cough [place] suspicion for strep throat We will place on short course of prednisone as he could be experiencing some postnasal drip which can cause his coughing Also placed on an antihistamine - Strep Gp B Culture+Rflx    Meds ordered this encounter  Medications   predniSONE (DELTASONE) 20 MG tablet    Sig: Take 1 tablet (20 mg total) by mouth daily with breakfast.    Dispense:  5 tablet    Refill:  0   fexofenadine (ALLEGRA ALLERGY) 180 MG tablet    Sig: Take 1 tablet (180 mg  total) by mouth daily.    Dispense:  30 tablet    Refill:  0    Follow-up: Return for Medical conditions with PCP.       Charlott Rakes, MD, FAAFP. [facility] [place], [place]   07/26/2021, 5:22 PM

## 2021-07-26 NOTE — Progress Notes (Signed)
Exposure to strept throat Foul smelling urine.

## 2021-07-27 LAB — STREP GP B CULTURE+RFLX

## 2021-08-02 ENCOUNTER — Other Ambulatory Visit: Payer: Self-pay

## 2021-08-02 ENCOUNTER — Other Ambulatory Visit (HOSPITAL_COMMUNITY): Payer: Self-pay

## 2021-08-18 LAB — SPECIMEN STATUS REPORT

## 2021-08-18 LAB — CULTURE, GROUP A STREP: Strep A Culture: NEGATIVE

## 2021-09-01 ENCOUNTER — Other Ambulatory Visit: Payer: Self-pay

## 2021-09-01 ENCOUNTER — Other Ambulatory Visit (HOSPITAL_COMMUNITY): Payer: Self-pay

## 2021-09-20 ENCOUNTER — Other Ambulatory Visit: Payer: Self-pay

## 2021-09-20 ENCOUNTER — Other Ambulatory Visit (HOSPITAL_COMMUNITY): Payer: Self-pay

## 2021-09-21 ENCOUNTER — Other Ambulatory Visit (HOSPITAL_COMMUNITY): Payer: Self-pay

## 2021-10-26 ENCOUNTER — Other Ambulatory Visit: Payer: Self-pay

## 2021-10-27 ENCOUNTER — Other Ambulatory Visit: Payer: Self-pay

## 2021-11-01 ENCOUNTER — Other Ambulatory Visit: Payer: Self-pay

## 2021-11-01 ENCOUNTER — Other Ambulatory Visit: Payer: Self-pay | Admitting: Nurse Practitioner

## 2021-11-01 DIAGNOSIS — E1165 Type 2 diabetes mellitus with hyperglycemia: Secondary | ICD-10-CM

## 2021-11-02 MED ORDER — DAPAGLIFLOZIN PROPANEDIOL 10 MG PO TABS
10.0000 mg | ORAL_TABLET | Freq: Every day | ORAL | 0 refills | Status: DC
  Filled 2021-11-02: qty 30, 30d supply, fill #0

## 2021-11-02 NOTE — Telephone Encounter (Signed)
Requested medications are due for refill today.  unsure  Requested medications are on the active medications list.  yes  Last refill. 04/26/2021 #90 1 rf  Future visit scheduled.   no  Notes to clinic.  Rx written to expire 11/02/2021 - Rx is expired.    Requested Prescriptions  Pending Prescriptions Disp Refills   dapagliflozin propanediol (FARXIGA) 10 MG TABS tablet 90 tablet 1    Sig: Take 1 tablet (10 mg total) by mouth daily before breakfast.     Endocrinology:  Diabetes - SGLT2 Inhibitors Failed - 11/01/2021 11:30 AM      Failed - HBA1C is between 0 and 7.9 and within 180 days    Hemoglobin A1C  Date Value Ref Range Status  04/26/2021 6.6 (A) 4.0 - 5.6 % Final   HbA1c, POC (controlled diabetic range)  Date Value Ref Range Status  07/02/2020 7.3 (A) 0.0 - 7.0 % Final         Passed - Cr in normal range and within 360 days    Creat  Date Value Ref Range Status  05/28/2015 0.84 0.70 - 1.33 mg/dL Final   Creatinine, Ser  Date Value Ref Range Status  04/26/2021 0.88 0.76 - 1.27 mg/dL Final         Passed - eGFR in normal range and within 360 days    GFR calc Af Amer  Date Value Ref Range Status  02/03/2020 88 >59 mL/min/1.73 Final    Comment:    **In accordance with recommendations from the NKF-ASN Task force,**   [facility] is in the process of updating its eGFR calculation to the   2021 CKD-EPI creatinine equation that estimates kidney function   without a race variable.    GFR, Estimated  Date Value Ref Range Status  08/26/2020 >60 >60 mL/min Final    Comment:    (NOTE) Calculated using the CKD-EPI Creatinine Equation (2021)    eGFR  Date Value Ref Range Status  04/26/2021 98 >59 mL/min/1.73 Final         Passed - Valid encounter within last 6 months    Recent Outpatient Visits           3 months ago Other microscopic hematuria   [place], [place], MD   6 months ago Uncontrolled type 2 diabetes mellitus with  hyperglycemia, with long-term current use of insulin [facility])   [place], [place] W, NP   11 months ago Uncontrolled type 2 diabetes mellitus with hyperglycemia [facility])   [place], [place] W, NP   1 year ago Uncontrolled type 2 diabetes mellitus with hyperglycemia [facility])   [place], Vernia Buff, NP   1 year ago UTI symptoms   [place], [place], [place]

## 2021-11-03 ENCOUNTER — Other Ambulatory Visit: Payer: Self-pay

## 2021-12-02 ENCOUNTER — Other Ambulatory Visit: Payer: Self-pay | Admitting: Family Medicine

## 2021-12-02 ENCOUNTER — Other Ambulatory Visit: Payer: Self-pay

## 2021-12-02 ENCOUNTER — Encounter (HOSPITAL_COMMUNITY): Payer: Self-pay | Admitting: Emergency Medicine

## 2021-12-02 ENCOUNTER — Ambulatory Visit (HOSPITAL_COMMUNITY)
Admission: EM | Admit: 2021-12-02 | Discharge: 2021-12-02 | Disposition: A | Discharge status: Home / Self Care | Payer: Medicaid Other | Attending: Family Medicine | Admitting: Family Medicine

## 2021-12-02 DIAGNOSIS — N390 Urinary tract infection, site not specified: Secondary | ICD-10-CM | POA: Insufficient documentation

## 2021-12-02 DIAGNOSIS — R300 Dysuria: Secondary | ICD-10-CM | POA: Diagnosis present

## 2021-12-02 DIAGNOSIS — E1165 Type 2 diabetes mellitus with hyperglycemia: Secondary | ICD-10-CM

## 2021-12-02 LAB — POCT URINALYSIS DIPSTICK, ED / UC
Bilirubin Urine: NEGATIVE
Glucose, UA: >=1000 mg/dL — AB
Ketones, ur: NEGATIVE mg/dL
Nitrite: POSITIVE — AB
Protein, ur: NEGATIVE mg/dL
Specific Gravity, Urine: 1.02 (ref 1.005–1.030)
Urobilinogen, UA: 0.2 mg/dL (ref 0.0–1.0)
pH: 5.5 (ref 5.0–8.0)

## 2021-12-02 MED ORDER — SULFAMETHOXAZOLE-TRIMETHOPRIM 800-160 MG PO TABS
1.0000 | ORAL_TABLET | Freq: Two times a day (BID) | ORAL | 0 refills | Status: AC
  Filled 2021-12-02: qty 14, 7d supply, fill #0

## 2021-12-02 MED ORDER — DAPAGLIFLOZIN PROPANEDIOL 10 MG PO TABS
10.0000 mg | ORAL_TABLET | Freq: Every day | ORAL | 0 refills | Status: DC
  Filled 2021-12-02: qty 30, 30d supply, fill #0

## 2021-12-02 NOTE — ED Provider Notes (Signed)
[facility]    CSN: [id number] Arrival date & time: 12/02/21  1220      History   Chief Complaint Chief Complaint  Patient presents with   Urinary Tract Infection    HPI Shane Bond is a 61 y.o. male.   Patient is here for UTI symptoms.  He has a h/o recurrent uti's per patient and chart review.  He has h/o prostate cancer, s/p SVA with neurogenic bladder (requires self-catherization)   He started with symptoms about 2 days ago.  Having a foul odor, slightly cloudy urine, with slight dysuria.  No abd pain or back pain No fevers/chills.  No n/v.        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   Anemia    Bladder atonia    BPH (benign prostatic hyperplasia)    CAD, NATIVE VESSEL 10/29/2009   Diabetes mellitus type 2, uncontrolled, with complications    Hyperlipidemia LDL goal <70    Hypertension    Kidney stone    Neurogenic bladder as late effect of cerebrovascular accident (CVA)    OBESITY 10/22/2009   Prostate cancer (HCC)     Patient Active Problem List   Diagnosis Date Noted   Malignant neoplasm of prostate (HCC) 09/03/2019   Protein-calorie malnutrition, severe (HCC) 06/24/2019   Hydronephrosis with renal and ureteral calculus obstruction 06/24/2019   Thrombocytopenia (HCC) 06/21/2019   Normocytic anemia 06/21/2019   Bacteremia due to Klebsiella pneumoniae 06/21/2019   Lung infiltrate 06/20/2019   Sepsis due to urinary tract infection (HCC) 06/19/2019   Hyperkalemia 06/19/2019   Acute kidney injury (HCC) 06/19/2019   Type 2 diabetes mellitus with hyperglycemia, with long-term current use of insulin (HCC) 04/13/2017   Diabetes mellitus without complication (HCC) 12/01/2016   Former smoker 11/02/2015   Diabetes type 2, uncontrolled 06/18/2015   Hyperlipidemia associated with type 2 diabetes mellitus (HCC) 06/18/2015   BPH (benign prostatic hyperplasia) 06/18/2015   Self-catheterizes urinary bladder 06/18/2015    Hypertension associated with diabetes (HCC) 07/08/2010   CAD, NATIVE VESSEL 10/29/2009   AMI, INFERIOR WALL 10/22/2009    Past Surgical History:  Procedure Laterality Date   BACK SURGERY     CORONARY STENT PLACEMENT     CYSTOSCOPY W/ URETERAL STENT PLACEMENT  02/26/2012   Procedure: CYSTOSCOPY WITH RETROGRADE PYELOGRAM/URETERAL STENT PLACEMENT;  Surgeon: David S Grapey, MD;  Location: [facility];  Service: Urology;  Laterality: Left;   CYSTOSCOPY WITH RETROGRADE PYELOGRAM, URETEROSCOPY AND STENT PLACEMENT Left 08/07/2019   Procedure: CYSTOSCOPY WITH LEFT RETROGRADE PYELOGRAM, URETEROSCOPY HOLMIUM LASER AND STENT PLACEMENT;  Surgeon: Herrick, Benjamin W, MD;  Location: [facility];  Service: Urology;  Laterality: Left;   CYSTOSCOPY WITH STENT PLACEMENT Left 06/24/2019   Procedure: CYSTOSCOPY WITH STENT PLACEMENT LEFT URETER WITH LEFT RETROGRADE URETERAL;  Surgeon: Winter, Christopher Aaron, MD;  Location: [facility];  Service: Urology;  Laterality: Left;   testicles Right        Home Medications    Prior to Admission medications   Medication Sig Start Date End Date Taking? Authorizing Provider  Accu-Chek Softclix Lancets lancets Use as instructed to check blood glucose level by fingerstick twice per day. 04/26/21   Fleming, Zelda W, NP  Aspirin 81 MG CAPS Take 1 tablet by mouth daily.    [provider]  Blood Glucose Monitoring Suppl (ACCU-CHEK GUIDE) w/Device KIT Use as instructed to check blood glucose level by fingerstick twice per day 04/26/21   Fleming,   Zelda W, NP  dapagliflozin propanediol (FARXIGA) 10 MG TABS tablet Take 1 tablet (10 mg total) by mouth daily before breakfast. Patient needs PCP appointment for disease state management. 12/02/21   Newlin, Enobong, MD  ezetimibe (ZETIA) 10 MG tablet Take 1 tablet by mouth daily. 04/26/21 09/29/21  Fleming, Zelda W, NP  fexofenadine (ALLEGRA ALLERGY) 180 MG tablet Take 1 tablet (180 mg total) by mouth daily. 07/26/21   Newlin, Enobong, MD  glucose  blood (ACCU-CHEK GUIDE) test strip Use as instructed to check blood glucose by fingerstick twice per day. 04/26/21   Fleming, Zelda W, NP  insulin glargine (LANTUS) 100 UNIT/ML Solostar Pen Inject 60 Units into the skin daily. 04/26/21 01/22/22  Fleming, Zelda W, NP  Insulin Pen Needle (UNIFINE PENTIPS) 32G X 4 MM MISC Use to inject insulin and Victoza. 05/31/21   Newlin, Enobong, MD  liraglutide (VICTOZA) 18 MG/3ML SOPN Inject 1.8 mg into the skin daily. Please fill as a 90 day supply 04/26/21 07/25/21  Fleming, Zelda W, NP  lisinopril (ZESTRIL) 5 MG tablet Take 1 tablet (5 mg total) by mouth daily. Please fill as a 90 day supply 04/26/21 11/30/21  Fleming, Zelda W, NP  metFORMIN (GLUCOPHAGE) 1000 MG tablet Take 1 tablet by mouth 2 times daily with a meal. 04/26/21 09/29/21  Fleming, Zelda W, NP  Misc. Devices (CAREX COCCYX CUSHION) MISC Use daily when sitting for pressure relief ICD10 L98.429 Patient not taking: Reported on 04/26/2021 07/02/20   Fleming, Zelda W, NP  Misc. Devices MISC Please provide patient with standard sized 12F urinary catheters for self cathing along with lubricant. Patient not taking: Reported on 04/26/2021 05/29/18   Fleming, Zelda W, NP  omega-3 acid ethyl esters (LOVAZA) 1 g capsule Take 2 capsules (2 g total) by mouth 2 (two) times daily. Please fill as a 90 day supply 04/26/21 08/29/21  Fleming, Zelda W, NP  predniSONE (DELTASONE) 20 MG tablet Take 1 tablet (20 mg total) by mouth daily with breakfast. Patient not taking: Reported on 12/02/2021 07/26/21   Newlin, Enobong, MD  tamsulosin (FLOMAX) 0.4 MG CAPS capsule Take 2 capsules (0.8 mg total) by mouth daily after supper. Patient not taking: Reported on 11/27/2020 06/15/20   Manning, Matthew, MD  gemfibrozil (LOPID) 600 MG tablet Take 1 tablet (600 mg total) by mouth 2 (two) times daily before a meal. 07/20/19 11/04/19  Fleming, Zelda W, NP    Family History Family History  Problem Relation Age of Onset   Diabetes Father    Stomach cancer  Maternal Uncle    Diabetes Paternal Aunt    Coronary artery disease Other    Heart attack Other    Breast cancer Neg Hx    Colon cancer Neg Hx    Pancreatic cancer Neg Hx    Prostate cancer Neg Hx    Esophageal cancer Neg Hx    Liver disease Neg Hx     Social History Social History   Tobacco Use   Smoking status: Every Day    Types: E-cigarettes, Cigarettes   Smokeless tobacco: Former  Vaping Use   Vaping Use: Every day  Substance Use Topics   Alcohol use: Yes    Comment: occ   Drug use: No     Allergies   Lipitor [atorvastatin]   Review of Systems Review of Systems  Constitutional: Negative.   HENT: Negative.    Respiratory: Negative.    Cardiovascular: Negative.   Gastrointestinal: Negative.   Genitourinary:  Positive for dysuria.    Musculoskeletal: Negative.   Psychiatric/Behavioral: Negative.       Physical Exam Triage Vital Signs ED Triage Vitals  Enc Vitals Group     BP 12/02/21 1327 122/79     Pulse Rate 12/02/21 1327 94     Resp 12/02/21 1327 20     Temp 12/02/21 1327 98.1 F (36.7 C)     Temp Source 12/02/21 1327 Oral     SpO2 12/02/21 1327 97 %     Weight --      Height --      Head Circumference --      Peak Flow --      Pain Score 12/02/21 1324 2     Pain Loc --      Pain Edu? --      Excl. in [place]? --    No data found.  Updated Vital Signs BP 122/79 (BP Location: Left Arm)   Pulse 94   Temp 98.1 F (36.7 C) (Oral)   Resp 20   SpO2 97%   Visual Acuity Right Eye Distance:   Left Eye Distance:   Bilateral Distance:    Right Eye Near:   Left Eye Near:    Bilateral Near:     Physical Exam Constitutional:      Appearance: Normal appearance.  Cardiovascular:     Rate and Rhythm: Normal rate and regular rhythm.  Pulmonary:     Effort: Pulmonary effort is normal.     Breath sounds: Normal breath sounds.  Abdominal:     Palpations: Abdomen is soft.     Tenderness: There is no abdominal tenderness.  Musculoskeletal:      Cervical back: Normal range of motion.  Skin:    General: Skin is warm.  Neurological:     General: No focal deficit present.     Mental Status: He is alert.  Psychiatric:        Mood and Affect: Mood normal.     UC Treatments / Results  Labs (all labs ordered are listed, but only abnormal results are displayed) Labs Reviewed  POCT URINALYSIS DIPSTICK, ED / UC - Abnormal; Notable for the following components:      Result Value   Glucose, UA >=1000 (*)    Hgb urine dipstick MODERATE (*)    Nitrite POSITIVE (*)    Leukocytes,Ua SMALL (*)    All other components within normal limits  URINE CULTURE    EKG   Radiology No results found.  Procedures Procedures (including critical care time)  Medications Ordered in UC Medications - No data to display  Initial Impression / Assessment and Plan / UC Course  I have reviewed the triage vital signs and the nursing notes.  Pertinent labs & imaging results that were available during my care of the patient were reviewed by me and considered in my medical decision making (see chart for details).   Final Clinical Impressions(s) / UC Diagnoses   Final diagnoses:  Dysuria  Lower urinary tract infectious disease     Discharge Instructions      You were seen today for urinary symptoms.  I am treating you for a UTI with an antibiotic.  Start bactrim twice/day x 7 days.  We will send this for culture and will notify you if a change in antibiotic is needed.  Please follow up here or with your primary care provider if you are not improving as expected.     ED Prescriptions     Medication Sig  Dispense Auth. Provider   sulfamethoxazole-trimethoprim (BACTRIM DS) 800-160 MG tablet Take 1 tablet by mouth 2 (two) times daily for 7 days. 14 tablet Piontek, Erin, MD      PDMP not reviewed this encounter.   Piontek, Erin, MD 12/02/21 1415  

## 2021-12-02 NOTE — Discharge Instructions (Addendum)
You were seen today for urinary symptoms.  I am treating you for a UTI with an antibiotic.  Start bactrim twice/day x 7 days.  We will send this for culture and will notify you if a change in antibiotic is needed.  Please follow up here or with your primary care provider if you are not improving as expected.

## 2021-12-02 NOTE — ED Notes (Signed)
Patient does not have any new catheters in department with him.  Limited options in department

## 2021-12-02 NOTE — ED Triage Notes (Signed)
History of uti.  This started 1-2 days ago.  Patient has burning with urination and has noticed an odor to urine

## 2021-12-02 NOTE — ED Notes (Signed)
Patient was able to obtain a catheter specimen.  Placed specimen in lab.

## 2021-12-02 NOTE — ED Notes (Signed)
When asked about getting a urine specimen, patient reports he self caths.  No clean equipment with him.  Spoke to Dr Eustace Moore.

## 2021-12-04 LAB — URINE CULTURE: Culture: >=100000 — AB

## 2021-12-27 ENCOUNTER — Other Ambulatory Visit: Payer: Self-pay | Admitting: Family Medicine

## 2021-12-27 ENCOUNTER — Other Ambulatory Visit: Payer: Self-pay

## 2021-12-27 ENCOUNTER — Other Ambulatory Visit (HOSPITAL_COMMUNITY): Payer: Self-pay

## 2021-12-27 DIAGNOSIS — E1165 Type 2 diabetes mellitus with hyperglycemia: Secondary | ICD-10-CM

## 2021-12-27 MED ORDER — TECHLITE PEN NEEDLES 32G X 4 MM MISC
0 refills | Status: DC
  Filled 2021-12-27: qty 100, 30d supply, fill #0
  Filled 2021-12-27: qty 100, fill #0

## 2021-12-27 MED ORDER — DAPAGLIFLOZIN PROPANEDIOL 10 MG PO TABS
10.0000 mg | ORAL_TABLET | Freq: Every day | ORAL | 0 refills | Status: DC
  Filled 2021-12-27 (×2): qty 30, 30d supply, fill #0

## 2022-01-14 ENCOUNTER — Other Ambulatory Visit: Payer: Self-pay

## 2022-02-01 ENCOUNTER — Other Ambulatory Visit: Payer: Self-pay | Admitting: Family Medicine

## 2022-02-01 ENCOUNTER — Other Ambulatory Visit: Payer: Self-pay

## 2022-02-01 DIAGNOSIS — E1165 Type 2 diabetes mellitus with hyperglycemia: Secondary | ICD-10-CM

## 2022-02-04 ENCOUNTER — Other Ambulatory Visit: Payer: Self-pay

## 2022-02-07 ENCOUNTER — Other Ambulatory Visit: Payer: Self-pay

## 2022-02-10 ENCOUNTER — Ambulatory Visit: Payer: Medicaid Other | Attending: Physician Assistant | Admitting: Physician Assistant

## 2022-02-10 ENCOUNTER — Other Ambulatory Visit (HOSPITAL_COMMUNITY)
Admission: RE | Admit: 2022-02-10 | Discharge: 2022-02-10 | Disposition: A | Discharge status: Home / Self Care | Payer: Medicaid Other | Source: Ambulatory Visit | Attending: Physician Assistant | Admitting: Physician Assistant

## 2022-02-10 ENCOUNTER — Encounter: Payer: Self-pay | Admitting: Physician Assistant

## 2022-02-10 ENCOUNTER — Other Ambulatory Visit: Payer: Self-pay

## 2022-02-10 VITALS — BP 125/78 | HR 84 | Wt 247.6 lb

## 2022-02-10 DIAGNOSIS — N342 Other urethritis: Secondary | ICD-10-CM | POA: Insufficient documentation

## 2022-02-10 DIAGNOSIS — E1165 Type 2 diabetes mellitus with hyperglycemia: Secondary | ICD-10-CM

## 2022-02-10 DIAGNOSIS — E785 Hyperlipidemia, unspecified: Secondary | ICD-10-CM

## 2022-02-10 DIAGNOSIS — E1169 Type 2 diabetes mellitus with other specified complication: Secondary | ICD-10-CM

## 2022-02-10 DIAGNOSIS — Z794 Long term (current) use of insulin: Secondary | ICD-10-CM

## 2022-02-10 DIAGNOSIS — R051 Acute cough: Secondary | ICD-10-CM

## 2022-02-10 LAB — POCT URINALYSIS DIP (CLINITEK)
Bilirubin, UA: NEGATIVE
Blood, UA: NEGATIVE
Glucose, UA: 500 mg/dL — AB
Ketones, POC UA: NEGATIVE mg/dL
Nitrite, UA: POSITIVE — AB
POC PROTEIN,UA: NEGATIVE
Spec Grav, UA: 1.02 (ref 1.010–1.025)
Urobilinogen, UA: 0.2 E.U./dL
pH, UA: 7 (ref 5.0–8.0)

## 2022-02-10 LAB — POCT GLYCOSYLATED HEMOGLOBIN (HGB A1C): HbA1c, POC (controlled diabetic range): 7.5 % — AB (ref 0.0–7.0)

## 2022-02-10 LAB — GLUCOSE, POCT (MANUAL RESULT ENTRY): POC Glucose: 188 mg/dl — AB (ref 70–99)

## 2022-02-10 MED ORDER — INSULIN GLARGINE 100 UNIT/ML SOLOSTAR PEN
33.0000 [IU] | PEN_INJECTOR | Freq: Two times a day (BID) | SUBCUTANEOUS | 3 refills | Status: DC
  Filled 2022-02-10: qty 54, fill #0
  Filled 2022-02-24: qty 54, 83d supply, fill #0
  Filled 2022-02-28: qty 54, fill #0
  Filled 2022-03-01: qty 54, 82d supply, fill #0

## 2022-02-10 MED ORDER — TECHLITE PEN NEEDLES 32G X 4 MM MISC
Freq: Two times a day (BID) | 3 refills | Status: DC
  Filled 2022-02-10 (×2): qty 100, 50d supply, fill #0
  Filled 2022-03-31: qty 100, 50d supply, fill #1
  Filled 2022-05-09: qty 100, 50d supply, fill #2
  Filled 2022-07-15: qty 100, 50d supply, fill #3

## 2022-02-10 MED ORDER — LISINOPRIL 5 MG PO TABS
5.0000 mg | ORAL_TABLET | Freq: Every day | ORAL | 3 refills | Status: DC
  Filled 2022-02-10 – 2022-02-24 (×3): qty 90, 90d supply, fill #0

## 2022-02-10 MED ORDER — DOXYCYCLINE HYCLATE 100 MG PO TABS
100.0000 mg | ORAL_TABLET | Freq: Two times a day (BID) | ORAL | 0 refills | Status: DC
  Filled 2022-02-10 (×2): qty 20, 10d supply, fill #0

## 2022-02-10 MED ORDER — OSELTAMIVIR PHOSPHATE 75 MG PO CAPS
75.0000 mg | ORAL_CAPSULE | Freq: Every day | ORAL | 0 refills | Status: DC
  Filled 2022-02-10 (×2): qty 10, 10d supply, fill #0

## 2022-02-10 MED ORDER — OMEGA-3-ACID ETHYL ESTERS 1 G PO CAPS
2.0000 | ORAL_CAPSULE | Freq: Two times a day (BID) | ORAL | 3 refills | Status: DC
  Filled 2022-02-10 (×2): qty 360, 90d supply, fill #0

## 2022-02-10 MED ORDER — DAPAGLIFLOZIN PROPANEDIOL 10 MG PO TABS
10.0000 mg | ORAL_TABLET | Freq: Every day | ORAL | 3 refills | Status: DC
  Filled 2022-02-10 (×3): qty 30, 30d supply, fill #0
  Filled 2022-03-10: qty 30, 30d supply, fill #1
  Filled 2022-03-10 – 2022-03-15 (×4): qty 30, 30d supply, fill #0
  Filled 2022-04-12: qty 30, 30d supply, fill #1
  Filled 2022-05-09: qty 30, 30d supply, fill #2

## 2022-02-10 MED ORDER — DAPAGLIFLOZIN PROPANEDIOL 10 MG PO TABS
10.0000 mg | ORAL_TABLET | Freq: Every day | ORAL | 0 refills | Status: DC
  Filled 2022-02-10: qty 30, 30d supply, fill #0

## 2022-02-10 MED ORDER — TECHLITE PEN NEEDLES 32G X 4 MM MISC
0 refills | Status: DC
  Filled 2022-02-10: qty 100, fill #0

## 2022-02-10 NOTE — Progress Notes (Signed)
Patient ID: Shane Bond, male   DOB: 01/18/1961, 61 y.o.   MRN: [id number]     Maximos Basey, is a 61 y.o. male  CSN:[id number]  MRN:[id number]  DOB - 07/12/1960  Chief Complaint  Patient presents with   Medication Refill   Diabetes       Subjective:   Stoney Bisesi is a 61 y.o. male here today for painful urination at times, Blood sugars 90-250, and med RF.    S/p prostate treatment so on and off he has has burning with urination for about a year.  It has seemed more persistent over the last month or two.  Denies penile discharge  Also exposed to flu about 1 week ago.  He has been having cough and congestion since before being around the person that had flu.  No fever.  No body aches.       No problems updated.  ALLERGIES: Allergies  Allergen Reactions   Lipitor [Atorvastatin] Rash    PAST MEDICAL HISTORY: 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   Anemia    Bladder atonia    BPH (benign prostatic hyperplasia)    CAD, NATIVE VESSEL 10/29/2009   Diabetes mellitus type 2, uncontrolled, with complications    Hyperlipidemia LDL goal <70    Hypertension    Kidney stone    Neurogenic bladder as late effect of cerebrovascular accident (CVA)    OBESITY 10/22/2009   Prostate cancer (HCC)     MEDICATIONS AT HOME: Prior to Admission medications   Medication Sig Start Date End Date Taking? Authorizing Provider  Accu-Chek Softclix Lancets lancets Use as instructed to check blood glucose level by fingerstick twice per day. 04/26/21  Yes Fleming, Zelda W, NP  Aspirin 81 MG CAPS Take 1 tablet by mouth daily.   Yes [provider]  Blood Glucose Monitoring Suppl (ACCU-CHEK GUIDE) w/Device KIT Use as instructed to check blood glucose level by fingerstick twice per day 04/26/21  Yes Fleming, Zelda W, NP  doxycycline (VIBRA-TABS) 100 MG tablet Take 1 tablet (100 mg total) by mouth 2 (two) times daily. 02/10/22  Yes McClung, Angela M, PA-C   glucose blood (ACCU-CHEK GUIDE) test strip Use as instructed to check blood glucose by fingerstick twice per day. 04/26/21  Yes Fleming, Zelda W, NP  dapagliflozin propanediol (FARXIGA) 10 MG TABS tablet Take 1 tablet (10 mg total) by mouth daily before breakfast. Patient needs PCP appointment for disease state management. 02/10/22   McClung, Angela M, PA-C  fexofenadine (ALLEGRA ALLERGY) 180 MG tablet Take 1 tablet (180 mg total) by mouth daily. Patient not taking: Reported on 02/10/2022 07/26/21   Newlin, Enobong, MD  insulin glargine (LANTUS) 100 UNIT/ML Solostar Pen 33 units in the morning and 32 units at bedtime 02/10/22   McClung, Angela M, PA-C  Insulin Pen Needle (TECHLITE PEN NEEDLES) 32G X 4 MM MISC Inject as directed 2 (two) times daily. 02/10/22   McClung, Angela M, PA-C  lisinopril (ZESTRIL) 5 MG tablet Take 1 tablet (5 mg total) by mouth daily. Please fill as a 90 day supply 02/10/22 05/11/22  McClung, Angela M, PA-C  Misc. Devices (CAREX COCCYX CUSHION) MISC Use daily when sitting for pressure relief ICD10 L98.429 Patient not taking: Reported on 04/26/2021 07/02/20   Fleming, Zelda W, NP  Misc. Devices MISC Please provide patient with standard sized 12F urinary catheters for self cathing along with lubricant. Patient not taking: Reported on 02/10/2022 05/29/18   Fleming, Zelda   W, NP  omega-3 acid ethyl esters (LOVAZA) 1 g capsule Take 2 capsules (2 g total) by mouth 2 (two) times daily. Please fill as a 90 day supply 02/10/22 05/11/22  Argentina Donovan, PA-C  gemfibrozil (LOPID) 600 MG tablet Take 1 tablet (600 mg total) by mouth 2 (two) times daily before a meal. 07/20/19 11/04/19  Gildardo Pounds, NP    ROS: Neg HEENT Neg cardiac Neg GI Neg MS Neg psych Neg neuro  Objective:   Vitals:   02/10/22 0942  BP: 125/78  Pulse: 84  SpO2: 96%  Weight: 247 lb 9.6 oz (112.3 kg)   Exam General appearance : Awake, alert, not in any distress. Speech Clear. Not toxic looking HEENT:  Atraumatic and Normocephalic Neck: Supple, no JVD. No cervical lymphadenopathy.  Chest: Good air entry bilaterally, CTAB.  No rales/rhonchi/wheezing CVS: S1 S2 regular, no murmurs.  Extremities: B/L Lower Ext shows no edema, both legs are warm to touch Neurology: Awake alert, and oriented X 3, CN II-XII intact, Non focal Skin: No Rash  Data Review Lab Results  Component Value Date   HGBA1C 7.5 (A) 02/10/2022   HGBA1C 6.6 (A) 04/26/2021   HGBA1C 7.0 (A) 11/27/2020    Assessment & Plan   1. Type 2 diabetes mellitus with hyperglycemia, with long-term current use of insulin (HCC) Uncontrolled-divide dose of lantus-he was taking 60 units daily- He does not want to take metformin and he has not taken it in a while and he said victoza did not agree with him.   - Glucose (CBG) - HgB A1c - POCT URINALYSIS DIP (CLINITEK) - insulin glargine (LANTUS) 100 UNIT/ML Solostar Pen; 33 units in the morning and 32 units at bedtime  Dispense: 54 mL; Refill: 3 - lisinopril (ZESTRIL) 5 MG tablet; Take 1 tablet (5 mg total) by mouth daily. Please fill as a 90 day supply  Dispense: 90 tablet; Refill: 3 - dapagliflozin propanediol (FARXIGA) 10 MG TABS tablet; Take 1 tablet (10 mg total) by mouth daily before breakfast. Patient needs PCP appointment for disease state management.  Dispense: 30 tablet; Refill: 3 - Insulin Pen Needle (TECHLITE PEN NEEDLES) 32G X 4 MM MISC; Inject as directed 2 (two) times daily.  Dispense: 100 each; Refill: 3 - CBC with Differential/Platelet - Comprehensive metabolic panel  2. Hyperlipidemia associated with type 2 diabetes mellitus (HCC) - omega-3 acid ethyl esters (LOVAZA) 1 g capsule; Take 2 capsules (2 g total) by mouth 2 (two) times daily. Please fill as a 90 day supply  Dispense: 360 capsule; Refill: 3 - CBC with Differential/Platelet - Comprehensive metabolic panel  3. Infective urethritis Increase water intake.   - Urine cytology ancillary only - Urine Culture -  doxycycline (VIBRA-TABS) 100 MG tablet; Take 1 tablet (100 mg total) by mouth 2 (two) times daily.  Dispense: 20 tablet; Refill: 0  4. Exposure to flu-tamiflu 6m daily X 10 days  Return for 1 month with LLurena Joinerand 3 months with PCP.  The patient was given clear instructions to go to ER or return to medical center if symptoms don't improve, worsen or new problems develop. The patient verbalized understanding. The patient was told to call to get lab results if they haven't heard anything in the next week.      AFreeman Caldron PA-C [facility]  02/10/2022, 10:26 AM

## 2022-02-10 NOTE — Patient Instructions (Signed)
Check blood sugars fasting and bedtime and bring to next visit

## 2022-02-11 LAB — CBC WITH DIFFERENTIAL/PLATELET
Basophils Absolute: 0.1 10*3/uL (ref 0.0–0.2)
Basos: 1 %
EOS (ABSOLUTE): 0.2 10*3/uL (ref 0.0–0.4)
Eos: 4 %
Hematocrit: 39 % (ref 37.5–51.0)
Hemoglobin: 13.6 g/dL (ref 13.0–17.7)
Immature Grans (Abs): 0 10*3/uL (ref 0.0–0.1)
Immature Granulocytes: 0 %
Lymphocytes Absolute: 1.2 10*3/uL (ref 0.7–3.1)
Lymphs: 22 %
MCH: 33 pg (ref 26.6–33.0)
MCHC: 34.9 g/dL (ref 31.5–35.7)
MCV: 95 fL (ref 79–97)
Monocytes Absolute: 0.6 10*3/uL (ref 0.1–0.9)
Monocytes: 11 %
Neutrophils Absolute: 3.3 10*3/uL (ref 1.4–7.0)
Neutrophils: 62 %
Platelets: 184 10*3/uL (ref 150–450)
RBC: 4.12 x10E6/uL — ABNORMAL LOW (ref 4.14–5.80)
RDW: 12.7 % (ref 11.6–15.4)
WBC: 5.4 10*3/uL (ref 3.4–10.8)

## 2022-02-11 LAB — URINE CYTOLOGY ANCILLARY ONLY
Chlamydia: NEGATIVE
Comment: NEGATIVE
Comment: NEGATIVE
Comment: NORMAL
Neisseria Gonorrhea: NEGATIVE
Trichomonas: NEGATIVE

## 2022-02-11 LAB — COMPREHENSIVE METABOLIC PANEL
ALT: 46 IU/L — ABNORMAL HIGH (ref 0–44)
AST: 28 IU/L (ref 0–40)
Albumin/Globulin Ratio: 1.3 (ref 1.2–2.2)
Albumin: 4 g/dL (ref 3.8–4.9)
Alkaline Phosphatase: 104 IU/L (ref 44–121)
BUN/Creatinine Ratio: 18 (ref 10–24)
BUN: 15 mg/dL (ref 8–27)
Bilirubin Total: 0.3 mg/dL (ref 0.0–1.2)
CO2: 23 mmol/L (ref 20–29)
Calcium: 9.6 mg/dL (ref 8.6–10.2)
Chloride: 102 mmol/L (ref 96–106)
Creatinine, Ser: 0.85 mg/dL (ref 0.76–1.27)
Globulin, Total: 3.2 g/dL (ref 1.5–4.5)
Glucose: 180 mg/dL — ABNORMAL HIGH (ref 70–99)
Potassium: 4.6 mmol/L (ref 3.5–5.2)
Sodium: 137 mmol/L (ref 134–144)
Total Protein: 7.2 g/dL (ref 6.0–8.5)
eGFR: 99 mL/min/{1.73_m2} (ref >59)

## 2022-02-14 ENCOUNTER — Other Ambulatory Visit: Payer: Self-pay | Admitting: Physician Assistant

## 2022-02-16 LAB — URINE CULTURE

## 2022-02-24 ENCOUNTER — Other Ambulatory Visit: Payer: Self-pay

## 2022-02-28 ENCOUNTER — Other Ambulatory Visit: Payer: Self-pay

## 2022-03-01 ENCOUNTER — Other Ambulatory Visit: Payer: Self-pay

## 2022-03-10 ENCOUNTER — Other Ambulatory Visit: Payer: Self-pay

## 2022-03-10 ENCOUNTER — Other Ambulatory Visit (HOSPITAL_COMMUNITY): Payer: Self-pay

## 2022-03-15 ENCOUNTER — Other Ambulatory Visit: Payer: Self-pay

## 2022-03-15 ENCOUNTER — Other Ambulatory Visit (HOSPITAL_COMMUNITY): Payer: Self-pay

## 2022-03-16 NOTE — Progress Notes (Signed)
S:     PCP: [name]  62 y.o. male who presents for diabetes evaluation, education, and management.  PMH is significant for MI, T2DM, BPH, prostate cancer, and HLD, obesity.  Patient was referred and last seen by Primary Care Provider, Freeman Caldron, on 02/10/2022. Last visit with his PCP was in 04/2021.   At last visit, A1c was uptrending to 7.5%. He reported that he had self-discontinued his metformin and Victoza. His Lantus dose was increased and split at that time.  Today, patient arrives in good spirits and presents without any assistance.   Patient reports Diabetes is long-standing.  Family/Social History:  -Fhx:DM, CAD, MI -Tobacco: pack last ~2 days  -Alcohol: denies  Current diabetes medications include: Lantus 33 units QAM 32 units QPM, Farxiga 41m once daily Current hypertension medications include: lisinopril 541monce daily Current hyperlipidemia medications include: Lovaza 2g BID  Patient reports adherence to taking all medications as prescribed. He reports that he has missed ~ one dose of his insulin since his previous visit in December 2023. He reported a frequent cough with his lisinopril. He was unable to confirm any side effects from Victoza or metformin that were previously discontinued. Patient reported a rash on his entire arm that developed one day after initiation of Lipitor and resolved 2 days after discontinuation.   Insurance coverage: [place] [employer]  Patient denies hypoglycemic events.  Reported home fasting blood sugars: 130-140s   Patient denies nocturia (nighttime urination).  Patient reports neuropathy (nerve pain). Patient denies visual changes. Patient reports self foot exams.   Patient reported dietary habits:  -Patient stated typical diet consists of beans, potatoes, tomatoes, lettuce, mac & cheese -infrequent sugar intake and only drinks sugar-free drinks  Patient-reported exercise habits:  -limited d/t his back pain    O:    Vitals:   03/17/22 1403  BP: 115/68  Pulse: 90     Lab Results  Component Value Date   HGBA1C 7.5 (A) 02/10/2022   There were no vitals filed for this visit.  Lipid Panel     Component Value Date/Time   CHOL 157 04/26/2021 1425   TRIG 248 (H) 04/26/2021 1425   HDL 36 (L) 04/26/2021 1425   CHOLHDL 4.4 04/26/2021 1425   CHOLHDL 5.1 (H) 06/18/2015 1116   VLDL 32 (H) 06/18/2015 1116   LDLCALC 80 04/26/2021 1425   LDLDIRECT 87.7 11/09/2009 1026    Clinical Atherosclerotic Cardiovascular Disease (ASCVD): Yes  The ASCVD Risk score (Arnett DK, et al., 2019) failed to calculate for the following reasons:   The patient has a prior MI or stroke diagnosis    A/P: Diabetes longstanding currently close to goal based on recent A1c and home readings. Patient is able to verbalize appropriate hypoglycemia management plan. Medication adherence appears appropriate. Patient appears to be a poor historian. Given his complexities, will work by altering his current medication therapy that is on board.  -Increased dose of basal insulin Lantus to 34units BID.  -Continued SGLT2-I Farxiga 1042mnce daily -Patient educated on purpose, proper use, and potential adverse effects of insulin.  -Extensively discussed pathophysiology of diabetes, recommended lifestyle interventions, dietary effects on blood sugar control.  -Counseled on s/sx of and management of hypoglycemia.  -Next A1c anticipated March 2023.   ASCVD risk - secondary prevention in patient with diabetes. Last LDL is 80 not at goal of <70 mg/dL. High intensity statin indicated, however, given his history of a rash, will hold off at this time. If the  patient is in a monitored setting, could be beneficial to trial statin initiation.  -Continued Lovaza 2g BID  Hypertension- currently controlled based on blood pressure in clinic. BP goal <130/80. Given his side effects from lisinopril, will change him to another RAAS agent. -Discontinue  lisinopril -Initiated losartan 57m once daily  Written patient instructions provided. Patient verbalized understanding of treatment plan.  Total time in face to face counseling 15 minutes.    Follow-up:  Pharmacist in one month. PCP clinic visit in 05/13/2022  AMaryan Puls PharmD PGY-1 [facility] HospitalPharmacy Resident

## 2022-03-17 ENCOUNTER — Ambulatory Visit: Payer: Medicaid Other | Attending: Nurse Practitioner | Admitting: Pharmacist

## 2022-03-17 ENCOUNTER — Other Ambulatory Visit: Payer: Self-pay

## 2022-03-17 DIAGNOSIS — E1165 Type 2 diabetes mellitus with hyperglycemia: Secondary | ICD-10-CM

## 2022-03-17 DIAGNOSIS — Z794 Long term (current) use of insulin: Secondary | ICD-10-CM

## 2022-03-17 MED ORDER — INSULIN GLARGINE 100 UNIT/ML SOLOSTAR PEN
34.0000 [IU] | PEN_INJECTOR | Freq: Two times a day (BID) | SUBCUTANEOUS | 1 refills | Status: DC
  Filled 2022-03-17: qty 60, 88d supply, fill #0

## 2022-03-17 MED ORDER — LOSARTAN POTASSIUM 25 MG PO TABS
25.0000 mg | ORAL_TABLET | Freq: Every day | ORAL | 1 refills | Status: DC
  Filled 2022-03-17 (×2): qty 90, 90d supply, fill #0
  Filled 2022-06-09: qty 90, 90d supply, fill #1

## 2022-03-23 ENCOUNTER — Other Ambulatory Visit (HOSPITAL_COMMUNITY): Payer: Self-pay

## 2022-03-26 ENCOUNTER — Other Ambulatory Visit (HOSPITAL_COMMUNITY): Payer: Self-pay

## 2022-03-31 ENCOUNTER — Other Ambulatory Visit: Payer: Self-pay

## 2022-04-11 ENCOUNTER — Other Ambulatory Visit (HOSPITAL_COMMUNITY): Payer: Self-pay

## 2022-04-12 ENCOUNTER — Other Ambulatory Visit: Payer: Self-pay

## 2022-04-18 ENCOUNTER — Other Ambulatory Visit: Payer: Self-pay | Admitting: Nurse Practitioner

## 2022-04-18 ENCOUNTER — Other Ambulatory Visit: Payer: Self-pay

## 2022-04-18 ENCOUNTER — Ambulatory Visit: Payer: Medicaid Other | Attending: Family Medicine | Admitting: Pharmacist

## 2022-04-18 ENCOUNTER — Telehealth: Payer: Self-pay | Admitting: Pharmacist

## 2022-04-18 DIAGNOSIS — T466X5A Adverse effect of antihyperlipidemic and antiarteriosclerotic drugs, initial encounter: Secondary | ICD-10-CM

## 2022-04-18 DIAGNOSIS — G720 Drug-induced myopathy: Secondary | ICD-10-CM | POA: Diagnosis not present

## 2022-04-18 DIAGNOSIS — E1165 Type 2 diabetes mellitus with hyperglycemia: Secondary | ICD-10-CM | POA: Diagnosis not present

## 2022-04-18 DIAGNOSIS — Z794 Long term (current) use of insulin: Secondary | ICD-10-CM

## 2022-04-18 MED ORDER — INSULIN GLARGINE 100 UNIT/ML SOLOSTAR PEN
30.0000 [IU] | PEN_INJECTOR | Freq: Two times a day (BID) | SUBCUTANEOUS | 1 refills | Status: DC
  Filled 2022-04-18: qty 60, 100d supply, fill #0
  Filled 2022-05-04: qty 18, 30d supply, fill #0

## 2022-04-18 MED ORDER — OZEMPIC (0.25 OR 0.5 MG/DOSE) 2 MG/3ML ~~LOC~~ SOPN
0.2500 mg | PEN_INJECTOR | SUBCUTANEOUS | 1 refills | Status: DC
  Filled 2022-04-18: qty 3, 42d supply, fill #0

## 2022-04-18 MED ORDER — NITROFURANTOIN MONOHYD MACRO 100 MG PO CAPS: 100.0000 mg | ORAL_CAPSULE | Freq: Two times a day (BID) | ORAL | 0 refills | Status: AC

## 2022-04-18 NOTE — Telephone Encounter (Signed)
New abx sent to pharmacy. Will recheck urine at next office visit. Is he seeing urology?

## 2022-04-18 NOTE — Telephone Encounter (Signed)
[place] friend. We saw this patient for DM management. He self-catheterizes and has a hx of recurrent UTI. Was seen in 01/2022 and urinecx showed Enterobacter that was R to tetracyclines. He was prescribed doxycycline at that time by another provider. He is still endorsing symptoms today. Would you consider sending a different abx such as NFT or a quinolone? Please see his urine culture from 12/21.

## 2022-04-18 NOTE — Progress Notes (Signed)
S:    PCP: [name]  62 y.o. male who presents for diabetes evaluation, education, and management.  PMH is significant for MI, T2DM, BPH, prostate cancer, and HLD, obesity.  Patient was referred and last seen by Primary Care Provider, Freeman Caldron, on 02/10/2022. Last visit with his PCP was in 04/2021.   Today, patient arrives in good spirits and presents without any assistance. Patient does endorse some urinary symptoms over the past 2 days including burning sensation and cloudy, odorous urine. This appears to a chronic issue. Will collaborate with PCP regarding symptoms.   Patient reports Diabetes is long-standing.  Family/Social History:  -Fhx:DM, CAD, MI -Tobacco: pack last ~2 days  -Alcohol: denies  Current diabetes medications include: Lantus 34 units BID, Farxiga '10mg'$  once daily Current hypertension medications include: losartan 25 mg daily Current hyperlipidemia medications include: Lovaza 2g BID  Patient reports adherence to taking all medications as prescribed.  Insurance coverage: [place] [employer]  Patient denies hypoglycemic events.  Reported home fasting blood sugars: 138 mg/dL Midday: 130 mg/dL At night: 150-170, >200 once/week  Patient reports nocturia (nighttime urination).  Patient reports neuropathy (nerve pain). Patient denies visual changes. Patient reports self foot exams.   Patient reported dietary habits:  -Patient stated typical diet consists of beans, potatoes, tomatoes, lettuce, mac & cheese -infrequent sugar intake and only drinks sugar-free drinks  Patient-reported exercise habits:  -limited d/t his back pain    O:   Vitals:   04/18/22 1105  BP: 106/67  Pulse: 87    Lab Results  Component Value Date   HGBA1C 7.5 (A) 02/10/2022   Vitals:   04/18/22 1105  BP: 106/67  Pulse: 87    Lipid Panel     Component Value Date/Time   CHOL 157 04/26/2021 1425   TRIG 248 (H) 04/26/2021 1425   HDL 36 (L) 04/26/2021 1425   CHOLHDL  4.4 04/26/2021 1425   CHOLHDL 5.1 (H) 06/18/2015 1116   VLDL 32 (H) 06/18/2015 1116   LDLCALC 80 04/26/2021 1425   LDLDIRECT 87.7 11/09/2009 1026    Clinical Atherosclerotic Cardiovascular Disease (ASCVD): Yes  The ASCVD Risk score (Arnett DK, et al., 2019) failed to calculate for the following reasons:   The patient has a prior MI or stroke diagnosis    A/P: Diabetes longstanding currently close to goal based on recent A1c and home readings. Patient is able to verbalize appropriate hypoglycemia management plan. Medication adherence appears appropriate.  -Decreased dose of basal insulin Lantus to 30 units BID.  -Started GLP-1 Ozempic 0.25 mg weekly.  -Continued SGLT2-I Farxiga '10mg'$  once daily. Given recurrent history of UTIs, may consider discontinuing Farxiga at follow up if patient was able to start and tolerate Ozempic. -Patient educated on purpose, proper use, and potential adverse effects of Ozempic, Farxiga, and insulin.  -Extensively discussed pathophysiology of diabetes, recommended lifestyle interventions, dietary effects on blood sugar control.  -Counseled on s/sx of and management of hypoglycemia.  -Next A1c anticipated March 2023.   ASCVD risk - secondary prevention in patient with diabetes. Last LDL is 80 not at goal of <70 mg/dL. High intensity statin indicated, however, history of a rash with atorvastatin. If the patient is in a monitored setting, could be beneficial to trial statin initiation.  -Continued Lovaza 2g BID  Hypertension- currently controlled based on blood pressure in clinic. BP goal <130/80. Lisinopril recently changed to losartan.  -Continued losartan '25mg'$  once daily  Written patient instructions provided. Patient verbalized understanding of treatment plan.  Total time in face to face counseling 20 minutes.    Follow-up:  Pharmacist PRN PCP clinic visit in 05/13/2022  Joseph Art, [place].D. PGY-2 Ambulatory Care Pharmacy Resident

## 2022-04-19 NOTE — Telephone Encounter (Signed)
Unable to reach patient by phone to relay results.  Voicemail left to return call to our office.

## 2022-04-20 ENCOUNTER — Telehealth: Payer: Self-pay | Admitting: *Deleted

## 2022-04-20 ENCOUNTER — Other Ambulatory Visit: Payer: Self-pay

## 2022-04-20 NOTE — Telephone Encounter (Signed)
Message from PCP below: Encounter Date: 04/18/2022   Signed      New abx sent to pharmacy. Will recheck urine at next office visit. Is he seeing urology?          Patient aware that new Rx was sent to pharmacy.   He states he does not have a urologist, only an nephrologist.

## 2022-04-22 NOTE — Telephone Encounter (Signed)
His urologist is who saw him for his prostate cancer

## 2022-04-22 NOTE — Telephone Encounter (Signed)
Patient aware of response and voiced understanding that he does in fact have a urologist. He did not know what that work meant.

## 2022-05-04 ENCOUNTER — Other Ambulatory Visit: Payer: Self-pay

## 2022-05-05 ENCOUNTER — Other Ambulatory Visit: Payer: Self-pay

## 2022-05-09 ENCOUNTER — Other Ambulatory Visit: Payer: Self-pay | Admitting: Nurse Practitioner

## 2022-05-09 ENCOUNTER — Other Ambulatory Visit: Payer: Self-pay

## 2022-05-09 DIAGNOSIS — E1165 Type 2 diabetes mellitus with hyperglycemia: Secondary | ICD-10-CM

## 2022-05-09 DIAGNOSIS — Z794 Long term (current) use of insulin: Secondary | ICD-10-CM

## 2022-05-09 MED ORDER — ACCU-CHEK GUIDE VI STRP
ORAL_STRIP | 2 refills | Status: DC
  Filled 2022-05-09 – 2022-05-11 (×2): qty 100, 50d supply, fill #0
  Filled 2022-06-17: qty 100, 50d supply, fill #1

## 2022-05-11 ENCOUNTER — Other Ambulatory Visit: Payer: Self-pay

## 2022-05-13 ENCOUNTER — Ambulatory Visit: Payer: Medicaid Other | Attending: Nurse Practitioner | Admitting: Nurse Practitioner

## 2022-05-13 ENCOUNTER — Encounter: Payer: Self-pay | Admitting: Nurse Practitioner

## 2022-05-13 ENCOUNTER — Other Ambulatory Visit: Payer: Self-pay

## 2022-05-13 ENCOUNTER — Other Ambulatory Visit (HOSPITAL_COMMUNITY)
Admission: RE | Admit: 2022-05-13 | Discharge: 2022-05-13 | Disposition: A | Discharge status: Home / Self Care | Payer: Medicaid Other | Source: Ambulatory Visit | Attending: Nurse Practitioner | Admitting: Nurse Practitioner

## 2022-05-13 VITALS — BP 111/65 | HR 92 | Ht 73.0 in | Wt 225.4 lb

## 2022-05-13 DIAGNOSIS — Z794 Long term (current) use of insulin: Secondary | ICD-10-CM | POA: Diagnosis not present

## 2022-05-13 DIAGNOSIS — I10 Essential (primary) hypertension: Secondary | ICD-10-CM | POA: Diagnosis not present

## 2022-05-13 DIAGNOSIS — Z23 Encounter for immunization: Secondary | ICD-10-CM

## 2022-05-13 DIAGNOSIS — R399 Unspecified symptoms and signs involving the genitourinary system: Secondary | ICD-10-CM

## 2022-05-13 DIAGNOSIS — E1169 Type 2 diabetes mellitus with other specified complication: Secondary | ICD-10-CM

## 2022-05-13 DIAGNOSIS — C61 Malignant neoplasm of prostate: Secondary | ICD-10-CM

## 2022-05-13 DIAGNOSIS — E1165 Type 2 diabetes mellitus with hyperglycemia: Secondary | ICD-10-CM | POA: Diagnosis not present

## 2022-05-13 DIAGNOSIS — E785 Hyperlipidemia, unspecified: Secondary | ICD-10-CM

## 2022-05-13 DIAGNOSIS — D696 Thrombocytopenia, unspecified: Secondary | ICD-10-CM

## 2022-05-13 LAB — POCT GLYCOSYLATED HEMOGLOBIN (HGB A1C): Hemoglobin A1C: 7.2 % — AB (ref 4.0–5.6)

## 2022-05-13 MED ORDER — INSULIN GLARGINE 100 UNIT/ML SOLOSTAR PEN
30.0000 [IU] | PEN_INJECTOR | Freq: Two times a day (BID) | SUBCUTANEOUS | 1 refills | Status: DC
  Filled 2022-05-13 – 2022-05-30 (×3): qty 54, 90d supply, fill #0

## 2022-05-13 MED ORDER — OZEMPIC (0.25 OR 0.5 MG/DOSE) 2 MG/3ML ~~LOC~~ SOPN
0.5000 mg | PEN_INJECTOR | SUBCUTANEOUS | 1 refills | Status: DC
  Filled 2022-05-13 – 2022-07-04 (×3): qty 3, 28d supply, fill #0

## 2022-05-13 MED ORDER — DAPAGLIFLOZIN PROPANEDIOL 10 MG PO TABS
10.0000 mg | ORAL_TABLET | Freq: Every day | ORAL | 1 refills | Status: DC
  Filled 2022-05-13 – 2022-06-09 (×3): qty 90, 90d supply, fill #0
  Filled 2022-09-06 – 2022-09-12 (×2): qty 90, 90d supply, fill #1

## 2022-05-13 NOTE — Patient Instructions (Signed)
Decrease your nighttime insulin by 2 units every 3 days for morning blood sugar lower than 85

## 2022-05-13 NOTE — Progress Notes (Signed)
Assessment & Plan:  Shane Bond was seen today for diabetes and hypertension.  Diagnoses and all orders for this visit:  Type 2 diabetes mellitus with hyperglycemia, with long-term current use of insulin (HCC) Increase ozempic to 0.5mg  weekly Continue all medications  -     Cancel: POCT glycosylated hemoglobin (Hb A1C) -     POCT glycosylated hemoglobin (Hb A1C) -     CMP14+EGFR -     Ambulatory referral to Ophthalmology -     Microalbumin / creatinine urine ratio  Thrombocytopenia (HCC) Currently resolved   Hyperlipidemia associated with type 2 diabetes mellitus ([place]) -     Lipid panel INSTRUCTIONS: Work on a low fat, heart healthy diet and participate in regular aerobic exercise program by working out at least 150 minutes per week; 5 days a week-30 minutes per day. Avoid red meat/beef/steak,  fried foods. junk foods, sodas, sugary drinks, unhealthy snacking, alcohol and smoking.  Drink at least 80 oz of water per day and monitor your carbohydrate intake daily.    Malignant neoplasm of prostate ([place]) Follow up with Urology   Need for vaccination with 20-polyvalent pneumococcal conjugate vaccine -     Pneumococcal conjugate vaccine 20-valent    Patient has been counseled on age-appropriate routine health concerns for screening and prevention. These are reviewed and up-to-date. Referrals have been placed accordingly. Immunizations are up-to-date or declined.    Subjective:   Chief Complaint  Patient presents with   Diabetes   Hypertension   HPI Shane Bond 62 y.o. male presents to office today for follow up to HTN and DM  He has a past medical history of AMI, INFERIOR WALL (10/22/2009), Anemia, Bladder atonia, BPH, CAD, NATIVE VESSEL (10/29/2009), Diabetes mellitus type 2, uncontrolled, with complications, Hyperlipidemia LDL goal <70, Hypertension, Kidney stone, Neurogenic bladder as late effect of cerebrovascular accident (CVA), OBESITY (10/22/2009), and Prostate cancer .    DM 2 Diabetes is improving gradually. Monitoring blood glucose levels twice a day. Reports readings as "normal".  Currently taking metformin 1000 mg twice daily Farxiga 10 mg daily, Lantus 30 units BID and ozempic 0.25 mg weekly.  He is on ARB and taking Zetia and omega-3 for hyperlipidemia. LDL not at goal. Lab Results  Component Value Date   HGBA1C 7.2 (A) 05/13/2022   Lab Results  Component Value Date   HGBA1C 7.5 (A) 02/10/2022   Lab Results  Component Value Date   LDLCALC 80 04/26/2021   UTI symptoms Notes odor and burning with urination over the past several day.    HTN Blood pressure is well controlled with losartan 25 mg daily BP Readings from Last 3 Encounters:  05/13/22 111/65  04/18/22 106/67  03/17/22 115/68     Review of Systems  Constitutional:  Negative for fever, malaise/fatigue and weight loss.  HENT: Negative.  Negative for nosebleeds.   Eyes: Negative.  Negative for blurred vision, double vision and photophobia.  Respiratory: Negative.  Negative for cough and shortness of breath.   Cardiovascular: Negative.  Negative for chest pain, palpitations and leg swelling.  Gastrointestinal: Negative.  Negative for heartburn, nausea and vomiting.  Genitourinary:  Positive for dysuria. Negative for flank pain, frequency, hematuria and urgency.       SEE HPI  Musculoskeletal: Negative.  Negative for myalgias.  Neurological: Negative.  Negative for dizziness, focal weakness, seizures and headaches.  Psychiatric/Behavioral: Negative.  Negative for suicidal ideas.     Past Medical History:  Diagnosis Date   AMI, INFERIOR WALL  10/22/2009   Anemia    Bladder atonia    BPH (benign prostatic hyperplasia)    CAD, NATIVE VESSEL 10/29/2009   Diabetes mellitus type 2, uncontrolled, with complications    Hyperlipidemia LDL goal <70    Hypertension    Kidney stone    Neurogenic bladder as late effect of cerebrovascular accident (CVA)    OBESITY 10/22/2009   Prostate  cancer [facility])     Past Surgical History:  Procedure Laterality Date   BACK SURGERY     CORONARY STENT PLACEMENT     CYSTOSCOPY W/ URETERAL STENT PLACEMENT  02/26/2012   Procedure: CYSTOSCOPY WITH RETROGRADE PYELOGRAM/URETERAL STENT PLACEMENT;  Surgeon: Bernestine Amass, MD;  Location: [facility];  Service: Urology;  Laterality: Left;   CYSTOSCOPY WITH RETROGRADE PYELOGRAM, URETEROSCOPY AND STENT PLACEMENT Left 08/07/2019   Procedure: CYSTOSCOPY WITH LEFT RETROGRADE PYELOGRAM, URETEROSCOPY HOLMIUM LASER AND STENT PLACEMENT;  Surgeon: Ardis Hughs, MD;  Location: [facility];  Service: Urology;  Laterality: Left;   CYSTOSCOPY WITH STENT PLACEMENT Left 06/24/2019   Procedure: CYSTOSCOPY WITH STENT PLACEMENT LEFT URETER WITH LEFT RETROGRADE URETERAL;  Surgeon: Ceasar Mons, MD;  Location: [facility];  Service: Urology;  Laterality: Left;   testicles Right     Family History  Problem Relation Age of Onset   Diabetes Father    Stomach cancer Maternal Uncle    Diabetes Paternal Aunt    Coronary artery disease Other    Heart attack Other    Breast cancer Neg Hx    Colon cancer Neg Hx    Pancreatic cancer Neg Hx    Prostate cancer Neg Hx    Esophageal cancer Neg Hx    Liver disease Neg Hx     Social History Reviewed with no changes to be made today.   Outpatient Medications Prior to Visit  Medication Sig Dispense Refill   Accu-Chek Softclix Lancets lancets Use as instructed to check blood glucose level by fingerstick twice per day. 100 each 12   Aspirin 81 MG CAPS Take 1 tablet by mouth daily.     Blood Glucose Monitoring Suppl (ACCU-CHEK GUIDE) w/Device KIT Use as instructed to check blood glucose level by fingerstick twice per day 1 kit 0   glucose blood (ACCU-CHEK GUIDE) test strip Use as instructed to check blood glucose by fingerstick twice per day. 100 each 2   Insulin Pen Needle (TECHLITE PEN NEEDLES) 32G X 4 MM MISC Inject as directed 2 (two) times daily. 100 each 3   losartan  (COZAAR) 25 MG tablet Take 1 tablet (25 mg total) by mouth daily. 90 tablet 1   Misc. Devices (CAREX COCCYX CUSHION) MISC Use daily when sitting for pressure relief ICD10 L98.429 1 each prn   Misc. Devices MISC Please provide patient with standard sized 33F urinary catheters for self cathing along with lubricant. 50 each prn   omega-3 acid ethyl esters (LOVAZA) 1 g capsule Take 2 capsules (2 g total) by mouth 2 (two) times daily. Please fill as a 90 day supply 360 capsule 3   dapagliflozin propanediol (FARXIGA) 10 MG TABS tablet Take 1 tablet (10 mg total) by mouth daily before breakfast. Patient needs PCP appointment for disease state management. 30 tablet 3   insulin glargine (LANTUS) 100 UNIT/ML Solostar Pen Inject 30 Units into the skin 2 (two) times daily. 60 mL 1   Semaglutide,0.25 or 0.5MG /DOS, (OZEMPIC, 0.25 OR 0.5 MG/DOSE,) 2 MG/3ML SOPN Inject 0.25 mg into the skin once a week.  For 4 weeks. Then, increase to 0.5 mg once weekly thereafter. 3 mL 1   fexofenadine (ALLEGRA ALLERGY) 180 MG tablet Take 1 tablet (180 mg total) by mouth daily. (Patient not taking: Reported on 02/10/2022) 30 tablet 0   No facility-administered medications prior to visit.    Allergies  Allergen Reactions   Lipitor [Atorvastatin] Rash   Lisinopril Rash       Objective:    BP 111/65   Pulse 92   Ht 6\' 1"  (1.854 m)   Wt 225 lb 6.4 oz (102.2 kg)   SpO2 95%   BMI 29.74 kg/m  Wt Readings from Last 3 Encounters:  05/13/22 225 lb 6.4 oz (102.2 kg)  02/10/22 247 lb 9.6 oz (112.3 kg)  07/26/21 238 lb 9.6 oz (108.2 kg)    Physical Exam Vitals and nursing note reviewed.  Constitutional:      Appearance: He is well-developed.  HENT:     Head: Normocephalic and atraumatic.  Cardiovascular:     Rate and Rhythm: Normal rate and regular rhythm.     Heart sounds: Normal heart sounds. No murmur heard.    No friction rub. No gallop.  Pulmonary:     Effort: Pulmonary effort is normal. No tachypnea or  respiratory distress.     Breath sounds: Normal breath sounds. No decreased breath sounds, wheezing, rhonchi or rales.  Chest:     Chest wall: No tenderness.  Abdominal:     General: Bowel sounds are normal.     Palpations: Abdomen is soft.  Musculoskeletal:        General: Normal range of motion.     Cervical back: Normal range of motion.  Skin:    General: Skin is warm and dry.  Neurological:     Mental Status: He is alert and oriented to person, place, and time.     Coordination: Coordination normal.  Psychiatric:        Behavior: Behavior normal. Behavior is cooperative.        Thought Content: Thought content normal.        Judgment: Judgment normal.          Patient has been counseled extensively about nutrition and exercise as well as the importance of adherence with medications and regular follow-up. The patient was given clear instructions to go to ER or return to medical center if symptoms don't improve, worsen or new problems develop. The patient verbalized understanding.   Follow-up: Return in about 3 months (around 08/13/2022).   Gildardo Pounds, FNP-BC [facility] [place], [place]   05/13/2022, 2:03 PM

## 2022-05-13 NOTE — Progress Notes (Signed)
Odor and burning while urinating. Referral to endo.

## 2022-05-14 ENCOUNTER — Other Ambulatory Visit: Payer: Self-pay | Admitting: Nurse Practitioner

## 2022-05-14 DIAGNOSIS — N390 Urinary tract infection, site not specified: Secondary | ICD-10-CM

## 2022-05-14 MED ORDER — CIPROFLOXACIN HCL 500 MG PO TABS: 500.0000 mg | ORAL_TABLET | Freq: Two times a day (BID) | ORAL | 0 refills | Status: AC

## 2022-05-14 MED ORDER — EZETIMIBE 10 MG PO TABS: 10.0000 mg | ORAL_TABLET | Freq: Every day | ORAL | 3 refills | Status: DC

## 2022-05-15 LAB — CMP14+EGFR
ALT: 70 IU/L — ABNORMAL HIGH (ref 0–44)
AST: 68 IU/L — ABNORMAL HIGH (ref 0–40)
Albumin/Globulin Ratio: 1.1 — ABNORMAL LOW (ref 1.2–2.2)
Albumin: 4 g/dL (ref 3.9–4.9)
Alkaline Phosphatase: 117 IU/L (ref 44–121)
BUN/Creatinine Ratio: 19 (ref 10–24)
BUN: 17 mg/dL (ref 8–27)
Bilirubin Total: 0.2 mg/dL (ref 0.0–1.2)
CO2: 22 mmol/L (ref 20–29)
Calcium: 9.7 mg/dL (ref 8.6–10.2)
Chloride: 104 mmol/L (ref 96–106)
Creatinine, Ser: 0.91 mg/dL (ref 0.76–1.27)
Globulin, Total: 3.5 g/dL (ref 1.5–4.5)
Glucose: 152 mg/dL — ABNORMAL HIGH (ref 70–99)
Potassium: 4.4 mmol/L (ref 3.5–5.2)
Sodium: 142 mmol/L (ref 134–144)
Total Protein: 7.5 g/dL (ref 6.0–8.5)
eGFR: 96 mL/min/{1.73_m2} (ref >59)

## 2022-05-15 LAB — URINALYSIS, COMPLETE
Bilirubin, UA: NEGATIVE
Ketones, UA: NEGATIVE
Nitrite, UA: POSITIVE — AB
Protein,UA: NEGATIVE
RBC, UA: NEGATIVE
Specific Gravity, UA: >=1.03 — AB (ref 1.005–1.030)
Urobilinogen, Ur: 0.2 mg/dL (ref 0.2–1.0)
pH, UA: 6 (ref 5.0–7.5)

## 2022-05-15 LAB — LIPID PANEL
Chol/HDL Ratio: 5.2 ratio — ABNORMAL HIGH (ref 0.0–5.0)
Cholesterol, Total: 186 mg/dL (ref 100–199)
HDL: 36 mg/dL — ABNORMAL LOW (ref >39)
LDL Chol Calc (NIH): 109 mg/dL — ABNORMAL HIGH (ref 0–99)
Triglycerides: 238 mg/dL — ABNORMAL HIGH (ref 0–149)
VLDL Cholesterol Cal: 41 mg/dL — ABNORMAL HIGH (ref 5–40)

## 2022-05-15 LAB — MICROSCOPIC EXAMINATION
Casts: NONE SEEN /lpf
Epithelial Cells (non renal): NONE SEEN /hpf (ref 0–10)
RBC, Urine: NONE SEEN /hpf (ref 0–2)
WBC, UA: >30 /hpf — AB (ref 0–5)

## 2022-05-15 LAB — MICROALBUMIN / CREATININE URINE RATIO
Creatinine, Urine: 66.1 mg/dL
Microalb/Creat Ratio: 19 mg/g creat (ref 0–29)
Microalbumin, Urine: 12.5 ug/mL

## 2022-05-18 ENCOUNTER — Other Ambulatory Visit: Payer: Self-pay

## 2022-05-18 LAB — URINE CYTOLOGY ANCILLARY ONLY
Bacterial Vaginitis-Urine: NEGATIVE
Candida Urine: NEGATIVE
Chlamydia: NEGATIVE
Comment: NEGATIVE
Comment: NEGATIVE
Comment: NORMAL
Neisseria Gonorrhea: NEGATIVE
Trichomonas: NEGATIVE

## 2022-05-19 LAB — URINE CULTURE

## 2022-05-24 ENCOUNTER — Other Ambulatory Visit: Payer: Self-pay

## 2022-05-30 ENCOUNTER — Other Ambulatory Visit: Payer: Self-pay

## 2022-05-30 ENCOUNTER — Other Ambulatory Visit (HOSPITAL_COMMUNITY): Payer: Self-pay

## 2022-06-09 ENCOUNTER — Other Ambulatory Visit: Payer: Self-pay

## 2022-06-09 ENCOUNTER — Other Ambulatory Visit (HOSPITAL_COMMUNITY): Payer: Self-pay

## 2022-06-10 ENCOUNTER — Other Ambulatory Visit (HOSPITAL_COMMUNITY): Payer: Self-pay

## 2022-06-16 ENCOUNTER — Encounter: Payer: Self-pay | Admitting: "Endocrinology

## 2022-06-17 ENCOUNTER — Other Ambulatory Visit: Payer: Self-pay

## 2022-06-23 ENCOUNTER — Ambulatory Visit: Payer: Self-pay | Admitting: *Deleted

## 2022-06-23 NOTE — Telephone Encounter (Signed)
Message from Randol Kern sent at 06/23/2022 10:00 AM EDT  Summary: Possible UTI, no appts   Pt called reporting potential UTI symptoms, no appt available soon enough.  Best contact: [phone]          Call History   Type Contact Phone/Fax User  06/23/2022 10:00 AM EDT Phone ([address] Shane Bond, Shane Bond (Self) [phone] Judie Petit) Randol Kern   Reason for Disposition  Urinating more frequently than usual (i.e., frequency)    Gets many UTIs.   Has had a stroke so uses a catheter.   It burns when he inserts the catheter which it does not normally do.   Urine is cloudy too.  Answer Assessment - Initial Assessment Questions 1. SYMPTOM: "What's the main symptom you're concerned about?" (e.g., frequency, incontinence)     Burning with urination.     My prostate swells up  2. ONSET: "When did the  burning  start?"     2-3 days ago 3. PAIN: "Is there any pain?" If Yes, ask: "How bad is it?" (Scale: 1-10; mild, moderate, severe)     Yes burning, cloudy.    I've had them before so I'm sure I've got a UTI 4. CAUSE: "What do you think is causing the symptoms?"     UTI 5. OTHER SYMPTOMS: "Do you have any other symptoms?" (e.g., blood in urine, fever, flank pain, pain with urination)     Just the burning,   Prostate is swollen when I have a UTI.     I use a catheter.   It burns when I do the catheter. 6. PREGNANCY: "Is there any chance you are pregnant?" "When was your last menstrual period?"     N/A  Protocols used: Urinary Symptoms-A-AH

## 2022-06-23 NOTE — Telephone Encounter (Signed)
  Chief Complaint: Due to a stroke he does in and out catherization to drain his bladder.  Gets frequent UTIs.   "I know this is another UTI because I get them all the time and I know what to look for".   Symptoms: It is burning when he inserts the catheter and his urine is cloudy.   Frequency: Uses a catheter to in and out cath himself. Pertinent Negatives: Patient denies blood in urine, flank pain or abd pain. Disposition: [] ED /[] Urgent Care (no appt availability in office) / [] Appointment(In office/virtual)/ []  [facility]/ [] Home Care/ [] Refused Recommended Disposition /[]  Mobile Bus/ [x]  Follow-up with PCP Additional Notes: He does not have transportation and there are no appts with [facility].  He is asking if provider would be willing to call in the antibiotic.   They always call in the same one every time. I've sent a message to Bertram Denver, NP to see if she will call in an rx for him.    Pt. Agreeable to being called back.

## 2022-06-24 ENCOUNTER — Ambulatory Visit
Admission: EM | Admit: 2022-06-24 | Discharge: 2022-06-24 | Disposition: A | Discharge status: Home / Self Care | Payer: Medicaid Other | Attending: Internal Medicine | Admitting: Internal Medicine

## 2022-06-24 DIAGNOSIS — R350 Frequency of micturition: Secondary | ICD-10-CM | POA: Diagnosis present

## 2022-06-24 DIAGNOSIS — N3000 Acute cystitis without hematuria: Secondary | ICD-10-CM | POA: Diagnosis present

## 2022-06-24 DIAGNOSIS — R300 Dysuria: Secondary | ICD-10-CM | POA: Diagnosis present

## 2022-06-24 LAB — POCT URINALYSIS DIP (MANUAL ENTRY)
Bilirubin, UA: NEGATIVE
Blood, UA: NEGATIVE
Glucose, UA: 500 mg/dL — AB
Ketones, POC UA: NEGATIVE mg/dL
Nitrite, UA: POSITIVE — AB
Protein Ur, POC: NEGATIVE mg/dL
Spec Grav, UA: 1.02 (ref 1.010–1.025)
Urobilinogen, UA: 0.2 E.U./dL
pH, UA: 6 (ref 5.0–8.0)

## 2022-06-24 LAB — POCT FASTING CBG KUC MANUAL ENTRY: POCT Glucose (KUC): 144 mg/dL — AB (ref 70–99)

## 2022-06-24 MED ORDER — CIPROFLOXACIN HCL 500 MG PO TABS: 500.0000 mg | ORAL_TABLET | Freq: Two times a day (BID) | ORAL | 0 refills | Status: AC

## 2022-06-24 NOTE — ED Triage Notes (Addendum)
Patient c/o dysuria, cloudy urine, and frequency x 2-3 days.  Patient states he self caths himself and the last time was at 0600 today

## 2022-06-24 NOTE — ED Provider Notes (Signed)
Shane Bond    CSN: [id number] Arrival date & time: 06/24/22  4098      History   Chief Complaint Chief Complaint  Patient presents with   Dysuria   Urinary Frequency    HPI Shane Bond is a 62 y.o. male.   Patient presents with urinary burning, cloudy urine, urinary frequency that started about 2 to 3 days ago.  Patient self caths himself given history of back injury.  He states that he has been having frequent urinary tract infections given that he does not get up appropriately at night to self cath.  He is followed by urology but is not sure if they are aware of his frequent urinary tract infections.  Reports he is followed by urology for his prostate. Last UTI treatment was in March.  Was treated with Cipro with resolution of symptoms. Patient denies any fever.    Dysuria Urinary Frequency    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   Anemia    Bladder atonia    BPH (benign prostatic hyperplasia)    CAD, NATIVE VESSEL 10/29/2009   Diabetes mellitus type 2, uncontrolled, with complications    Hyperlipidemia LDL goal <70    Hypertension    Kidney stone    Neurogenic bladder as late effect of cerebrovascular accident (CVA)    OBESITY 10/22/2009   Prostate cancer [facility])     Patient Active Problem List   Diagnosis Date Noted   Malignant neoplasm of prostate (HCC) 09/03/2019   Hydronephrosis with renal and ureteral calculus obstruction 06/24/2019   Thrombocytopenia (HCC) 06/21/2019   Normocytic anemia 06/21/2019   Bacteremia due to Klebsiella pneumoniae 06/21/2019   Lung infiltrate 06/20/2019   Sepsis due to urinary tract infection (HCC) 06/19/2019   Hyperkalemia 06/19/2019   Acute kidney injury (HCC) 06/19/2019   Type 2 diabetes mellitus with hyperglycemia, with long-term current use of insulin (HCC) 04/13/2017   Diabetes mellitus without complication (HCC) 12/01/2016   Former smoker 11/02/2015   Diabetes type 2,  uncontrolled 06/18/2015   Hyperlipidemia associated with type 2 diabetes mellitus (HCC) 06/18/2015   BPH (benign prostatic hyperplasia) 06/18/2015   Self-catheterizes urinary bladder 06/18/2015   Hypertension associated with diabetes (HCC) 07/08/2010   CAD, NATIVE VESSEL 10/29/2009   CAD (coronary artery disease), native coronary artery 10/29/2009   AMI, INFERIOR WALL 10/22/2009    Past Surgical History:  Procedure Laterality Date   BACK SURGERY     CORONARY STENT PLACEMENT     CYSTOSCOPY W/ URETERAL STENT PLACEMENT  02/26/2012   Procedure: CYSTOSCOPY WITH RETROGRADE PYELOGRAM/URETERAL STENT PLACEMENT;  Surgeon: Valetta Fuller, MD;  Location: [facility];  Service: Urology;  Laterality: Left;   CYSTOSCOPY WITH RETROGRADE PYELOGRAM, URETEROSCOPY AND STENT PLACEMENT Left 08/07/2019   Procedure: CYSTOSCOPY WITH LEFT RETROGRADE PYELOGRAM, URETEROSCOPY HOLMIUM LASER AND STENT PLACEMENT;  Surgeon: [name], MD;  Location: [facility];  Service: Urology;  Laterality: Left;   CYSTOSCOPY WITH STENT PLACEMENT Left 06/24/2019   Procedure: CYSTOSCOPY WITH STENT PLACEMENT LEFT URETER WITH LEFT RETROGRADE URETERAL;  Surgeon: Rene Paci, MD;  Location: [facility];  Service: Urology;  Laterality: Left;   testicles Right        Home Medications    Prior to Admission medications   Medication Sig Start Date End Date Taking? Authorizing Provider  ciprofloxacin (CIPRO) 500 MG tablet Take 1 tablet (500 mg total) by mouth every 12 (twelve) hours for 7 days. 06/24/22 07/01/22 Yes  [place], [place], [place]  Accu-Chek Softclix Lancets lancets Use as instructed to check blood glucose level by fingerstick twice per day. 04/26/21   [name], NP  Aspirin 81 MG CAPS Take 1 tablet by mouth daily.    [provider]  Blood Glucose Monitoring Suppl (ACCU-CHEK GUIDE) w/Device KIT Use as instructed to check blood glucose level by fingerstick twice per day 04/26/21   [name], NP  dapagliflozin  propanediol (FARXIGA) 10 MG TABS tablet Take 1 tablet (10 mg total) by mouth daily before breakfast. 05/13/22   [name], NP  ezetimibe (ZETIA) 10 MG tablet Take 1 tablet (10 mg total) by mouth daily. 05/14/22   [name], NP  glucose blood (ACCU-CHEK GUIDE) test strip Use as instructed to check blood glucose by fingerstick twice per day. 05/09/22   Hoy Register, MD  insulin glargine (LANTUS) 100 UNIT/ML Solostar Pen Inject 30 Units into the skin 2 (two) times daily. 05/13/22   [name], NP  Insulin Pen Needle (TECHLITE PEN NEEDLES) 32G X 4 MM MISC Inject as directed 2 (two) times daily. 02/10/22   Anders Simmonds, PA-C  losartan (COZAAR) 25 MG tablet Take 1 tablet (25 mg total) by mouth daily. 03/17/22   Hoy Register, MD  Misc. Devices (CAREX COCCYX CUSHION) MISC Use daily when sitting for pressure relief ICD10 L98.429 07/02/20   [name], NP  Misc. Devices MISC Please provide patient with standard sized 45F urinary catheters for self cathing along with lubricant. 05/29/18   [name], NP  omega-3 acid ethyl esters (LOVAZA) 1 g capsule Take 2 capsules (2 g total) by mouth 2 (two) times daily. Please fill as a 90 day supply 02/10/22 05/13/22  Anders Simmonds, PA-C  Semaglutide,0.25 or 0.5MG /DOS, (OZEMPIC, 0.25 OR 0.5 MG/DOSE,) 2 MG/3ML SOPN Inject 0.5 mg into the skin once a week. 05/13/22   [name], NP  gemfibrozil (LOPID) 600 MG tablet Take 1 tablet (600 mg total) by mouth 2 (two) times daily before a meal. 07/20/19 11/04/19  [name], NP    Family History Family History  Problem Relation Age of Onset   Diabetes Father    Stomach cancer Maternal Uncle    Diabetes Paternal Aunt    Coronary artery disease Other    Heart attack Other    Breast cancer Neg Hx    Colon cancer Neg Hx    Pancreatic cancer Neg Hx    Prostate cancer Neg Hx    Esophageal cancer Neg Hx    Liver disease Neg Hx     Social History Social History   Tobacco Use    Smoking status: Every Day    Packs/day: .5    Types: E-cigarettes, Cigarettes   Smokeless tobacco: Former  Building services engineer Use: Former  Substance Use Topics   Alcohol use: Not Currently    Comment: occ   Drug use: No     Allergies   Lipitor [atorvastatin] and Lisinopril   Review of Systems Review of Systems Per HPI  Physical Exam Triage Vital Signs ED Triage Vitals [06/24/22 0838]  Enc Vitals Group     BP 113/77     Pulse Rate 79     Resp 18     Temp 97.8 F (36.6 C)     Temp Source Oral     SpO2 100 %     Weight      Height  Head Circumference      Peak Flow      Pain Score 8     Pain Loc      Pain Edu?      Excl. in GC?    No data found.  Updated Vital Signs BP 113/77 (BP Location: Left Arm)   Pulse 79   Temp 97.8 F (36.6 C) (Oral)   Resp 18   SpO2 100%   Visual Acuity Right Eye Distance:   Left Eye Distance:   Bilateral Distance:    Right Eye Near:   Left Eye Near:    Bilateral Near:     Physical Exam Constitutional:      General: He is not in acute distress.    Appearance: Normal appearance. He is not toxic-appearing or diaphoretic.  HENT:     Head: Normocephalic and atraumatic.  Eyes:     Extraocular Movements: Extraocular movements intact.     Conjunctiva/sclera: Conjunctivae normal.  Pulmonary:     Effort: Pulmonary effort is normal.  Neurological:     General: No focal deficit present.     Mental Status: He is alert and oriented to person, place, and time. Mental status is at baseline.  Psychiatric:        Mood and Affect: Mood normal.        Behavior: Behavior normal.        Thought Content: Thought content normal.        Judgment: Judgment normal.      UC Treatments / Results  Labs (all labs ordered are listed, but only abnormal results are displayed) Labs Reviewed  POCT URINALYSIS DIP (MANUAL ENTRY) - Abnormal; Notable for the following components:      Result Value   Glucose, UA =500 (*)    Nitrite, UA  Positive (*)    Leukocytes, UA Trace (*)    All other components within normal limits  POCT FASTING CBG [facility] MANUAL ENTRY - Abnormal; Notable for the following components:   POCT Glucose ([facility]) 144 (*)    All other components within normal limits  URINE CULTURE    EKG   Radiology No results found.  Procedures Procedures (including critical Bond time)  Medications Ordered in UC Medications - No data to display  Initial Impression / Assessment and Plan / UC Course  I have reviewed the triage vital signs and the nursing notes.  Pertinent labs & imaging results that were available during my Bond of the patient were reviewed by me and considered in my medical decision making (see chart for details).     UA showing trace leukocytes but with associated symptoms and patient's frequent urinary tract infections, will opt to treat with antibiotic.  After further review of patient's urine cultures, it appears that the best medication for patient is Cipro as it is most sensitive to the bacteria that is typically causing patient's urinary tract infections.  Patient has taken Cipro before and tolerated well so this should be safe.  Advised to stop taking immediately if he has any muscle pains.  Will send urine culture to confirm.  Patient advised to follow-up with urology given frequent urinary tract infections as well.  Glucose was completed given glucose found on urine.  This is most likely due to his diabetes medications as glucose is typical on his UAs.  Glucose was 144 which is unremarkable.  Advised strict return precautions.  Patient verbalized understanding and was agreeable with plan. Final Clinical Impressions(s) / UC Diagnoses  Final diagnoses:  Acute cystitis without hematuria  Dysuria  Urinary frequency     Discharge Instructions      I am treating you for urinary infection with an antibiotic.  If you start having any muscle pains, please stop taking this medication immediately.   Urine culture is pending.  Will call when it results.  Follow-up with urology.    ED Prescriptions     Medication Sig Dispense Auth. Provider   ciprofloxacin (CIPRO) 500 MG tablet Take 1 tablet (500 mg total) by mouth every 12 (twelve) hours for 7 days. 14 tablet Johnson, Acie Fredrickson, [place]      PDMP not reviewed this encounter.   Gustavus Bryant, [place] 06/24/22 [phone]

## 2022-06-24 NOTE — Discharge Instructions (Addendum)
I am treating you for urinary infection with an antibiotic.  If you start having any muscle pains, please stop taking this medication immediately.  Urine culture is pending.  Will call when it results.  Follow-up with urology.

## 2022-06-25 LAB — URINE CULTURE

## 2022-07-04 ENCOUNTER — Other Ambulatory Visit: Payer: Self-pay

## 2022-07-04 ENCOUNTER — Other Ambulatory Visit (HOSPITAL_COMMUNITY): Payer: Self-pay

## 2022-07-15 ENCOUNTER — Other Ambulatory Visit: Payer: Self-pay

## 2022-07-15 MED ORDER — AMOXICILLIN-POT CLAVULANATE 875-125 MG PO TABS
1.0000 | ORAL_TABLET | Freq: Two times a day (BID) | ORAL | 0 refills | Status: DC
  Filled 2022-07-15 (×2): qty 20, 10d supply, fill #0

## 2022-07-27 ENCOUNTER — Encounter: Payer: Self-pay | Admitting: "Endocrinology

## 2022-07-27 ENCOUNTER — Other Ambulatory Visit: Payer: Self-pay

## 2022-07-27 ENCOUNTER — Other Ambulatory Visit: Payer: Self-pay | Admitting: Nurse Practitioner

## 2022-07-27 ENCOUNTER — Other Ambulatory Visit (HOSPITAL_COMMUNITY): Payer: Self-pay

## 2022-07-27 ENCOUNTER — Ambulatory Visit (INDEPENDENT_AMBULATORY_CARE_PROVIDER_SITE_OTHER): Payer: Medicaid Other | Admitting: "Endocrinology

## 2022-07-27 VITALS — BP 124/66 | HR 88 | Ht 73.0 in | Wt 244.6 lb

## 2022-07-27 DIAGNOSIS — F17200 Nicotine dependence, unspecified, uncomplicated: Secondary | ICD-10-CM

## 2022-07-27 DIAGNOSIS — E1159 Type 2 diabetes mellitus with other circulatory complications: Secondary | ICD-10-CM | POA: Diagnosis not present

## 2022-07-27 DIAGNOSIS — I10 Essential (primary) hypertension: Secondary | ICD-10-CM | POA: Diagnosis not present

## 2022-07-27 DIAGNOSIS — E782 Mixed hyperlipidemia: Secondary | ICD-10-CM | POA: Diagnosis not present

## 2022-07-27 DIAGNOSIS — Z794 Long term (current) use of insulin: Secondary | ICD-10-CM | POA: Insufficient documentation

## 2022-07-27 DIAGNOSIS — E1165 Type 2 diabetes mellitus with hyperglycemia: Secondary | ICD-10-CM

## 2022-07-27 MED ORDER — INSULIN GLARGINE 100 UNIT/ML SOLOSTAR PEN: 40.0000 [IU] | PEN_INJECTOR | Freq: Every day | SUBCUTANEOUS | 1 refills | Status: DC

## 2022-07-27 MED ORDER — SEMAGLUTIDE (1 MG/DOSE) 4 MG/3ML ~~LOC~~ SOPN
1.0000 mg | PEN_INJECTOR | SUBCUTANEOUS | 1 refills | Status: DC
  Filled 2022-07-27 (×2): qty 3, 28d supply, fill #0

## 2022-07-27 MED ORDER — INSULIN GLARGINE 100 UNIT/ML SOLOSTAR PEN
40.0000 [IU] | PEN_INJECTOR | Freq: Every day | SUBCUTANEOUS | 1 refills | Status: DC
Start: 2022-07-27 — End: 2022-09-20
  Filled 2022-07-27: qty 45, 112d supply, fill #0
  Filled 2022-08-05 – 2022-08-11 (×2): qty 36, 90d supply, fill #0
  Filled 2022-09-20: qty 36, 90d supply, fill #1

## 2022-07-27 NOTE — Progress Notes (Signed)
Endocrinology Consult Note       07/27/2022, 11:32 AM   Subjective:    Patient ID: Shane Bond, male    DOB: 11/29/1960.  Shane Bond is being seen in consultation for management of currently uncontrolled symptomatic diabetes requested by  [name], NP.   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   Anemia    Bladder atonia    BPH (benign prostatic hyperplasia)    CAD, NATIVE VESSEL 10/29/2009   Diabetes mellitus type 2, uncontrolled, with complications    Hyperlipidemia LDL goal <70    Hypertension    Kidney stone    Neurogenic bladder as late effect of cerebrovascular accident (CVA)    OBESITY 10/22/2009   Prostate cancer [facility])     Past Surgical History:  Procedure Laterality Date   BACK SURGERY     CORONARY STENT PLACEMENT     CYSTOSCOPY W/ URETERAL STENT PLACEMENT  02/26/2012   Procedure: CYSTOSCOPY WITH RETROGRADE PYELOGRAM/URETERAL STENT PLACEMENT;  Surgeon: Valetta Fuller, MD;  Location: [facility];  Service: Urology;  Laterality: Left;   CYSTOSCOPY WITH RETROGRADE PYELOGRAM, URETEROSCOPY AND STENT PLACEMENT Left 08/07/2019   Procedure: CYSTOSCOPY WITH LEFT RETROGRADE PYELOGRAM, URETEROSCOPY HOLMIUM LASER AND STENT PLACEMENT;  Surgeon: [name], MD;  Location: [facility];  Service: Urology;  Laterality: Left;   CYSTOSCOPY WITH STENT PLACEMENT Left 06/24/2019   Procedure: CYSTOSCOPY WITH STENT PLACEMENT LEFT URETER WITH LEFT RETROGRADE URETERAL;  Surgeon: Rene Paci, MD;  Location: [facility];  Service: Urology;  Laterality: Left;   testicles Right     Social History   Socioeconomic History   Marital status: Single    Spouse name: Not on file   Number of children: Not on file   Years of education: Not on file   Highest education level: Not on file  Occupational History    Comment: 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  Tobacco Use    Smoking status: Every Day    Packs/day: .5    Types: E-cigarettes, Cigarettes   Smokeless tobacco: Former  Building services engineer Use: Former  Substance and Sexual Activity   Alcohol use: Not Currently    Comment: occ   Drug use: No   Sexual activity: Not Currently  Other Topics Concern   Not on file  Social History Narrative   Not on file   Social Determinants of Health   Financial Resource Strain: Not on file  Food Insecurity: Not on file  Transportation Needs: Not on file  Physical Activity: Not on file  Stress: Not on file  Social Connections: Not on file    Family History  Problem Relation Age of Onset   Hypertension Mother    Diabetes Father    Stomach cancer Maternal Uncle    Diabetes Paternal Aunt    Coronary artery disease Other    Heart attack Other    Breast cancer Neg Hx    Colon cancer Neg Hx    Pancreatic cancer Neg Hx    Prostate cancer Neg Hx    Esophageal cancer Neg  Hx    Liver disease Neg Hx     Outpatient Encounter Medications as of 07/27/2022  Medication Sig   Multiple Vitamin (MULTIVITAMIN ADULT PO) Take 1 tablet by mouth daily.   Semaglutide, 1 MG/DOSE, 4 MG/3ML SOPN Inject 1 mg as directed once a week.   Accu-Chek Softclix Lancets lancets Use as instructed to check blood glucose level by fingerstick twice per day.   amoxicillin-clavulanate (AUGMENTIN) 875-125 MG tablet Take 1 tablet by mouth every 12 (twelve) hours. (Patient not taking: Reported on 07/27/2022)   Aspirin 81 MG CAPS Take 1 tablet by mouth daily.   Blood Glucose Monitoring Suppl (ACCU-CHEK GUIDE) w/Device KIT Use as instructed to check blood glucose level by fingerstick twice per day   dapagliflozin propanediol (FARXIGA) 10 MG TABS tablet Take 1 tablet (10 mg total) by mouth daily before breakfast.   ezetimibe (ZETIA) 10 MG tablet Take 1 tablet (10 mg total) by mouth daily.   glucose blood (ACCU-CHEK GUIDE) test strip Use as instructed to check blood glucose by fingerstick twice per  day.   insulin glargine (LANTUS) 100 UNIT/ML Solostar Pen Inject 40 Units into the skin at bedtime.   Insulin Pen Needle (TECHLITE PEN NEEDLES) 32G X 4 MM MISC Inject as directed 2 (two) times daily.   losartan (COZAAR) 25 MG tablet Take 1 tablet (25 mg total) by mouth daily.   Misc. Devices (CAREX COCCYX CUSHION) MISC Use daily when sitting for pressure relief ICD10 L98.429   Misc. Devices MISC Please provide patient with standard sized 27F urinary catheters for self cathing along with lubricant.   omega-3 acid ethyl esters (LOVAZA) 1 g capsule Take 2 capsules (2 g total) by mouth 2 (two) times daily. Please fill as a 90 day supply   [DISCONTINUED] gemfibrozil (LOPID) 600 MG tablet Take 1 tablet (600 mg total) by mouth 2 (two) times daily before a meal.   [DISCONTINUED] insulin glargine (LANTUS) 100 UNIT/ML Solostar Pen Inject 30 Units into the skin 2 (two) times daily. (Patient taking differently: Inject 32-33 Units into the skin 2 (two) times daily. Inject 32 units each morning and 33 units each evening at bedtime)   [DISCONTINUED] Semaglutide,0.25 or 0.5MG /DOS, (OZEMPIC, 0.25 OR 0.5 MG/DOSE,) 2 MG/3ML SOPN Inject 0.5 mg into the skin once a week.   No facility-administered encounter medications on file as of 07/27/2022.    ALLERGIES: Allergies  Allergen Reactions   Lipitor [Atorvastatin] Rash   Lisinopril Rash    VACCINATION STATUS: Immunization History  Administered Date(s) Administered   Influenza Whole 01/25/2018, 02/18/2019   Influenza,inj,Quad PF,6+ Mos 10/08/2015, 12/01/2016, 11/27/2020   Pneumococcal Polysaccharide-23 02/27/2012   Tdap 12/01/2016    Diabetes He presents for his initial diabetic visit. He has type 2 diabetes mellitus. Onset time: He was diagnosed at approximate age of 63 years. There are no hypoglycemic associated symptoms. Pertinent negatives for hypoglycemia include no confusion, headaches, pallor or seizures. Associated symptoms include polydipsia and  polyuria. Pertinent negatives for diabetes include no chest pain, no fatigue, no polyphagia and no weakness. There are no hypoglycemic complications. Symptoms are stable. Diabetic complications include a CVA and heart disease. (Nonalcoholic fatty liver disease, comorbid hyperlipidemia and hypertension.  Patient is a smoker, sedentary. Patient with bladder atonia as a complication of cerebrovascular accident) Risk factors for coronary artery disease include diabetes mellitus, dyslipidemia, hypertension, male sex, obesity, sedentary lifestyle, tobacco exposure and family history. Current diabetic treatments: He is currently on Lantus 32 units twice daily, Ozempic 0.5 mg subcutaneously weekly,  Farxiga 10 mg p.o. once a day. His weight is increasing steadily. He is following a generally unhealthy diet. When asked about meal planning, he reported none. He has not had a previous visit with a dietitian. He never participates in exercise. His home blood glucose trend is decreasing steadily. His overall blood glucose range is 140-180 mg/dl. (He brought a meter with him showing average blood glucose of 152 for the last 7 days, 144 for the last 30 days.  His recent A1c was 7.2% on May 13, 2022.) An ACE inhibitor/angiotensin II receptor blocker is being taken.  Hyperlipidemia This is a chronic problem. The current episode started more than 1 year ago. Recent lipid tests were reviewed and are high. Exacerbating diseases include diabetes and obesity. Pertinent negatives include no chest pain, myalgias or shortness of breath. Current antihyperlipidemic treatment includes ezetimibe and bile acid sequestrants. Risk factors for coronary artery disease include family history, dyslipidemia, diabetes mellitus, male sex, obesity, hypertension and a sedentary lifestyle.  Hypertension This is a chronic problem. The current episode started more than 1 year ago. Pertinent negatives include no chest pain, headaches, neck pain,  palpitations or shortness of breath. Risk factors for coronary artery disease include dyslipidemia, diabetes mellitus, family history, obesity, male gender, smoking/tobacco exposure and sedentary lifestyle. Past treatments include angiotensin blockers. Hypertensive end-organ damage includes CAD/MI and CVA.     Review of Systems  Constitutional:  Negative for chills, fatigue, fever and unexpected weight change.  HENT:  Negative for dental problem, mouth sores and trouble swallowing.   Eyes:  Negative for visual disturbance.  Respiratory:  Negative for cough, choking, chest tightness, shortness of breath and wheezing.   Cardiovascular:  Negative for chest pain, palpitations and leg swelling.  Gastrointestinal:  Negative for abdominal distention, abdominal pain, constipation, diarrhea, nausea and vomiting.  Endocrine: Positive for polydipsia and polyuria. Negative for polyphagia.  Genitourinary:  Negative for dysuria, flank pain, hematuria and urgency.  Musculoskeletal:  Negative for back pain, gait problem, myalgias and neck pain.  Skin:  Negative for pallor, rash and wound.  Neurological:  Negative for seizures, syncope, weakness, numbness and headaches.  Psychiatric/Behavioral:  Negative for confusion and dysphoric mood.     Objective:       07/27/2022    9:11 AM 06/24/2022    8:38 AM 05/13/2022    1:32 PM  Vitals with BMI  Height 6\' 1"   6\' 1"   Weight 244 lbs 10 oz  225 lbs 6 oz  BMI 32.28  29.74  Systolic 124 113 161  Diastolic 66 77 65  Pulse 88 79 92    BP 124/66   Pulse 88   Ht 6\' 1"  (1.854 m)   Wt 244 lb 9.6 oz (110.9 kg)   BMI 32.27 kg/m   Wt Readings from Last 3 Encounters:  07/27/22 244 lb 9.6 oz (110.9 kg)  05/13/22 225 lb 6.4 oz (102.2 kg)  02/10/22 247 lb 9.6 oz (112.3 kg)     Physical Exam Constitutional:      General: He is not in acute distress.    Appearance: He is well-developed.  HENT:     Head: Normocephalic and atraumatic.  Neck:     Thyroid: No  thyromegaly.     Trachea: No tracheal deviation.  Cardiovascular:     Rate and Rhythm: Normal rate.     Pulses:          Dorsalis pedis pulses are 1+ on the right side and 1+ on the  left side.       Posterior tibial pulses are 1+ on the right side and 1+ on the left side.     Heart sounds: Normal heart sounds, S1 normal and S2 normal. No murmur heard.    No gallop.  Pulmonary:     Effort: No respiratory distress.     Breath sounds: Normal breath sounds. No wheezing.  Abdominal:     General: Bowel sounds are normal. There is no distension.     Palpations: Abdomen is soft.     Tenderness: There is no abdominal tenderness. There is no guarding.  Musculoskeletal:     Right shoulder: No swelling or deformity.     Cervical back: Normal range of motion and neck supple.  Skin:    General: Skin is warm and dry.     Findings: No rash.     Nails: There is no clubbing.  Neurological:     Mental Status: He is alert and oriented to person, place, and time.     Cranial Nerves: No cranial nerve deficit.     Sensory: No sensory deficit.     Gait: Gait normal.     Deep Tendon Reflexes: Reflexes are normal and symmetric.  Psychiatric:        Speech: Speech normal.        Behavior: Behavior normal. Behavior is cooperative.        Thought Content: Thought content normal.        Judgment: Judgment normal.       CMP ( most recent) CMP     Component Value Date/Time   NA 142 05/13/2022 1419   K 4.4 05/13/2022 1419   CL 104 05/13/2022 1419   CO2 22 05/13/2022 1419   GLUCOSE 152 (H) 05/13/2022 1419   GLUCOSE 210 (H) 08/26/2020 0643   BUN 17 05/13/2022 1419   CREATININE 0.91 05/13/2022 1419   CREATININE 0.84 05/28/2015 0950   CALCIUM 9.7 05/13/2022 1419   PROT 7.5 05/13/2022 1419   ALBUMIN 4.0 05/13/2022 1419   AST 68 (H) 05/13/2022 1419   ALT 70 (H) 05/13/2022 1419   ALKPHOS 117 05/13/2022 1419   BILITOT 0.2 05/13/2022 1419   EGFR 96 05/13/2022 1419   GFRNONAA >60 08/26/2020 0643      Diabetic Labs (most recent): Lab Results  Component Value Date   HGBA1C 7.2 (A) 05/13/2022   HGBA1C 7.5 (A) 02/10/2022   HGBA1C 6.6 (A) 04/26/2021     Lipid Panel ( most recent) Lipid Panel     Component Value Date/Time   CHOL 186 05/13/2022 1419   TRIG 238 (H) 05/13/2022 1419   HDL 36 (L) 05/13/2022 1419   CHOLHDL 5.2 (H) 05/13/2022 1419   CHOLHDL 5.1 (H) 06/18/2015 1116   VLDL 32 (H) 06/18/2015 1116   LDLCALC 109 (H) 05/13/2022 1419   LDLDIRECT 87.7 11/09/2009 1026   LABVLDL 41 (H) 05/13/2022 1419       Assessment & Plan:   1. DM type 2 causing vascular disease (HCC)   - Shane Bond has currently uncontrolled symptomatic type 2 DM since  62 years of age,  with most recent A1c of 7.2 %. Recent labs reviewed. - I had a long discussion with him about the possible risk factors and  the pathology behind its diabetes and its complications. -his diabetes is complicated by coronary artery disease, CVA complicated by bladder atonia currently doing intermittent catheterization, obesity/sedentary life, comorbid hypertension, severe hyperlipidemia and he remains at exceedingly high  risk for more acute and chronic complications which include CAD, CVA, CKD, retinopathy, and neuropathy. These are all discussed in detail with him.  - I discussed all available options of managing his diabetes including de-escalation of medications. I have counseled him on Food as Medicine by adopting a Whole Food , Plant Predominant  ( WFPP) nutrition as recommended by [employer] of Lifestyle Medicine. Patient is encouraged to switch to  unprocessed or minimally processed  complex starch, adequate protein intake (mainly plant source), minimal liquid fat, plenty of fruits, and vegetables. -  he is advised to stick to a routine mealtimes to eat 3 complete meals a day and snack only when necessary ( to snack only to correct hypoglycemia BG <70 day time or <100 at night).   - he acknowledges that  there is a room for improvement in his food and drink choices. - Further Specific Suggestion is made for him to avoid simple carbohydrates  from his diet including Cakes, Sweet Desserts, Ice Cream, Soda (diet and regular), Sweet Tea, Candies, Chips, Cookies, Store Bought Juices, Alcohol ,  Artificial Sweeteners,  Coffee Creamer, and "Sugar-free" Products. This will help patient to have more stable blood glucose profile and potentially avoid unintended weight gain.  The following Lifestyle Medicine recommendations according to American College of Lifestyle Medicine [facility]) were discussed and offered to patient and he agrees to start the journey:  A.  Whole Foods, Plant-based plate comprising of fruits and vegetables, plant-based proteins, whole-grain carbohydrates was discussed in detail with the patient.   A list for source of those nutrients were also provided to the patient.  Patient will use only water or unsweetened tea for hydration. B.  The need to stay away from risky substances including alcohol, smoking; obtaining 7 to 9 hours of restorative sleep, at least 150 minutes of moderate intensity exercise weekly, the importance of healthy social connections,  and stress reduction techniques were discussed. C.  A full color page of  Calorie density of various food groups per pound showing examples of each food groups was provided to the patient.  - he will be scheduled with Norm Salt, RDN, CDE for individualized diabetes education.  - I have approached him with the following individualized plan to manage  his diabetes and patient agrees:   -He is advised to lower his Lantus to 40 units nightly, advised to continue monitoring blood glucose twice a day-daily before breakfast and at bedtime.   - he is encouraged to call clinic for blood glucose levels less than 70 or above 300 mg /dl. - he is advised to continue Farxiga 10 mg, therapeutically suitable for patient . -He is tolerating his Ozempic for  the last several weeks, advised to increase to 1 mg subcutaneously weekly.   - Specific targets for  A1c;  LDL, HDL,  and Triglycerides were discussed with the patient.  2) Blood Pressure /Hypertension:  his blood pressure is  controlled to target.   he is advised to continue his current medications including losartan 25 mg p.o. daily with breakfast . 3) Lipids/Hyperlipidemia:   Review of his recent lipid panel showed uncontrolled  LDL at 109 .  Statins are listed as allergy for him.  He is advised to continue ezetimibe and omega-3 fatty acids.  Whole food plant-based diet discussed above will help him control dyslipidemia as well.    4)  Weight/Diet:  Body mass index is 32.27 kg/m.  -   clearly complicating his diabetes care.  he is  a candidate for weight loss. I discussed with him the fact that loss of 5 - 10% of his  current body weight will have the most impact on his diabetes management.  The above detailed  ACLM recommendations for nutrition, exercise, sleep, social life, avoidance of risky substances, the need for restorative sleep   information will also detailed on discharge instructions.  5) Chronic Care/Health Maintenance:  -he  is on ARB and does not tolerate statin medications and  is encouraged to initiate and continue to follow up with Ophthalmology, Dentist,  Podiatrist at least yearly or according to recommendations, and advised to  quit smoking. I have recommended yearly flu vaccine and pneumonia vaccine at least every 5 years; moderate intensity exercise for up to 150 minutes weekly; and  sleep for 7- 9 hours a day.  - he is  advised to maintain close follow up with [name], NP for primary care needs, as well as his other providers for optimal and coordinated care.   I spent 66 minutes in the care of the patient today including review of labs from CMP, Lipids, Thyroid Function, Hematology (current and previous including abstractions from other facilities);  face-to-face time discussing  his blood glucose readings/logs, discussing hypoglycemia and hyperglycemia episodes and symptoms, medications doses, his options of short and long term treatment based on the latest standards of care / guidelines;  discussion about incorporating lifestyle medicine;  and documenting the encounter. Risk reduction counseling performed per USPSTF guidelines to reduce obesity and cardiovascular risk factors.      Please refer to Patient Instructions for Blood Glucose Monitoring and Insulin/Medications Dosing Guide"  in media tab for additional information. Please  also refer to " Patient Self Inventory" in the Media  tab for reviewed elements of pertinent patient history.  Shane Bond participated in the discussions, expressed understanding, and voiced agreement with the above plans.  All questions were answered to his satisfaction. he is encouraged to contact clinic should he have any questions or concerns prior to his return visit.   Follow up plan: - Return in about 5 weeks (around 08/31/2022) for Bring Meter/CGM Device/Logs- A1c in Office.  Marquis Lunch, MD [facility] [address] [place], [place] [address] Phone: [phone]  Fax: [phone]    07/27/2022, 11:32 AM  This note was partially dictated with voice recognition software. Similar sounding words can be transcribed inadequately or may not  be corrected upon review.

## 2022-07-27 NOTE — Patient Instructions (Signed)

## 2022-07-28 ENCOUNTER — Other Ambulatory Visit (HOSPITAL_COMMUNITY): Payer: Self-pay

## 2022-07-28 ENCOUNTER — Other Ambulatory Visit: Payer: Self-pay

## 2022-08-05 ENCOUNTER — Other Ambulatory Visit: Payer: Self-pay

## 2022-08-08 ENCOUNTER — Other Ambulatory Visit: Payer: Self-pay

## 2022-08-11 ENCOUNTER — Other Ambulatory Visit: Payer: Self-pay

## 2022-08-11 ENCOUNTER — Other Ambulatory Visit (HOSPITAL_COMMUNITY): Payer: Self-pay

## 2022-08-15 ENCOUNTER — Other Ambulatory Visit (HOSPITAL_COMMUNITY): Payer: Self-pay

## 2022-08-15 ENCOUNTER — Other Ambulatory Visit: Payer: Self-pay

## 2022-08-15 ENCOUNTER — Ambulatory Visit: Payer: Medicaid Other | Attending: Nurse Practitioner | Admitting: Nurse Practitioner

## 2022-08-15 ENCOUNTER — Encounter: Payer: Self-pay | Admitting: Nurse Practitioner

## 2022-08-15 VITALS — BP 121/74 | HR 96 | Ht 73.0 in | Wt 241.2 lb

## 2022-08-15 DIAGNOSIS — F17200 Nicotine dependence, unspecified, uncomplicated: Secondary | ICD-10-CM

## 2022-08-15 DIAGNOSIS — D649 Anemia, unspecified: Secondary | ICD-10-CM | POA: Diagnosis not present

## 2022-08-15 DIAGNOSIS — Z794 Long term (current) use of insulin: Secondary | ICD-10-CM

## 2022-08-15 DIAGNOSIS — R21 Rash and other nonspecific skin eruption: Secondary | ICD-10-CM | POA: Diagnosis not present

## 2022-08-15 DIAGNOSIS — E1165 Type 2 diabetes mellitus with hyperglycemia: Secondary | ICD-10-CM | POA: Diagnosis not present

## 2022-08-15 DIAGNOSIS — E1169 Type 2 diabetes mellitus with other specified complication: Secondary | ICD-10-CM

## 2022-08-15 DIAGNOSIS — E785 Hyperlipidemia, unspecified: Secondary | ICD-10-CM

## 2022-08-15 LAB — POCT GLYCOSYLATED HEMOGLOBIN (HGB A1C): Hemoglobin A1C: 6.9 % — AB (ref 4.0–5.6)

## 2022-08-15 MED ORDER — ACCU-CHEK SOFTCLIX LANCETS MISC
12 refills | Status: DC
Start: 2022-08-15 — End: 2023-09-22
  Filled 2022-08-15 (×3): qty 100, 50d supply, fill #0
  Filled 2022-09-06 – 2022-09-27 (×3): qty 100, 50d supply, fill #1
  Filled 2022-11-15: qty 100, 50d supply, fill #2
  Filled 2022-12-29: qty 100, 50d supply, fill #3
  Filled 2023-02-13 (×2): qty 100, 50d supply, fill #4
  Filled 2023-03-27: qty 100, 50d supply, fill #5
  Filled 2023-05-05 (×2): qty 100, 50d supply, fill #6
  Filled 2023-06-19: qty 100, 50d supply, fill #7
  Filled 2023-08-10: qty 100, 50d supply, fill #8

## 2022-08-15 MED ORDER — CEFDINIR 300 MG PO CAPS
300.0000 mg | ORAL_CAPSULE | Freq: Two times a day (BID) | ORAL | 0 refills | Status: DC
  Filled 2022-08-15: qty 14, 7d supply, fill #0

## 2022-08-15 MED ORDER — OMEGA-3-ACID ETHYL ESTERS 1 G PO CAPS
2.0000 | ORAL_CAPSULE | Freq: Two times a day (BID) | ORAL | 3 refills | Status: DC
Start: 2022-08-15 — End: 2023-08-15
  Filled 2022-08-15 (×3): qty 360, 90d supply, fill #0
  Filled 2023-02-23: qty 360, 90d supply, fill #1
  Filled 2023-03-01: qty 360, 90d supply, fill #0
  Filled 2023-03-01: qty 360, 90d supply, fill #1
  Filled 2023-06-14: qty 360, 90d supply, fill #0

## 2022-08-15 MED ORDER — ACCU-CHEK GUIDE VI STRP
ORAL_STRIP | 2 refills | Status: DC
Start: 2022-08-15 — End: 2022-12-29
  Filled 2022-08-15 (×3): qty 100, 50d supply, fill #0
  Filled 2022-09-06 – 2022-09-27 (×3): qty 100, 50d supply, fill #1
  Filled 2022-10-26 – 2022-11-03 (×2): qty 100, 50d supply, fill #2
  Filled ????-??-??: fill #2

## 2022-08-15 MED ORDER — OZEMPIC (0.25 OR 0.5 MG/DOSE) 2 MG/3ML ~~LOC~~ SOPN
0.5000 mg | PEN_INJECTOR | SUBCUTANEOUS | 1 refills | Status: DC
Start: 2022-08-15 — End: 2022-11-25
  Filled 2022-08-15 – 2022-09-12 (×4): qty 3, 28d supply, fill #0
  Filled 2022-10-26 (×3): qty 3, 28d supply, fill #1

## 2022-08-15 MED ORDER — TRIAMCINOLONE ACETONIDE 0.1 % EX CREA
1.0000 | TOPICAL_CREAM | Freq: Two times a day (BID) | CUTANEOUS | 0 refills | Status: DC
Start: 2022-08-15 — End: 2022-12-07
  Filled 2022-08-15: qty 60, 30d supply, fill #0

## 2022-08-15 NOTE — Progress Notes (Signed)
Assessment & Plan:  [name] was seen today for diabetes.  Diagnoses and all orders for this visit:  Type 2 diabetes mellitus with hyperglycemia, with long-term current use of insulin (HCC) Decreased dose of ozempic from 1 to 0.5 due to eruption of rash on both hands  -     POCT glycosylated hemoglobin (Hb A1C) -     glucose blood (ACCU-CHEK GUIDE) test strip; Use as instructed to check blood glucose by fingerstick twice per day. -     Accu-Chek Softclix Lancets lancets; Use as instructed to check blood glucose level by fingerstick twice per day. -     CMP14+EGFR -     Semaglutide,0.25 or 0.5MG /DOS, (OZEMPIC, 0.25 OR 0.5 MG/DOSE,) 2 MG/3ML SOPN; Inject 0.5 mg into the skin once a week. DOSE CHANGE -     Ambulatory referral to Endocrinology  Hyperlipidemia associated with type 2 diabetes mellitus (HCC) -     omega-3 acid ethyl esters (LOVAZA) 1 g capsule; Take 2 capsules (2 g total) by mouth 2 (two) times daily. Please fill as a 90 day supply -     Lipid panel INSTRUCTIONS: Work on a low fat, heart healthy diet and participate in regular aerobic exercise program by working out at least 150 minutes per week; 5 days a week-30 minutes per day. Avoid red meat/beef/steak,  fried foods. junk foods, sodas, sugary drinks, unhealthy snacking, alcohol and smoking.  Drink at least 80 oz of water per day and monitor your carbohydrate intake daily.    Normocytic anemia -     CBC with Differential  Rash of both hands -     triamcinolone cream (KENALOG) 0.1 %; Apply 1 Application topically 2 (two) times daily. Apply to both hands  Tobacco dependence -     CT CHEST LUNG CA SCREEN LOW DOSE W/O CM; Future    Patient has been counseled on age-appropriate routine health concerns for screening and prevention. These are reviewed and up-to-date. Referrals have been placed accordingly. Immunizations are up-to-date or declined.    Subjective:   Chief Complaint  Patient presents with   Diabetes    HPI Shane Bond 62 y.o. 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    DM 2 Diabetes is improving gradually. Monitoring blood glucose levels twice a day. Reports readings as "normal".  Has seen a few 80s in the am. Currently taking metformin 1000 mg twice daily Farxiga 10 mg daily, Lantus 40 units BID and ozempic 1 mg weekly. However he now has a midly erythematous rash with skin peeling on the palms of both hands which he states erupted after his ozempic was increased.  He is on ARB and taking Zetia and omega-3 for hyperlipidemia. LDL not at goal. Lab Results  Component Value Date   HGBA1C 6.9 (A) 08/15/2022    Lab Results  Component Value Date   HGBA1C 7.2 (A) 05/13/2022      HTN Blood pressure is well controlled with losartan 25 mg daily,  BP Readings from Last 3 Encounters:  08/15/22 121/74  07/27/22 124/66  06/24/22 113/77       Review of Systems  Constitutional:  Negative for fever, malaise/fatigue and weight loss.  HENT: Negative.  Negative for nosebleeds.   Eyes: Negative.  Negative for blurred vision, double vision and photophobia.  Respiratory: Negative.  Negative for cough and shortness of breath.   Cardiovascular: Negative.  Negative for chest pain, palpitations and leg swelling.  Gastrointestinal: Negative.  Negative for heartburn, nausea and vomiting.  Genitourinary:  Negative for dysuria, flank pain, frequency, hematuria and urgency.       Self cathing  Musculoskeletal: Negative.  Negative for myalgias.  Skin:  Positive for itching and rash.  Neurological: Negative.  Negative for dizziness, focal  weakness, seizures and headaches.  Psychiatric/Behavioral: Negative.  Negative for suicidal ideas.     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   Anemia    Bladder atonia    BPH (benign prostatic hyperplasia)    CAD, NATIVE VESSEL 10/29/2009   Diabetes mellitus type 2, uncontrolled, with complications    Hyperlipidemia LDL goal <70    Hypertension    Kidney stone    Neurogenic bladder as late effect of cerebrovascular accident (CVA)    OBESITY 10/22/2009   Prostate cancer [facility])     Past Surgical History:  Procedure Laterality Date   BACK SURGERY     CORONARY STENT PLACEMENT     CYSTOSCOPY W/ URETERAL STENT PLACEMENT  02/26/2012   Procedure: CYSTOSCOPY WITH RETROGRADE PYELOGRAM/URETERAL STENT PLACEMENT;  Surgeon: Valetta Fuller, MD;  Location: [facility];  Service: Urology;  Laterality: Left;   CYSTOSCOPY WITH RETROGRADE PYELOGRAM, URETEROSCOPY AND STENT PLACEMENT Left 08/07/2019   Procedure: CYSTOSCOPY WITH LEFT RETROGRADE PYELOGRAM, URETEROSCOPY HOLMIUM LASER AND STENT PLACEMENT;  Surgeon: [name], MD;  Location: [facility];  Service: Urology;  Laterality: Left;   CYSTOSCOPY WITH STENT PLACEMENT Left 06/24/2019   Procedure: CYSTOSCOPY WITH STENT PLACEMENT LEFT URETER WITH LEFT RETROGRADE URETERAL;  Surgeon: Rene Paci, MD;  Location: [facility];  Service: Urology;  Laterality: Left;   testicles Right     Family History  Problem Relation Age of Onset   Hypertension Mother    Diabetes Father    Stomach cancer Maternal Uncle    Diabetes Paternal Aunt    Coronary artery disease Other    Heart attack Other    Breast cancer Neg Hx    Colon cancer Neg Hx    Pancreatic cancer Neg Hx    Prostate cancer Neg Hx    Esophageal cancer Neg Hx    Liver disease Neg Hx     Social History Reviewed with no changes to be made today.   Outpatient Medications Prior to Visit  Medication Sig Dispense Refill   Aspirin 81 MG CAPS Take 1 tablet by mouth  daily.     dapagliflozin propanediol (FARXIGA) 10 MG TABS tablet Take 1 tablet (10 mg total) by mouth daily before breakfast. 90 tablet 1   ezetimibe (ZETIA) 10 MG tablet Take 1 tablet (10 mg total) by mouth daily. 90 tablet 3   insulin glargine (LANTUS) 100 UNIT/ML Solostar Pen Inject 40 Units into the skin at bedtime. 45 mL 1   Insulin Pen Needle (TECHLITE PEN NEEDLES) 32G X 4 MM MISC Inject as directed 2 (two) times daily. 100 each 3   losartan (COZAAR) 25 MG tablet Take 1 tablet (25 mg total) by mouth daily. 90 tablet 1   Misc. Devices (CAREX COCCYX CUSHION) MISC Use daily when sitting for pressure relief ICD10 L98.429 1 each prn   Misc. Devices MISC Please provide patient with standard sized  51F urinary catheters for self cathing along with lubricant. 50 each prn   Multiple Vitamin (MULTIVITAMIN ADULT PO) Take 1 tablet by mouth daily.     glucose blood (ACCU-CHEK GUIDE) test strip Use as instructed to check blood glucose by fingerstick twice per day. 100 each 2   Semaglutide, 1 MG/DOSE, 4 MG/3ML SOPN Inject 1 mg as directed once a week. 3 mL 1   Blood Glucose Monitoring Suppl (ACCU-CHEK GUIDE) w/Device KIT Use as instructed to check blood glucose level by fingerstick twice per day (Patient not taking: Reported on 08/15/2022) 1 kit 0   Accu-Chek Softclix Lancets lancets Use as instructed to check blood glucose level by fingerstick twice per day. (Patient not taking: Reported on 08/15/2022) 100 each 12   amoxicillin-clavulanate (AUGMENTIN) 875-125 MG tablet Take 1 tablet by mouth every 12 (twelve) hours. (Patient not taking: Reported on 07/27/2022) 20 tablet 0   omega-3 acid ethyl esters (LOVAZA) 1 g capsule Take 2 capsules (2 g total) by mouth 2 (two) times daily. Please fill as a 90 day supply 360 capsule 3   No facility-administered medications prior to visit.    Allergies  Allergen Reactions   Lipitor [Atorvastatin] Rash   Lisinopril Rash       Objective:    BP 121/74 (BP Location:  Left Arm, Patient Position: Sitting, Cuff Size: Normal)   Pulse 96   Ht 6\' 1"  (1.854 m)   Wt 241 lb 3.2 oz (109.4 kg)   SpO2 96%   BMI 31.82 kg/m  Wt Readings from Last 3 Encounters:  08/15/22 241 lb 3.2 oz (109.4 kg)  07/27/22 244 lb 9.6 oz (110.9 kg)  05/13/22 225 lb 6.4 oz (102.2 kg)    Physical Exam Vitals and nursing note reviewed.  Constitutional:      Appearance: He is well-developed.  HENT:     Head: Normocephalic and atraumatic.  Cardiovascular:     Rate and Rhythm: Normal rate and regular rhythm.     Heart sounds: Normal heart sounds. No murmur heard.    No friction rub. No gallop.  Pulmonary:     Effort: Pulmonary effort is normal. No tachypnea or respiratory distress.     Breath sounds: Normal breath sounds. No decreased breath sounds, wheezing, rhonchi or rales.  Chest:     Chest wall: No tenderness.  Abdominal:     General: Bowel sounds are normal.     Palpations: Abdomen is soft.  Musculoskeletal:        General: Normal range of motion.     Cervical back: Normal range of motion.  Skin:    General: Skin is warm and dry.     Findings: Rash present. Rash is macular.  Neurological:     Mental Status: He is alert and oriented to person, place, and time.     Coordination: Coordination normal.  Psychiatric:        Behavior: Behavior normal. Behavior is cooperative.        Thought Content: Thought content normal.        Judgment: Judgment normal.          Patient has been counseled extensively about nutrition and exercise as well as the importance of adherence with medications and regular follow-up. The patient was given clear instructions to go to ER or return to medical center if symptoms don't improve, worsen or new problems develop. The patient verbalized understanding.   Follow-up: Return in about 3 months (around 11/15/2022).   [name],  FNP-BC [facility] [place], [place] [phone]   08/15/2022, 12:12  PM

## 2022-08-16 LAB — CMP14+EGFR
ALT: 53 IU/L — ABNORMAL HIGH (ref 0–44)
AST: 39 IU/L (ref 0–40)
Albumin: 4.1 g/dL (ref 3.9–4.9)
Alkaline Phosphatase: 114 IU/L (ref 44–121)
BUN/Creatinine Ratio: 23 (ref 10–24)
BUN: 17 mg/dL (ref 8–27)
Bilirubin Total: 0.3 mg/dL (ref 0.0–1.2)
CO2: 20 mmol/L (ref 20–29)
Calcium: 9.7 mg/dL (ref 8.6–10.2)
Chloride: 103 mmol/L (ref 96–106)
Creatinine, Ser: 0.73 mg/dL — ABNORMAL LOW (ref 0.76–1.27)
Globulin, Total: 3.5 g/dL (ref 1.5–4.5)
Glucose: 102 mg/dL — ABNORMAL HIGH (ref 70–99)
Potassium: 4.3 mmol/L (ref 3.5–5.2)
Sodium: 139 mmol/L (ref 134–144)
Total Protein: 7.6 g/dL (ref 6.0–8.5)
eGFR: 104 mL/min/{1.73_m2} (ref >59)

## 2022-08-16 LAB — CBC WITH DIFFERENTIAL/PLATELET
Basophils Absolute: 0.1 10*3/uL (ref 0.0–0.2)
Basos: 1 %
EOS (ABSOLUTE): 0.2 10*3/uL (ref 0.0–0.4)
Eos: 2 %
Hematocrit: 40.9 % (ref 37.5–51.0)
Hemoglobin: 14 g/dL (ref 13.0–17.7)
Immature Grans (Abs): 0 10*3/uL (ref 0.0–0.1)
Immature Granulocytes: 0 %
Lymphocytes Absolute: 1.4 10*3/uL (ref 0.7–3.1)
Lymphs: 19 %
MCH: 32.6 pg (ref 26.6–33.0)
MCHC: 34.2 g/dL (ref 31.5–35.7)
MCV: 95 fL (ref 79–97)
Monocytes Absolute: 0.6 10*3/uL (ref 0.1–0.9)
Monocytes: 8 %
Neutrophils Absolute: 5.2 10*3/uL (ref 1.4–7.0)
Neutrophils: 70 %
Platelets: 225 10*3/uL (ref 150–450)
RBC: 4.3 x10E6/uL (ref 4.14–5.80)
RDW: 13.1 % (ref 11.6–15.4)
WBC: 7.5 10*3/uL (ref 3.4–10.8)

## 2022-08-16 LAB — LIPID PANEL
Chol/HDL Ratio: 4 ratio (ref 0.0–5.0)
Cholesterol, Total: 151 mg/dL (ref 100–199)
HDL: 38 mg/dL — ABNORMAL LOW (ref >39)
LDL Chol Calc (NIH): 95 mg/dL (ref 0–99)
Triglycerides: 99 mg/dL (ref 0–149)
VLDL Cholesterol Cal: 18 mg/dL (ref 5–40)

## 2022-08-17 ENCOUNTER — Other Ambulatory Visit: Payer: Self-pay

## 2022-08-18 NOTE — Progress Notes (Signed)
Appointment scheduled.

## 2022-08-19 ENCOUNTER — Encounter: Payer: Self-pay | Admitting: Nurse Practitioner

## 2022-08-19 ENCOUNTER — Other Ambulatory Visit: Payer: Self-pay

## 2022-08-19 MED ORDER — SULFAMETHOXAZOLE-TRIMETHOPRIM 800-160 MG PO TABS
ORAL_TABLET | ORAL | 0 refills | Status: DC
  Filled 2022-08-19 (×2): qty 20, 10d supply, fill #0

## 2022-08-30 ENCOUNTER — Encounter: Payer: Self-pay | Admitting: Nurse Practitioner

## 2022-08-31 ENCOUNTER — Ambulatory Visit: Payer: Medicaid Other

## 2022-09-01 ENCOUNTER — Ambulatory Visit: Payer: Medicaid Other | Admitting: "Endocrinology

## 2022-09-06 ENCOUNTER — Other Ambulatory Visit: Payer: Self-pay

## 2022-09-06 ENCOUNTER — Other Ambulatory Visit: Payer: Self-pay | Admitting: Family Medicine

## 2022-09-06 ENCOUNTER — Other Ambulatory Visit: Payer: Self-pay | Admitting: Physician Assistant

## 2022-09-06 DIAGNOSIS — E1165 Type 2 diabetes mellitus with hyperglycemia: Secondary | ICD-10-CM

## 2022-09-06 MED ORDER — TECHLITE PEN NEEDLES 32G X 4 MM MISC
1.0000 | Freq: Two times a day (BID) | 3 refills | Status: DC
Start: 2022-09-06 — End: 2023-04-11
  Filled 2022-09-06 – 2022-10-26 (×2): qty 100, 50d supply, fill #0
  Filled 2022-10-26 – 2022-12-08 (×2): qty 100, 50d supply, fill #1
  Filled 2023-02-13 (×2): qty 100, 50d supply, fill #2

## 2022-09-06 MED ORDER — LOSARTAN POTASSIUM 25 MG PO TABS
25.0000 mg | ORAL_TABLET | Freq: Every day | ORAL | 1 refills | Status: DC
  Filled 2022-09-06: qty 90, 90d supply, fill #0
  Filled 2022-12-08: qty 90, 90d supply, fill #1

## 2022-09-07 ENCOUNTER — Other Ambulatory Visit (HOSPITAL_COMMUNITY): Payer: Self-pay

## 2022-09-07 ENCOUNTER — Other Ambulatory Visit: Payer: Self-pay

## 2022-09-09 ENCOUNTER — Other Ambulatory Visit: Payer: Self-pay

## 2022-09-09 ENCOUNTER — Other Ambulatory Visit (HOSPITAL_COMMUNITY): Payer: Self-pay

## 2022-09-12 ENCOUNTER — Encounter (HOSPITAL_COMMUNITY): Payer: Self-pay

## 2022-09-12 ENCOUNTER — Ambulatory Visit (HOSPITAL_COMMUNITY)
Admission: EM | Admit: 2022-09-12 | Discharge: 2022-09-12 | Disposition: A | Discharge status: Home / Self Care | Payer: Medicaid Other | Attending: Family Medicine | Admitting: Family Medicine

## 2022-09-12 ENCOUNTER — Other Ambulatory Visit: Payer: Self-pay

## 2022-09-12 DIAGNOSIS — N390 Urinary tract infection, site not specified: Secondary | ICD-10-CM | POA: Diagnosis not present

## 2022-09-12 DIAGNOSIS — R319 Hematuria, unspecified: Secondary | ICD-10-CM | POA: Insufficient documentation

## 2022-09-12 LAB — POCT URINALYSIS DIP (MANUAL ENTRY)
Bilirubin, UA: NEGATIVE
Glucose, UA: >=1000 mg/dL — AB
Ketones, POC UA: NEGATIVE mg/dL
Nitrite, UA: POSITIVE — AB
Protein Ur, POC: 30 mg/dL — AB
Spec Grav, UA: 1.02 (ref 1.010–1.025)
Urobilinogen, UA: 0.2 E.U./dL
pH, UA: 6 (ref 5.0–8.0)

## 2022-09-12 MED ORDER — PHENAZOPYRIDINE HCL 100 MG PO TABS
100.0000 mg | ORAL_TABLET | Freq: Three times a day (TID) | ORAL | 0 refills | Status: DC | PRN
  Filled 2022-09-12 (×2): qty 10, 4d supply, fill #0

## 2022-09-12 MED ORDER — CIPROFLOXACIN HCL 500 MG PO TABS
500.0000 mg | ORAL_TABLET | Freq: Two times a day (BID) | ORAL | 0 refills | Status: AC
  Filled 2022-09-12 (×2): qty 14, 7d supply, fill #0

## 2022-09-12 NOTE — Discharge Instructions (Addendum)
Take Cipro 500 mg--1 tablet 2 times daily for 7 days; this is the antibiotic  Take Pyridium/phenazopyridine 100 mg--1 tablet 3 times daily as needed for urinary pain.  This medication usually makes the urine orange   We will notify if the urine culture shows you need a different antibiotic.  Please let your urologist know you are having frequent trouble with UTI

## 2022-09-12 NOTE — ED Provider Notes (Signed)
[facility]    CSN: [id number] Arrival date & time: 09/12/22  4098      History   Chief Complaint Chief Complaint  Patient presents with   Urinary Frequency    HPI Shane Bond is a 62 y.o. male.    Urinary Frequency  Here for dysuria and hematuria. Began this AM. No n/v/f/c.   Does in and out caths 10-15 times daily. Already established with urology.  Has had several UTI's in the last 4 months.   Has DM, sugars ok recently.  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   Anemia    Bladder atonia    BPH (benign prostatic hyperplasia)    CAD, NATIVE VESSEL 10/29/2009   Diabetes mellitus type 2, uncontrolled, with complications    Hyperlipidemia LDL goal <70    Hypertension    Kidney stone    Neurogenic bladder as late effect of cerebrovascular accident (CVA)    OBESITY 10/22/2009   Prostate cancer [facility])     Patient Active Problem List   Diagnosis Date Noted   Essential hypertension, benign 07/27/2022   Insulin long-term use (HCC) 07/27/2022   Malignant neoplasm of prostate (HCC) 09/03/2019   Hydronephrosis with renal and ureteral calculus obstruction 06/24/2019   Thrombocytopenia (HCC) 06/21/2019   Normocytic anemia 06/21/2019   Bacteremia due to Klebsiella pneumoniae 06/21/2019   Lung infiltrate 06/20/2019   Sepsis due to urinary tract infection (HCC) 06/19/2019   Hyperkalemia 06/19/2019   Acute kidney injury (HCC) 06/19/2019   DM type 2 causing vascular disease (HCC) 04/13/2017   Diabetes mellitus without complication (HCC) 12/01/2016   Former smoker 11/02/2015   Diabetes type 2, uncontrolled 06/18/2015   Mixed hyperlipidemia 06/18/2015   BPH (benign prostatic hyperplasia) 06/18/2015   Self-catheterizes urinary bladder 06/18/2015   Hypertension associated with diabetes (HCC) 07/08/2010   CAD, NATIVE VESSEL 10/29/2009   CAD (coronary artery disease), native coronary artery 10/29/2009   Current smoker 10/22/2009    AMI, INFERIOR WALL 10/22/2009    Past Surgical History:  Procedure Laterality Date   BACK SURGERY     CORONARY STENT PLACEMENT     CYSTOSCOPY W/ URETERAL STENT PLACEMENT  02/26/2012   Procedure: CYSTOSCOPY WITH RETROGRADE PYELOGRAM/URETERAL STENT PLACEMENT;  Surgeon: Valetta Fuller, MD;  Location: [facility];  Service: Urology;  Laterality: Left;   CYSTOSCOPY WITH RETROGRADE PYELOGRAM, URETEROSCOPY AND STENT PLACEMENT Left 08/07/2019   Procedure: CYSTOSCOPY WITH LEFT RETROGRADE PYELOGRAM, URETEROSCOPY HOLMIUM LASER AND STENT PLACEMENT;  Surgeon: [name], MD;  Location: [facility];  Service: Urology;  Laterality: Left;   CYSTOSCOPY WITH STENT PLACEMENT Left 06/24/2019   Procedure: CYSTOSCOPY WITH STENT PLACEMENT LEFT URETER WITH LEFT RETROGRADE URETERAL;  Surgeon: Rene Paci, MD;  Location: [facility];  Service: Urology;  Laterality: Left;   testicles Right        Home Medications    Prior to Admission medications   Medication Sig Start Date End Date Taking? Authorizing Provider  Aspirin 81 MG CAPS Take 1 tablet by mouth daily.   Yes [provider]  ciprofloxacin (CIPRO) 500 MG tablet Take 1 tablet (500 mg total) by mouth 2 (two) times daily for 7 days. 09/12/22 09/19/22 Yes Zenia Resides, MD  dapagliflozin propanediol (FARXIGA) 10 MG TABS tablet Take 1 tablet (10 mg total) by mouth daily before breakfast. 05/13/22  Yes [name], NP  ezetimibe (ZETIA) 10 MG tablet Take 1 tablet (10 mg total) by mouth  daily. 05/14/22  Yes [name], NP  insulin glargine (LANTUS) 100 UNIT/ML Solostar Pen Inject 40 Units into the skin at bedtime. 07/27/22  Yes Hoy Register, MD  losartan (COZAAR) 25 MG tablet Take 1 tablet (25 mg total) by mouth daily. 09/06/22  Yes Hoy Register, MD  Multiple Vitamin (MULTIVITAMIN ADULT PO) Take 1 tablet by mouth daily.   Yes [provider]  omega-3 acid ethyl esters (LOVAZA) 1 g capsule Take 2 capsules (2 g total) by mouth 2  (two) times daily. Please fill as a 90 day supply 08/15/22  Yes [name], NP  phenazopyridine (PYRIDIUM) 100 MG tablet Take 1 tablet (100 mg total) by mouth 3 (three) times daily as needed (urinary pain). 09/12/22  Yes Zenia Resides, MD  Semaglutide,0.25 or 0.5MG /DOS, (OZEMPIC, 0.25 OR 0.5 MG/DOSE,) 2 MG/3ML SOPN Inject 0.5 mg into the skin once a week. DOSE CHANGE 08/15/22  Yes [name], NP  Accu-Chek Softclix Lancets lancets Use as instructed to check blood glucose level by fingerstick twice per day. 08/15/22   [name], NP  Blood Glucose Monitoring Suppl (ACCU-CHEK GUIDE) w/Device KIT Use as instructed to check blood glucose level by fingerstick twice per day Patient not taking: Reported on 08/15/2022 04/26/21   [name], NP  glucose blood (ACCU-CHEK GUIDE) test strip Use as instructed to check blood glucose by fingerstick twice per day. 08/15/22   [name], NP  Insulin Pen Needle (TECHLITE PEN NEEDLES) 32G X 4 MM MISC Inject as directed 2 (two) times daily. 09/06/22   Hoy Register, MD  Misc. Devices (CAREX COCCYX CUSHION) MISC Use daily when sitting for pressure relief ICD10 L98.429 07/02/20   [name], NP  Misc. Devices MISC Please provide patient with standard sized 24F urinary catheters for self cathing along with lubricant. 05/29/18   [name], NP  triamcinolone cream (KENALOG) 0.1 % Apply 1 Application topically 2 (two) times daily. Apply to both hands 08/15/22   [name], NP  gemfibrozil (LOPID) 600 MG tablet Take 1 tablet (600 mg total) by mouth 2 (two) times daily before a meal. 07/20/19 11/04/19  [name], NP    Family History Family History  Problem Relation Age of Onset   Hypertension Mother    Diabetes Father    Stomach cancer Maternal Uncle    Diabetes Paternal Aunt    Coronary artery disease Other    Heart attack Other    Breast cancer Neg Hx    Colon cancer Neg Hx    Pancreatic cancer Neg Hx    Prostate  cancer Neg Hx    Esophageal cancer Neg Hx    Liver disease Neg Hx     Social History Social History   Tobacco Use   Smoking status: Every Day    Current packs/day: 0.50    Average packs/day: 0.5 packs/day for 40.0 years (20.0 ttl pk-yrs)    Types: E-cigarettes, Cigarettes   Smokeless tobacco: Former  Advertising account planner   Vaping status: Former  Substance Use Topics   Alcohol use: Not Currently    Comment: occ   Drug use: No     Allergies   Lipitor [atorvastatin] and Lisinopril   Review of Systems Review of Systems  Genitourinary:  Positive for frequency.     Physical Exam Triage Vital Signs ED Triage Vitals  Encounter Vitals Group     BP 09/12/22 0822 125/81     Systolic BP Percentile --  Diastolic BP Percentile --      Pulse Rate 09/12/22 0822 79     Resp 09/12/22 0822 16     Temp 09/12/22 0822 98.1 F (36.7 C)     Temp Source 09/12/22 0822 Oral     SpO2 09/12/22 0822 95 %     Weight 09/12/22 0821 245 lb (111.1 kg)     Height 09/12/22 0821 6\' 1"  (1.854 m)     Head Circumference --      Peak Flow --      Pain Score 09/12/22 0821 5     Pain Loc --      Pain Education --      Exclude from Growth Chart --    No data found.  Updated Vital Signs BP 125/81 (BP Location: Right Arm)   Pulse 79   Temp 98.1 F (36.7 C) (Oral)   Resp 16   Ht 6\' 1"  (1.854 m)   Wt 111.1 kg   SpO2 95%   BMI 32.32 kg/m   Visual Acuity Right Eye Distance:   Left Eye Distance:   Bilateral Distance:    Right Eye Near:   Left Eye Near:    Bilateral Near:     Physical Exam Vitals reviewed.  Constitutional:      General: He is not in acute distress.    Appearance: He is not ill-appearing, toxic-appearing or diaphoretic.  HENT:     Mouth/Throat:     Mouth: Mucous membranes are moist.  Eyes:     Extraocular Movements: Extraocular movements intact.     Conjunctiva/sclera: Conjunctivae normal.     Pupils: Pupils are equal, round, and reactive to light.  Cardiovascular:      Rate and Rhythm: Normal rate and regular rhythm.     Heart sounds: No murmur heard. Pulmonary:     Effort: Pulmonary effort is normal.     Breath sounds: Normal breath sounds.  Abdominal:     Palpations: Abdomen is soft.     Tenderness: There is no abdominal tenderness.  Skin:    Capillary Refill: Capillary refill takes less than 2 seconds.     Coloration: Skin is not jaundiced or pale.  Neurological:     General: No focal deficit present.     Mental Status: He is alert and oriented to person, place, and time.  Psychiatric:        Behavior: Behavior normal.      UC Treatments / Results  Labs (all labs ordered are listed, but only abnormal results are displayed) Labs Reviewed  POCT URINALYSIS DIP (MANUAL ENTRY) - Abnormal; Notable for the following components:      Result Value   Color, UA straw (*)    Clarity, UA cloudy (*)    Glucose, UA >=1,000 (*)    Blood, UA large (*)    Protein Ur, POC =30 (*)    Nitrite, UA Positive (*)    Leukocytes, UA Small (1+) (*)    All other components within normal limits  URINE CULTURE    EKG   Radiology No results found.  Procedures Procedures (including critical care time)  Medications Ordered in UC Medications - No data to display  Initial Impression / Assessment and Plan / UC Course  I have reviewed the triage vital signs and the nursing notes.  Pertinent labs & imaging results that were available during my care of the patient were reviewed by me and considered in my medical decision making (see chart for  details).       There is a large amount of blood, some leuks and nitrties. Also, >1000 glucose, but he is on farxiga Cipro sent for UTI. C/S done. I have asked him to please let his urologist know he is having such frequent UTI. Pyridium sent for the dysuria. Final Clinical Impressions(s) / UC Diagnoses   Final diagnoses:  Urinary tract infection with hematuria, site unspecified     Discharge Instructions       Take Cipro 500 mg--1 tablet 2 times daily for 7 days; this is the antibiotic  Take Pyridium/phenazopyridine 100 mg--1 tablet 3 times daily as needed for urinary pain.  This medication usually makes the urine orange   We will notify if the urine culture shows you need a different antibiotic.  Please let your urologist know you are having frequent trouble with UTI       ED Prescriptions     Medication Sig Dispense Auth. Provider   ciprofloxacin (CIPRO) 500 MG tablet Take 1 tablet (500 mg total) by mouth 2 (two) times daily for 7 days. 14 tablet Laiya Wisby, Janace Aris, MD   phenazopyridine (PYRIDIUM) 100 MG tablet Take 1 tablet (100 mg total) by mouth 3 (three) times daily as needed (urinary pain). 10 tablet Marlinda Mike Janace Aris, MD      PDMP not reviewed this encounter.   Zenia Resides, MD 09/12/22 [phone]

## 2022-09-12 NOTE — ED Triage Notes (Signed)
Patient here today with c/o burning in urination and frequency that started a week or two ago. He uses a catheter. He has recently been on a couple different antibiotics.

## 2022-09-13 ENCOUNTER — Other Ambulatory Visit: Payer: Self-pay

## 2022-09-13 LAB — URINE CULTURE: Culture: >=100000 — AB

## 2022-09-14 ENCOUNTER — Telehealth: Payer: Self-pay

## 2022-09-14 ENCOUNTER — Other Ambulatory Visit: Payer: Self-pay

## 2022-09-14 LAB — URINE CULTURE

## 2022-09-14 NOTE — Telephone Encounter (Signed)
A prior authorization request for Ozempic has been submitted to insurance today via CoverMyMeds Key: [id number]

## 2022-09-16 ENCOUNTER — Other Ambulatory Visit: Payer: Self-pay

## 2022-09-20 ENCOUNTER — Other Ambulatory Visit: Payer: Self-pay | Admitting: Family Medicine

## 2022-09-20 ENCOUNTER — Other Ambulatory Visit: Payer: Self-pay

## 2022-09-20 ENCOUNTER — Telehealth: Payer: Self-pay | Admitting: Nurse Practitioner

## 2022-09-20 DIAGNOSIS — E1165 Type 2 diabetes mellitus with hyperglycemia: Secondary | ICD-10-CM

## 2022-09-20 NOTE — Telephone Encounter (Signed)
Unable to reach patient by phone to relay results.  Unable to leave voicemail. Patient will need to discuss his concerns with his endocrinologist.

## 2022-09-20 NOTE — Telephone Encounter (Signed)
Patient called nurse back and request his call be returned in reference to some questions he has about the medication change made by his Endo provider. Please f/u with patient

## 2022-09-20 NOTE — Telephone Encounter (Signed)
Copied from CRM [id number]. Topic: General - Other >> Sep 20, 2022 12:30 PM Turkey B wrote: Reason for CRM: Pt's sister called in states Endocrinologist in [place], lowered the amount of insulin pt is to get and also says that Dr wanted him to start eating foods with soy that he can't afford, so he has concerns about this. Please call back

## 2022-09-21 ENCOUNTER — Other Ambulatory Visit: Payer: Self-pay

## 2022-09-21 MED ORDER — LANTUS SOLOSTAR 100 UNIT/ML ~~LOC~~ SOPN
40.0000 [IU] | PEN_INJECTOR | Freq: Two times a day (BID) | SUBCUTANEOUS | 3 refills | Status: DC
Start: 2022-09-21 — End: 2023-02-13
  Filled 2022-09-21 – 2022-10-05 (×3): qty 24, 30d supply, fill #0
  Filled 2022-11-03: qty 24, 30d supply, fill #1
  Filled 2022-12-08 (×2): qty 24, 30d supply, fill #0
  Filled 2023-01-09: qty 24, 30d supply, fill #1

## 2022-09-21 NOTE — Telephone Encounter (Signed)
Return call unanswered.  

## 2022-09-27 ENCOUNTER — Other Ambulatory Visit: Payer: Self-pay

## 2022-09-27 ENCOUNTER — Other Ambulatory Visit (HOSPITAL_COMMUNITY): Payer: Self-pay

## 2022-09-29 ENCOUNTER — Encounter: Payer: Medicaid Other | Attending: Nurse Practitioner | Admitting: Nutrition

## 2022-09-29 DIAGNOSIS — E782 Mixed hyperlipidemia: Secondary | ICD-10-CM | POA: Diagnosis present

## 2022-09-29 DIAGNOSIS — I152 Hypertension secondary to endocrine disorders: Secondary | ICD-10-CM | POA: Diagnosis present

## 2022-09-29 DIAGNOSIS — E1159 Type 2 diabetes mellitus with other circulatory complications: Secondary | ICD-10-CM | POA: Diagnosis present

## 2022-09-29 DIAGNOSIS — I10 Essential (primary) hypertension: Secondary | ICD-10-CM | POA: Diagnosis present

## 2022-09-29 DIAGNOSIS — I2510 Atherosclerotic heart disease of native coronary artery without angina pectoris: Secondary | ICD-10-CM | POA: Insufficient documentation

## 2022-09-29 NOTE — Patient Instructions (Addendum)
Goals  Cut out bologna and cheese and choose more fruits, dried beans, vegetables and whole grains. Drink only water Walk 15 minutes a day Get A1C to 6.5% or below. Lose 1 lb per week.

## 2022-09-29 NOTE — Progress Notes (Signed)
Medical Nutrition Therapy  Appointment Start time:  1000  Appointment End time:  1100  Primary concerns today: Diabetes Type 2, OBesity, Hyperlipidemia  Referral diagnosis: E11,42m E66.9, E78 Preferred learning style: No preference  Learning readiness: Ready  (not ready, contemplating, ready, change in progress)   NUTRITION ASSESSMENT  62 yr old wmale referred for Type 2 Dm, Obesity and Hyperlipidemia. He sees Dr. Fransico Him, Endocrinology.  Currently on 40 units of Lantus BID, Farxiga, Lovaza and Ozempic. FBS 105-109 mg/dl. Meter 7 days avg 145 mg/dl, 14 day avg 657 mg/dl.   He is willing to work on Lifestyle Medicine to improve his health.  Anthropometrics  Wt Readings from Last 3 Encounters:  09/12/22 245 lb (111.1 kg)  08/15/22 241 lb 3.2 oz (109.4 kg)  07/27/22 244 lb 9.6 oz (110.9 kg)   Ht Readings from Last 3 Encounters:  09/12/22 6\' 1"  (1.854 m)  08/15/22 6\' 1"  (1.854 m)  07/27/22 6\' 1"  (1.854 m)   There is no height or weight on file to calculate BMI. @BMIFA @ Facility age limit for growth %iles is 20 years. Facility age limit for growth %iles is 20 years.    Clinical Medical Hx: See chart Medications: 40 Lantus BID, Fargixa, and Ozempic Labs:  Lab Results  Component Value Date   HGBA1C 6.9 (A) 08/15/2022      Latest Ref Rng & Units 08/15/2022   11:52 AM 05/13/2022    2:19 PM 02/10/2022   10:08 AM  CMP  Glucose 70 - 99 mg/dL 846  962  952   BUN 8 - 27 mg/dL 17  17  15    Creatinine 0.76 - 1.27 mg/dL 8.41  3.24  4.01   Sodium 134 - 144 mmol/L 139  142  137   Potassium 3.5 - 5.2 mmol/L 4.3  4.4  4.6   Chloride 96 - 106 mmol/L 103  104  102   CO2 20 - 29 mmol/L 20  22  23    Calcium 8.6 - 10.2 mg/dL 9.7  9.7  9.6   Total Protein 6.0 - 8.5 g/dL 7.6  7.5  7.2   Total Bilirubin 0.0 - 1.2 mg/dL 0.3  0.2  0.3   Alkaline Phos 44 - 121 IU/L 114  117  104   AST 0 - 40 IU/L 39  68  28   ALT 0 - 44 IU/L 53  70  46    Lipid Panel     Component Value Date/Time   CHOL  151 08/15/2022 1152   TRIG 99 08/15/2022 1152   HDL 38 (L) 08/15/2022 1152   CHOLHDL 4.0 08/15/2022 1152   CHOLHDL 5.1 (H) 06/18/2015 1116   VLDL 32 (H) 06/18/2015 1116   LDLCALC 95 08/15/2022 1152   LDLDIRECT 87.7 11/09/2009 1026   LABVLDL 18 08/15/2022 1152    Notable Signs/Symptoms: None  Lifestyle & Dietary Hx Lives with friends. Eats 2 meals per day   Estimated daily fluid intake: 40 oz Supplements:  Sleep:  Stress / self-care:  Current average weekly physical activity: Walks some  24-Hr Dietary Recall First Meal: 2 bologna cheese sandwich on sf bread, coffee with sf creamer, sweetnes Snack:  Second Meal:  Snack:  Third Meal: Same a breakfast. Snack:  Beverages: water, SF sodas.  Estimated Energy Needs Calories: 1800 Carbohydrate: 200g Protein: 135g Fat: 50g   NUTRITION DIAGNOSIS  NB-1.1 Food and nutrition-related knowledge deficit As related to Diabetes Type 2.  As evidenced by A1C 7.2% but now down to  6.9%.   NUTRITION INTERVENTION  Nutrition education (E-1) on the following topics:  Nutrition and Diabetes education provided on My Plate, CHO counting, meal planning, portion sizes, timing of meals, avoiding snacks between meals unless having a low blood sugar, target ranges for A1C and blood sugars, signs/symptoms and treatment of hyper/hypoglycemia, monitoring blood sugars, taking medications as prescribed, benefits of exercising 30 minutes per day and prevention of complications of DM.  Lifestyle Medicine  - Whole Food, Plant Predominant Nutrition is highly recommended: Eat Plenty of vegetables, Mushrooms, fruits, Legumes, Whole Grains, Nuts, seeds in lieu of processed meats, processed snacks/pastries red meat, poultry, eggs.    -It is better to avoid simple carbohydrates including: Cakes, Sweet Desserts, Ice Cream, Soda (diet and regular), Sweet Tea, Candies, Chips, Cookies, Store Bought Juices, Alcohol in Excess of  1-2 drinks a day, Lemonade,  Artificial  Sweeteners, Doughnuts, Coffee Creamers, "Sugar-free" Products, etc, etc.  This is not a complete list.....  Exercise: If you are able: 30 -60 minutes a day ,4 days a week, or 150 minutes a week.  The longer the better.  Combine stretch, strength, and aerobic activities.  If you were told in the past that you have high risk for cardiovascular diseases, you may seek evaluation by your heart doctor prior to initiating moderate to intense exercise programs.   Handouts Provided Include  Lifestyle Medicine handouts   Learning Style & Readiness for Change Teaching method utilized: Visual & Auditory  Demonstrated degree of understanding via: Teach Back  Barriers to learning/adherence to lifestyle change: none  Goals Established by Pt Goals  Cut out bologna and cheese and choose more fruits, dried beans, vegetables and whole grains. Drink only water Walk 15 minutes a day Get A1C to 6.5% or below. Lose 1 lb per week.   MONITORING & EVALUATION Dietary intake, weekly physical activity, and weight  in 1 month.  Next Steps  Patient is to work on eating more whole plant based foods and cut out processed foods.[name]

## 2022-10-03 ENCOUNTER — Encounter: Payer: Self-pay | Admitting: Nutrition

## 2022-10-05 ENCOUNTER — Other Ambulatory Visit: Payer: Self-pay

## 2022-10-05 ENCOUNTER — Other Ambulatory Visit (HOSPITAL_COMMUNITY): Payer: Self-pay

## 2022-10-06 ENCOUNTER — Other Ambulatory Visit: Payer: Self-pay

## 2022-10-26 ENCOUNTER — Other Ambulatory Visit: Payer: Self-pay

## 2022-10-26 ENCOUNTER — Other Ambulatory Visit (HOSPITAL_COMMUNITY): Payer: Self-pay

## 2022-10-27 ENCOUNTER — Ambulatory Visit: Payer: Medicaid Other | Admitting: "Endocrinology

## 2022-10-27 ENCOUNTER — Other Ambulatory Visit: Payer: Self-pay

## 2022-11-03 ENCOUNTER — Other Ambulatory Visit (HOSPITAL_COMMUNITY): Payer: Self-pay

## 2022-11-03 ENCOUNTER — Other Ambulatory Visit: Payer: Self-pay

## 2022-11-08 ENCOUNTER — Ambulatory Visit: Payer: Medicaid Other | Admitting: Nutrition

## 2022-11-15 ENCOUNTER — Emergency Department (HOSPITAL_COMMUNITY): Payer: Medicaid Other

## 2022-11-15 ENCOUNTER — Other Ambulatory Visit: Payer: Self-pay

## 2022-11-15 ENCOUNTER — Ambulatory Visit: Payer: Medicaid Other | Admitting: Nurse Practitioner

## 2022-11-15 ENCOUNTER — Encounter (HOSPITAL_COMMUNITY): Payer: Self-pay

## 2022-11-15 ENCOUNTER — Other Ambulatory Visit (HOSPITAL_COMMUNITY): Payer: Self-pay

## 2022-11-15 ENCOUNTER — Inpatient Hospital Stay (HOSPITAL_COMMUNITY)
Admission: EM | Admit: 2022-11-15 | Discharge: 2022-11-19 | DRG: 660 | Disposition: A | Discharge status: Home / Self Care | Payer: Medicaid Other | Attending: Internal Medicine | Admitting: Internal Medicine

## 2022-11-15 DIAGNOSIS — N201 Calculus of ureter: Principal | ICD-10-CM | POA: Diagnosis present

## 2022-11-15 DIAGNOSIS — Q638 Other specified congenital malformations of kidney: Secondary | ICD-10-CM

## 2022-11-15 DIAGNOSIS — I152 Hypertension secondary to endocrine disorders: Secondary | ICD-10-CM | POA: Diagnosis present

## 2022-11-15 DIAGNOSIS — E1159 Type 2 diabetes mellitus with other circulatory complications: Secondary | ICD-10-CM | POA: Diagnosis present

## 2022-11-15 DIAGNOSIS — Z7984 Long term (current) use of oral hypoglycemic drugs: Secondary | ICD-10-CM

## 2022-11-15 DIAGNOSIS — I252 Old myocardial infarction: Secondary | ICD-10-CM

## 2022-11-15 DIAGNOSIS — Z87442 Personal history of urinary calculi: Secondary | ICD-10-CM

## 2022-11-15 DIAGNOSIS — Z87440 Personal history of urinary (tract) infections: Secondary | ICD-10-CM

## 2022-11-15 DIAGNOSIS — Z794 Long term (current) use of insulin: Secondary | ICD-10-CM

## 2022-11-15 DIAGNOSIS — Z79899 Other long term (current) drug therapy: Secondary | ICD-10-CM

## 2022-11-15 DIAGNOSIS — E872 Acidosis, unspecified: Secondary | ICD-10-CM | POA: Diagnosis present

## 2022-11-15 DIAGNOSIS — Z7982 Long term (current) use of aspirin: Secondary | ICD-10-CM

## 2022-11-15 DIAGNOSIS — N136 Pyonephrosis: Principal | ICD-10-CM | POA: Diagnosis present

## 2022-11-15 DIAGNOSIS — Z833 Family history of diabetes mellitus: Secondary | ICD-10-CM

## 2022-11-15 DIAGNOSIS — N400 Enlarged prostate without lower urinary tract symptoms: Secondary | ICD-10-CM | POA: Diagnosis present

## 2022-11-15 DIAGNOSIS — E1169 Type 2 diabetes mellitus with other specified complication: Secondary | ICD-10-CM | POA: Diagnosis present

## 2022-11-15 DIAGNOSIS — F17210 Nicotine dependence, cigarettes, uncomplicated: Secondary | ICD-10-CM | POA: Diagnosis present

## 2022-11-15 DIAGNOSIS — Z1152 Encounter for screening for COVID-19: Secondary | ICD-10-CM

## 2022-11-15 DIAGNOSIS — I2510 Atherosclerotic heart disease of native coronary artery without angina pectoris: Secondary | ICD-10-CM | POA: Diagnosis present

## 2022-11-15 DIAGNOSIS — N319 Neuromuscular dysfunction of bladder, unspecified: Secondary | ICD-10-CM | POA: Diagnosis present

## 2022-11-15 DIAGNOSIS — Z7985 Long-term (current) use of injectable non-insulin antidiabetic drugs: Secondary | ICD-10-CM

## 2022-11-15 DIAGNOSIS — E1165 Type 2 diabetes mellitus with hyperglycemia: Secondary | ICD-10-CM | POA: Diagnosis present

## 2022-11-15 DIAGNOSIS — Z8249 Family history of ischemic heart disease and other diseases of the circulatory system: Secondary | ICD-10-CM

## 2022-11-15 DIAGNOSIS — E782 Mixed hyperlipidemia: Secondary | ICD-10-CM | POA: Diagnosis present

## 2022-11-15 DIAGNOSIS — I69398 Other sequelae of cerebral infarction: Secondary | ICD-10-CM

## 2022-11-15 DIAGNOSIS — F17290 Nicotine dependence, other tobacco product, uncomplicated: Secondary | ICD-10-CM | POA: Diagnosis present

## 2022-11-15 DIAGNOSIS — Z955 Presence of coronary angioplasty implant and graft: Secondary | ICD-10-CM

## 2022-11-15 DIAGNOSIS — Z6832 Body mass index (BMI) 32.0-32.9, adult: Secondary | ICD-10-CM

## 2022-11-15 DIAGNOSIS — A419 Sepsis, unspecified organism: Secondary | ICD-10-CM

## 2022-11-15 DIAGNOSIS — N210 Calculus in bladder: Secondary | ICD-10-CM | POA: Diagnosis present

## 2022-11-15 DIAGNOSIS — Z8546 Personal history of malignant neoplasm of prostate: Secondary | ICD-10-CM

## 2022-11-15 DIAGNOSIS — Z888 Allergy status to other drugs, medicaments and biological substances status: Secondary | ICD-10-CM

## 2022-11-15 DIAGNOSIS — Z800 Family history of malignant neoplasm of digestive organs: Secondary | ICD-10-CM

## 2022-11-15 DIAGNOSIS — R7881 Bacteremia: Secondary | ICD-10-CM | POA: Diagnosis present

## 2022-11-15 DIAGNOSIS — E669 Obesity, unspecified: Secondary | ICD-10-CM | POA: Diagnosis present

## 2022-11-15 DIAGNOSIS — B957 Other staphylococcus as the cause of diseases classified elsewhere: Secondary | ICD-10-CM | POA: Insufficient documentation

## 2022-11-15 LAB — COMPREHENSIVE METABOLIC PANEL
ALT: 49 U/L — ABNORMAL HIGH (ref 0–44)
AST: 38 U/L (ref 15–41)
Albumin: 3.4 g/dL — ABNORMAL LOW (ref 3.5–5.0)
Alkaline Phosphatase: 91 U/L (ref 38–126)
Anion gap: 10 (ref 5–15)
BUN: 21 mg/dL (ref 8–23)
CO2: 19 mmol/L — ABNORMAL LOW (ref 22–32)
Calcium: 8.8 mg/dL — ABNORMAL LOW (ref 8.9–10.3)
Chloride: 109 mmol/L (ref 98–111)
Creatinine, Ser: 1.06 mg/dL (ref 0.61–1.24)
GFR, Estimated: >60 mL/min (ref >60)
Glucose, Bld: 142 mg/dL — ABNORMAL HIGH (ref 70–99)
Potassium: 3.7 mmol/L (ref 3.5–5.1)
Sodium: 138 mmol/L (ref 135–145)
Total Bilirubin: 0.5 mg/dL (ref 0.3–1.2)
Total Protein: 7.4 g/dL (ref 6.5–8.1)

## 2022-11-15 LAB — APTT: aPTT: <20 seconds — ABNORMAL LOW (ref 24–36)

## 2022-11-15 LAB — CBC WITH DIFFERENTIAL/PLATELET
Abs Immature Granulocytes: 0.02 10*3/uL (ref 0.00–0.07)
Basophils Absolute: 0 10*3/uL (ref 0.0–0.1)
Basophils Relative: 1 %
Eosinophils Absolute: 0 10*3/uL (ref 0.0–0.5)
Eosinophils Relative: 1 %
HCT: 40.9 % (ref 39.0–52.0)
Hemoglobin: 13.6 g/dL (ref 13.0–17.0)
Immature Granulocytes: 0 %
Lymphocytes Relative: 8 %
Lymphs Abs: 0.4 10*3/uL — ABNORMAL LOW (ref 0.7–4.0)
MCH: 31.9 pg (ref 26.0–34.0)
MCHC: 33.3 g/dL (ref 30.0–36.0)
MCV: 95.8 fL (ref 80.0–100.0)
Monocytes Absolute: 0.1 10*3/uL (ref 0.1–1.0)
Monocytes Relative: 1 %
Neutro Abs: 4.5 10*3/uL (ref 1.7–7.7)
Neutrophils Relative %: 89 %
Platelets: 153 10*3/uL (ref 150–400)
RBC: 4.27 MIL/uL (ref 4.22–5.81)
RDW: 13.5 % (ref 11.5–15.5)
WBC: 5.1 10*3/uL (ref 4.0–10.5)
nRBC: 0 % (ref 0.0–0.2)

## 2022-11-15 LAB — PROTIME-INR
INR: 1.1 (ref 0.8–1.2)
Prothrombin Time: 14.3 seconds (ref 11.4–15.2)

## 2022-11-15 LAB — I-STAT CG4 LACTIC ACID, ED: Lactic Acid, Venous: 2.8 mmol/L (ref 0.5–1.9)

## 2022-11-15 MED ORDER — LACTATED RINGERS IV BOLUS (SEPSIS)
500.0000 mL | Freq: Once | INTRAVENOUS | Status: AC
  Administered 2022-11-16: 500 mL via INTRAVENOUS

## 2022-11-15 MED ORDER — SODIUM CHLORIDE 0.9 % IV SOLN
1.0000 g | Freq: Once | INTRAVENOUS | Status: AC
  Administered 2022-11-15: 1 g via INTRAVENOUS
  Filled 2022-11-15: qty 10

## 2022-11-15 MED ORDER — LACTATED RINGERS IV BOLUS (SEPSIS)
1000.0000 mL | Freq: Once | INTRAVENOUS | Status: AC
  Administered 2022-11-16: 1000 mL via INTRAVENOUS

## 2022-11-15 MED ORDER — MORPHINE SULFATE (PF) 2 MG/ML IV SOLN
2.0000 mg | Freq: Once | INTRAVENOUS | Status: AC
  Administered 2022-11-15: 2 mg via INTRAVENOUS
  Filled 2022-11-15: qty 1

## 2022-11-15 MED ORDER — SODIUM CHLORIDE 0.9 % IV SOLN: 2.0000 g | Freq: Once | INTRAVENOUS | Status: DC

## 2022-11-15 MED ORDER — SODIUM CHLORIDE 0.9 % IV BOLUS
1000.0000 mL | Freq: Once | INTRAVENOUS | Status: AC
  Administered 2022-11-15: 1000 mL via INTRAVENOUS

## 2022-11-15 MED ORDER — IOHEXOL 350 MG/ML SOLN
75.0000 mL | Freq: Once | INTRAVENOUS | Status: AC | PRN
  Administered 2022-11-15: 75 mL via INTRAVENOUS

## 2022-11-15 MED ORDER — ACETAMINOPHEN 650 MG RE SUPP
650.0000 mg | Freq: Once | RECTAL | Status: AC
  Administered 2022-11-16: 650 mg via RECTAL
  Filled 2022-11-15: qty 1

## 2022-11-15 MED ORDER — LACTATED RINGERS IV SOLN: INTRAVENOUS | Status: AC

## 2022-11-15 MED ORDER — METRONIDAZOLE 500 MG/100ML IV SOLN
500.0000 mg | Freq: Once | INTRAVENOUS | Status: AC
  Administered 2022-11-16: 500 mg via INTRAVENOUS
  Filled 2022-11-15: qty 100

## 2022-11-15 MED ORDER — LACTATED RINGERS IV BOLUS (SEPSIS): 1000.0000 mL | Freq: Once | INTRAVENOUS | Status: DC

## 2022-11-15 MED ORDER — ONDANSETRON HCL 4 MG/2ML IJ SOLN
4.0000 mg | Freq: Once | INTRAMUSCULAR | Status: AC
  Administered 2022-11-15: 4 mg via INTRAVENOUS
  Filled 2022-11-15: qty 2

## 2022-11-15 MED ORDER — VANCOMYCIN HCL 10 G IV SOLR
2500.0000 mg | INTRAVENOUS | Status: AC
  Administered 2022-11-16: 2500 mg via INTRAVENOUS
  Filled 2022-11-15: qty 25

## 2022-11-15 NOTE — ED Provider Notes (Signed)
[place] EMERGENCY DEPARTMENT AT [facility] Provider Note   CSN: [id number] Arrival date & time: 11/15/22  2143     History {Add pertinent medical, surgical, social history, OB history to HPI:1} Chief Complaint  Patient presents with   Flank Pain    Shane Bond is a 62 y.o. male.   Flank Pain       Home Medications Prior to Admission medications   Medication Sig Start Date End Date Taking? Authorizing Provider  Accu-Chek Softclix Lancets lancets Use as instructed to check blood glucose level by fingerstick twice per day. 08/15/22   [name], NP  Aspirin 81 MG CAPS Take 1 tablet by mouth daily.    [provider]  Blood Glucose Monitoring Suppl (ACCU-CHEK GUIDE) w/Device KIT Use as instructed to check blood glucose level by fingerstick twice per day Patient not taking: Reported on 08/15/2022 04/26/21   [name], NP  dapagliflozin propanediol (FARXIGA) 10 MG TABS tablet Take 1 tablet (10 mg total) by mouth daily before breakfast. 05/13/22   [name], NP  ezetimibe (ZETIA) 10 MG tablet Take 1 tablet (10 mg total) by mouth daily. 05/14/22   [name], NP  glucose blood (ACCU-CHEK GUIDE) test strip Use as instructed to check blood glucose by fingerstick twice per day. 08/15/22   [name], NP  insulin glargine (LANTUS SOLOSTAR) 100 UNIT/ML Solostar Pen Inject 40 Units into the skin 2 (two) times daily. 09/21/22   Hoy Register, MD  Insulin Pen Needle (TECHLITE PEN NEEDLES) 32G X 4 MM MISC Inject as directed 2 (two) times daily. 09/06/22   Hoy Register, MD  losartan (COZAAR) 25 MG tablet Take 1 tablet (25 mg total) by mouth daily. 09/06/22   Hoy Register, MD  Misc. Devices (CAREX COCCYX CUSHION) MISC Use daily when sitting for pressure relief ICD10 L98.429 07/02/20   [name], NP  Misc. Devices MISC Please provide patient with standard sized 31F urinary catheters for self cathing along with lubricant. 05/29/18    [name], NP  Multiple Vitamin (MULTIVITAMIN ADULT PO) Take 1 tablet by mouth daily.    [provider]  omega-3 acid ethyl esters (LOVAZA) 1 g capsule Take 2 capsules (2 g total) by mouth 2 (two) times daily. Please fill as a 90 day supply 08/15/22   [name], NP  phenazopyridine (PYRIDIUM) 100 MG tablet Take 1 tablet (100 mg total) by mouth 3 (three) times daily as needed (urinary pain). 09/12/22   Zenia Resides, MD  Semaglutide,0.25 or 0.5MG /DOS, (OZEMPIC, 0.25 OR 0.5 MG/DOSE,) 2 MG/3ML SOPN Inject 0.5 mg into the skin once a week. 08/15/22   [name], NP  triamcinolone cream (KENALOG) 0.1 % Apply 1 Application topically 2 (two) times daily. Apply to both hands 08/15/22   [name], NP  gemfibrozil (LOPID) 600 MG tablet Take 1 tablet (600 mg total) by mouth 2 (two) times daily before a meal. 07/20/19 11/04/19  [name], NP      Allergies    Lipitor [atorvastatin] and Lisinopril    Review of Systems   Review of Systems  Genitourinary:  Positive for flank pain.    Physical Exam Updated Vital Signs BP (!) 104/53   Pulse 87   Temp (!) 103 F (39.4 C) (Oral)   Resp (!) 27   Ht 6\' 1"  (1.854 m)   Wt 110.2 kg   SpO2 93%   BMI 32.06 kg/m  Physical Exam  ED Results / Procedures / Treatments   Labs (all labs ordered are listed, but only abnormal results are displayed) Labs Reviewed  COMPREHENSIVE METABOLIC PANEL - Abnormal; Notable for the following components:      Result Value   CO2 19 (*)    Glucose, Bld 142 (*)    Calcium 8.8 (*)    Albumin 3.4 (*)    ALT 49 (*)    All other components within normal limits  CBC WITH DIFFERENTIAL/PLATELET - Abnormal; Notable for the following components:   Lymphs Abs 0.4 (*)    All other components within normal limits  I-STAT CG4 LACTIC ACID, ED - Abnormal; Notable for the following components:   Lactic Acid, Venous 2.8 (*)    All other components within normal limits  CULTURE, BLOOD (ROUTINE  X 2)  CULTURE, BLOOD (ROUTINE X 2)  RESP PANEL BY RT-PCR (RSV, FLU A&B, COVID)  RVPGX2  PROTIME-INR  APTT  URINALYSIS, W/ REFLEX TO CULTURE (INFECTION SUSPECTED)  I-STAT CG4 LACTIC ACID, ED    EKG None  Radiology DG Chest 2 View  Result Date: 11/15/2022 CLINICAL DATA:  Left flank pain possible sepsis EXAM: CHEST - 2 VIEW COMPARISON:  06/23/2019 FINDINGS: No acute airspace disease or pleural effusion. Normal cardiomediastinal silhouette. No pneumothorax. Air distended bowel in the upper abdomen. IMPRESSION: No active cardiopulmonary disease. Air distended bowel in the upper abdomen. Electronically Signed   By: Jasmine Pang M.D.   On: 11/15/2022 22:57    Procedures Procedures  {Document cardiac monitor, telemetry assessment procedure when appropriate:1}  Medications Ordered in ED Medications  lactated ringers infusion (has no administration in time range)  metroNIDAZOLE (FLAGYL) IVPB 500 mg (has no administration in time range)  lactated ringers bolus 1,000 mL (has no administration in time range)    And  lactated ringers bolus 1,000 mL (has no administration in time range)    And  lactated ringers bolus 1,000 mL (has no administration in time range)    And  lactated ringers bolus 500 mL (has no administration in time range)  vancomycin (VANCOCIN) 2,500 mg in sodium chloride 0.9 % 500 mL IVPB (has no administration in time range)  acetaminophen (TYLENOL) suppository 650 mg (has no administration in time range)  iohexol (OMNIPAQUE) 350 MG/ML injection 75 mL (has no administration in time range)  cefTRIAXone (ROCEPHIN) 1 g in sodium chloride 0.9 % 100 mL IVPB (0 g Intravenous Stopped 11/15/22 2337)  morphine (PF) 2 MG/ML injection 2 mg (2 mg Intravenous Given 11/15/22 2251)  ondansetron (ZOFRAN) injection 4 mg (4 mg Intravenous Given 11/15/22 2251)  sodium chloride 0.9 % bolus 1,000 mL (1,000 mLs Intravenous New Bag/Given 11/15/22 2339)    ED Course/ Medical Decision Making/ A&P    {   Click here for ABCD2, HEART and other calculatorsREFRESH Note before signing :1}                              Medical Decision Making Amount and/or Complexity of Data Reviewed Labs: ordered. Radiology: ordered. ECG/medicine tests: ordered.  Risk OTC drugs. Prescription drug management.   62 y.o. male presents to the ER for evaluation of ***. Differential diagnosis includes but is not limited to ***. Vital signs ***. Physical exam as noted above.   I independently reviewed and interpreted the patient's labs. ***.     ***We discussed the results of the labs/imaging. The plan is ***. We discussed  strict return precautions and red flag symptoms. The patient verbalized their understanding and agrees to the plan. The patient is stable and being discharged home in good condition.  ***Portions of this report may have been transcribed using voice recognition software. Every effort was made to ensure accuracy; however, inadvertent computerized transcription errors may be present.   Final Clinical Impression(s) / ED Diagnoses Final diagnoses:  None    Rx / DC Orders ED Discharge Orders     None

## 2022-11-15 NOTE — ED Triage Notes (Signed)
Pt complaining of left flank pain that started around 4 pm, hx of kidney stones and bladder infections. Said he vomited once and feels a little better now.

## 2022-11-15 NOTE — Progress Notes (Signed)
ED Pharmacy Antibiotic Sign Off An antibiotic consult was received from an ED provider for vancomycin and cefepime per pharmacy dosing for sepsis. A chart review was completed to assess appropriateness.   The following one time order(s) were placed:  Cefepime 2g IV x1 Vancomycin 2500 mg IV x1   Further antibiotic and/or antibiotic pharmacy consults should be ordered by the admitting provider if indicated.   Thank you for allowing pharmacy to be a part of this patient's care.   Calton Dach, PharmD, BCCCP Clinical Pharmacist 11/15/2022 10:58 PM

## 2022-11-15 NOTE — ED Notes (Signed)
Pt transported CT

## 2022-11-15 NOTE — ED Provider Triage Note (Signed)
Emergency Medicine Provider Triage Evaluation Note  Shane Bond , a 62 y.o. male  was evaluated in triage.  Pt complains of flank pain found to be febrile here with history of kidney stones.  This came on abruptly this evening.  Patient does report nausea and vomiting..  Review of Systems  Positive: Nausea, vomiting, fever, flank pain Negative: Chest pain  Physical Exam  BP 110/71   Pulse (!) 111   Temp (!) 103 F (39.4 C) (Oral)   Resp 18   Ht 6\' 1"  (1.854 m)   Wt 110.2 kg   SpO2 95%   BMI 32.06 kg/m  Gen:   Awake, pale appearing Resp:  Normal effort  MSK:   Moves extremities without difficulty  Other:  R CVA tenderness  Medical Decision Making  Medically screening exam initiated at 10:15 PM.  Appropriate orders placed.  Shane Bond was informed that the remainder of the evaluation will be completed by another provider, this initial triage assessment does not replace that evaluation, and the importance of remaining in the ED until their evaluation is complete.  The patient is ordered antibiotics and sepsis order set is initiated.  He is ordered 1 L of IV fluids to start as well as some pain medication.   Shane Glaze, MD 11/15/22 [phone]

## 2022-11-16 ENCOUNTER — Encounter (HOSPITAL_COMMUNITY): Payer: Self-pay | Admitting: Anesthesiology

## 2022-11-16 ENCOUNTER — Emergency Department (HOSPITAL_COMMUNITY): Payer: Medicaid Other

## 2022-11-16 ENCOUNTER — Emergency Department (HOSPITAL_COMMUNITY): Payer: Medicaid Other | Admitting: Anesthesiology

## 2022-11-16 ENCOUNTER — Encounter (HOSPITAL_COMMUNITY)
Admission: EM | Disposition: A | Discharge status: Home / Self Care | Payer: Self-pay | Source: Home / Self Care | Attending: Internal Medicine

## 2022-11-16 DIAGNOSIS — E782 Mixed hyperlipidemia: Secondary | ICD-10-CM | POA: Diagnosis present

## 2022-11-16 DIAGNOSIS — I10 Essential (primary) hypertension: Secondary | ICD-10-CM | POA: Diagnosis not present

## 2022-11-16 DIAGNOSIS — Z794 Long term (current) use of insulin: Secondary | ICD-10-CM | POA: Diagnosis not present

## 2022-11-16 DIAGNOSIS — Z1152 Encounter for screening for COVID-19: Secondary | ICD-10-CM | POA: Diagnosis not present

## 2022-11-16 DIAGNOSIS — F17210 Nicotine dependence, cigarettes, uncomplicated: Secondary | ICD-10-CM | POA: Diagnosis present

## 2022-11-16 DIAGNOSIS — I219 Acute myocardial infarction, unspecified: Secondary | ICD-10-CM

## 2022-11-16 DIAGNOSIS — I69398 Other sequelae of cerebral infarction: Secondary | ICD-10-CM | POA: Diagnosis not present

## 2022-11-16 DIAGNOSIS — E669 Obesity, unspecified: Secondary | ICD-10-CM | POA: Diagnosis present

## 2022-11-16 DIAGNOSIS — E1159 Type 2 diabetes mellitus with other circulatory complications: Secondary | ICD-10-CM | POA: Diagnosis present

## 2022-11-16 DIAGNOSIS — N210 Calculus in bladder: Secondary | ICD-10-CM | POA: Diagnosis present

## 2022-11-16 DIAGNOSIS — I252 Old myocardial infarction: Secondary | ICD-10-CM | POA: Diagnosis not present

## 2022-11-16 DIAGNOSIS — Z79899 Other long term (current) drug therapy: Secondary | ICD-10-CM | POA: Diagnosis not present

## 2022-11-16 DIAGNOSIS — N201 Calculus of ureter: Secondary | ICD-10-CM | POA: Diagnosis present

## 2022-11-16 DIAGNOSIS — B957 Other staphylococcus as the cause of diseases classified elsewhere: Secondary | ICD-10-CM | POA: Diagnosis present

## 2022-11-16 DIAGNOSIS — I152 Hypertension secondary to endocrine disorders: Secondary | ICD-10-CM | POA: Diagnosis present

## 2022-11-16 DIAGNOSIS — E1165 Type 2 diabetes mellitus with hyperglycemia: Secondary | ICD-10-CM | POA: Diagnosis present

## 2022-11-16 DIAGNOSIS — I2510 Atherosclerotic heart disease of native coronary artery without angina pectoris: Secondary | ICD-10-CM | POA: Diagnosis present

## 2022-11-16 DIAGNOSIS — N136 Pyonephrosis: Secondary | ICD-10-CM | POA: Diagnosis present

## 2022-11-16 DIAGNOSIS — N400 Enlarged prostate without lower urinary tract symptoms: Secondary | ICD-10-CM | POA: Diagnosis present

## 2022-11-16 DIAGNOSIS — Z888 Allergy status to other drugs, medicaments and biological substances status: Secondary | ICD-10-CM | POA: Diagnosis not present

## 2022-11-16 DIAGNOSIS — E872 Acidosis, unspecified: Secondary | ICD-10-CM | POA: Diagnosis present

## 2022-11-16 DIAGNOSIS — Z87440 Personal history of urinary (tract) infections: Secondary | ICD-10-CM | POA: Diagnosis not present

## 2022-11-16 DIAGNOSIS — B958 Unspecified staphylococcus as the cause of diseases classified elsewhere: Secondary | ICD-10-CM | POA: Diagnosis not present

## 2022-11-16 DIAGNOSIS — R7881 Bacteremia: Secondary | ICD-10-CM | POA: Diagnosis present

## 2022-11-16 DIAGNOSIS — Q638 Other specified congenital malformations of kidney: Secondary | ICD-10-CM | POA: Diagnosis not present

## 2022-11-16 DIAGNOSIS — E1169 Type 2 diabetes mellitus with other specified complication: Secondary | ICD-10-CM | POA: Diagnosis present

## 2022-11-16 DIAGNOSIS — N319 Neuromuscular dysfunction of bladder, unspecified: Secondary | ICD-10-CM | POA: Diagnosis present

## 2022-11-16 DIAGNOSIS — F17290 Nicotine dependence, other tobacco product, uncomplicated: Secondary | ICD-10-CM | POA: Diagnosis present

## 2022-11-16 HISTORY — PX: CYSTOSCOPY W/ URETERAL STENT PLACEMENT: SHX1429

## 2022-11-16 LAB — URINALYSIS, W/ REFLEX TO CULTURE (INFECTION SUSPECTED)
Bilirubin Urine: NEGATIVE
Glucose, UA: >=500 mg/dL — AB
Ketones, ur: NEGATIVE mg/dL
Leukocytes,Ua: NEGATIVE
Nitrite: NEGATIVE
Protein, ur: NEGATIVE mg/dL
Specific Gravity, Urine: >1.046 — ABNORMAL HIGH (ref 1.005–1.030)
pH: 5 (ref 5.0–8.0)

## 2022-11-16 LAB — GLUCOSE, CAPILLARY
Glucose-Capillary: 119 mg/dL — ABNORMAL HIGH (ref 70–99)
Glucose-Capillary: 129 mg/dL — ABNORMAL HIGH (ref 70–99)
Glucose-Capillary: 175 mg/dL — ABNORMAL HIGH (ref 70–99)
Glucose-Capillary: 182 mg/dL — ABNORMAL HIGH (ref 70–99)
Glucose-Capillary: 166 mg/dL — ABNORMAL HIGH (ref 70–99)
Glucose-Capillary: 199 mg/dL — ABNORMAL HIGH (ref 70–99)
Glucose-Capillary: 255 mg/dL — ABNORMAL HIGH (ref 70–99)
Glucose-Capillary: 199 mg/dL — ABNORMAL HIGH (ref 70–99)

## 2022-11-16 LAB — BLOOD CULTURE ID PANEL (REFLEXED) - BCID2

## 2022-11-16 LAB — RESP PANEL BY RT-PCR (RSV, FLU A&B, COVID)  RVPGX2
Influenza A by PCR: NEGATIVE
Influenza B by PCR: NEGATIVE
Resp Syncytial Virus by PCR: NEGATIVE
SARS Coronavirus 2 by RT PCR: NEGATIVE

## 2022-11-16 LAB — LACTIC ACID, PLASMA: Lactic Acid, Venous: 1.2 mmol/L (ref 0.5–1.9)

## 2022-11-16 LAB — PROTIME-INR
INR: 1.1 (ref 0.8–1.2)
Prothrombin Time: 14.9 seconds (ref 11.4–15.2)

## 2022-11-16 LAB — APTT: aPTT: <20 seconds — ABNORMAL LOW (ref 24–36)

## 2022-11-16 SURGERY — CYSTOSCOPY, WITH RETROGRADE PYELOGRAM AND URETERAL STENT INSERTION
Anesthesia: General | Site: Ureter | Laterality: Left

## 2022-11-16 MED ORDER — FENTANYL CITRATE (PF) 250 MCG/5ML IJ SOLN
INTRAMUSCULAR | Status: AC
  Filled 2022-11-16: qty 5

## 2022-11-16 MED ORDER — ROCURONIUM BROMIDE 10 MG/ML (PF) SYRINGE
PREFILLED_SYRINGE | INTRAVENOUS | Status: AC
  Filled 2022-11-16: qty 10

## 2022-11-16 MED ORDER — INSULIN ASPART 100 UNIT/ML IJ SOLN
0.0000 [IU] | INTRAMUSCULAR | Status: DC
  Administered 2022-11-16: 5 [IU] via SUBCUTANEOUS
  Administered 2022-11-16 – 2022-11-17 (×5): 2 [IU] via SUBCUTANEOUS
  Administered 2022-11-17: 7 [IU] via SUBCUTANEOUS

## 2022-11-16 MED ORDER — STERILE WATER FOR IRRIGATION IR SOLN
Status: DC | PRN
Start: 2022-11-16 — End: 2022-11-16
  Administered 2022-11-16: 3000 mL

## 2022-11-16 MED ORDER — DEXAMETHASONE SODIUM PHOSPHATE 10 MG/ML IJ SOLN
INTRAMUSCULAR | Status: AC
  Filled 2022-11-16: qty 1

## 2022-11-16 MED ORDER — FENTANYL CITRATE (PF) 100 MCG/2ML IJ SOLN: 25.0000 ug | INTRAMUSCULAR | Status: DC | PRN

## 2022-11-16 MED ORDER — ACETAMINOPHEN 325 MG PO TABS: 325.0000 mg | ORAL_TABLET | ORAL | Status: DC | PRN

## 2022-11-16 MED ORDER — OXYCODONE HCL 5 MG/5ML PO SOLN: 5.0000 mg | Freq: Once | ORAL | Status: DC | PRN

## 2022-11-16 MED ORDER — NOREPINEPHRINE 4 MG/250ML-% IV SOLN: 2.0000 ug/min | INTRAVENOUS | Status: DC

## 2022-11-16 MED ORDER — METRONIDAZOLE 500 MG/100ML IV SOLN
500.0000 mg | Freq: Two times a day (BID) | INTRAVENOUS | Status: DC
  Administered 2022-11-16 – 2022-11-17 (×3): 500 mg via INTRAVENOUS
  Filled 2022-11-16 (×3): qty 100

## 2022-11-16 MED ORDER — LACTATED RINGERS IV BOLUS: 1000.0000 mL | Freq: Once | INTRAVENOUS | Status: DC

## 2022-11-16 MED ORDER — HEPARIN SODIUM (PORCINE) 5000 UNIT/ML IJ SOLN
5000.0000 [IU] | Freq: Three times a day (TID) | INTRAMUSCULAR | Status: DC
  Administered 2022-11-17 – 2022-11-19 (×7): 5000 [IU] via SUBCUTANEOUS
  Filled 2022-11-16 (×6): qty 1

## 2022-11-16 MED ORDER — SUCCINYLCHOLINE 20MG/ML (10ML) SYRINGE FOR MEDFUSION PUMP - OPTIME
INTRAMUSCULAR | Status: DC | PRN
  Administered 2022-11-16: 140 mg via INTRAVENOUS

## 2022-11-16 MED ORDER — ALBUMIN HUMAN 5 % IV SOLN
INTRAVENOUS | Status: AC
  Filled 2022-11-16: qty 250

## 2022-11-16 MED ORDER — ROCURONIUM BROMIDE 10 MG/ML (PF) SYRINGE
PREFILLED_SYRINGE | INTRAVENOUS | Status: DC | PRN
  Administered 2022-11-16: 25 mg via INTRAVENOUS

## 2022-11-16 MED ORDER — ONDANSETRON HCL 4 MG/2ML IJ SOLN
INTRAMUSCULAR | Status: AC
  Filled 2022-11-16: qty 2

## 2022-11-16 MED ORDER — ACETAMINOPHEN 160 MG/5ML PO SOLN: 325.0000 mg | ORAL | Status: DC | PRN

## 2022-11-16 MED ORDER — PROPOFOL 10 MG/ML IV BOLUS
INTRAVENOUS | Status: AC
  Filled 2022-11-16: qty 20

## 2022-11-16 MED ORDER — IOHEXOL 300 MG/ML  SOLN
INTRAMUSCULAR | Status: DC | PRN
  Administered 2022-11-16: 300 mg via URETHRAL

## 2022-11-16 MED ORDER — SODIUM CHLORIDE 0.9 % IV SOLN: 1.0000 g | INTRAVENOUS | Status: DC

## 2022-11-16 MED ORDER — FENTANYL CITRATE (PF) 250 MCG/5ML IJ SOLN
INTRAMUSCULAR | Status: DC | PRN
  Administered 2022-11-16 (×2): 50 ug via INTRAVENOUS

## 2022-11-16 MED ORDER — CEFAZOLIN SODIUM-DEXTROSE 2-4 GM/100ML-% IV SOLN
2.0000 g | Freq: Three times a day (TID) | INTRAVENOUS | Status: DC
  Administered 2022-11-16 – 2022-11-19 (×8): 2 g via INTRAVENOUS
  Filled 2022-11-16 (×8): qty 100

## 2022-11-16 MED ORDER — MIDAZOLAM HCL 2 MG/2ML IJ SOLN
INTRAMUSCULAR | Status: AC
  Filled 2022-11-16: qty 2

## 2022-11-16 MED ORDER — KETOROLAC TROMETHAMINE 30 MG/ML IJ SOLN
INTRAMUSCULAR | Status: AC
  Filled 2022-11-16: qty 1

## 2022-11-16 MED ORDER — LIDOCAINE 2% (20 MG/ML) 5 ML SYRINGE
INTRAMUSCULAR | Status: DC | PRN
  Administered 2022-11-16: 50 mg via INTRAVENOUS

## 2022-11-16 MED ORDER — EPHEDRINE SULFATE-NACL 50-0.9 MG/10ML-% IV SOSY
PREFILLED_SYRINGE | INTRAVENOUS | Status: DC | PRN
  Administered 2022-11-16: 2.5 mg via INTRAVENOUS

## 2022-11-16 MED ORDER — OXYCODONE HCL 5 MG PO TABS: 5.0000 mg | ORAL_TABLET | Freq: Once | ORAL | Status: DC | PRN

## 2022-11-16 MED ORDER — PROMETHAZINE HCL 25 MG/ML IJ SOLN: 6.2500 mg | INTRAMUSCULAR | Status: DC | PRN

## 2022-11-16 MED ORDER — DROPERIDOL 2.5 MG/ML IJ SOLN: 0.6250 mg | Freq: Once | INTRAMUSCULAR | Status: DC | PRN

## 2022-11-16 MED ORDER — PROPOFOL 10 MG/ML IV BOLUS
INTRAVENOUS | Status: DC | PRN
  Administered 2022-11-16: 150 mg via INTRAVENOUS

## 2022-11-16 MED ORDER — SODIUM CHLORIDE 0.9 % IV SOLN
250.0000 mL | INTRAVENOUS | Status: DC
  Administered 2022-11-16: 250 mL via INTRAVENOUS

## 2022-11-16 MED ORDER — PHENYLEPHRINE 80 MCG/ML (10ML) SYRINGE FOR IV PUSH (FOR BLOOD PRESSURE SUPPORT)
PREFILLED_SYRINGE | INTRAVENOUS | Status: DC | PRN
  Administered 2022-11-16 (×4): 160 ug via INTRAVENOUS

## 2022-11-16 MED ORDER — KETOROLAC TROMETHAMINE 30 MG/ML IJ SOLN
INTRAMUSCULAR | Status: DC | PRN
Start: 2022-11-16 — End: 2022-11-16
  Administered 2022-11-16: 30 mg via INTRAVENOUS

## 2022-11-16 MED ORDER — DOCUSATE SODIUM 100 MG PO CAPS: 100.0000 mg | ORAL_CAPSULE | Freq: Two times a day (BID) | ORAL | Status: DC | PRN

## 2022-11-16 MED ORDER — ALBUMIN HUMAN 5 % IV SOLN
12.5000 g | Freq: Once | INTRAVENOUS | Status: AC
  Administered 2022-11-16: 12.5 g via INTRAVENOUS

## 2022-11-16 MED ORDER — ACETAMINOPHEN 10 MG/ML IV SOLN: 1000.0000 mg | Freq: Once | INTRAVENOUS | Status: DC | PRN

## 2022-11-16 MED ORDER — SUGAMMADEX SODIUM 200 MG/2ML IV SOLN
INTRAVENOUS | Status: DC | PRN
  Administered 2022-11-16: 200 mg via INTRAVENOUS

## 2022-11-16 MED ORDER — POLYETHYLENE GLYCOL 3350 17 G PO PACK: 17.0000 g | PACK | Freq: Every day | ORAL | Status: DC | PRN

## 2022-11-16 MED ORDER — STERILE WATER FOR IRRIGATION IR SOLN
Status: DC | PRN
Start: 2022-11-16 — End: 2022-11-16
  Administered 2022-11-16: 1000 mL

## 2022-11-16 MED ORDER — ONDANSETRON HCL 4 MG/2ML IJ SOLN
4.0000 mg | Freq: Four times a day (QID) | INTRAMUSCULAR | Status: DC | PRN
  Filled 2022-11-16: qty 2

## 2022-11-16 SURGICAL SUPPLY — 27 items
BAG DRN RND TRDRP ANRFLXCHMBR (UROLOGICAL SUPPLIES) ×1
BAG URINE DRAIN 2000ML AR STRL (UROLOGICAL SUPPLIES) ×1 IMPLANT
BAG URO CATCHER STRL LF (MISCELLANEOUS) ×1 IMPLANT
CATH FOLEY 2WAY SLVR 5CC 16FR (CATHETERS) IMPLANT
CATH URETL OPEN END 6FR 70 (CATHETERS) ×1 IMPLANT
GLOVE BIO SURGEON STRL SZ8 (GLOVE) ×1 IMPLANT
GLOVE BIOGEL PI IND STRL 6.5 (GLOVE) IMPLANT
GLOVE BIOGEL PI IND STRL 8 (GLOVE) IMPLANT
GOWN STRL REUS W/ TWL LRG LVL3 (GOWN DISPOSABLE) ×1 IMPLANT
GOWN STRL REUS W/ TWL XL LVL3 (GOWN DISPOSABLE) ×1 IMPLANT
GOWN STRL REUS W/TWL LRG LVL3 (GOWN DISPOSABLE) ×1
GOWN STRL REUS W/TWL XL LVL3 (GOWN DISPOSABLE) ×1
GOWN STRL SURGICAL XL XLNG (GOWN DISPOSABLE) IMPLANT
GUIDEWIRE ANG ZIPWIRE 038X150 (WIRE) IMPLANT
GUIDEWIRE STR DUAL SENSOR (WIRE) ×1 IMPLANT
KIT TURNOVER KIT B (KITS) ×1 IMPLANT
MANIFOLD NEPTUNE II (INSTRUMENTS) IMPLANT
NS IRRIG 1000ML POUR BTL (IV SOLUTION) IMPLANT
PACK CYSTO (CUSTOM PROCEDURE TRAY) ×1 IMPLANT
SOL PREP POV-IOD 4OZ 10% (MISCELLANEOUS) IMPLANT
STENT URET 6FRX24 CONTOUR (STENTS) IMPLANT
STENT URET 6FRX26 CONTOUR (STENTS) IMPLANT
SYPHON OMNI JUG (MISCELLANEOUS) ×1 IMPLANT
SYR CONTROL 10ML LL (SYRINGE) IMPLANT
TOWEL GREEN STERILE FF (TOWEL DISPOSABLE) ×1 IMPLANT
TUBE CONNECTING 12X1/4 (SUCTIONS) IMPLANT
WATER STERILE IRR 3000ML UROMA (IV SOLUTION) ×1 IMPLANT

## 2022-11-16 NOTE — Op Note (Signed)
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  The lower moiety was the obstructed ureter.  Fortunately I was able to get into the appropriate ureter quickly. #2: The 26 cm time 6 French double-J stent was nicely seated in the left lower pole moiety as noted under fluoroscopic guidance.  EBL: Minimal  Specimens: None  Indication: Shane Bond is a 62 y.o. patient with obstructing left ureteral stone. After reviewing the management options for treatment, he elected to proceed with the above surgical procedure(s). We have discussed the potential benefits and risks of the procedure, side effects of the proposed treatment, the likelihood of the patient achieving the goals of the procedure, and any potential problems that might occur during the procedure or recuperation. Informed consent has been obtained.  Description of procedure:  The patient was taken to the operating room and general anesthesia was induced.  The patient was placed in the dorsal lithotomy position, prepped and draped in the usual sterile fashion, and preoperative antibiotics were administered. A preoperative time-out was performed.   Cystourethroscopy was performed.  The patient's urethra was examined and was normal and demonstrated bilobar prostatic hypertrophy. The bladder was then systematically examined in its entirety. There was no evidence for any bladder tumors, stones, or other mucosal pathology.    Attention then turned to the leftureteral orifice and a ureteral catheter was used to intubate the ureteral orifice.  Omnipaque contrast was injected through the ureteral catheter and a  retrograde pyelogram was performed with findings as dictated above.  A 0.38 sensor guidewire was then advanced up the left ureter into the renal pelvis under fluoroscopic guidance.  The wire was then backloaded through the cystoscope and a ureteral stent was advance over the wire using Seldinger technique.  The stent was positioned appropriately under fluoroscopic and cystoscopic guidance.  The wire was then removed with an adequate stent curl noted in the renal pelvis as well as in the bladder.  The bladder was then emptied and the procedure ended.  The patient appeared to tolerate the procedure well and without complications.  The patient was able to be awakened and transferred to the recovery unit in satisfactory condition.    [name], M.D.

## 2022-11-16 NOTE — H&P (Signed)
I have been asked to see the patient by Dr. Dione Booze, for evaluation and management of left ureteral stone and fever.  History of present illness: 62 year old male with a history of neurogenic bladder from stroke who performs CIC presented to the emergency department with fever, nausea, and left-sided flank pain.  His flank pain began around 12 hours ago.  He had associated nausea and vomiting.  Pain was poorly controlled.  He went to the emergency department.  At in the emergency department he was found to have a left-sided obstructing ureteral stone.  He was found to have small little bladder stones as well as a large nonobstructing stone in the left kidney.  He had mildly elevated lactate.  His labs are otherwise largely unremarkable.  In the ER his blood pressure became somewhat unstable and started to decline.  He was noted to be tachycardic as well.  He was febrile to 103 F.  Review of systems: A 12 point comprehensive review of systems was obtained and is negative unless otherwise stated in the history of present illness.  Patient Active Problem List   Diagnosis Date Noted   Essential hypertension, benign 07/27/2022   Insulin long-term use (HCC) 07/27/2022   Malignant neoplasm of prostate (HCC) 09/03/2019   Hydronephrosis with renal and ureteral calculus obstruction 06/24/2019   Thrombocytopenia (HCC) 06/21/2019   Normocytic anemia 06/21/2019   Bacteremia due to Klebsiella pneumoniae 06/21/2019   Lung infiltrate 06/20/2019   Sepsis due to urinary tract infection (HCC) 06/19/2019   Hyperkalemia 06/19/2019   Acute kidney injury (HCC) 06/19/2019   DM type 2 causing vascular disease (HCC) 04/13/2017   Diabetes mellitus without complication (HCC) 12/01/2016   Former smoker 11/02/2015   Diabetes type 2, uncontrolled 06/18/2015   Mixed hyperlipidemia 06/18/2015   BPH (benign prostatic hyperplasia) 06/18/2015   Self-catheterizes urinary bladder 06/18/2015   Hypertension associated with  diabetes (HCC) 07/08/2010   CAD, NATIVE VESSEL 10/29/2009   CAD (coronary artery disease), native coronary artery 10/29/2009   Current smoker 10/22/2009   AMI, INFERIOR WALL 10/22/2009    No current facility-administered medications on file prior to encounter.   Current Outpatient Medications on File Prior to Encounter  Medication Sig Dispense Refill   Aspirin 81 MG CAPS Take 1 tablet by mouth daily.     dapagliflozin propanediol (FARXIGA) 10 MG TABS tablet Take 1 tablet (10 mg total) by mouth daily before breakfast. 90 tablet 1   ezetimibe (ZETIA) 10 MG tablet Take 1 tablet (10 mg total) by mouth daily. 90 tablet 3   insulin glargine (LANTUS SOLOSTAR) 100 UNIT/ML Solostar Pen Inject 40 Units into the skin 2 (two) times daily. 24 mL 3   losartan (COZAAR) 25 MG tablet Take 1 tablet (25 mg total) by mouth daily. 90 tablet 1   Multiple Vitamin (MULTIVITAMIN ADULT PO) Take 1 tablet by mouth daily.     omega-3 acid ethyl esters (LOVAZA) 1 g capsule Take 2 capsules (2 g total) by mouth 2 (two) times daily. Please fill as a 90 day supply 360 capsule 3   phenazopyridine (PYRIDIUM) 100 MG tablet Take 1 tablet (100 mg total) by mouth 3 (three) times daily as needed (urinary pain). 10 tablet 0   Semaglutide,0.25 or 0.5MG /DOS, (OZEMPIC, 0.25 OR 0.5 MG/DOSE,) 2 MG/3ML SOPN Inject 0.5 mg into the skin once a week. 3 mL 1   Accu-Chek Softclix Lancets lancets Use as instructed to check blood glucose level by fingerstick twice per day. 100 each 12  Blood Glucose Monitoring Suppl (ACCU-CHEK GUIDE) w/Device KIT Use as instructed to check blood glucose level by fingerstick twice per day (Patient not taking: Reported on 08/15/2022) 1 kit 0   glucose blood (ACCU-CHEK GUIDE) test strip Use as instructed to check blood glucose by fingerstick twice per day. 100 each 2   Insulin Pen Needle (TECHLITE PEN NEEDLES) 32G X 4 MM MISC Inject as directed 2 (two) times daily. 100 each 3   Misc. Devices (CAREX COCCYX CUSHION)  MISC Use daily when sitting for pressure relief ICD10 L98.429 1 each prn   Misc. Devices MISC Please provide patient with standard sized 59F urinary catheters for self cathing along with lubricant. 50 each prn   triamcinolone cream (KENALOG) 0.1 % Apply 1 Application topically 2 (two) times daily. Apply to both hands (Patient not taking: Reported on 11/16/2022) 60 g 0   [DISCONTINUED] gemfibrozil (LOPID) 600 MG tablet Take 1 tablet (600 mg total) by mouth 2 (two) times daily before a meal. 180 tablet 1    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   Anemia    Bladder atonia    BPH (benign prostatic hyperplasia)    CAD, NATIVE VESSEL 10/29/2009   Diabetes mellitus type 2, uncontrolled, with complications    Hyperlipidemia LDL goal <70    Hypertension    Kidney stone    Neurogenic bladder as late effect of cerebrovascular accident (CVA)    OBESITY 10/22/2009   Prostate cancer (HCC)     Past Surgical History:  Procedure Laterality Date   BACK SURGERY     CORONARY STENT PLACEMENT     CYSTOSCOPY W/ URETERAL STENT PLACEMENT  02/26/2012   Procedure: CYSTOSCOPY WITH RETROGRADE PYELOGRAM/URETERAL STENT PLACEMENT;  Surgeon: Valetta Fuller, MD;  Location: [facility];  Service: Urology;  Laterality: Left;   CYSTOSCOPY WITH RETROGRADE PYELOGRAM, URETEROSCOPY AND STENT PLACEMENT Left 08/07/2019   Procedure: CYSTOSCOPY WITH LEFT RETROGRADE PYELOGRAM, URETEROSCOPY HOLMIUM LASER AND STENT PLACEMENT;  Surgeon: [name], MD;  Location: [facility];  Service: Urology;  Laterality: Left;   CYSTOSCOPY WITH STENT PLACEMENT Left 06/24/2019   Procedure: CYSTOSCOPY WITH STENT PLACEMENT LEFT URETER WITH LEFT RETROGRADE URETERAL;  Surgeon: Rene Paci, MD;  Location: [facility];  Service: Urology;  Laterality: Left;   testicles Right     Social History   Tobacco Use   Smoking status: Every Day    Current packs/day: 0.50    Average packs/day: 0.5 packs/day for 40.0 years (20.0  ttl pk-yrs)    Types: E-cigarettes, Cigarettes   Smokeless tobacco: Former  Building services engineer status: Former  Substance Use Topics   Alcohol use: Not Currently    Comment: occ   Drug use: No    Family History  Problem Relation Age of Onset   Hypertension Mother    Diabetes Father    Stomach cancer Maternal Uncle    Diabetes Paternal Aunt    Coronary artery disease Other    Heart attack Other    Breast cancer Neg Hx    Colon cancer Neg Hx    Pancreatic cancer Neg Hx    Prostate cancer Neg Hx    Esophageal cancer Neg Hx    Liver disease Neg Hx     PE: Vitals:   11/16/22 0200 11/16/22 0215 11/16/22 0230 11/16/22 0300  BP: (!) 91/54 (!) 97/55 (!) 81/48 (!) 99/55  Pulse: 84 75 77 85  Resp: 17 19 20  18  Temp:      TempSrc:      SpO2: 96% 97% 98% 97%  Weight:      Height:       Patient appears to be moderate distress patient is alert and oriented x3 Atraumatic normocephalic head No cervical or supraclavicular lymphadenopathy appreciated No increased work of breathing, no audible wheezes/rhonchi Regular sinus rhythm/rate Abdomen is soft, nontender, nondistended, left CVA tenderness Lower extremities are symmetric without appreciable edema Grossly neurologically intact No identifiable skin lesions  Recent Labs    11/15/22 2212  WBC 5.1  HGB 13.6  HCT 40.9   Recent Labs    11/15/22 2212  NA 138  K 3.7  CL 109  CO2 19*  GLUCOSE 142*  BUN 21  CREATININE 1.06  CALCIUM 8.8*   Recent Labs    11/15/22 2300 11/16/22 0037  INR 1.1 1.1   No results for input(s): "LABURIN" in the last 72 hours. Results for orders placed or performed during the hospital encounter of 11/15/22  Resp panel by RT-PCR (RSV, Flu A&B, Covid) Anterior Nasal Swab     Status: None   Collection Time: 11/16/22 12:43 AM   Specimen: Anterior Nasal Swab  Result Value Ref Range Status   SARS Coronavirus 2 by RT PCR NEGATIVE NEGATIVE Final   Influenza A by PCR NEGATIVE NEGATIVE Final    Influenza B by PCR NEGATIVE NEGATIVE Final    Comment: (NOTE) The Xpert Xpress SARS-CoV-2/FLU/RSV plus assay is intended as an aid in the diagnosis of influenza from Nasopharyngeal swab specimens and should not be used as a sole basis for treatment. Nasal washings and aspirates are unacceptable for Xpert Xpress SARS-CoV-2/FLU/RSV testing.  Fact Sheet for Patients: BloggerCourse.com  Fact Sheet for Healthcare Providers: SeriousBroker.it  This test is not yet approved or cleared by the [place] FDA and has been authorized for detection and/or diagnosis of SARS-CoV-2 by FDA under an Emergency Use Authorization (EUA). This EUA will remain in effect (meaning this test can be used) for the duration of the COVID-19 declaration under Section 564(b)(1) of the Act, 21 U.S.C. section 360bbb-3(b)(1), unless the authorization is terminated or revoked.     Resp Syncytial Virus by PCR NEGATIVE NEGATIVE Final    Comment: (NOTE) Fact Sheet for Patients: BloggerCourse.com  Fact Sheet for Healthcare Providers: SeriousBroker.it  This test is not yet approved or cleared by the [place] FDA and has been authorized for detection and/or diagnosis of SARS-CoV-2 by FDA under an Emergency Use Authorization (EUA). This EUA will remain in effect (meaning this test can be used) for the duration of the COVID-19 declaration under Section 564(b)(1) of the Act, 21 U.S.C. section 360bbb-3(b)(1), unless the authorization is terminated or revoked.  Performed at [facility], [address] [place], [place] [address]     Imaging: I reviewed the patient's CT scan obtained in the ER demonstrating a 5 mm proximal left ureteral stone with a large nonobstructing stone as well as smaller stones within the left kidney.  There are also some small  stones within the bladder.  Imp: The patient has a  infected left obstructing ureteral stone  Recommendations: Plan to take the patient to the operating room for urgent ureteral stent placement.  The patient will need subsequent ureteroscopy and stone removal once his infection clears.   Thank you for involving me in this patient's care. [name]

## 2022-11-16 NOTE — H&P (Signed)
NAME:  Shane Bond, MRN:  [id number], DOB:  [birth year], LOS: 0 ADMISSION DATE:  11/15/2022 CONSULTATION DATE:  11/16/2022 REFERRING MD:  Preston Fleeting - EDP CHIEF COMPLAINT:  L flank pain  History of Present Illness:  62 year old man who presented to [facility] ED 9/24 with L flank pain and nausea with vomiting. PMHx significant for HTN, HLD, CAD with MI (inferior wall), CVA c/b neurogenic bladder with frequent UTI and nephrolithiasis (utilizes CIC), T2DM, BPH, prostate CA.  On ED arrival, patient was febrile to Tmax 103F, tachycardic to 110s, normotensive with BP 110/71, RR 18, SpO2 95%. Labs were notable for WBC 5.1, Hgb 13.6, Plt 153. INR 1.1. Na 138, K 3.7, CO2 19, Cr 1.06 (baseline 0.7-0.9), AST 38/ALT 49, Alk Phos/Tbili WNL. LA 2.8. COVID/Flu/RSV negative. UA SG > 1.046, > 500 glucose, moderate Hgb, nitrite negative. CXR without active cardiopulmonary disease. CT A/P with mild-moderate L hydronephrosis and delayed L renal cortical enhancement 2/2 obstructing 5mm calculus at left UVJ, moderate L perinephric stranding and punctate 1-57mm calculi in bladder. Broad-spectrum Vanc/Cefepime given in ED.  Urology was consulted and took patient to OR 9/25AM for cystoscopy and L ureteral stent placement. Intraoperative course was uncomplicated and patient was brought to PACU postoperatively. Given downtrending BP despite albumin, peripheral vasopressors were ordered.  PCCM consulted for ICU admission.   Pertinent Medical History:   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   Anemia    Bladder atonia    BPH (benign prostatic hyperplasia)    CAD, NATIVE VESSEL 10/29/2009   Diabetes mellitus type 2, uncontrolled, with complications    Hyperlipidemia LDL goal <70    Hypertension    Kidney stone    Neurogenic bladder as late effect of cerebrovascular accident (CVA)    OBESITY 10/22/2009   Prostate cancer (HCC)    Significant Hospital Events: Including procedures, antibiotic start and  stop dates in addition to other pertinent events   9/24 - Presented to [facility] for L flank pain. Found to have mild-moderate L hydro + stranding. 9/25 - Taken to OR by Uro for ureteral stent placement. PCCM consulted for pressors/ICU admission.  Interim History / Subjective:  PCCM consulted for ICU admission.  Objective:  Blood pressure (!) 86/36, pulse 82, temperature (!) 100.8 F (38.2 C), temperature source Rectal, resp. rate (!) 26, height 6\' 1"  (1.854 m), weight 110.2 kg, SpO2 92%.        Intake/Output Summary (Last 24 hours) at 11/16/2022 0448 Last data filed at 11/16/2022 0438 Gross per 24 hour  Intake 2699.52 ml  Output --  Net 2699.52 ml   Filed Weights   11/15/22 2147  Weight: 110.2 kg   Physical Examination: General: Acutely ill-appearing middle-aged man in NAD. Somnolent. HEENT: [place]/AT, anicteric sclera, PERRL, dry mucous membranes. Neuro: Drowsy, but wakes readily to voice. A&Ox4. Responds to verbal stimuli. Following commands consistently. Moves all 4 extremities spontaneously. CV: RRR, no m/g/r. PULM: Breathing even and unlabored on 3LNC. Lung fields CTAB. GI: Obese, soft, nontender, nondistended. Hypoactive bowel sounds. GU: Foley bag with light yellow blood-tinged urine. Extremities: No significant LE edema noted. Skin: Warm/dry, no rashes.  Resolved Hospital Problem List:    Assessment & Plan:   Shock, presumed secondary to urosepsis in the setting of ureteral calculus - Admit to ICU for close monitoring in the setting of resolving urosepsis - Goal MAP > 65 or SBP > 90 - Fluid resuscitation as tolerated, appears clinically dry on exam - Peripheral Levophed  titrated to goal MAP/SBP - Trend WBC, fever curve, LA - F/u Cx data - Continue broad-spectrum antibiotics (ceftriaxone, Flagyl)  Ureterovesical junction obstructing calculus S/p cystoscopy and stent placement 9/25 - Urology following, appreciate recommendations - Stent management per Uro - Foley in  place, remove as appropriate - Monitor I&Os - Antibiotics as above, narrow pending Cx data - Pyridium PRN for pain, may require oxybutynin if bladder spasms - Will eventually need definitive management of L nephrolithiasis, f/u re: OR timing  Prior CVA c/b neurogenic bladder CIC at home. No major focal neurologic deficits. - Foley catheter for now - Neuroprotective measures: HOB > 30 degrees, normoglycemia, normothermia, electrolytes WNL  HTN HLD CAD with previous MI - Hold home antihypertensives at present in the setting of hypotension/pressor needs - Resume ASA, Zetia  T2DM - SSI - CBGs Q4H, AC/HS when taking PO - Goal CBG 140-180 - Hold home medications for now  Best Practice: (right click and "Reselect all SmartList Selections" daily)   Diet/type: full liquids  DVT prophylaxis: DOAC GI prophylaxis: N/A Lines: N/A Foley:  Yes, and it is still needed Code Status:  full code Last date of multidisciplinary goals of care discussion [Pending]  Labs:  CBC: Recent Labs  Lab 11/15/22 2212  WBC 5.1  NEUTROABS 4.5  HGB 13.6  HCT 40.9  MCV 95.8  PLT 153   Basic Metabolic Panel: Recent Labs  Lab 11/15/22 2212  NA 138  K 3.7  CL 109  CO2 19*  GLUCOSE 142*  BUN 21  CREATININE 1.06  CALCIUM 8.8*   GFR: Estimated Creatinine Clearance: 95.2 mL/min (by C-G formula based on SCr of 1.06 mg/dL). Recent Labs  Lab 11/15/22 2212 11/15/22 2304  WBC 5.1  --   LATICACIDVEN  --  2.8*   Liver Function Tests: Recent Labs  Lab 11/15/22 2212  AST 38  ALT 49*  ALKPHOS 91  BILITOT 0.5  PROT 7.4  ALBUMIN 3.4*   No results for input(s): "LIPASE", "AMYLASE" in the last 168 hours. No results for input(s): "AMMONIA" in the last 168 hours.  ABG:    Component Value Date/Time   PHART 7.408 06/23/2019 0045   PCO2ART 32.0 06/23/2019 0045   PO2ART 100 06/23/2019 0045   HCO3 20.1 06/23/2019 0045   TCO2 21.1 11/21/2014 1522   ACIDBASEDEF 3.7 (H) 06/23/2019 0045   O2SAT  94.8 06/23/2019 0045    Coagulation Profile: Recent Labs  Lab 11/15/22 2300 11/16/22 0037  INR 1.1 1.1   Cardiac Enzymes: No results for input(s): "CKTOTAL", "CKMB", "CKMBINDEX", "TROPONINI" in the last 168 hours.  HbA1C: Hemoglobin A1C  Date/Time Value Ref Range Status  08/15/2022 11:33 AM 6.9 (A) 4.0 - 5.6 % Final  05/13/2022 01:52 PM 7.2 (A) 4.0 - 5.6 % Final   HbA1c, POC (controlled diabetic range)  Date/Time Value Ref Range Status  02/10/2022 09:55 AM 7.5 (A) 0.0 - 7.0 % Final  07/02/2020 08:48 AM 7.3 (A) 0.0 - 7.0 % Final   CBG: No results for input(s): "GLUCAP" in the last 168 hours.  Review of Systems:   Review of systems completed with pertinent positives/negatives outlined in above HPI.  Past Medical History:  He,  has a past medical history of AMI, INFERIOR WALL (10/22/2009), Anemia, Bladder atonia, BPH (benign prostatic hyperplasia), CAD, NATIVE VESSEL (10/29/2009), Diabetes mellitus type 2, uncontrolled, with complications, Hyperlipidemia LDL goal <70, Hypertension, Kidney stone, Neurogenic bladder as late effect of cerebrovascular accident (CVA), OBESITY (10/22/2009), and Prostate cancer (HCC).   Surgical  History:   Past Surgical History:  Procedure Laterality Date   BACK SURGERY     CORONARY STENT PLACEMENT     CYSTOSCOPY W/ URETERAL STENT PLACEMENT  02/26/2012   Procedure: CYSTOSCOPY WITH RETROGRADE PYELOGRAM/URETERAL STENT PLACEMENT;  Surgeon: Valetta Fuller, MD;  Location: [facility];  Service: Urology;  Laterality: Left;   CYSTOSCOPY WITH RETROGRADE PYELOGRAM, URETEROSCOPY AND STENT PLACEMENT Left 08/07/2019   Procedure: CYSTOSCOPY WITH LEFT RETROGRADE PYELOGRAM, URETEROSCOPY HOLMIUM LASER AND STENT PLACEMENT;  Surgeon: [name], MD;  Location: [facility];  Service: Urology;  Laterality: Left;   CYSTOSCOPY WITH STENT PLACEMENT Left 06/24/2019   Procedure: CYSTOSCOPY WITH STENT PLACEMENT LEFT URETER WITH LEFT RETROGRADE URETERAL;  Surgeon: Rene Paci, MD;  Location: [facility];  Service: Urology;  Laterality: Left;   testicles Right     Social History:   reports that he has been smoking e-cigarettes and cigarettes. He has a 20 pack-year smoking history. He has quit using smokeless tobacco. He reports that he does not currently use alcohol. He reports that he does not use drugs.   Family History:  His family history includes Coronary artery disease in an other family member; Diabetes in his father and paternal aunt; Heart attack in an other family member; Hypertension in his mother; Stomach cancer in his maternal uncle. There is no history of Breast cancer, Colon cancer, Pancreatic cancer, Prostate cancer, Esophageal cancer, or Liver disease.   Allergies: Allergies  Allergen Reactions   Lipitor [Atorvastatin] Rash   Lisinopril Rash    Home Medications: Prior to Admission medications   Medication Sig Start Date End Date Taking? Authorizing Provider  Aspirin 81 MG CAPS Take 1 tablet by mouth daily.   Yes [provider]  dapagliflozin propanediol (FARXIGA) 10 MG TABS tablet Take 1 tablet (10 mg total) by mouth daily before breakfast. 05/13/22  Yes [name], NP  ezetimibe (ZETIA) 10 MG tablet Take 1 tablet (10 mg total) by mouth daily. 05/14/22  Yes [name], NP  insulin glargine (LANTUS SOLOSTAR) 100 UNIT/ML Solostar Pen Inject 40 Units into the skin 2 (two) times daily. 09/21/22  Yes Hoy Register, MD  losartan (COZAAR) 25 MG tablet Take 1 tablet (25 mg total) by mouth daily. 09/06/22  Yes Hoy Register, MD  Multiple Vitamin (MULTIVITAMIN ADULT PO) Take 1 tablet by mouth daily.   Yes [provider]  omega-3 acid ethyl esters (LOVAZA) 1 g capsule Take 2 capsules (2 g total) by mouth 2 (two) times daily. Please fill as a 90 day supply 08/15/22  Yes [name], NP  phenazopyridine (PYRIDIUM) 100 MG tablet Take 1 tablet (100 mg total) by mouth 3 (three) times daily as needed (urinary pain). 09/12/22   Yes Banister, Janace Aris, MD  Semaglutide,0.25 or 0.5MG /DOS, (OZEMPIC, 0.25 OR 0.5 MG/DOSE,) 2 MG/3ML SOPN Inject 0.5 mg into the skin once a week. 08/15/22  Yes [name], NP  Accu-Chek Softclix Lancets lancets Use as instructed to check blood glucose level by fingerstick twice per day. 08/15/22   [name], NP  Blood Glucose Monitoring Suppl (ACCU-CHEK GUIDE) w/Device KIT Use as instructed to check blood glucose level by fingerstick twice per day Patient not taking: Reported on 08/15/2022 04/26/21   [name], NP  glucose blood (ACCU-CHEK GUIDE) test strip Use as instructed to check blood glucose by fingerstick twice per day. 08/15/22   [name], NP  Insulin Pen Needle (TECHLITE PEN NEEDLES) 32G X  4 MM MISC Inject as directed 2 (two) times daily. 09/06/22   Hoy Register, MD  Misc. Devices (CAREX COCCYX CUSHION) MISC Use daily when sitting for pressure relief ICD10 L98.429 07/02/20   [name], NP  Misc. Devices MISC Please provide patient with standard sized 37F urinary catheters for self cathing along with lubricant. 05/29/18   [name], NP  triamcinolone cream (KENALOG) 0.1 % Apply 1 Application topically 2 (two) times daily. Apply to both hands Patient not taking: Reported on 11/16/2022 08/15/22   [name], NP  gemfibrozil (LOPID) 600 MG tablet Take 1 tablet (600 mg total) by mouth 2 (two) times daily before a meal. 07/20/19 11/04/19  [name], NP    Critical care time:   The patient is critically ill with multiple organ system failure and requires high complexity decision making for assessment and support, frequent evaluation and titration of therapies, advanced monitoring, review of radiographic studies and interpretation of complex data.   Critical Care Time devoted to patient care services, exclusive of separately billable procedures, described in this note is 32 minutes.  Tim Lair, PA-C [facility] 11/16/22  4:48 AM  Please see [email or web address] for pager details.  From 7A-7P if no response, please call [phone] After hours, please call ELink [phone]

## 2022-11-16 NOTE — Anesthesia Postprocedure Evaluation (Signed)
Anesthesia Post Note  Patient: Shane Bond  Procedure(s) Performed: 1. Cystoscopy 2. left ureteral stent placement 3. left retrograde pyelography with interpretation (Left: Ureter)     Patient location during evaluation: PACU Anesthesia Type: General Level of consciousness: awake and alert Pain management: pain level controlled Vital Signs Assessment: post-procedure vital signs reviewed and stable Respiratory status: spontaneous breathing, nonlabored ventilation, respiratory function stable and patient connected to nasal cannula oxygen Cardiovascular status: blood pressure returned to baseline and stable Postop Assessment: no apparent nausea or vomiting Anesthetic complications: no  No notable events documented.  Last Vitals:  Vitals:   11/16/22 0645 11/16/22 0700  BP: (!) 115/57 (!) 111/54  Pulse: (!) 59 (!) 55  Resp: 15 15  Temp:    SpO2: 94% 93%    Last Pain:  Vitals:   11/16/22 0645  TempSrc:   PainSc: 0-No pain                 Shelton Silvas

## 2022-11-16 NOTE — Hospital Course (Addendum)
Febrile 103 Hypotensive  Started on ceftrixone, metronidazole, vancomycin, LR 2.5L  Blood cx, Covid  UA- hematuria. Glucosuria, dehydrated  MPRESSION: 1. Mild to moderate left hydronephrosis and delayed left renal cortical enhancement secondary to an obstructing 5 mm calculus at the left ureterovesicular junction. Superimposed 19 mm nonobstructing calculus within the lower pole the left kidney. 2. Moderate left perinephric stranding, possibly related to forniceal rupture or superimposed infection. Correlation with urinalysis and urine culture is recommended for further evaluation. 3. Punctate 1-2 mm layering calculi within the bladder lumen. 4. Extensive multi-vessel coronary artery calcification. 5. Moderate supraumbilical ventral hernia containing a single loop of unremarkable mid small bowel.  Obstructing left renal calculi  -urology may need to transfer to [place]  Elevated lactate LA 2.8  NAGMA  Bicarb 19

## 2022-11-16 NOTE — Plan of Care (Signed)

## 2022-11-16 NOTE — Progress Notes (Signed)
Critical Care Attending:  62 year old man who underwent ureteric stenting for an infected stone. He presented with fever and flank pain.   Post operatively, he was seen be critical care for borderline hypotension. The plan was to admit him to the ICU for potential vasopressors.   Since initial assessment at 4:45 the patient has not required pressors and SBP's have not been less than 110.  On examination, he was able to answer my questions. Was in no distress. Requiring only 3lpm [place]. Capillary refill was normal with warm extremities.   At this point the residual hypotension from anesthesia appears to have resolved and the patient can be transferred to Med-Surg instead.   [facility] has been informed to assume care tomorrow.   .rasi

## 2022-11-16 NOTE — Progress Notes (Signed)
The patient is status post left ureteral stent.  He had a stone obstructing in the proximal left ureter.  Stent was well-seated and appears to be in good position.  A Foley catheter was replaced.  The patient should be treated with culture specific antibiotics and discharged home once stable.  We will follow-up with him in regards to a definitive OR time for removal of the stones in his left kidney.  The Foley catheter can be removed by the discretion of the primary team.

## 2022-11-16 NOTE — Transfer of Care (Signed)
Immediate Anesthesia Transfer of Care Note  Patient: Shane Bond  Procedure(s) Performed: CYSTOSCOPY WITH RETROGRADE PYELOGRAM/URETERAL STENT PLACEMENT (Left)  Patient Location: PACU  Anesthesia Type:General  Level of Consciousness: drowsy, patient cooperative, and responds to stimulation  Airway & Oxygen Therapy: Patient Spontanous Breathing and Patient connected to nasal cannula oxygen  Post-op Assessment: Report given to RN, Post -op Vital signs reviewed and stable, and Patient moving all extremities X 4  Post vital signs: Reviewed and stable  Last Vitals:  Vitals Value Taken Time  BP 86/36 11/16/22 0443  Temp    Pulse 82 11/16/22 0443  Resp 26 11/16/22 0443  SpO2 92 % 11/16/22 0443    Last Pain:  Vitals:   11/16/22 0046  TempSrc:   PainSc: 2          Complications: No notable events documented.

## 2022-11-16 NOTE — Progress Notes (Signed)
PHARMACY - PHYSICIAN COMMUNICATION CRITICAL VALUE ALERT - BLOOD CULTURE IDENTIFICATION (BCID)  Shane Bond is an 62 y.o. male who presented to [facility] on 11/15/2022 with a chief complaint of UTI.  Assessment:  Pt with 1 of 2 bottles from BOTH sets of blood cultures (suspected source urine)  Name of physician (or Provider) Contacted: Dr. Warrick Parisian  Current antibiotics: Rocephin 1gm IV q24h  Changes to prescribed antibiotics recommended:  Change antibiotics to Ancef 2gm IV q8h ID will be automatically consulted  Results for orders placed or performed during the hospital encounter of 11/15/22  Blood Culture ID Panel (Reflexed) (Collected: 11/15/2022 10:25 PM)  Result Value Ref Range   Enterococcus faecalis NOT DETECTED NOT DETECTED   Enterococcus Faecium NOT DETECTED NOT DETECTED   Listeria monocytogenes NOT DETECTED NOT DETECTED   Staphylococcus species DETECTED (A) NOT DETECTED   Staphylococcus aureus (BCID) NOT DETECTED NOT DETECTED   Staphylococcus epidermidis DETECTED (A) NOT DETECTED   Staphylococcus lugdunensis NOT DETECTED NOT DETECTED   Streptococcus species NOT DETECTED NOT DETECTED   Streptococcus agalactiae NOT DETECTED NOT DETECTED   Streptococcus pneumoniae NOT DETECTED NOT DETECTED   Streptococcus pyogenes NOT DETECTED NOT DETECTED   A.calcoaceticus-baumannii NOT DETECTED NOT DETECTED   Bacteroides fragilis NOT DETECTED NOT DETECTED   Enterobacterales NOT DETECTED NOT DETECTED   Enterobacter cloacae complex NOT DETECTED NOT DETECTED   Escherichia coli NOT DETECTED NOT DETECTED   Klebsiella aerogenes NOT DETECTED NOT DETECTED   Klebsiella oxytoca NOT DETECTED NOT DETECTED   Klebsiella pneumoniae NOT DETECTED NOT DETECTED   Proteus species NOT DETECTED NOT DETECTED   Salmonella species NOT DETECTED NOT DETECTED   Serratia marcescens NOT DETECTED NOT DETECTED   Haemophilus influenzae NOT DETECTED NOT DETECTED   Neisseria meningitidis NOT DETECTED NOT DETECTED    Pseudomonas aeruginosa NOT DETECTED NOT DETECTED   Stenotrophomonas maltophilia NOT DETECTED NOT DETECTED   Candida albicans NOT DETECTED NOT DETECTED   Candida auris NOT DETECTED NOT DETECTED   Candida glabrata NOT DETECTED NOT DETECTED   Candida krusei NOT DETECTED NOT DETECTED   Candida parapsilosis NOT DETECTED NOT DETECTED   Candida tropicalis NOT DETECTED NOT DETECTED   Cryptococcus neoformans/gattii NOT DETECTED NOT DETECTED   Methicillin resistance mecA/C NOT DETECTED NOT DETECTED    Christoper Fabian, PharmD, BCPS Please see amion for complete clinical pharmacist phone list 11/16/2022  9:10 PM

## 2022-11-16 NOTE — Anesthesia Procedure Notes (Signed)
Procedure Name: Intubation Date/Time: 11/16/2022 4:01 AM  Performed by: Aundria Rud, CRNAPre-anesthesia Checklist: Patient identified, Emergency Drugs available, Suction available and Patient being monitored Patient Re-evaluated:Patient Re-evaluated prior to induction Oxygen Delivery Method: Circle System Utilized Preoxygenation: Pre-oxygenation with 100% oxygen Induction Type: IV induction, Rapid sequence and Cricoid Pressure applied Ventilation: Mask ventilation without difficulty Laryngoscope Size: Mac and 4 Grade View: Grade I Tube type: Oral Tube size: 7.5 mm Number of attempts: 1 Airway Equipment and Method: Stylet Placement Confirmation: ETT inserted through vocal cords under direct vision, positive ETCO2 and breath sounds checked- equal and bilateral Secured at: 23 cm Tube secured with: Tape Dental Injury: Teeth and Oropharynx as per pre-operative assessment

## 2022-11-16 NOTE — Anesthesia Preprocedure Evaluation (Addendum)
Anesthesia Evaluation  Patient identified by MRN, date of birth, ID band Patient awake    Reviewed: Allergy & Precautions, NPO status , Patient's Chart, lab work & pertinent test results  Airway Mallampati: II  TM Distance: >3 FB Neck ROM: Full    Dental  (+) Poor Dentition, Missing, Chipped, Dental Advisory Given   Pulmonary Current Smoker and Patient abstained from smoking.   breath sounds clear to auscultation       Cardiovascular hypertension, + CAD and + Past MI   Rhythm:Regular Rate:Normal     Neuro/Psych  Neuromuscular disease  negative psych ROS   GI/Hepatic negative GI ROS, Neg liver ROS,,,  Endo/Other  diabetes, Type 2, Oral Hypoglycemic Agents, Insulin Dependent    Renal/GU Renal disease     Musculoskeletal negative musculoskeletal ROS (+)    Abdominal   Peds  Hematology negative hematology ROS (+)   Anesthesia Other Findings   Reproductive/Obstetrics                             Anesthesia Physical Anesthesia Plan  ASA: 3 and emergent  Anesthesia Plan: General   Post-op Pain Management: Tylenol PO (pre-op)* and Toradol IV (intra-op)*   Induction: Intravenous, Rapid sequence and Cricoid pressure planned  PONV Risk Score and Plan: 2 and Ondansetron and Midazolam  Airway Management Planned: Oral ETT  Additional Equipment: None  Intra-op Plan:   Post-operative Plan: Extubation in OR  Informed Consent: I have reviewed the patients History and Physical, chart, labs and discussed the procedure including the risks, benefits and alternatives for the proposed anesthesia with the patient or authorized representative who has indicated his/her understanding and acceptance.     Dental advisory given  Plan Discussed with: CRNA  Anesthesia Plan Comments:        Anesthesia Quick Evaluation

## 2022-11-17 ENCOUNTER — Encounter (HOSPITAL_COMMUNITY): Payer: Self-pay | Admitting: Urology

## 2022-11-17 DIAGNOSIS — N201 Calculus of ureter: Secondary | ICD-10-CM | POA: Diagnosis not present

## 2022-11-17 LAB — CULTURE, BLOOD (ROUTINE X 2): Special Requests: ADEQUATE

## 2022-11-17 LAB — CBC
HCT: 35.9 % — ABNORMAL LOW (ref 39.0–52.0)
Hemoglobin: 12.1 g/dL — ABNORMAL LOW (ref 13.0–17.0)
MCH: 33.3 pg (ref 26.0–34.0)
MCHC: 33.7 g/dL (ref 30.0–36.0)
MCV: 98.9 fL (ref 80.0–100.0)
Platelets: 119 10*3/uL — ABNORMAL LOW (ref 150–400)
RBC: 3.63 MIL/uL — ABNORMAL LOW (ref 4.22–5.81)
RDW: 13.9 % (ref 11.5–15.5)
WBC: 10.2 10*3/uL (ref 4.0–10.5)
nRBC: 0 % (ref 0.0–0.2)

## 2022-11-17 LAB — BASIC METABOLIC PANEL
Anion gap: 6 (ref 5–15)
BUN: 19 mg/dL (ref 8–23)
CO2: 21 mmol/L — ABNORMAL LOW (ref 22–32)
Calcium: 8.7 mg/dL — ABNORMAL LOW (ref 8.9–10.3)
Chloride: 111 mmol/L (ref 98–111)
Creatinine, Ser: 0.97 mg/dL (ref 0.61–1.24)
GFR, Estimated: >60 mL/min (ref >60)
Glucose, Bld: 194 mg/dL — ABNORMAL HIGH (ref 70–99)
Potassium: 4.2 mmol/L (ref 3.5–5.1)
Sodium: 138 mmol/L (ref 135–145)

## 2022-11-17 LAB — GLUCOSE, CAPILLARY
Glucose-Capillary: 182 mg/dL — ABNORMAL HIGH (ref 70–99)
Glucose-Capillary: 203 mg/dL — ABNORMAL HIGH (ref 70–99)
Glucose-Capillary: 220 mg/dL — ABNORMAL HIGH (ref 70–99)
Glucose-Capillary: 235 mg/dL — ABNORMAL HIGH (ref 70–99)
Glucose-Capillary: 240 mg/dL — ABNORMAL HIGH (ref 70–99)
Glucose-Capillary: 307 mg/dL — ABNORMAL HIGH (ref 70–99)

## 2022-11-17 LAB — PHOSPHORUS: Phosphorus: 2.5 mg/dL (ref 2.5–4.6)

## 2022-11-17 LAB — MAGNESIUM: Magnesium: 2.1 mg/dL (ref 1.7–2.4)

## 2022-11-17 MED ORDER — CHLORHEXIDINE GLUCONATE CLOTH 2 % EX PADS
6.0000 | MEDICATED_PAD | Freq: Every day | CUTANEOUS | Status: DC
  Administered 2022-11-17 – 2022-11-19 (×3): 6 via TOPICAL

## 2022-11-17 MED ORDER — NICOTINE 21 MG/24HR TD PT24
21.0000 mg | MEDICATED_PATCH | Freq: Every day | TRANSDERMAL | Status: DC
  Administered 2022-11-17 – 2022-11-19 (×3): 21 mg via TRANSDERMAL
  Filled 2022-11-17 (×3): qty 1

## 2022-11-17 MED ORDER — INSULIN ASPART 100 UNIT/ML IJ SOLN
0.0000 [IU] | Freq: Three times a day (TID) | INTRAMUSCULAR | Status: DC
  Administered 2022-11-17 (×3): 3 [IU] via SUBCUTANEOUS
  Administered 2022-11-18: 2 [IU] via SUBCUTANEOUS
  Administered 2022-11-18: 3 [IU] via SUBCUTANEOUS
  Administered 2022-11-18: 1 [IU] via SUBCUTANEOUS
  Administered 2022-11-18 – 2022-11-19 (×2): 2 [IU] via SUBCUTANEOUS

## 2022-11-17 MED ORDER — EZETIMIBE 10 MG PO TABS
10.0000 mg | ORAL_TABLET | Freq: Every day | ORAL | Status: DC
  Administered 2022-11-17 – 2022-11-19 (×3): 10 mg via ORAL
  Filled 2022-11-17 (×3): qty 1

## 2022-11-17 MED ORDER — DAPAGLIFLOZIN PROPANEDIOL 10 MG PO TABS
10.0000 mg | ORAL_TABLET | Freq: Every day | ORAL | Status: DC
  Administered 2022-11-18 – 2022-11-19 (×2): 10 mg via ORAL
  Filled 2022-11-17 (×2): qty 1

## 2022-11-17 MED ORDER — INSULIN GLARGINE-YFGN 100 UNIT/ML ~~LOC~~ SOLN
20.0000 [IU] | Freq: Two times a day (BID) | SUBCUTANEOUS | Status: DC
  Administered 2022-11-17 – 2022-11-19 (×5): 20 [IU] via SUBCUTANEOUS
  Filled 2022-11-17 (×6): qty 0.2

## 2022-11-17 MED ORDER — ASPIRIN 81 MG PO CHEW
81.0000 mg | CHEWABLE_TABLET | Freq: Every day | ORAL | Status: DC
  Administered 2022-11-17 – 2022-11-19 (×3): 81 mg via ORAL
  Filled 2022-11-17 (×3): qty 1

## 2022-11-17 NOTE — Consult Note (Signed)
[facility]  Total days of antibiotics 3         Reason for Consult: staph epi bacteremia   Referring Physician: gherghe  Principal Problem:   Ureteral calculus, left    HPI: Shane Bond is a 62 y.o. male with CAD, hx of CVA with neurogenic bladder, HTN, hx of urolithiasis, prostate ca on lupron injection, -- he was started to have left flank pain with associated nausea and vomiting which was concerning nephrolithiasis, thus presented to ED for evaluation. In the ED, he had sepsis with 103F, hypotension, tachycardia, required vasopressors. CT scan showed mild-moderate left hydronephrosis with perinephric stranding. Urology placed left ureter stent. Infectious work up showed 4/4 bottles with staph epidermis. He was initially placed on broad spectrum abtx and now narrowed to cefazolin. He is now feels improved.  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   Anemia    Bladder atonia    BPH (benign prostatic hyperplasia)    CAD, NATIVE VESSEL 10/29/2009   Diabetes mellitus type 2, uncontrolled, with complications    Hyperlipidemia LDL goal <70    Hypertension    Kidney stone    Neurogenic bladder as late effect of cerebrovascular accident (CVA)    OBESITY 10/22/2009   Prostate cancer (HCC)     Allergies:  Allergies  Allergen Reactions   Lipitor [Atorvastatin] Rash   Lisinopril Rash    Current antibiotics:   MEDICATIONS:  aspirin  81 mg Oral Daily   Chlorhexidine Gluconate Cloth  6 each Topical Daily   [START ON 11/18/2022] dapagliflozin propanediol  10 mg Oral QAC breakfast   ezetimibe  10 mg Oral Daily   heparin  5,000 Units Subcutaneous Q8H   insulin aspart  0-9 Units Subcutaneous TID AC & HS   insulin glargine-yfgn  20 Units Subcutaneous BID   nicotine  21 mg Transdermal Daily    Social History   Tobacco Use   Smoking status: Every Day    Current packs/day: 0.50    Average packs/day: 0.5 packs/day for 40.0  years (20.0 ttl pk-yrs)    Types: E-cigarettes, Cigarettes   Smokeless tobacco: Former  Building services engineer status: Former  Substance Use Topics   Alcohol use: Not Currently    Comment: occ   Drug use: No    Family History  Problem Relation Age of Onset   Hypertension Mother    Diabetes Father    Stomach cancer Maternal Uncle    Diabetes Paternal Aunt    Coronary artery disease Other    Heart attack Other    Breast cancer Neg Hx    Colon cancer Neg Hx    Pancreatic cancer Neg Hx    Prostate cancer Neg Hx    Esophageal cancer Neg Hx    Liver disease Neg Hx     Review of Systems - 12 point ros is negative except what is mentioned above   OBJECTIVE: Temp:  [97.6 F (36.4 C)-98.3 F (36.8 C)] 97.9 F (36.6 C) (09/26 0813) Pulse Rate:  [49-63] 56 (09/26 1519) Resp:  [16-19] 16 (09/26 1519) BP: (102-127)/(51-66) 127/51 (09/26 1519) SpO2:  [94 %-97 %] 97 % (09/26 1519) Physical Exam  Constitutional: He is oriented to person, place, and time. He appears well-developed and well-nourished. No distress.  HENT:  Mouth/Throat: Oropharynx is clear and moist. No oropharyngeal exudate.  Cardiovascular: Normal rate, regular rhythm and normal heart sounds. Exam reveals no gallop  and no friction rub.  No murmur heard.  Pulmonary/Chest: Effort normal and breath sounds normal. No respiratory distress. He has no wheezes.  Abdominal: Soft. Bowel sounds are normal. He exhibits no distension. There is no tenderness.  Lymphadenopathy:  He has no cervical adenopathy.  Neurological: He is alert and oriented to person, place, and time.  Skin: Skin is warm and dry. No rash noted. No erythema.  Psychiatric: He has a normal mood and affect. His behavior is normal.    LABS: Results for orders placed or performed during the hospital encounter of 11/15/22 (from the past 48 hour(s))  Comprehensive metabolic panel     Status: Abnormal   Collection Time: 11/15/22 10:12 PM  Result Value Ref Range    Sodium 138 135 - 145 mmol/L   Potassium 3.7 3.5 - 5.1 mmol/L   Chloride 109 98 - 111 mmol/L   CO2 19 (L) 22 - 32 mmol/L   Glucose, Bld 142 (H) 70 - 99 mg/dL    Comment: Glucose reference range applies only to samples taken after fasting for at least 8 hours.   BUN 21 8 - 23 mg/dL   Creatinine, Ser 9.14 0.61 - 1.24 mg/dL   Calcium 8.8 (L) 8.9 - 10.3 mg/dL   Total Protein 7.4 6.5 - 8.1 g/dL   Albumin 3.4 (L) 3.5 - 5.0 g/dL   AST 38 15 - 41 U/L   ALT 49 (H) 0 - 44 U/L   Alkaline Phosphatase 91 38 - 126 U/L   Total Bilirubin 0.5 0.3 - 1.2 mg/dL   GFR, Estimated >78 >29 mL/min    Comment: (NOTE) Calculated using the CKD-EPI Creatinine Equation (2021)    Anion gap 10 5 - 15    Comment: Performed at [facility] Lab, [address] [place], [place] [address]  CBC with Differential     Status: Abnormal   Collection Time: 11/15/22 10:12 PM  Result Value Ref Range   WBC 5.1 4.0 - 10.5 K/uL   RBC 4.27 4.22 - 5.81 MIL/uL   Hemoglobin 13.6 13.0 - 17.0 g/dL   HCT 08.6 57.8 - 46.9 %   MCV 95.8 80.0 - 100.0 fL   MCH 31.9 26.0 - 34.0 pg   MCHC 33.3 30.0 - 36.0 g/dL   RDW 62.9 52.8 - 41.3 %   Platelets 153 150 - 400 K/uL   nRBC 0.0 0.0 - 0.2 %   Neutrophils Relative % 89 %   Neutro Abs 4.5 1.7 - 7.7 K/uL   Lymphocytes Relative 8 %   Lymphs Abs 0.4 (L) 0.7 - 4.0 K/uL   Monocytes Relative 1 %   Monocytes Absolute 0.1 0.1 - 1.0 K/uL   Eosinophils Relative 1 %   Eosinophils Absolute 0.0 0.0 - 0.5 K/uL   Basophils Relative 1 %   Basophils Absolute 0.0 0.0 - 0.1 K/uL   Immature Granulocytes 0 %   Abs Immature Granulocytes 0.02 0.00 - 0.07 K/uL    Comment: Performed at [facility], [address] [place], [place] [address]  Culture, blood (Routine x 2)     Status: Abnormal (Preliminary result)   Collection Time: 11/15/22 10:12 PM   Specimen: BLOOD  Result Value Ref Range   Specimen Description BLOOD SITE NOT SPECIFIED    Special Requests      BOTTLES DRAWN AEROBIC AND  ANAEROBIC Blood Culture results may not be optimal due to an excessive volume of blood received in culture bottles   Culture  Setup Time      GRAM POSITIVE COCCI IN BOTH AEROBIC AND ANAEROBIC BOTTLES CRITICAL RESULT CALLED TO, READ BACK BY AND VERIFIED WITH: Wynn Banker [id number] @2035  FH Performed at [facility] Lab, [address] [place], [place] [address]    Culture STAPHYLOCOCCUS EPIDERMIDIS (A)    Report Status PENDING   Culture, blood (Routine x 2)     Status: Abnormal (Preliminary result)   Collection Time: 11/15/22 10:25 PM   Specimen: BLOOD  Result Value Ref Range   Specimen Description BLOOD SITE NOT SPECIFIED    Special Requests      BOTTLES DRAWN AEROBIC AND ANAEROBIC Blood Culture adequate volume   Culture  Setup Time      GRAM POSITIVE COCCI IN CLUSTERS IN BOTH AEROBIC AND ANAEROBIC BOTTLES CRITICAL RESULT CALLED TO, READ BACK BY AND VERIFIED WITH: PHARMD K. AHMEND [id number] @2035  FH    Culture (A)     STAPHYLOCOCCUS EPIDERMIDIS CULTURE REINCUBATED FOR BETTER GROWTH Performed at [facility] Lab, [address], [place], [place] [address]    Report Status PENDING   Blood Culture ID Panel (Reflexed)     Status: Abnormal   Collection Time: 11/15/22 10:25 PM  Result Value Ref Range   Enterococcus faecalis NOT DETECTED NOT DETECTED   Enterococcus Faecium NOT DETECTED NOT DETECTED   Listeria monocytogenes NOT DETECTED NOT DETECTED   Staphylococcus species DETECTED (A) NOT DETECTED    Comment: CRITICAL RESULT CALLED TO, READ BACK BY AND VERIFIED WITH: PHARMD K. AHMEND [id number] @2035  FH    Staphylococcus aureus (BCID) NOT DETECTED NOT DETECTED   Staphylococcus epidermidis DETECTED (A) NOT DETECTED    Comment: CRITICAL RESULT CALLED TO, READ BACK BY AND VERIFIED WITH: PHARMD K. AHMEND [id number] @2035  FH    Staphylococcus lugdunensis NOT DETECTED NOT DETECTED   Streptococcus species NOT DETECTED NOT DETECTED   Streptococcus agalactiae NOT DETECTED NOT DETECTED    Streptococcus pneumoniae NOT DETECTED NOT DETECTED   Streptococcus pyogenes NOT DETECTED NOT DETECTED   A.calcoaceticus-baumannii NOT DETECTED NOT DETECTED   Bacteroides fragilis NOT DETECTED NOT DETECTED   Enterobacterales NOT DETECTED NOT DETECTED   Enterobacter cloacae complex NOT DETECTED NOT DETECTED   Escherichia coli NOT DETECTED NOT DETECTED   Klebsiella aerogenes NOT DETECTED NOT DETECTED   Klebsiella oxytoca NOT DETECTED NOT DETECTED   Klebsiella pneumoniae NOT DETECTED NOT DETECTED   Proteus species NOT DETECTED NOT DETECTED   Salmonella species NOT DETECTED NOT DETECTED   Serratia marcescens NOT DETECTED NOT DETECTED   Haemophilus influenzae NOT DETECTED NOT DETECTED   Neisseria meningitidis NOT DETECTED NOT DETECTED   Pseudomonas aeruginosa NOT DETECTED NOT DETECTED   Stenotrophomonas maltophilia NOT DETECTED NOT DETECTED   Candida albicans NOT DETECTED NOT DETECTED   Candida auris NOT DETECTED NOT DETECTED   Candida glabrata NOT DETECTED NOT DETECTED   Candida krusei NOT DETECTED NOT DETECTED   Candida parapsilosis NOT DETECTED NOT DETECTED   Candida tropicalis NOT DETECTED NOT DETECTED   Cryptococcus neoformans/gattii NOT DETECTED NOT DETECTED   Methicillin resistance mecA/C NOT DETECTED NOT DETECTED    Comment: Performed at [facility], [address] [place], [place] [address]  Protime-INR     Status: None   Collection Time: 11/15/22 11:00 PM  Result Value Ref Range   Prothrombin Time 14.3 11.4 - 15.2 seconds   INR 1.1 0.8 - 1.2    Comment: (NOTE) INR goal varies based on device and disease states. Performed at [facility], [address], [place], [place] [address]   APTT     Status: Abnormal   Collection Time: 11/15/22 11:00 PM  Result Value Ref Range   aPTT <20 (L) 24 - 36 seconds    Comment: REPEATED TO VERIFY SPECIMEN CHECKED FOR CLOTS Performed at [facility], [address], [place], [place] [address]   I-Stat Lactic Acid, ED      Status: Abnormal   Collection Time: 11/15/22 11:04 PM  Result Value Ref Range   Lactic Acid, Venous 2.8 (HH) 0.5 - 1.9 mmol/L   Comment NOTIFIED PHYSICIAN   APTT     Status: Abnormal   Collection Time: 11/16/22 12:37 AM  Result Value Ref Range   aPTT <20 (L) 24 - 36 seconds    Comment: REPEATED TO VERIFY SPECIMEN CHECKED FOR CLOTS Performed at [facility] Lab, [address] [place], [place] [address]   Protime-INR     Status: None   Collection Time: 11/16/22 12:37 AM  Result Value Ref Range   Prothrombin Time 14.9 11.4 - 15.2 seconds   INR 1.1 0.8 - 1.2    Comment: (NOTE) INR goal varies based on device and disease states. Performed at [facility] Lab, [address], [place], [place] [address]   Resp panel by RT-PCR (RSV, Flu A&B, Covid) Anterior Nasal Swab     Status: None   Collection Time: 11/16/22 12:43 AM   Specimen: Anterior Nasal Swab  Result Value Ref Range   SARS Coronavirus 2 by RT PCR NEGATIVE NEGATIVE   Influenza A by PCR NEGATIVE NEGATIVE   Influenza B by PCR NEGATIVE NEGATIVE    Comment: (NOTE) The Xpert Xpress SARS-CoV-2/FLU/RSV plus assay is intended as an aid in the diagnosis of influenza from Nasopharyngeal swab specimens and should not be used as a sole basis for treatment. Nasal washings and aspirates are unacceptable for Xpert Xpress SARS-CoV-2/FLU/RSV testing.  Fact Sheet for Patients: BloggerCourse.com  Fact Sheet for Healthcare Providers: SeriousBroker.it  This test is not yet approved or cleared by the [place] FDA and has been authorized for detection and/or diagnosis of SARS-CoV-2 by FDA under an Emergency Use Authorization (EUA). This EUA will remain in effect (meaning this test can be used) for the duration of the COVID-19 declaration under Section 564(b)(1) of the Act, 21 U.S.C. section 360bbb-3(b)(1), unless the authorization is terminated or revoked.     Resp Syncytial  Virus by PCR NEGATIVE NEGATIVE    Comment: (NOTE) Fact Sheet for Patients: BloggerCourse.com  Fact Sheet for Healthcare Providers: SeriousBroker.it  This test is not yet approved or cleared by the [place] FDA and has been authorized for detection and/or diagnosis of SARS-CoV-2 by FDA under an Emergency Use Authorization (EUA). This EUA will remain in effect (meaning this test can be used) for the duration of the COVID-19 declaration under Section 564(b)(1) of the Act, 21 U.S.C. section 360bbb-3(b)(1), unless the authorization is terminated or revoked.  Performed at [facility] Lab, [address], [place], [place] [address]   Urinalysis, w/ Reflex to Culture (Infection Suspected) -Urine, Catheterized     Status: Abnormal   Collection Time: 11/16/22 12:43 AM  Result Value Ref Range   Specimen Source URINE, CATHETERIZED    Color, Urine YELLOW YELLOW   APPearance CLEAR CLEAR   Specific Gravity, Urine >1.046 (H) 1.005 - 1.030   pH 5.0 5.0 - 8.0   Glucose, UA >=500 (A) NEGATIVE mg/dL   Hgb  urine dipstick MODERATE (A) NEGATIVE   Bilirubin Urine NEGATIVE NEGATIVE   Ketones, ur NEGATIVE NEGATIVE mg/dL   Protein, ur NEGATIVE NEGATIVE mg/dL   Nitrite NEGATIVE NEGATIVE   Leukocytes,Ua NEGATIVE NEGATIVE   RBC / HPF 21-50 0 - 5 RBC/hpf   WBC, UA 0-5 0 - 5 WBC/hpf    Comment:        Reflex urine culture not performed if WBC <=10, OR if Squamous epithelial cells >5. If Squamous epithelial cells >5 suggest recollection.    Bacteria, UA RARE (A) NONE SEEN   Squamous Epithelial / HPF 0-5 0 - 5 /HPF    Comment: Performed at [facility] Lab, [address], [place], [place] [address]  Glucose, capillary     Status: Abnormal   Collection Time: 11/16/22  3:32 AM  Result Value Ref Range   Glucose-Capillary 119 (H) 70 - 99 mg/dL    Comment: Glucose reference range applies only to samples taken after fasting for at least 8 hours.   Glucose, capillary     Status: Abnormal   Collection Time: 11/16/22  4:59 AM  Result Value Ref Range   Glucose-Capillary 129 (H) 70 - 99 mg/dL    Comment: Glucose reference range applies only to samples taken after fasting for at least 8 hours.  Glucose, capillary     Status: Abnormal   Collection Time: 11/16/22  7:51 AM  Result Value Ref Range   Glucose-Capillary 182 (H) 70 - 99 mg/dL    Comment: Glucose reference range applies only to samples taken after fasting for at least 8 hours.  Glucose, capillary     Status: Abnormal   Collection Time: 11/16/22  8:23 AM  Result Value Ref Range   Glucose-Capillary 175 (H) 70 - 99 mg/dL    Comment: Glucose reference range applies only to samples taken after fasting for at least 8 hours.   Comment 1 Notify RN   Lactic acid, plasma     Status: None   Collection Time: 11/16/22  9:48 AM  Result Value Ref Range   Lactic Acid, Venous 1.2 0.5 - 1.9 mmol/L    Comment: Performed at [facility] Lab, [address] [place], [place] [address]  Glucose, capillary     Status: Abnormal   Collection Time: 11/16/22 11:43 AM  Result Value Ref Range   Glucose-Capillary 166 (H) 70 - 99 mg/dL    Comment: Glucose reference range applies only to samples taken after fasting for at least 8 hours.   Comment 1 Notify RN   Glucose, capillary     Status: Abnormal   Collection Time: 11/16/22  4:43 PM  Result Value Ref Range   Glucose-Capillary 199 (H) 70 - 99 mg/dL    Comment: Glucose reference range applies only to samples taken after fasting for at least 8 hours.   Comment 1 Notify RN   Glucose, capillary     Status: Abnormal   Collection Time: 11/16/22  7:38 PM  Result Value Ref Range   Glucose-Capillary 255 (H) 70 - 99 mg/dL    Comment: Glucose reference range applies only to samples taken after fasting for at least 8 hours.  Glucose, capillary     Status: Abnormal   Collection Time: 11/16/22 11:29 PM  Result Value Ref Range   Glucose-Capillary 199 (H) 70  - 99 mg/dL    Comment: Glucose reference range applies only to samples taken after fasting for at least 8 hours.  Glucose, capillary  Status: Abnormal   Collection Time: 11/17/22 12:09 AM  Result Value Ref Range   Glucose-Capillary 203 (H) 70 - 99 mg/dL    Comment: Glucose reference range applies only to samples taken after fasting for at least 8 hours.  CBC     Status: Abnormal   Collection Time: 11/17/22  3:27 AM  Result Value Ref Range   WBC 10.2 4.0 - 10.5 K/uL   RBC 3.63 (L) 4.22 - 5.81 MIL/uL   Hemoglobin 12.1 (L) 13.0 - 17.0 g/dL   HCT 29.5 (L) 62.1 - 30.8 %   MCV 98.9 80.0 - 100.0 fL   MCH 33.3 26.0 - 34.0 pg   MCHC 33.7 30.0 - 36.0 g/dL   RDW 65.7 84.6 - 96.2 %   Platelets 119 (L) 150 - 400 K/uL   nRBC 0.0 0.0 - 0.2 %    Comment: Performed at [facility], [address] [place], [place] [address]  Basic metabolic panel     Status: Abnormal   Collection Time: 11/17/22  3:27 AM  Result Value Ref Range   Sodium 138 135 - 145 mmol/L   Potassium 4.2 3.5 - 5.1 mmol/L   Chloride 111 98 - 111 mmol/L   CO2 21 (L) 22 - 32 mmol/L   Glucose, Bld 194 (H) 70 - 99 mg/dL    Comment: Glucose reference range applies only to samples taken after fasting for at least 8 hours.   BUN 19 8 - 23 mg/dL   Creatinine, Ser 1.32 0.61 - 1.24 mg/dL   Calcium 8.7 (L) 8.9 - 10.3 mg/dL   GFR, Estimated >44 >01 mL/min    Comment: (NOTE) Calculated using the CKD-EPI Creatinine Equation (2021)    Anion gap 6 5 - 15    Comment: Performed at [facility] Lab, [address] [place], [place] [address]  Magnesium     Status: None   Collection Time: 11/17/22  3:27 AM  Result Value Ref Range   Magnesium 2.1 1.7 - 2.4 mg/dL    Comment: Performed at [facility], [address], [place], [place] [address]  Phosphorus     Status: None   Collection Time: 11/17/22  3:27 AM  Result Value Ref Range   Phosphorus 2.5 2.5 - 4.6 mg/dL    Comment: Performed at [facility], [address] [place], [place] [address]  Glucose, capillary     Status: Abnormal   Collection Time: 11/17/22  4:11 AM  Result Value Ref Range   Glucose-Capillary 182 (H) 70 - 99 mg/dL    Comment: Glucose reference range applies only to samples taken after fasting for at least 8 hours.  Glucose, capillary     Status: Abnormal   Collection Time: 11/17/22  8:08 AM  Result Value Ref Range   Glucose-Capillary 307 (H) 70 - 99 mg/dL    Comment: Glucose reference range applies only to samples taken after fasting for at least 8 hours.  Glucose, capillary     Status: Abnormal   Collection Time: 11/17/22 11:28 AM  Result Value Ref Range   Glucose-Capillary 240 (H) 70 - 99 mg/dL    Comment: Glucose reference range applies only to samples taken after fasting for at least 8 hours.  Glucose, capillary     Status: Abnormal   Collection Time: 11/17/22  3:18 PM  Result Value Ref Range   Glucose-Capillary 235 (H) 70 - 99 mg/dL    Comment: Glucose reference range  applies only to samples taken after fasting for at least 8 hours.    MICRO:  IMAGING: DG Abd 1 View  Result Date: 11/16/2022 CLINICAL DATA:  Ureteral stent placement EXAM: ABDOMEN - 1 VIEW COMPARISON:  Abdominal CT from yesterday FINDINGS: Five intra procedural fluoroscopic images were submitted. Partial duplication of the left ureter with hydronephrosis and obstructing stone affecting the lower pole moiety which was stented. Filling defect is seen at the lower pole calices from stone. IMPRESSION: Partial duplication of the left ureter with obstructed lower pole moiety affected by nephrolithiasis. Ureteral stent placement. Electronically Signed   By: Tiburcio Pea M.D.   On: 11/16/2022 05:04   DG C-Arm 1-60 Min-No Report  Result Date: 11/16/2022 Fluoroscopy was utilized by the requesting physician.  No radiographic interpretation.   CT ABDOMEN PELVIS W CONTRAST  Result Date: 11/16/2022 CLINICAL DATA:  Acute nonlocalized abdominal pain, groin pain,  prostate cancer EXAM: CT ABDOMEN AND PELVIS WITH CONTRAST TECHNIQUE: Multidetector CT imaging of the abdomen and pelvis was performed using the standard protocol following bolus administration of intravenous contrast. RADIATION DOSE REDUCTION: This exam was performed according to the departmental dose-optimization program which includes automated exposure control, adjustment of the mA and/or kV according to patient size and/or use of iterative reconstruction technique. CONTRAST:  75mL OMNIPAQUE IOHEXOL 350 MG/ML SOLN COMPARISON:  07/26/2019 FINDINGS: Lower chest: No acute abnormality. Extensive multi-vessel coronary artery calcification. Hepatobiliary: The gallbladder is distended but no pericholecystic inflammatory changes are identified. The liver is unremarkable. No intra or extrahepatic biliary ductal dilation identified. Pancreas: Unremarkable Spleen: Unremarkable Adrenals/Urinary Tract: The adrenal glands are unremarkable. The kidneys are normal in size and position. There is mild to moderate left hydronephrosis and delayed left renal cortical enhancement secondary to an obstructing 5 mm calculus at the left ureterovesicular junction. Superimposed 19 mm nonobstructing calculus is seen within the lower pole the left kidney. Moderate left perinephric stranding is present, possibly related to forniceal rupture or superimposed infection. No nephro or urolithiasis on the right. No hydronephrosis on the right. Punctate 1-2 mm layering calculi are seen within the bladder lumen. The bladder is otherwise unremarkable. Stomach/Bowel: Stomach is unremarkable. Moderate supraumbilical ventral hernia is present containing a single loop of unremarkable mid small bowel. The cecum is hypermobile and is displaced into the right upper quadrant. The small and large bowel are otherwise unremarkable and there is no evidence of obstruction or focal inflammation. The appendix is absent. No free intraperitoneal gas or fluid.  Vascular/Lymphatic: Extensive aortoiliac atherosclerotic calcification without evidence of aneurysm. No pathologic adenopathy within the abdomen and pelvis. Reproductive: Prostate is unremarkable. Other: None significant Musculoskeletal: No acute bone abnormality. IMPRESSION: 1. Mild to moderate left hydronephrosis and delayed left renal cortical enhancement secondary to an obstructing 5 mm calculus at the left ureterovesicular junction. Superimposed 19 mm nonobstructing calculus within the lower pole the left kidney. 2. Moderate left perinephric stranding, possibly related to forniceal rupture or superimposed infection. Correlation with urinalysis and urine culture is recommended for further evaluation. 3. Punctate 1-2 mm layering calculi within the bladder lumen. 4. Extensive multi-vessel coronary artery calcification. 5. Moderate supraumbilical ventral hernia containing a single loop of unremarkable mid small bowel. Aortic Atherosclerosis (ICD10-I70.0). Electronically Signed   By: Helyn Numbers M.D.   On: 11/16/2022 00:14   DG Chest 2 View  Result Date: 11/15/2022 CLINICAL DATA:  Left flank pain possible sepsis EXAM: CHEST - 2 VIEW COMPARISON:  06/23/2019 FINDINGS: No acute airspace disease or pleural effusion. Normal cardiomediastinal silhouette.  No pneumothorax. Air distended bowel in the upper abdomen. IMPRESSION: No active cardiopulmonary disease. Air distended bowel in the upper abdomen. Electronically Signed   By: Jasmine Pang M.D.   On: 11/15/2022 22:57    HISTORICAL MICRO/IMAGING  Assessment/Plan:   62yo M with septic left ureteral stone/pyelonephritis with secondary staphylococcal epi bacteremia - continue on cefazolin - will follow up on sensitivities and ask micro to see if this is all the same isolate -  recommend to repeat blood cx today -  please get TTE - if isolated bacteremia then can transition to orals  Shane Duthie B. Drue Second MD MPH [facility] [phone]

## 2022-11-17 NOTE — Plan of Care (Signed)

## 2022-11-17 NOTE — Progress Notes (Signed)
PROGRESS NOTE  Shane Bond:[id number] DOB: Mar 12, 1960 DOA: 11/15/2022 PCP: [name], NP   LOS: 1 day   Brief Narrative / Interim history: 62 year old male with history of neurogenic bladder, prior CVA, performs intermittent self-cath at home, comes into the hospital with nausea, vomiting, fever, left-sided flank pain.  He was found to have mild to moderate left hydronephrosis due to an obstructing 5 mm calculus at the left UV junction, also superimposed 19 mm nonobstructive calculus within the lower lobe of the left kidney.  He also has a left perinephric stranding.  Urology was consulted, he was taken to the OR on 9/25 status post stenting.  He was monitored in the ICU periprocedurally, never required pressors.  He was transferred to the hospitalist service 9/26.  Blood cultures speciated 4/4 bottles staph epi.  Urine cultures did not appear to have been sent  Subjective / 24h Interval events: He is feeling great this morning, asking to go home  Assesement and Plan: Principal problem Sepsis due to Staphylococcus epidermidis bacteremia, obstructive nephrolithiasis -he met sepsis criteria on admission with fever of 103, hypotension, source.  Lactic acid was not elevated and he did not drink for pressors in the ICU -For now continue Ancef, ID consulted, appreciate input -2D echo pending -He was taken to the OR on 9/25 status post cystoscopy and left ureteral stent placement.  Urology will follow as an outpatient, currently has a stent in place.  He will need definitive management for his nephrolithiasis once infection is treated  Active problems Neurogenic bladder -for now has a Foley catheter.  He self caths at home several times per day, use a special type of catheter that we do not carry in the hospital.  Prior CVA -resume home aspirin  Essential hypertension-hold home agents due to soft blood pressures  History of DM2, with hyperglycemia-continue sliding scale, resume home  long-acting insulin at a lower dose  Lab Results  Component Value Date   HGBA1C 6.9 (A) 08/15/2022   CBG (last 3)  Recent Labs    11/17/22 0009 11/17/22 0411 11/17/22 0808  GLUCAP 203* 182* 307*    Scheduled Meds:  Aspirin  1 tablet Oral Daily   Chlorhexidine Gluconate Cloth  6 each Topical Daily   [START ON 11/18/2022] dapagliflozin propanediol  10 mg Oral QAC breakfast   ezetimibe  10 mg Oral Daily   heparin  5,000 Units Subcutaneous Q8H   insulin aspart  0-9 Units Subcutaneous TID AC & HS   insulin glargine-yfgn  20 Units Subcutaneous BID   nicotine  21 mg Transdermal Daily   Continuous Infusions:  sodium chloride 10 mL/hr at 11/17/22 0700    ceFAZolin (ANCEF) IV 2 g (11/17/22 0523)   lactated ringers     metronidazole 500 mg (11/16/22 2346)   PRN Meds:.docusate sodium, ondansetron (ZOFRAN) IV, polyethylene glycol  Current Outpatient Medications  Medication Instructions   Accu-Chek Softclix Lancets lancets Use as instructed to check blood glucose level by fingerstick twice per day.   Aspirin 81 MG CAPS 1 tablet, Oral, Daily   Blood Glucose Monitoring Suppl (ACCU-CHEK GUIDE) w/Device KIT Use as instructed to check blood glucose level by fingerstick twice per day   ezetimibe (ZETIA) 10 mg, Oral, Daily   Farxiga 10 mg, Oral, Daily before breakfast   glucose blood (ACCU-CHEK GUIDE) test strip Use as instructed to check blood glucose by fingerstick twice per day.   Insulin Pen Needle (TECHLITE PEN NEEDLES) 32G X 4 MM MISC Inject  as directed 2 (two) times daily.   Lantus SoloStar 40 Units, Subcutaneous, 2 times daily   losartan (COZAAR) 25 mg, Oral, Daily   Misc. Devices (CAREX COCCYX CUSHION) MISC Use daily when sitting for pressure relief ICD10 L98.429   Misc. Devices MISC Please provide patient with standard sized 55F urinary catheters for self cathing along with lubricant.   Multiple Vitamin (MULTIVITAMIN ADULT PO) 1 tablet, Oral, Daily   omega-3 acid ethyl esters  (LOVAZA) 2 g, Oral, 2 times daily, Please fill as a 90 day supply   Ozempic (0.25 or 0.5 MG/DOSE) 0.5 mg, Subcutaneous, Weekly   phenazopyridine (PYRIDIUM) 100 mg, Oral, 3 times daily PRN   triamcinolone cream (KENALOG) 0.1 % 1 Application, Topical, 2 times daily, Apply to both hands    Diet Orders (From admission, onward)     Start     Ordered   11/16/22 0535  Diet regular Room service appropriate? Yes; Fluid consistency: Thin  Diet effective now       Question Answer Comment  Room service appropriate? Yes   Fluid consistency: Thin      11/16/22 0536            DVT prophylaxis: heparin injection 5,000 Units Start: 11/17/22 0600 SCDs Start: 11/16/22 0534   Lab Results  Component Value Date   PLT 119 (L) 11/17/2022      Code Status: Full Code  Family Communication: no family at bedside   Status is: Inpatient Remains inpatient appropriate because: severity of illness  Level of care: Med-Surg  Consultants:  ID Urology PCCM  Objective: Vitals:   11/16/22 1529 11/16/22 1935 11/17/22 0411 11/17/22 0813  BP: 130/63 115/66 115/62 102/61  Pulse: (!) 56 (!) 52 (!) 49 63  Resp: 16 19 18 16   Temp: 98.1 F (36.7 C) 98.3 F (36.8 C) 97.6 F (36.4 C) 97.9 F (36.6 C)  TempSrc: Oral Oral Oral   SpO2: 99% 94% 96% 96%  Weight:      Height:        Intake/Output Summary (Last 24 hours) at 11/17/2022 1025 Last data filed at 11/17/2022 4540 Gross per 24 hour  Intake 572.49 ml  Output 2415 ml  Net -1842.51 ml   Wt Readings from Last 3 Encounters:  11/15/22 110.2 kg  09/12/22 111.1 kg  08/15/22 109.4 kg    Examination:  Constitutional: NAD Eyes: no scleral icterus ENMT: Mucous membranes are moist.  Neck: normal, supple Respiratory: clear to auscultation bilaterally, no wheezing, no crackles.  Cardiovascular: Regular rate and rhythm, no murmurs / rubs / gallops. No LE edema.  Abdomen: non distended, no tenderness. Bowel sounds positive.  Musculoskeletal: no  clubbing / cyanosis.   Data Reviewed: I have independently reviewed following labs and imaging studies   CBC Recent Labs  Lab 11/15/22 2212 11/17/22 0327  WBC 5.1 10.2  HGB 13.6 12.1*  HCT 40.9 35.9*  PLT 153 119*  MCV 95.8 98.9  MCH 31.9 33.3  MCHC 33.3 33.7  RDW 13.5 13.9  LYMPHSABS 0.4*  --   MONOABS 0.1  --   EOSABS 0.0  --   BASOSABS 0.0  --     Recent Labs  Lab 11/15/22 2212 11/15/22 2300 11/15/22 2304 11/16/22 0037 11/16/22 0948 11/17/22 0327  NA 138  --   --   --   --  138  K 3.7  --   --   --   --  4.2  CL 109  --   --   --   --  111  CO2 19*  --   --   --   --  21*  GLUCOSE 142*  --   --   --   --  194*  BUN 21  --   --   --   --  19  CREATININE 1.06  --   --   --   --  0.97  CALCIUM 8.8*  --   --   --   --  8.7*  AST 38  --   --   --   --   --   ALT 49*  --   --   --   --   --   ALKPHOS 91  --   --   --   --   --   BILITOT 0.5  --   --   --   --   --   ALBUMIN 3.4*  --   --   --   --   --   MG  --   --   --   --   --  2.1  LATICACIDVEN  --   --  2.8*  --  1.2  --   INR  --  1.1  --  1.1  --   --     ------------------------------------------------------------------------------------------------------------------ No results for input(s): "CHOL", "HDL", "LDLCALC", "TRIG", "CHOLHDL", "LDLDIRECT" in the last 72 hours.  Lab Results  Component Value Date   HGBA1C 6.9 (A) 08/15/2022   ------------------------------------------------------------------------------------------------------------------ No results for input(s): "TSH", "T4TOTAL", "T3FREE", "THYROIDAB" in the last 72 hours.  Invalid input(s): "FREET3"  Cardiac Enzymes No results for input(s): "CKMB", "TROPONINI", "MYOGLOBIN" in the last 168 hours.  Invalid input(s): "CK" ------------------------------------------------------------------------------------------------------------------    Component Value Date/Time   BNP 147.2 (H) 06/22/2019 1411    CBG: Recent Labs  Lab  11/16/22 1938 11/16/22 2329 11/17/22 0009 11/17/22 0411 11/17/22 0808  GLUCAP 255* 199* 203* 182* 307*    Recent Results (from the past 240 hour(s))  Culture, blood (Routine x 2)     Status: None (Preliminary result)   Collection Time: 11/15/22 10:12 PM   Specimen: BLOOD  Result Value Ref Range Status   Specimen Description BLOOD SITE NOT SPECIFIED  Final   Special Requests   Final    BOTTLES DRAWN AEROBIC AND ANAEROBIC Blood Culture results may not be optimal due to an excessive volume of blood received in culture bottles   Culture  Setup Time   Final    GRAM POSITIVE COCCI IN BOTH AEROBIC AND ANAEROBIC BOTTLES CRITICAL RESULT CALLED TO, READ BACK BY AND VERIFIED WITH: Wynn Banker [id number] @2035  FH Performed at [facility], [address] [place], [place] [address]    Culture GRAM POSITIVE COCCI  Final   Report Status PENDING  Incomplete  Culture, blood (Routine x 2)     Status: None (Preliminary result)   Collection Time: 11/15/22 10:25 PM   Specimen: BLOOD  Result Value Ref Range Status   Specimen Description BLOOD SITE NOT SPECIFIED  Final   Special Requests   Final    BOTTLES DRAWN AEROBIC AND ANAEROBIC Blood Culture adequate volume   Culture  Setup Time   Final    GRAM POSITIVE COCCI IN CLUSTERS IN BOTH AEROBIC AND ANAEROBIC BOTTLES CRITICAL RESULT CALLED TO, READ BACK BY AND VERIFIED WITH: Wynn Banker [id number] @2035  FH Performed at [facility], [address], [place], [place] [address]    Culture GRAM POSITIVE  COCCI  Final   Report Status PENDING  Incomplete  Blood Culture ID Panel (Reflexed)     Status: Abnormal   Collection Time: 11/15/22 10:25 PM  Result Value Ref Range Status   Enterococcus faecalis NOT DETECTED NOT DETECTED Final   Enterococcus Faecium NOT DETECTED NOT DETECTED Final   Listeria monocytogenes NOT DETECTED NOT DETECTED Final   Staphylococcus species DETECTED (A) NOT DETECTED Final    Comment: CRITICAL RESULT CALLED TO,  READ BACK BY AND VERIFIED WITH: PHARMD K. AHMEND [id number] @2035  FH    Staphylococcus aureus (BCID) NOT DETECTED NOT DETECTED Final   Staphylococcus epidermidis DETECTED (A) NOT DETECTED Final    Comment: CRITICAL RESULT CALLED TO, READ BACK BY AND VERIFIED WITH: PHARMD K. AHMEND [id number] @2035  FH    Staphylococcus lugdunensis NOT DETECTED NOT DETECTED Final   Streptococcus species NOT DETECTED NOT DETECTED Final   Streptococcus agalactiae NOT DETECTED NOT DETECTED Final   Streptococcus pneumoniae NOT DETECTED NOT DETECTED Final   Streptococcus pyogenes NOT DETECTED NOT DETECTED Final   A.calcoaceticus-baumannii NOT DETECTED NOT DETECTED Final   Bacteroides fragilis NOT DETECTED NOT DETECTED Final   Enterobacterales NOT DETECTED NOT DETECTED Final   Enterobacter cloacae complex NOT DETECTED NOT DETECTED Final   Escherichia coli NOT DETECTED NOT DETECTED Final   Klebsiella aerogenes NOT DETECTED NOT DETECTED Final   Klebsiella oxytoca NOT DETECTED NOT DETECTED Final   Klebsiella pneumoniae NOT DETECTED NOT DETECTED Final   Proteus species NOT DETECTED NOT DETECTED Final   Salmonella species NOT DETECTED NOT DETECTED Final   Serratia marcescens NOT DETECTED NOT DETECTED Final   Haemophilus influenzae NOT DETECTED NOT DETECTED Final   Neisseria meningitidis NOT DETECTED NOT DETECTED Final   Pseudomonas aeruginosa NOT DETECTED NOT DETECTED Final   Stenotrophomonas maltophilia NOT DETECTED NOT DETECTED Final   Candida albicans NOT DETECTED NOT DETECTED Final   Candida auris NOT DETECTED NOT DETECTED Final   Candida glabrata NOT DETECTED NOT DETECTED Final   Candida krusei NOT DETECTED NOT DETECTED Final   Candida parapsilosis NOT DETECTED NOT DETECTED Final   Candida tropicalis NOT DETECTED NOT DETECTED Final   Cryptococcus neoformans/gattii NOT DETECTED NOT DETECTED Final   Methicillin resistance mecA/C NOT DETECTED NOT DETECTED Final    Comment: Performed at [facility], [address] [place], [place] [address]  Resp panel by RT-PCR (RSV, Flu A&B, Covid) Anterior Nasal Swab     Status: None   Collection Time: 11/16/22 12:43 AM   Specimen: Anterior Nasal Swab  Result Value Ref Range Status   SARS Coronavirus 2 by RT PCR NEGATIVE NEGATIVE Final   Influenza A by PCR NEGATIVE NEGATIVE Final   Influenza B by PCR NEGATIVE NEGATIVE Final    Comment: (NOTE) The Xpert Xpress SARS-CoV-2/FLU/RSV plus assay is intended as an aid in the diagnosis of influenza from Nasopharyngeal swab specimens and should not be used as a sole basis for treatment. Nasal washings and aspirates are unacceptable for Xpert Xpress SARS-CoV-2/FLU/RSV testing.  Fact Sheet for Patients: BloggerCourse.com  Fact Sheet for Healthcare Providers: SeriousBroker.it  This test is not yet approved or cleared by the [place] FDA and has been authorized for detection and/or diagnosis of SARS-CoV-2 by FDA under an Emergency Use Authorization (EUA). This EUA will remain in effect (meaning this test can be used) for the duration of the COVID-19 declaration under Section 564(b)(1) of the Act, 21 U.S.C. section 360bbb-3(b)(1), unless the authorization is terminated or revoked.  Resp Syncytial Virus by PCR NEGATIVE NEGATIVE Final    Comment: (NOTE) Fact Sheet for Patients: BloggerCourse.com  Fact Sheet for Healthcare Providers: SeriousBroker.it  This test is not yet approved or cleared by the [place] FDA and has been authorized for detection and/or diagnosis of SARS-CoV-2 by FDA under an Emergency Use Authorization (EUA). This EUA will remain in effect (meaning this test can be used) for the duration of the COVID-19 declaration under Section 564(b)(1) of the Act, 21 U.S.C. section 360bbb-3(b)(1), unless the authorization is terminated or revoked.  Performed at [facility], [address] [place], [place] [address]      Radiology Studies: No results found.   Pamella Pert, MD, PhD [facility]  Between 7 am - 7 pm I am available, please contact me via Amion (for emergencies) or Securechat (non urgent messages)  Between 7 pm - 7 am I am not available, please contact night coverage MD/APP via Amion

## 2022-11-17 NOTE — Plan of Care (Signed)

## 2022-11-18 ENCOUNTER — Inpatient Hospital Stay (HOSPITAL_COMMUNITY): Payer: Medicaid Other

## 2022-11-18 DIAGNOSIS — N201 Calculus of ureter: Secondary | ICD-10-CM | POA: Diagnosis not present

## 2022-11-18 DIAGNOSIS — B958 Unspecified staphylococcus as the cause of diseases classified elsewhere: Secondary | ICD-10-CM

## 2022-11-18 DIAGNOSIS — R7881 Bacteremia: Secondary | ICD-10-CM | POA: Insufficient documentation

## 2022-11-18 DIAGNOSIS — B957 Other staphylococcus as the cause of diseases classified elsewhere: Secondary | ICD-10-CM | POA: Insufficient documentation

## 2022-11-18 LAB — COMPREHENSIVE METABOLIC PANEL
ALT: 33 U/L (ref 0–44)
AST: 24 U/L (ref 15–41)
Albumin: 3.2 g/dL — ABNORMAL LOW (ref 3.5–5.0)
Alkaline Phosphatase: 67 U/L (ref 38–126)
Anion gap: 7 (ref 5–15)
BUN: 16 mg/dL (ref 8–23)
CO2: 24 mmol/L (ref 22–32)
Calcium: 9.2 mg/dL (ref 8.9–10.3)
Chloride: 109 mmol/L (ref 98–111)
Creatinine, Ser: 0.94 mg/dL (ref 0.61–1.24)
GFR, Estimated: >60 mL/min (ref >60)
Glucose, Bld: 129 mg/dL — ABNORMAL HIGH (ref 70–99)
Potassium: 4.3 mmol/L (ref 3.5–5.1)
Sodium: 140 mmol/L (ref 135–145)
Total Bilirubin: 0.3 mg/dL (ref 0.3–1.2)
Total Protein: 6.8 g/dL (ref 6.5–8.1)

## 2022-11-18 LAB — ECHOCARDIOGRAM COMPLETE
AR max vel: 3.74 cm2
AV Peak grad: 5.7 mm[Hg]
Ao pk vel: 1.19 m/s
Area-P 1/2: 4.07 cm2
Height: 73 in
MV M vel: 4.1 m/s
MV Peak grad: 67.2 mm[Hg]
S' Lateral: 3.2 cm
Weight: 3855.4 [oz_av]

## 2022-11-18 LAB — GLUCOSE, CAPILLARY
Glucose-Capillary: 226 mg/dL — ABNORMAL HIGH (ref 70–99)
Glucose-Capillary: 146 mg/dL — ABNORMAL HIGH (ref 70–99)
Glucose-Capillary: 176 mg/dL — ABNORMAL HIGH (ref 70–99)
Glucose-Capillary: 157 mg/dL — ABNORMAL HIGH (ref 70–99)

## 2022-11-18 LAB — CBC
HCT: 40.1 % (ref 39.0–52.0)
Hemoglobin: 13.4 g/dL (ref 13.0–17.0)
MCH: 32.5 pg (ref 26.0–34.0)
MCHC: 33.4 g/dL (ref 30.0–36.0)
MCV: 97.3 fL (ref 80.0–100.0)
Platelets: 156 10*3/uL (ref 150–400)
RBC: 4.12 MIL/uL — ABNORMAL LOW (ref 4.22–5.81)
RDW: 13.3 % (ref 11.5–15.5)
WBC: 8.2 10*3/uL (ref 4.0–10.5)
nRBC: 0 % (ref 0.0–0.2)

## 2022-11-18 LAB — MAGNESIUM: Magnesium: 1.9 mg/dL (ref 1.7–2.4)

## 2022-11-18 MED ORDER — PERFLUTREN LIPID MICROSPHERE
1.0000 mL | INTRAVENOUS | Status: AC | PRN
  Administered 2022-11-18: 4 mL via INTRAVENOUS
  Filled 2022-11-18: qty 10

## 2022-11-18 NOTE — Progress Notes (Signed)
   11/18/22 1630  Mobility  Activity Ambulated independently in hallway  Level of Assistance Standby assist, set-up cues, supervision of patient - no hands on  Assistive Device None  Distance Ambulated (ft) 500 ft  Activity Response Tolerated well  Mobility Referral Yes  $Mobility charge 1 Mobility  Mobility Specialist Start Time (ACUTE ONLY) 1558  Mobility Specialist Stop Time (ACUTE ONLY) 1607  Mobility Specialist Time Calculation (min) (ACUTE ONLY) 9 min   Mobility Specialist: Progress Note  Pt agreeable to mobility session - received in bed. Required SV throughout with no AD and slight drifting L to R. Pt was asymptomatic throughout ambulation and returned to room w/o fault. Pt returned to bed with all needs met - call bell within reach.   Barnie Mort, BS Mobility Specialist Please contact via SecureChat or Rehab office at [phone].

## 2022-11-18 NOTE — Plan of Care (Signed)

## 2022-11-18 NOTE — Progress Notes (Signed)
[facility]  Date of Admission:  11/15/2022     Total days of antibiotics 4         ASSESSMENT:  Shane Bond TTE was without evidence of vegetation. Blood cultures drawn this morning are pending. Have asked microbiology for sensitivities of previous Staphylococcus epidermidis to aid in determination of contamination versus true infection. Discussed plan of care to continue with current dose of Cefazolin pending additional microbiology results and clearance of potential bacteremia. Remaining medical and supportive care per Internal Medicine.   PLAN:  Continue current dose of Cefazolin.  Monitor cultures for clearance of bacteremia and sensitivities of previous Staph Epi Remaining medical and supportive care per Internal Medicine.  Dr. Daiva Eves will be available over the weekend for ID related questions.   Principal Problem:   Ureteral calculus, left Active Problems:   Staphylococcus epidermidis bacteremia    aspirin  81 mg Oral Daily   Chlorhexidine Gluconate Cloth  6 each Topical Daily   dapagliflozin propanediol  10 mg Oral QAC breakfast   ezetimibe  10 mg Oral Daily   heparin  5,000 Units Subcutaneous Q8H   insulin aspart  0-9 Units Subcutaneous TID AC & HS   insulin glargine-yfgn  20 Units Subcutaneous BID   nicotine  21 mg Transdermal Daily    SUBJECTIVE:  Afebrile with no acute events. No new concerns/complaints.   Allergies  Allergen Reactions   Lipitor [Atorvastatin] Rash   Lisinopril Rash     Review of Systems: Review of Systems  Constitutional:  Negative for chills, fever and weight loss.  Respiratory:  Negative for cough, shortness of breath and wheezing.   Cardiovascular:  Negative for chest pain and leg swelling.  Gastrointestinal:  Negative for abdominal pain, constipation, diarrhea, nausea and vomiting.  Skin:  Negative for rash.      OBJECTIVE: Vitals:   11/17/22 2017 11/18/22 0440 11/18/22 0450 11/18/22 0824   BP: 133/66 125/69  (!) 110/56  Pulse: (!) 56 (!) 49  71  Resp: 18 19  16   Temp: 97.8 F (36.6 C) 97.8 F (36.6 C)  97.9 F (36.6 C)  TempSrc: Oral Oral    SpO2: 96% 97%  98%  Weight:   109.3 kg   Height:       Body mass index is 31.79 kg/m.  Physical Exam Constitutional:      General: He is not in acute distress.    Appearance: He is well-developed.  Cardiovascular:     Rate and Rhythm: Normal rate and regular rhythm.     Heart sounds: Normal heart sounds.  Pulmonary:     Effort: Pulmonary effort is normal.     Breath sounds: Normal breath sounds.  Skin:    General: Skin is warm and dry.  Neurological:     Mental Status: He is alert.  Psychiatric:        Mood and Affect: Mood normal.     Lab Results Lab Results  Component Value Date   WBC 8.2 11/18/2022   HGB 13.4 11/18/2022   HCT 40.1 11/18/2022   MCV 97.3 11/18/2022   PLT 156 11/18/2022    Lab Results  Component Value Date   CREATININE 0.97 11/17/2022   BUN 19 11/17/2022   NA 138 11/17/2022   K 4.2 11/17/2022   CL 111 11/17/2022   CO2 21 (L) 11/17/2022    Lab Results  Component Value Date   ALT 49 (H) 11/15/2022   AST 38  11/15/2022   ALKPHOS 91 11/15/2022   BILITOT 0.5 11/15/2022     Microbiology: Recent Results (from the past 240 hour(s))  Culture, blood (Routine x 2)     Status: Abnormal (Preliminary result)   Collection Time: 11/15/22 10:12 PM   Specimen: BLOOD  Result Value Ref Range Status   Specimen Description BLOOD SITE NOT SPECIFIED  Final   Special Requests   Final    BOTTLES DRAWN AEROBIC AND ANAEROBIC Blood Culture results may not be optimal due to an excessive volume of blood received in culture bottles   Culture  Setup Time   Final    GRAM POSITIVE COCCI IN BOTH AEROBIC AND ANAEROBIC BOTTLES CRITICAL RESULT CALLED TO, READ BACK BY AND VERIFIED WITH: PHARMD K. AHMEND [id number] @2035  FH    Culture (A)  Final    STAPHYLOCOCCUS EPIDERMIDIS SUSCEPTIBILITIES TO FOLLOW Performed at  [facility], [address] [place], [place] [address]    Report Status PENDING  Incomplete  Culture, blood (Routine x 2)     Status: Abnormal (Preliminary result)   Collection Time: 11/15/22 10:25 PM   Specimen: BLOOD  Result Value Ref Range Status   Specimen Description BLOOD SITE NOT SPECIFIED  Final   Special Requests   Final    BOTTLES DRAWN AEROBIC AND ANAEROBIC Blood Culture adequate volume   Culture  Setup Time   Final    GRAM POSITIVE COCCI IN CLUSTERS IN BOTH AEROBIC AND ANAEROBIC BOTTLES CRITICAL RESULT CALLED TO, READ BACK BY AND VERIFIED WITH: PHARMD K. AHMEND [id number] @2035  FH    Culture (A)  Final    STAPHYLOCOCCUS EPIDERMIDIS SUSCEPTIBILITIES TO FOLLOW Performed at [facility], [address], [place], [place] [address]    Report Status PENDING  Incomplete  Blood Culture ID Panel (Reflexed)     Status: Abnormal   Collection Time: 11/15/22 10:25 PM  Result Value Ref Range Status   Enterococcus faecalis NOT DETECTED NOT DETECTED Final   Enterococcus Faecium NOT DETECTED NOT DETECTED Final   Listeria monocytogenes NOT DETECTED NOT DETECTED Final   Staphylococcus species DETECTED (A) NOT DETECTED Final    Comment: CRITICAL RESULT CALLED TO, READ BACK BY AND VERIFIED WITH: PHARMD K. AHMEND [id number] @2035  FH    Staphylococcus aureus (BCID) NOT DETECTED NOT DETECTED Final   Staphylococcus epidermidis DETECTED (A) NOT DETECTED Final    Comment: CRITICAL RESULT CALLED TO, READ BACK BY AND VERIFIED WITH: PHARMD K. AHMEND [id number] @2035  FH    Staphylococcus lugdunensis NOT DETECTED NOT DETECTED Final   Streptococcus species NOT DETECTED NOT DETECTED Final   Streptococcus agalactiae NOT DETECTED NOT DETECTED Final   Streptococcus pneumoniae NOT DETECTED NOT DETECTED Final   Streptococcus pyogenes NOT DETECTED NOT DETECTED Final   A.calcoaceticus-baumannii NOT DETECTED NOT DETECTED Final   Bacteroides fragilis NOT DETECTED NOT DETECTED Final   Enterobacterales  NOT DETECTED NOT DETECTED Final   Enterobacter cloacae complex NOT DETECTED NOT DETECTED Final   Escherichia coli NOT DETECTED NOT DETECTED Final   Klebsiella aerogenes NOT DETECTED NOT DETECTED Final   Klebsiella oxytoca NOT DETECTED NOT DETECTED Final   Klebsiella pneumoniae NOT DETECTED NOT DETECTED Final   Proteus species NOT DETECTED NOT DETECTED Final   Salmonella species NOT DETECTED NOT DETECTED Final   Serratia marcescens NOT DETECTED NOT DETECTED Final   Haemophilus influenzae NOT DETECTED NOT DETECTED Final   Neisseria meningitidis NOT DETECTED NOT DETECTED Final   Pseudomonas aeruginosa NOT DETECTED NOT DETECTED Final  Stenotrophomonas maltophilia NOT DETECTED NOT DETECTED Final   Candida albicans NOT DETECTED NOT DETECTED Final   Candida auris NOT DETECTED NOT DETECTED Final   Candida glabrata NOT DETECTED NOT DETECTED Final   Candida krusei NOT DETECTED NOT DETECTED Final   Candida parapsilosis NOT DETECTED NOT DETECTED Final   Candida tropicalis NOT DETECTED NOT DETECTED Final   Cryptococcus neoformans/gattii NOT DETECTED NOT DETECTED Final   Methicillin resistance mecA/C NOT DETECTED NOT DETECTED Final    Comment: Performed at [facility], [address] [place], [place] [address]  Resp panel by RT-PCR (RSV, Flu A&B, Covid) Anterior Nasal Swab     Status: None   Collection Time: 11/16/22 12:43 AM   Specimen: Anterior Nasal Swab  Result Value Ref Range Status   SARS Coronavirus 2 by RT PCR NEGATIVE NEGATIVE Final   Influenza A by PCR NEGATIVE NEGATIVE Final   Influenza B by PCR NEGATIVE NEGATIVE Final    Comment: (NOTE) The Xpert Xpress SARS-CoV-2/FLU/RSV plus assay is intended as an aid in the diagnosis of influenza from Nasopharyngeal swab specimens and should not be used as a sole basis for treatment. Nasal washings and aspirates are unacceptable for Xpert Xpress SARS-CoV-2/FLU/RSV testing.  Fact Sheet for  Patients: BloggerCourse.com  Fact Sheet for Healthcare Providers: SeriousBroker.it  This test is not yet approved or cleared by the [place] FDA and has been authorized for detection and/or diagnosis of SARS-CoV-2 by FDA under an Emergency Use Authorization (EUA). This EUA will remain in effect (meaning this test can be used) for the duration of the COVID-19 declaration under Section 564(b)(1) of the Act, 21 U.S.C. section 360bbb-3(b)(1), unless the authorization is terminated or revoked.     Resp Syncytial Virus by PCR NEGATIVE NEGATIVE Final    Comment: (NOTE) Fact Sheet for Patients: BloggerCourse.com  Fact Sheet for Healthcare Providers: SeriousBroker.it  This test is not yet approved or cleared by the [place] FDA and has been authorized for detection and/or diagnosis of SARS-CoV-2 by FDA under an Emergency Use Authorization (EUA). This EUA will remain in effect (meaning this test can be used) for the duration of the COVID-19 declaration under Section 564(b)(1) of the Act, 21 U.S.C. section 360bbb-3(b)(1), unless the authorization is terminated or revoked.  Performed at [facility], [address], [place], [place] [address]      Shane Eke, NP [facility]  11/18/2022  1:45 PM

## 2022-11-18 NOTE — Progress Notes (Signed)
PROGRESS NOTE  Shane Bond WUX:[id number] DOB: 05-09-1960 DOA: 11/15/2022 PCP: [name], NP   LOS: 2 days   Brief Narrative / Interim history: 62 year old male with history of neurogenic bladder, prior CVA, performs intermittent self-cath at home, comes into the hospital with nausea, vomiting, fever, left-sided flank pain.  He was found to have mild to moderate left hydronephrosis due to an obstructing 5 mm calculus at the left UV junction, also superimposed 19 mm nonobstructive calculus within the lower lobe of the left kidney.  He also has a left perinephric stranding.  Urology was consulted, he was taken to the OR on 9/25 status post stenting.  He was monitored in the ICU periprocedurally, never required pressors.  He was transferred to the hospitalist service 9/26.  Blood cultures speciated 4/4 bottles staph epi.  Urine cultures did not appear to have been sent  Subjective / 24h Interval events: No complaints, feeling at baseline, wants to go home  Assesement and Plan: Principal problem Sepsis due to Staphylococcus epidermidis bacteremia, obstructive nephrolithiasis -he met sepsis criteria on admission with fever of 103, hypotension, source.  Lactic acid was not elevated and he did not drink for pressors in the ICU -For now continue Ancef, ID consulted, appreciate input -2D echo done this morning did not show any evidence of endocarditis surveillance blood cultures without growth so far.  Sensitivities pending -He was taken to the OR on 9/25 status post cystoscopy and left ureteral stent placement.  Urology will follow as an outpatient, currently has a stent in place.  He will need definitive management for his nephrolithiasis once infection is treated  Active problems Neurogenic bladder -for now has a Foley catheter.  He self caths at home several times per day, use a special type of catheter that we do not carry in the hospital.  Prior CVA -resume home aspirin  Essential  hypertension-hold home agents due to soft blood pressures  History of DM2, with hyperglycemia-continue sliding scale, resume home long-acting insulin at a lower dose  Lab Results  Component Value Date   HGBA1C 6.9 (A) 08/15/2022   CBG (last 3)  Recent Labs    11/17/22 2017 11/18/22 0822 11/18/22 1138  GLUCAP 220* 226* 146*    Scheduled Meds:  aspirin  81 mg Oral Daily   Chlorhexidine Gluconate Cloth  6 each Topical Daily   dapagliflozin propanediol  10 mg Oral QAC breakfast   ezetimibe  10 mg Oral Daily   heparin  5,000 Units Subcutaneous Q8H   insulin aspart  0-9 Units Subcutaneous TID AC & HS   insulin glargine-yfgn  20 Units Subcutaneous BID   nicotine  21 mg Transdermal Daily   Continuous Infusions:  sodium chloride Stopped (11/17/22 1025)    ceFAZolin (ANCEF) IV 2 g (11/18/22 1310)   lactated ringers     PRN Meds:.docusate sodium, ondansetron (ZOFRAN) IV, polyethylene glycol  Current Outpatient Medications  Medication Instructions   Accu-Chek Softclix Lancets lancets Use as instructed to check blood glucose level by fingerstick twice per day.   Aspirin 81 MG CAPS 1 tablet, Oral, Daily   Blood Glucose Monitoring Suppl (ACCU-CHEK GUIDE) w/Device KIT Use as instructed to check blood glucose level by fingerstick twice per day   ezetimibe (ZETIA) 10 mg, Oral, Daily   Farxiga 10 mg, Oral, Daily before breakfast   glucose blood (ACCU-CHEK GUIDE) test strip Use as instructed to check blood glucose by fingerstick twice per day.   Insulin Pen Needle (TECHLITE PEN NEEDLES)  PROGRESS NOTE  Shane Bond WUX:[id number] DOB: 05-09-1960 DOA: 11/15/2022 PCP: [name], NP   LOS: 2 days   Brief Narrative / Interim history: 62 year old male with history of neurogenic bladder, prior CVA, performs intermittent self-cath at home, comes into the hospital with nausea, vomiting, fever, left-sided flank pain.  He was found to have mild to moderate left hydronephrosis due to an obstructing 5 mm calculus at the left UV junction, also superimposed 19 mm nonobstructive calculus within the lower lobe of the left kidney.  He also has a left perinephric stranding.  Urology was consulted, he was taken to the OR on 9/25 status post stenting.  He was monitored in the ICU periprocedurally, never required pressors.  He was transferred to the hospitalist service 9/26.  Blood cultures speciated 4/4 bottles staph epi.  Urine cultures did not appear to have been sent  Subjective / 24h Interval events: No complaints, feeling at baseline, wants to go home  Assesement and Plan: Principal problem Sepsis due to Staphylococcus epidermidis bacteremia, obstructive nephrolithiasis -he met sepsis criteria on admission with fever of 103, hypotension, source.  Lactic acid was not elevated and he did not drink for pressors in the ICU -For now continue Ancef, ID consulted, appreciate input -2D echo done this morning did not show any evidence of endocarditis surveillance blood cultures without growth so far.  Sensitivities pending -He was taken to the OR on 9/25 status post cystoscopy and left ureteral stent placement.  Urology will follow as an outpatient, currently has a stent in place.  He will need definitive management for his nephrolithiasis once infection is treated  Active problems Neurogenic bladder -for now has a Foley catheter.  He self caths at home several times per day, use a special type of catheter that we do not carry in the hospital.  Prior CVA -resume home aspirin  Essential  hypertension-hold home agents due to soft blood pressures  History of DM2, with hyperglycemia-continue sliding scale, resume home long-acting insulin at a lower dose  Lab Results  Component Value Date   HGBA1C 6.9 (A) 08/15/2022   CBG (last 3)  Recent Labs    11/17/22 2017 11/18/22 0822 11/18/22 1138  GLUCAP 220* 226* 146*    Scheduled Meds:  aspirin  81 mg Oral Daily   Chlorhexidine Gluconate Cloth  6 each Topical Daily   dapagliflozin propanediol  10 mg Oral QAC breakfast   ezetimibe  10 mg Oral Daily   heparin  5,000 Units Subcutaneous Q8H   insulin aspart  0-9 Units Subcutaneous TID AC & HS   insulin glargine-yfgn  20 Units Subcutaneous BID   nicotine  21 mg Transdermal Daily   Continuous Infusions:  sodium chloride Stopped (11/17/22 1025)    ceFAZolin (ANCEF) IV 2 g (11/18/22 1310)   lactated ringers     PRN Meds:.docusate sodium, ondansetron (ZOFRAN) IV, polyethylene glycol  Current Outpatient Medications  Medication Instructions   Accu-Chek Softclix Lancets lancets Use as instructed to check blood glucose level by fingerstick twice per day.   Aspirin 81 MG CAPS 1 tablet, Oral, Daily   Blood Glucose Monitoring Suppl (ACCU-CHEK GUIDE) w/Device KIT Use as instructed to check blood glucose level by fingerstick twice per day   ezetimibe (ZETIA) 10 mg, Oral, Daily   Farxiga 10 mg, Oral, Daily before breakfast   glucose blood (ACCU-CHEK GUIDE) test strip Use as instructed to check blood glucose by fingerstick twice per day.   Insulin Pen Needle (TECHLITE PEN NEEDLES)  yet approved or cleared by the [place] and has been authorized for detection and/or diagnosis of SARS-CoV-2 by FDA under an Emergency Use Authorization (EUA). This EUA will remain in effect (meaning this test can be used) for the duration of the COVID-19 declaration under Section 564(b)(1) of the Act, 21 U.S.C. section 360bbb-3(b)(1), unless the authorization is terminated or revoked.     Resp Syncytial Virus by PCR NEGATIVE NEGATIVE Final    Comment: (NOTE) Fact Sheet for Patients: BloggerCourse.com  Fact Sheet for Healthcare Providers: SeriousBroker.it  This test is not yet approved or cleared by the [place] FDA and has been authorized for detection and/or diagnosis of SARS-CoV-2 by FDA under an  Emergency Use Authorization (EUA). This EUA will remain in effect (meaning this test can be used) for the duration of the COVID-19 declaration under Section 564(b)(1) of the Act, 21 U.S.C. section 360bbb-3(b)(1), unless the authorization is terminated or revoked.  Performed at [facility], [address], [place], [place] [address]      Radiology Studies: ECHOCARDIOGRAM COMPLETE  Result Date: 11/18/2022    ECHOCARDIOGRAM REPORT   Patient Name:   Shane Bond Date of Exam: 11/18/2022 Medical Rec #:  [id number]        Height:       73.0 in Accession #:    [id number]       Weight:       241.0 lb Date of Birth:  03-19-1960         BSA:          2.329 m Patient Age:    62 years         BP:           125/69 mmHg Patient Gender: M                HR:           61 bpm. Exam Location:  Inpatient Procedure: 2D Echo, Cardiac Doppler, Color Doppler and Intracardiac            Opacification Agent Indications:    Bacteremia R78.81  History:        Patient has no prior history of Echocardiogram examinations.                 CAD; Risk Factors:Hypertension, Diabetes, Dyslipidemia and                 Current Smoker.  Sonographer:    Lucendia Herrlich Referring Phys: [id number] Daylene Katayama Alanys Godino IMPRESSIONS  1. Left ventricular ejection fraction, by estimation, is 60 to 65%. The left ventricle has normal function. The left ventricle has no regional wall motion abnormalities. There is mild left ventricular hypertrophy of the lateral segment. Left ventricular  diastolic parameters were normal.  2. Right ventricular systolic function is normal. The right ventricular size is normal.  3. The mitral valve is normal in structure. Mild mitral valve regurgitation. No evidence of mitral stenosis.  4. The aortic valve is tricuspid. Aortic valve regurgitation is not visualized. No aortic stenosis is present.  5. The inferior vena cava is normal in size with greater than 50% respiratory variability, suggesting right atrial pressure of  3 mmHg. FINDINGS  Left Ventricle: Left ventricular ejection fraction, by estimation, is 60 to 65%. The left ventricle has normal function. The left ventricle has no regional wall motion abnormalities. Definity contrast agent was given IV to delineate the left ventricular  endocardial borders. The left ventricular internal  yet approved or cleared by the [place] and has been authorized for detection and/or diagnosis of SARS-CoV-2 by FDA under an Emergency Use Authorization (EUA). This EUA will remain in effect (meaning this test can be used) for the duration of the COVID-19 declaration under Section 564(b)(1) of the Act, 21 U.S.C. section 360bbb-3(b)(1), unless the authorization is terminated or revoked.     Resp Syncytial Virus by PCR NEGATIVE NEGATIVE Final    Comment: (NOTE) Fact Sheet for Patients: BloggerCourse.com  Fact Sheet for Healthcare Providers: SeriousBroker.it  This test is not yet approved or cleared by the [place] FDA and has been authorized for detection and/or diagnosis of SARS-CoV-2 by FDA under an  Emergency Use Authorization (EUA). This EUA will remain in effect (meaning this test can be used) for the duration of the COVID-19 declaration under Section 564(b)(1) of the Act, 21 U.S.C. section 360bbb-3(b)(1), unless the authorization is terminated or revoked.  Performed at [facility], [address], [place], [place] [address]      Radiology Studies: ECHOCARDIOGRAM COMPLETE  Result Date: 11/18/2022    ECHOCARDIOGRAM REPORT   Patient Name:   Shane Bond Date of Exam: 11/18/2022 Medical Rec #:  [id number]        Height:       73.0 in Accession #:    [id number]       Weight:       241.0 lb Date of Birth:  03-19-1960         BSA:          2.329 m Patient Age:    62 years         BP:           125/69 mmHg Patient Gender: M                HR:           61 bpm. Exam Location:  Inpatient Procedure: 2D Echo, Cardiac Doppler, Color Doppler and Intracardiac            Opacification Agent Indications:    Bacteremia R78.81  History:        Patient has no prior history of Echocardiogram examinations.                 CAD; Risk Factors:Hypertension, Diabetes, Dyslipidemia and                 Current Smoker.  Sonographer:    Lucendia Herrlich Referring Phys: [id number] Daylene Katayama Alanys Godino IMPRESSIONS  1. Left ventricular ejection fraction, by estimation, is 60 to 65%. The left ventricle has normal function. The left ventricle has no regional wall motion abnormalities. There is mild left ventricular hypertrophy of the lateral segment. Left ventricular  diastolic parameters were normal.  2. Right ventricular systolic function is normal. The right ventricular size is normal.  3. The mitral valve is normal in structure. Mild mitral valve regurgitation. No evidence of mitral stenosis.  4. The aortic valve is tricuspid. Aortic valve regurgitation is not visualized. No aortic stenosis is present.  5. The inferior vena cava is normal in size with greater than 50% respiratory variability, suggesting right atrial pressure of  3 mmHg. FINDINGS  Left Ventricle: Left ventricular ejection fraction, by estimation, is 60 to 65%. The left ventricle has normal function. The left ventricle has no regional wall motion abnormalities. Definity contrast agent was given IV to delineate the left ventricular  endocardial borders. The left ventricular internal  yet approved or cleared by the [place] and has been authorized for detection and/or diagnosis of SARS-CoV-2 by FDA under an Emergency Use Authorization (EUA). This EUA will remain in effect (meaning this test can be used) for the duration of the COVID-19 declaration under Section 564(b)(1) of the Act, 21 U.S.C. section 360bbb-3(b)(1), unless the authorization is terminated or revoked.     Resp Syncytial Virus by PCR NEGATIVE NEGATIVE Final    Comment: (NOTE) Fact Sheet for Patients: BloggerCourse.com  Fact Sheet for Healthcare Providers: SeriousBroker.it  This test is not yet approved or cleared by the [place] FDA and has been authorized for detection and/or diagnosis of SARS-CoV-2 by FDA under an  Emergency Use Authorization (EUA). This EUA will remain in effect (meaning this test can be used) for the duration of the COVID-19 declaration under Section 564(b)(1) of the Act, 21 U.S.C. section 360bbb-3(b)(1), unless the authorization is terminated or revoked.  Performed at [facility], [address], [place], [place] [address]      Radiology Studies: ECHOCARDIOGRAM COMPLETE  Result Date: 11/18/2022    ECHOCARDIOGRAM REPORT   Patient Name:   Shane Bond Date of Exam: 11/18/2022 Medical Rec #:  [id number]        Height:       73.0 in Accession #:    [id number]       Weight:       241.0 lb Date of Birth:  03-19-1960         BSA:          2.329 m Patient Age:    62 years         BP:           125/69 mmHg Patient Gender: M                HR:           61 bpm. Exam Location:  Inpatient Procedure: 2D Echo, Cardiac Doppler, Color Doppler and Intracardiac            Opacification Agent Indications:    Bacteremia R78.81  History:        Patient has no prior history of Echocardiogram examinations.                 CAD; Risk Factors:Hypertension, Diabetes, Dyslipidemia and                 Current Smoker.  Sonographer:    Lucendia Herrlich Referring Phys: [id number] Daylene Katayama Alanys Godino IMPRESSIONS  1. Left ventricular ejection fraction, by estimation, is 60 to 65%. The left ventricle has normal function. The left ventricle has no regional wall motion abnormalities. There is mild left ventricular hypertrophy of the lateral segment. Left ventricular  diastolic parameters were normal.  2. Right ventricular systolic function is normal. The right ventricular size is normal.  3. The mitral valve is normal in structure. Mild mitral valve regurgitation. No evidence of mitral stenosis.  4. The aortic valve is tricuspid. Aortic valve regurgitation is not visualized. No aortic stenosis is present.  5. The inferior vena cava is normal in size with greater than 50% respiratory variability, suggesting right atrial pressure of  3 mmHg. FINDINGS  Left Ventricle: Left ventricular ejection fraction, by estimation, is 60 to 65%. The left ventricle has normal function. The left ventricle has no regional wall motion abnormalities. Definity contrast agent was given IV to delineate the left ventricular  endocardial borders. The left ventricular internal  PROGRESS NOTE  Shane Bond WUX:[id number] DOB: 05-09-1960 DOA: 11/15/2022 PCP: [name], NP   LOS: 2 days   Brief Narrative / Interim history: 62 year old male with history of neurogenic bladder, prior CVA, performs intermittent self-cath at home, comes into the hospital with nausea, vomiting, fever, left-sided flank pain.  He was found to have mild to moderate left hydronephrosis due to an obstructing 5 mm calculus at the left UV junction, also superimposed 19 mm nonobstructive calculus within the lower lobe of the left kidney.  He also has a left perinephric stranding.  Urology was consulted, he was taken to the OR on 9/25 status post stenting.  He was monitored in the ICU periprocedurally, never required pressors.  He was transferred to the hospitalist service 9/26.  Blood cultures speciated 4/4 bottles staph epi.  Urine cultures did not appear to have been sent  Subjective / 24h Interval events: No complaints, feeling at baseline, wants to go home  Assesement and Plan: Principal problem Sepsis due to Staphylococcus epidermidis bacteremia, obstructive nephrolithiasis -he met sepsis criteria on admission with fever of 103, hypotension, source.  Lactic acid was not elevated and he did not drink for pressors in the ICU -For now continue Ancef, ID consulted, appreciate input -2D echo done this morning did not show any evidence of endocarditis surveillance blood cultures without growth so far.  Sensitivities pending -He was taken to the OR on 9/25 status post cystoscopy and left ureteral stent placement.  Urology will follow as an outpatient, currently has a stent in place.  He will need definitive management for his nephrolithiasis once infection is treated  Active problems Neurogenic bladder -for now has a Foley catheter.  He self caths at home several times per day, use a special type of catheter that we do not carry in the hospital.  Prior CVA -resume home aspirin  Essential  hypertension-hold home agents due to soft blood pressures  History of DM2, with hyperglycemia-continue sliding scale, resume home long-acting insulin at a lower dose  Lab Results  Component Value Date   HGBA1C 6.9 (A) 08/15/2022   CBG (last 3)  Recent Labs    11/17/22 2017 11/18/22 0822 11/18/22 1138  GLUCAP 220* 226* 146*    Scheduled Meds:  aspirin  81 mg Oral Daily   Chlorhexidine Gluconate Cloth  6 each Topical Daily   dapagliflozin propanediol  10 mg Oral QAC breakfast   ezetimibe  10 mg Oral Daily   heparin  5,000 Units Subcutaneous Q8H   insulin aspart  0-9 Units Subcutaneous TID AC & HS   insulin glargine-yfgn  20 Units Subcutaneous BID   nicotine  21 mg Transdermal Daily   Continuous Infusions:  sodium chloride Stopped (11/17/22 1025)    ceFAZolin (ANCEF) IV 2 g (11/18/22 1310)   lactated ringers     PRN Meds:.docusate sodium, ondansetron (ZOFRAN) IV, polyethylene glycol  Current Outpatient Medications  Medication Instructions   Accu-Chek Softclix Lancets lancets Use as instructed to check blood glucose level by fingerstick twice per day.   Aspirin 81 MG CAPS 1 tablet, Oral, Daily   Blood Glucose Monitoring Suppl (ACCU-CHEK GUIDE) w/Device KIT Use as instructed to check blood glucose level by fingerstick twice per day   ezetimibe (ZETIA) 10 mg, Oral, Daily   Farxiga 10 mg, Oral, Daily before breakfast   glucose blood (ACCU-CHEK GUIDE) test strip Use as instructed to check blood glucose by fingerstick twice per day.   Insulin Pen Needle (TECHLITE PEN NEEDLES)  yet approved or cleared by the [place] and has been authorized for detection and/or diagnosis of SARS-CoV-2 by FDA under an Emergency Use Authorization (EUA). This EUA will remain in effect (meaning this test can be used) for the duration of the COVID-19 declaration under Section 564(b)(1) of the Act, 21 U.S.C. section 360bbb-3(b)(1), unless the authorization is terminated or revoked.     Resp Syncytial Virus by PCR NEGATIVE NEGATIVE Final    Comment: (NOTE) Fact Sheet for Patients: BloggerCourse.com  Fact Sheet for Healthcare Providers: SeriousBroker.it  This test is not yet approved or cleared by the [place] FDA and has been authorized for detection and/or diagnosis of SARS-CoV-2 by FDA under an  Emergency Use Authorization (EUA). This EUA will remain in effect (meaning this test can be used) for the duration of the COVID-19 declaration under Section 564(b)(1) of the Act, 21 U.S.C. section 360bbb-3(b)(1), unless the authorization is terminated or revoked.  Performed at [facility], [address], [place], [place] [address]      Radiology Studies: ECHOCARDIOGRAM COMPLETE  Result Date: 11/18/2022    ECHOCARDIOGRAM REPORT   Patient Name:   Shane Bond Date of Exam: 11/18/2022 Medical Rec #:  [id number]        Height:       73.0 in Accession #:    [id number]       Weight:       241.0 lb Date of Birth:  03-19-1960         BSA:          2.329 m Patient Age:    62 years         BP:           125/69 mmHg Patient Gender: M                HR:           61 bpm. Exam Location:  Inpatient Procedure: 2D Echo, Cardiac Doppler, Color Doppler and Intracardiac            Opacification Agent Indications:    Bacteremia R78.81  History:        Patient has no prior history of Echocardiogram examinations.                 CAD; Risk Factors:Hypertension, Diabetes, Dyslipidemia and                 Current Smoker.  Sonographer:    Lucendia Herrlich Referring Phys: [id number] Daylene Katayama Alanys Godino IMPRESSIONS  1. Left ventricular ejection fraction, by estimation, is 60 to 65%. The left ventricle has normal function. The left ventricle has no regional wall motion abnormalities. There is mild left ventricular hypertrophy of the lateral segment. Left ventricular  diastolic parameters were normal.  2. Right ventricular systolic function is normal. The right ventricular size is normal.  3. The mitral valve is normal in structure. Mild mitral valve regurgitation. No evidence of mitral stenosis.  4. The aortic valve is tricuspid. Aortic valve regurgitation is not visualized. No aortic stenosis is present.  5. The inferior vena cava is normal in size with greater than 50% respiratory variability, suggesting right atrial pressure of  3 mmHg. FINDINGS  Left Ventricle: Left ventricular ejection fraction, by estimation, is 60 to 65%. The left ventricle has normal function. The left ventricle has no regional wall motion abnormalities. Definity contrast agent was given IV to delineate the left ventricular  endocardial borders. The left ventricular internal

## 2022-11-18 NOTE — Progress Notes (Signed)
Echocardiogram 2D Echocardiogram has been performed.  Shane Bond 11/18/2022, 10:09 AM

## 2022-11-19 ENCOUNTER — Other Ambulatory Visit (HOSPITAL_COMMUNITY): Payer: Self-pay

## 2022-11-19 DIAGNOSIS — N201 Calculus of ureter: Secondary | ICD-10-CM | POA: Diagnosis not present

## 2022-11-19 LAB — GLUCOSE, CAPILLARY
Glucose-Capillary: 184 mg/dL — ABNORMAL HIGH (ref 70–99)
Glucose-Capillary: 131 mg/dL — ABNORMAL HIGH (ref 70–99)

## 2022-11-19 MED ORDER — ACETAMINOPHEN 325 MG PO TABS
650.0000 mg | ORAL_TABLET | Freq: Four times a day (QID) | ORAL | Status: DC | PRN
  Administered 2022-11-19: 650 mg via ORAL
  Filled 2022-11-19: qty 2

## 2022-11-19 MED ORDER — LINEZOLID 600 MG PO TABS
600.0000 mg | ORAL_TABLET | Freq: Two times a day (BID) | ORAL | 0 refills | Status: AC
  Filled 2022-11-19: qty 24, 12d supply, fill #0

## 2022-11-19 NOTE — Plan of Care (Signed)
  Problem: Education: Goal: Knowledge of General Education information will improve Description: Including pain rating scale, medication(s)/side effects and non-pharmacologic comfort measures 11/19/2022 1124 by Darden Amber, RN Outcome: Adequate for Discharge 11/19/2022 0849 by Darden Amber, RN Outcome: Progressing   Problem: Health Behavior/Discharge Planning: Goal: Ability to manage health-related needs will improve 11/19/2022 1124 by Darden Amber, RN Outcome: Adequate for Discharge 11/19/2022 0849 by Darden Amber, RN Outcome: Progressing   Problem: Clinical Measurements: Goal: Ability to maintain clinical measurements within normal limits will improve 11/19/2022 1124 by Darden Amber, RN Outcome: Adequate for Discharge 11/19/2022 0849 by Darden Amber, RN Outcome: Progressing Goal: Will remain free from infection 11/19/2022 1124 by Darden Amber, RN Outcome: Adequate for Discharge 11/19/2022 0849 by Darden Amber, RN Outcome: Progressing Goal: Diagnostic test results will improve 11/19/2022 1124 by Darden Amber, RN Outcome: Adequate for Discharge 11/19/2022 0849 by Darden Amber, RN Outcome: Progressing Goal: Respiratory complications will improve 11/19/2022 1124 by Darden Amber, RN Outcome: Adequate for Discharge 11/19/2022 0849 by Darden Amber, RN Outcome: Progressing Goal: Cardiovascular complication will be avoided 11/19/2022 1124 by Darden Amber, RN Outcome: Adequate for Discharge 11/19/2022 0849 by Darden Amber, RN Outcome: Progressing   Problem: Activity: Goal: Risk for activity intolerance will decrease 11/19/2022 1124 by Darden Amber, RN Outcome: Adequate for Discharge 11/19/2022 0849 by Darden Amber, RN Outcome: Progressing   Problem: Nutrition: Goal: Adequate nutrition will be maintained 11/19/2022 1124 by Darden Amber, RN Outcome: Adequate for Discharge 11/19/2022 0849 by  Darden Amber, RN Outcome: Progressing   Problem: Coping: Goal: Level of anxiety will decrease 11/19/2022 1124 by Darden Amber, RN Outcome: Adequate for Discharge 11/19/2022 0849 by Darden Amber, RN Outcome: Progressing   Problem: Elimination: Goal: Will not experience complications related to bowel motility 11/19/2022 1124 by Darden Amber, RN Outcome: Adequate for Discharge 11/19/2022 0849 by Darden Amber, RN Outcome: Progressing Goal: Will not experience complications related to urinary retention 11/19/2022 1124 by Darden Amber, RN Outcome: Adequate for Discharge 11/19/2022 0849 by Darden Amber, RN Outcome: Progressing   Problem: Pain Managment: Goal: General experience of comfort will improve 11/19/2022 1124 by Darden Amber, RN Outcome: Adequate for Discharge 11/19/2022 0849 by Darden Amber, RN Outcome: Progressing   Problem: Safety: Goal: Ability to remain free from injury will improve 11/19/2022 1124 by Darden Amber, RN Outcome: Adequate for Discharge 11/19/2022 0849 by Darden Amber, RN Outcome: Progressing   Problem: Skin Integrity: Goal: Risk for impaired skin integrity will decrease 11/19/2022 1124 by Darden Amber, RN Outcome: Adequate for Discharge 11/19/2022 0849 by Darden Amber, RN Outcome: Progressing

## 2022-11-19 NOTE — Discharge Summary (Signed)
Tabs tablet Generic drug: dapagliflozin propanediol Take 1 tablet (10 mg total) by mouth daily before breakfast.   Lantus SoloStar 100 UNIT/ML Solostar Pen Generic drug: insulin glargine Inject 40 Units into the skin 2 (two) times daily.   linezolid 600 MG tablet Commonly known as: ZYVOX Take 1  tablet (600 mg total) by mouth 2 (two) times daily for 12 days.   losartan 25 MG tablet Commonly known as: COZAAR Take 1 tablet (25 mg total) by mouth daily.   Misc. Devices Misc Please provide patient with standard sized 1F urinary catheters for self cathing along with lubricant.   Carex Coccyx Cushion Misc Use daily when sitting for pressure relief ICD10 L98.429   MULTIVITAMIN ADULT PO Take 1 tablet by mouth daily.   omega-3 acid ethyl esters 1 g capsule Commonly known as: LOVAZA Take 2 capsules (2 g total) by mouth 2 (two) times daily. Please fill as a 90 day supply   Ozempic (0.25 or 0.5 MG/DOSE) 2 MG/3ML Sopn Generic drug: Semaglutide(0.25 or 0.5MG /DOS) Inject 0.5 mg into the skin once a week.   phenazopyridine 100 MG tablet Commonly known as: Pyridium Take 1 tablet (100 mg total) by mouth 3 (three) times daily as needed (urinary pain).   TechLite Plus Pen Needles 32G X 4 MM Misc Generic drug: Insulin Pen Needle Inject as directed 2 (two) times daily.   triamcinolone cream 0.1 % Commonly known as: KENALOG Apply 1 Application topically 2 (two) times daily. Apply to both hands        Follow-up Information     [name], MD Follow up in 1 week(s).   Specialty: Urology Contact information: [address] [place] [phone]         [name], NP Follow up in 1 week(s).   Specialty: Nurse Practitioner Contact information: [address] [place] [address] [place] [address] [phone]                 Consultations: Urology PCCM ID  Procedures/Studies:  ECHOCARDIOGRAM COMPLETE  Result Date: 11/18/2022    ECHOCARDIOGRAM REPORT   Patient Name:   Shane Bond Date of Exam: 11/18/2022 Medical Rec #:  [id number]        Height:       73.0 in Accession #:    [id number]       Weight:       241.0 lb Date of Birth:  October 12, 1960         BSA:          2.329 m Patient Age:    62 years         BP:           125/69 mmHg  Patient Gender: M                HR:           61 bpm. Exam Location:  Inpatient Procedure: 2D Echo, Cardiac Doppler, Color Doppler and Intracardiac            Opacification Agent Indications:    Bacteremia R78.81  History:        Patient has no prior history of Echocardiogram examinations.                 CAD; Risk Factors:Hypertension, Diabetes, Dyslipidemia and                 Current Smoker.  Sonographer:    Lucendia Herrlich Referring Phys: [phone]  Tabs tablet Generic drug: dapagliflozin propanediol Take 1 tablet (10 mg total) by mouth daily before breakfast.   Lantus SoloStar 100 UNIT/ML Solostar Pen Generic drug: insulin glargine Inject 40 Units into the skin 2 (two) times daily.   linezolid 600 MG tablet Commonly known as: ZYVOX Take 1  tablet (600 mg total) by mouth 2 (two) times daily for 12 days.   losartan 25 MG tablet Commonly known as: COZAAR Take 1 tablet (25 mg total) by mouth daily.   Misc. Devices Misc Please provide patient with standard sized 1F urinary catheters for self cathing along with lubricant.   Carex Coccyx Cushion Misc Use daily when sitting for pressure relief ICD10 L98.429   MULTIVITAMIN ADULT PO Take 1 tablet by mouth daily.   omega-3 acid ethyl esters 1 g capsule Commonly known as: LOVAZA Take 2 capsules (2 g total) by mouth 2 (two) times daily. Please fill as a 90 day supply   Ozempic (0.25 or 0.5 MG/DOSE) 2 MG/3ML Sopn Generic drug: Semaglutide(0.25 or 0.5MG /DOS) Inject 0.5 mg into the skin once a week.   phenazopyridine 100 MG tablet Commonly known as: Pyridium Take 1 tablet (100 mg total) by mouth 3 (three) times daily as needed (urinary pain).   TechLite Plus Pen Needles 32G X 4 MM Misc Generic drug: Insulin Pen Needle Inject as directed 2 (two) times daily.   triamcinolone cream 0.1 % Commonly known as: KENALOG Apply 1 Application topically 2 (two) times daily. Apply to both hands        Follow-up Information     [name], MD Follow up in 1 week(s).   Specialty: Urology Contact information: [address] [place] [phone]         [name], NP Follow up in 1 week(s).   Specialty: Nurse Practitioner Contact information: [address] [place] [address] [place] [address] [phone]                 Consultations: Urology PCCM ID  Procedures/Studies:  ECHOCARDIOGRAM COMPLETE  Result Date: 11/18/2022    ECHOCARDIOGRAM REPORT   Patient Name:   Shane Bond Date of Exam: 11/18/2022 Medical Rec #:  [id number]        Height:       73.0 in Accession #:    [id number]       Weight:       241.0 lb Date of Birth:  October 12, 1960         BSA:          2.329 m Patient Age:    62 years         BP:           125/69 mmHg  Patient Gender: M                HR:           61 bpm. Exam Location:  Inpatient Procedure: 2D Echo, Cardiac Doppler, Color Doppler and Intracardiac            Opacification Agent Indications:    Bacteremia R78.81  History:        Patient has no prior history of Echocardiogram examinations.                 CAD; Risk Factors:Hypertension, Diabetes, Dyslipidemia and                 Current Smoker.  Sonographer:    Lucendia Herrlich Referring Phys: [phone]  Physician Discharge Summary  [name] NWG:[id number] DOB: 06-15-1960 DOA: 11/15/2022  PCP: [name], NP  Admit date: 11/15/2022 Discharge date: 11/19/2022  Admitted From: home Disposition:  home  Recommendations for Outpatient Follow-up:  Follow up with PCP in 1-2 weeks  Home Health: none Equipment/Devices: none  Discharge Condition: stable CODE STATUS: Full code Diet Orders (From admission, onward)     Start     Ordered   11/16/22 0535  Diet regular Room service appropriate? Yes; Fluid consistency: Thin  Diet effective now       Question Answer Comment  Room service appropriate? Yes   Fluid consistency: Thin      11/16/22 0536            HPI: Per admitting MD, 62 year old man who presented to [facility] ED 9/24 with L flank pain and nausea with vomiting. PMHx significant for HTN, HLD, CAD with MI (inferior wall), CVA c/b neurogenic bladder with frequent UTI and nephrolithiasis (utilizes CIC), T2DM, BPH, prostate CA. On ED arrival, patient was febrile to Tmax 103F, tachycardic to 110s, normotensive with BP 110/71, RR 18, SpO2 95%. Labs were notable for WBC 5.1, Hgb 13.6, Plt 153. INR 1.1. Na 138, K 3.7, CO2 19, Cr 1.06 (baseline 0.7-0.9), AST 38/ALT 49, Alk Phos/Tbili WNL. LA 2.8. COVID/Flu/RSV negative. UA SG > 1.046, > 500 glucose, moderate Hgb, nitrite negative. CXR without active cardiopulmonary disease. CT A/P with mild-moderate L hydronephrosis and delayed L renal cortical enhancement 2/2 obstructing 5mm calculus at left UVJ, moderate L perinephric stranding and punctate 1-66mm calculi in bladder. Broad-spectrum Vanc/Cefepime given in ED. Urology was consulted and took patient to OR 9/25AM for cystoscopy and L ureteral stent placement. Intraoperative course was uncomplicated and patient was brought to PACU postoperatively. Given downtrending BP despite albumin, peripheral vasopressors were ordered.  Hospital Course / Discharge diagnoses: Principal Problem:    Ureteral calculus, left Active Problems:   Staphylococcus epidermidis bacteremia   Principal problem Sepsis due to Staphylococcus epidermidis bacteremia, obstructive nephrolithiasis - he met sepsis criteria on admission with fever of 103, hypotension, source.  Lactic acid was not elevated and he did not need pressors in the ICU.  Urology consulted, and he was taken to the OR on 9/25 status post cystoscopy and left ureteral stent placement.ID consulted, underwent surveillance cultures which are negative at the time of discharge.  Has remained stable, afebrile, will transition to oral antibiotics and discharged home in stable condition. He will need definitive management for his nephrolithiasis once infection is treated   Active problems Neurogenic bladder -continue intermittent catheterization at home Prior CVA -resume home aspirin History of DM2, with hyperglycemia-resume home meds  Sepsis ruled out   Discharge Instructions   Allergies as of 11/19/2022       Reactions   Lipitor [atorvastatin] Rash   Lisinopril Rash        Medication List     TAKE these medications    Accu-Chek Guide test strip Generic drug: glucose blood Use as instructed to check blood glucose by fingerstick twice per day.   Accu-Chek Guide w/Device Kit Use as instructed to check blood glucose level by fingerstick twice per day   Accu-Chek Softclix Lancets lancets Use as instructed to check blood glucose level by fingerstick twice per day.   Aspirin 81 MG Caps Take 1 tablet by mouth daily.   ezetimibe 10 MG tablet Commonly known as: Zetia Take 1 tablet (10 mg total) by mouth daily.   Farxiga 10 MG  Physician Discharge Summary  [name] NWG:[id number] DOB: 06-15-1960 DOA: 11/15/2022  PCP: [name], NP  Admit date: 11/15/2022 Discharge date: 11/19/2022  Admitted From: home Disposition:  home  Recommendations for Outpatient Follow-up:  Follow up with PCP in 1-2 weeks  Home Health: none Equipment/Devices: none  Discharge Condition: stable CODE STATUS: Full code Diet Orders (From admission, onward)     Start     Ordered   11/16/22 0535  Diet regular Room service appropriate? Yes; Fluid consistency: Thin  Diet effective now       Question Answer Comment  Room service appropriate? Yes   Fluid consistency: Thin      11/16/22 0536            HPI: Per admitting MD, 62 year old man who presented to [facility] ED 9/24 with L flank pain and nausea with vomiting. PMHx significant for HTN, HLD, CAD with MI (inferior wall), CVA c/b neurogenic bladder with frequent UTI and nephrolithiasis (utilizes CIC), T2DM, BPH, prostate CA. On ED arrival, patient was febrile to Tmax 103F, tachycardic to 110s, normotensive with BP 110/71, RR 18, SpO2 95%. Labs were notable for WBC 5.1, Hgb 13.6, Plt 153. INR 1.1. Na 138, K 3.7, CO2 19, Cr 1.06 (baseline 0.7-0.9), AST 38/ALT 49, Alk Phos/Tbili WNL. LA 2.8. COVID/Flu/RSV negative. UA SG > 1.046, > 500 glucose, moderate Hgb, nitrite negative. CXR without active cardiopulmonary disease. CT A/P with mild-moderate L hydronephrosis and delayed L renal cortical enhancement 2/2 obstructing 5mm calculus at left UVJ, moderate L perinephric stranding and punctate 1-66mm calculi in bladder. Broad-spectrum Vanc/Cefepime given in ED. Urology was consulted and took patient to OR 9/25AM for cystoscopy and L ureteral stent placement. Intraoperative course was uncomplicated and patient was brought to PACU postoperatively. Given downtrending BP despite albumin, peripheral vasopressors were ordered.  Hospital Course / Discharge diagnoses: Principal Problem:    Ureteral calculus, left Active Problems:   Staphylococcus epidermidis bacteremia   Principal problem Sepsis due to Staphylococcus epidermidis bacteremia, obstructive nephrolithiasis - he met sepsis criteria on admission with fever of 103, hypotension, source.  Lactic acid was not elevated and he did not need pressors in the ICU.  Urology consulted, and he was taken to the OR on 9/25 status post cystoscopy and left ureteral stent placement.ID consulted, underwent surveillance cultures which are negative at the time of discharge.  Has remained stable, afebrile, will transition to oral antibiotics and discharged home in stable condition. He will need definitive management for his nephrolithiasis once infection is treated   Active problems Neurogenic bladder -continue intermittent catheterization at home Prior CVA -resume home aspirin History of DM2, with hyperglycemia-resume home meds  Sepsis ruled out   Discharge Instructions   Allergies as of 11/19/2022       Reactions   Lipitor [atorvastatin] Rash   Lisinopril Rash        Medication List     TAKE these medications    Accu-Chek Guide test strip Generic drug: glucose blood Use as instructed to check blood glucose by fingerstick twice per day.   Accu-Chek Guide w/Device Kit Use as instructed to check blood glucose level by fingerstick twice per day   Accu-Chek Softclix Lancets lancets Use as instructed to check blood glucose level by fingerstick twice per day.   Aspirin 81 MG Caps Take 1 tablet by mouth daily.   ezetimibe 10 MG tablet Commonly known as: Zetia Take 1 tablet (10 mg total) by mouth daily.   Farxiga 10 MG  Tabs tablet Generic drug: dapagliflozin propanediol Take 1 tablet (10 mg total) by mouth daily before breakfast.   Lantus SoloStar 100 UNIT/ML Solostar Pen Generic drug: insulin glargine Inject 40 Units into the skin 2 (two) times daily.   linezolid 600 MG tablet Commonly known as: ZYVOX Take 1  tablet (600 mg total) by mouth 2 (two) times daily for 12 days.   losartan 25 MG tablet Commonly known as: COZAAR Take 1 tablet (25 mg total) by mouth daily.   Misc. Devices Misc Please provide patient with standard sized 1F urinary catheters for self cathing along with lubricant.   Carex Coccyx Cushion Misc Use daily when sitting for pressure relief ICD10 L98.429   MULTIVITAMIN ADULT PO Take 1 tablet by mouth daily.   omega-3 acid ethyl esters 1 g capsule Commonly known as: LOVAZA Take 2 capsules (2 g total) by mouth 2 (two) times daily. Please fill as a 90 day supply   Ozempic (0.25 or 0.5 MG/DOSE) 2 MG/3ML Sopn Generic drug: Semaglutide(0.25 or 0.5MG /DOS) Inject 0.5 mg into the skin once a week.   phenazopyridine 100 MG tablet Commonly known as: Pyridium Take 1 tablet (100 mg total) by mouth 3 (three) times daily as needed (urinary pain).   TechLite Plus Pen Needles 32G X 4 MM Misc Generic drug: Insulin Pen Needle Inject as directed 2 (two) times daily.   triamcinolone cream 0.1 % Commonly known as: KENALOG Apply 1 Application topically 2 (two) times daily. Apply to both hands        Follow-up Information     [name], MD Follow up in 1 week(s).   Specialty: Urology Contact information: [address] [place] [phone]         [name], NP Follow up in 1 week(s).   Specialty: Nurse Practitioner Contact information: [address] [place] [address] [place] [address] [phone]                 Consultations: Urology PCCM ID  Procedures/Studies:  ECHOCARDIOGRAM COMPLETE  Result Date: 11/18/2022    ECHOCARDIOGRAM REPORT   Patient Name:   Shane Bond Date of Exam: 11/18/2022 Medical Rec #:  [id number]        Height:       73.0 in Accession #:    [id number]       Weight:       241.0 lb Date of Birth:  October 12, 1960         BSA:          2.329 m Patient Age:    62 years         BP:           125/69 mmHg  Patient Gender: M                HR:           61 bpm. Exam Location:  Inpatient Procedure: 2D Echo, Cardiac Doppler, Color Doppler and Intracardiac            Opacification Agent Indications:    Bacteremia R78.81  History:        Patient has no prior history of Echocardiogram examinations.                 CAD; Risk Factors:Hypertension, Diabetes, Dyslipidemia and                 Current Smoker.  Sonographer:    Lucendia Herrlich Referring Phys: [phone]  Tabs tablet Generic drug: dapagliflozin propanediol Take 1 tablet (10 mg total) by mouth daily before breakfast.   Lantus SoloStar 100 UNIT/ML Solostar Pen Generic drug: insulin glargine Inject 40 Units into the skin 2 (two) times daily.   linezolid 600 MG tablet Commonly known as: ZYVOX Take 1  tablet (600 mg total) by mouth 2 (two) times daily for 12 days.   losartan 25 MG tablet Commonly known as: COZAAR Take 1 tablet (25 mg total) by mouth daily.   Misc. Devices Misc Please provide patient with standard sized 1F urinary catheters for self cathing along with lubricant.   Carex Coccyx Cushion Misc Use daily when sitting for pressure relief ICD10 L98.429   MULTIVITAMIN ADULT PO Take 1 tablet by mouth daily.   omega-3 acid ethyl esters 1 g capsule Commonly known as: LOVAZA Take 2 capsules (2 g total) by mouth 2 (two) times daily. Please fill as a 90 day supply   Ozempic (0.25 or 0.5 MG/DOSE) 2 MG/3ML Sopn Generic drug: Semaglutide(0.25 or 0.5MG /DOS) Inject 0.5 mg into the skin once a week.   phenazopyridine 100 MG tablet Commonly known as: Pyridium Take 1 tablet (100 mg total) by mouth 3 (three) times daily as needed (urinary pain).   TechLite Plus Pen Needles 32G X 4 MM Misc Generic drug: Insulin Pen Needle Inject as directed 2 (two) times daily.   triamcinolone cream 0.1 % Commonly known as: KENALOG Apply 1 Application topically 2 (two) times daily. Apply to both hands        Follow-up Information     [name], MD Follow up in 1 week(s).   Specialty: Urology Contact information: [address] [place] [phone]         [name], NP Follow up in 1 week(s).   Specialty: Nurse Practitioner Contact information: [address] [place] [address] [place] [address] [phone]                 Consultations: Urology PCCM ID  Procedures/Studies:  ECHOCARDIOGRAM COMPLETE  Result Date: 11/18/2022    ECHOCARDIOGRAM REPORT   Patient Name:   Shane Bond Date of Exam: 11/18/2022 Medical Rec #:  [id number]        Height:       73.0 in Accession #:    [id number]       Weight:       241.0 lb Date of Birth:  October 12, 1960         BSA:          2.329 m Patient Age:    62 years         BP:           125/69 mmHg  Patient Gender: M                HR:           61 bpm. Exam Location:  Inpatient Procedure: 2D Echo, Cardiac Doppler, Color Doppler and Intracardiac            Opacification Agent Indications:    Bacteremia R78.81  History:        Patient has no prior history of Echocardiogram examinations.                 CAD; Risk Factors:Hypertension, Diabetes, Dyslipidemia and                 Current Smoker.  Sonographer:    Lucendia Herrlich Referring Phys: [phone]  Tabs tablet Generic drug: dapagliflozin propanediol Take 1 tablet (10 mg total) by mouth daily before breakfast.   Lantus SoloStar 100 UNIT/ML Solostar Pen Generic drug: insulin glargine Inject 40 Units into the skin 2 (two) times daily.   linezolid 600 MG tablet Commonly known as: ZYVOX Take 1  tablet (600 mg total) by mouth 2 (two) times daily for 12 days.   losartan 25 MG tablet Commonly known as: COZAAR Take 1 tablet (25 mg total) by mouth daily.   Misc. Devices Misc Please provide patient with standard sized 1F urinary catheters for self cathing along with lubricant.   Carex Coccyx Cushion Misc Use daily when sitting for pressure relief ICD10 L98.429   MULTIVITAMIN ADULT PO Take 1 tablet by mouth daily.   omega-3 acid ethyl esters 1 g capsule Commonly known as: LOVAZA Take 2 capsules (2 g total) by mouth 2 (two) times daily. Please fill as a 90 day supply   Ozempic (0.25 or 0.5 MG/DOSE) 2 MG/3ML Sopn Generic drug: Semaglutide(0.25 or 0.5MG /DOS) Inject 0.5 mg into the skin once a week.   phenazopyridine 100 MG tablet Commonly known as: Pyridium Take 1 tablet (100 mg total) by mouth 3 (three) times daily as needed (urinary pain).   TechLite Plus Pen Needles 32G X 4 MM Misc Generic drug: Insulin Pen Needle Inject as directed 2 (two) times daily.   triamcinolone cream 0.1 % Commonly known as: KENALOG Apply 1 Application topically 2 (two) times daily. Apply to both hands        Follow-up Information     [name], MD Follow up in 1 week(s).   Specialty: Urology Contact information: [address] [place] [address] [phone]         [name], NP Follow up in 1 week(s).   Specialty: Nurse Practitioner Contact information: [address] [place] [address] [place] [address] [phone]                 Consultations: Urology PCCM ID  Procedures/Studies:  ECHOCARDIOGRAM COMPLETE  Result Date: 11/18/2022    ECHOCARDIOGRAM REPORT   Patient Name:   Shane Bond Date of Exam: 11/18/2022 Medical Rec #:  [id number]        Height:       73.0 in Accession #:    [id number]       Weight:       241.0 lb Date of Birth:  October 12, 1960         BSA:          2.329 m Patient Age:    62 years         BP:           125/69 mmHg  Patient Gender: M                HR:           61 bpm. Exam Location:  Inpatient Procedure: 2D Echo, Cardiac Doppler, Color Doppler and Intracardiac            Opacification Agent Indications:    Bacteremia R78.81  History:        Patient has no prior history of Echocardiogram examinations.                 CAD; Risk Factors:Hypertension, Diabetes, Dyslipidemia and                 Current Smoker.  Sonographer:    Lucendia Herrlich Referring Phys: [phone]  Tabs tablet Generic drug: dapagliflozin propanediol Take 1 tablet (10 mg total) by mouth daily before breakfast.   Lantus SoloStar 100 UNIT/ML Solostar Pen Generic drug: insulin glargine Inject 40 Units into the skin 2 (two) times daily.   linezolid 600 MG tablet Commonly known as: ZYVOX Take 1  tablet (600 mg total) by mouth 2 (two) times daily for 12 days.   losartan 25 MG tablet Commonly known as: COZAAR Take 1 tablet (25 mg total) by mouth daily.   Misc. Devices Misc Please provide patient with standard sized 1F urinary catheters for self cathing along with lubricant.   Carex Coccyx Cushion Misc Use daily when sitting for pressure relief ICD10 L98.429   MULTIVITAMIN ADULT PO Take 1 tablet by mouth daily.   omega-3 acid ethyl esters 1 g capsule Commonly known as: LOVAZA Take 2 capsules (2 g total) by mouth 2 (two) times daily. Please fill as a 90 day supply   Ozempic (0.25 or 0.5 MG/DOSE) 2 MG/3ML Sopn Generic drug: Semaglutide(0.25 or 0.5MG /DOS) Inject 0.5 mg into the skin once a week.   phenazopyridine 100 MG tablet Commonly known as: Pyridium Take 1 tablet (100 mg total) by mouth 3 (three) times daily as needed (urinary pain).   TechLite Plus Pen Needles 32G X 4 MM Misc Generic drug: Insulin Pen Needle Inject as directed 2 (two) times daily.   triamcinolone cream 0.1 % Commonly known as: KENALOG Apply 1 Application topically 2 (two) times daily. Apply to both hands        Follow-up Information     [name], MD Follow up in 1 week(s).   Specialty: Urology Contact information: [address] [place] [address] [phone]         [name], NP Follow up in 1 week(s).   Specialty: Nurse Practitioner Contact information: [address] [place] [address] [place] [address] [phone]                 Consultations: Urology PCCM ID  Procedures/Studies:  ECHOCARDIOGRAM COMPLETE  Result Date: 11/18/2022    ECHOCARDIOGRAM REPORT   Patient Name:   Shane Bond Date of Exam: 11/18/2022 Medical Rec #:  [id number]        Height:       73.0 in Accession #:    [id number]       Weight:       241.0 lb Date of Birth:  October 12, 1960         BSA:          2.329 m Patient Age:    62 years         BP:           125/69 mmHg  Patient Gender: M                HR:           61 bpm. Exam Location:  Inpatient Procedure: 2D Echo, Cardiac Doppler, Color Doppler and Intracardiac            Opacification Agent Indications:    Bacteremia R78.81  History:        Patient has no prior history of Echocardiogram examinations.                 CAD; Risk Factors:Hypertension, Diabetes, Dyslipidemia and                 Current Smoker.  Sonographer:    Lucendia Herrlich Referring Phys: [phone]  Tabs tablet Generic drug: dapagliflozin propanediol Take 1 tablet (10 mg total) by mouth daily before breakfast.   Lantus SoloStar 100 UNIT/ML Solostar Pen Generic drug: insulin glargine Inject 40 Units into the skin 2 (two) times daily.   linezolid 600 MG tablet Commonly known as: ZYVOX Take 1  tablet (600 mg total) by mouth 2 (two) times daily for 12 days.   losartan 25 MG tablet Commonly known as: COZAAR Take 1 tablet (25 mg total) by mouth daily.   Misc. Devices Misc Please provide patient with standard sized 1F urinary catheters for self cathing along with lubricant.   Carex Coccyx Cushion Misc Use daily when sitting for pressure relief ICD10 L98.429   MULTIVITAMIN ADULT PO Take 1 tablet by mouth daily.   omega-3 acid ethyl esters 1 g capsule Commonly known as: LOVAZA Take 2 capsules (2 g total) by mouth 2 (two) times daily. Please fill as a 90 day supply   Ozempic (0.25 or 0.5 MG/DOSE) 2 MG/3ML Sopn Generic drug: Semaglutide(0.25 or 0.5MG /DOS) Inject 0.5 mg into the skin once a week.   phenazopyridine 100 MG tablet Commonly known as: Pyridium Take 1 tablet (100 mg total) by mouth 3 (three) times daily as needed (urinary pain).   TechLite Plus Pen Needles 32G X 4 MM Misc Generic drug: Insulin Pen Needle Inject as directed 2 (two) times daily.   triamcinolone cream 0.1 % Commonly known as: KENALOG Apply 1 Application topically 2 (two) times daily. Apply to both hands        Follow-up Information     [name], MD Follow up in 1 week(s).   Specialty: Urology Contact information: [address] [place] [address] [phone]         [name], NP Follow up in 1 week(s).   Specialty: Nurse Practitioner Contact information: [address] [place] [address] [place] [address] [phone]                 Consultations: Urology PCCM ID  Procedures/Studies:  ECHOCARDIOGRAM COMPLETE  Result Date: 11/18/2022    ECHOCARDIOGRAM REPORT   Patient Name:   Shane Bond Date of Exam: 11/18/2022 Medical Rec #:  [id number]        Height:       73.0 in Accession #:    [id number]       Weight:       241.0 lb Date of Birth:  October 12, 1960         BSA:          2.329 m Patient Age:    62 years         BP:           125/69 mmHg  Patient Gender: M                HR:           61 bpm. Exam Location:  Inpatient Procedure: 2D Echo, Cardiac Doppler, Color Doppler and Intracardiac            Opacification Agent Indications:    Bacteremia R78.81  History:        Patient has no prior history of Echocardiogram examinations.                 CAD; Risk Factors:Hypertension, Diabetes, Dyslipidemia and                 Current Smoker.  Sonographer:    Lucendia Herrlich Referring Phys: [phone]

## 2022-11-19 NOTE — TOC Transition Note (Signed)
Transition of Care [facility]) - CM/SW Discharge Note   Patient Details  Name: Shane Bond MRN: [id number] Date of Birth: 1960/08/16  Transition of Care [facility]) CM/SW Contact:  Ronny Bacon, RN Phone Number: 11/19/2022, 11:56 AM   Clinical Narrative:   Patient is being discharged today. Message received from provider that patient may need cab voucher. Spoke to patient on phone and he has a sister that could pick him up but he needed to be ready before she went to work at [employer]. Patient reports was told, from provider, that he could get taxi voucher, so he told his sister he did not need a ride. Explained to patient that he lives a distance from the hospital and that he has a source for transportation, he would not qualify for cab voucher. Patient said he would call his sister back to get her to pick him up. Message to provider to inform of same, and their response was patient will be ready to go once he received his meds from [facility] pharmacy. Nurse messaged back to say patient could not get a ride and he needs cab voucher. Informed nurse patient was supposed to call his sister to have her come pick him. Nurse responded to inform that sister was coming to pick patient up.    Final next level of care: Home/Self Care Barriers to Discharge: No Barriers Identified   Patient Goals and CMS Choice      Discharge Placement                         Discharge Plan and Services Additional resources added to the After Visit Summary for                                       Social Determinants of Health (SDOH) Interventions SDOH Screenings   Depression (PHQ2-9): Low Risk  (09/29/2022)  Tobacco Use: High Risk (11/16/2022)     Readmission Risk Interventions     No data to display

## 2022-11-19 NOTE — Discharge Instructions (Signed)
Follow with Shane Rigg, NP in 5-7 days  Please get a complete blood count and chemistry panel checked by your Primary MD at your next visit, and again as instructed by your Primary MD. Please get your medications reviewed and adjusted by your Primary MD.  Please request your Primary MD to go over all Hospital Tests and Procedure/Radiological results at the follow up, please get all Hospital records sent to your Prim MD by signing hospital release before you go home.  In some cases, there will be blood work, cultures and biopsy results pending at the time of your discharge. Please request that your primary care M.D. goes through all the records of your hospital data and follows up on these results.  If you had Pneumonia of Lung problems at the Hospital: Please get a 2 view Chest X ray done in 6-8 weeks after hospital discharge or sooner if instructed by your Primary MD.  If you have Congestive Heart Failure: Please call your Cardiologist or Primary MD anytime you have any of the following symptoms:  1) 3 pound weight gain in 24 hours or 5 pounds in 1 week  2) shortness of breath, with or without a dry hacking cough  3) swelling in the hands, feet or stomach  4) if you have to sleep on extra pillows at night in order to breathe  Follow cardiac low salt diet and 1.5 lit/day fluid restriction.  If you have diabetes Accuchecks 4 times/day, Once in AM empty stomach and then before each meal. Log in all results and show them to your primary doctor at your next visit. If any glucose reading is under 80 or above 300 call your primary MD immediately.  If you have Seizure/Convulsions/Epilepsy: Please do not drive, operate heavy machinery, participate in activities at heights or participate in high speed sports until you have seen by Primary MD or a Neurologist and advised to do so again. Per [facility] statutes, patients with seizures are not allowed to drive until they have been  seizure-free for six months.  Use caution when using heavy equipment or power tools. Avoid working on ladders or at heights. Take showers instead of baths. Ensure the water temperature is not too high on the home water heater. Do not go swimming alone. Do not lock yourself in a room alone (i.e. bathroom). When caring for infants or small children, sit down when holding, feeding, or changing them to minimize risk of injury to the child in the event you have a seizure. Maintain good sleep hygiene. Avoid alcohol.   If you had Gastrointestinal Bleeding: Please ask your Primary MD to check a complete blood count within one week of discharge or at your next visit. Your endoscopic/colonoscopic biopsies that are pending at the time of discharge, will also need to followed by your Primary MD.  Get Medicines reviewed and adjusted. Please take all your medications with you for your next visit with your Primary MD  Please request your Primary MD to go over all hospital tests and procedure/radiological results at the follow up, please ask your Primary MD to get all Hospital records sent to his/her office.  If you experience worsening of your admission symptoms, develop shortness of breath, life threatening emergency, suicidal or homicidal thoughts you must seek medical attention immediately by calling 911 or calling your MD immediately  if symptoms less severe.  You must read complete instructions/literature along with all the possible adverse reactions/side effects for all the Medicines you  take and that have been prescribed to you. Take any new Medicines after you have completely understood and accpet all the possible adverse reactions/side effects.   Do not drive or operate heavy machinery when taking Pain medications.   Do not take more than prescribed Pain, Sleep and Anxiety Medications  Special Instructions: If you have smoked or chewed Tobacco  in the last 2 yrs please stop smoking, stop any regular  Alcohol  and or any Recreational drug use.  Wear Seat belts while driving.  Please note You were cared for by a hospitalist during your hospital stay. If you have any questions about your discharge medications or the care you received while you were in the hospital after you are discharged, you can call the unit and asked to speak with the hospitalist on call if the hospitalist that took care of you is not available. Once you are discharged, your primary care physician will handle any further medical issues. Please note that NO REFILLS for any discharge medications will be authorized once you are discharged, as it is imperative that you return to your primary care physician (or establish a relationship with a primary care physician if you do not have one) for your aftercare needs so that they can reassess your need for medications and monitor your lab values.  You can reach the hospitalist office at phone [phone] or fax [phone]   If you do not have a primary care physician, you can call [phone] for a physician referral.  Activity: As tolerated with Full fall precautions use walker/cane & assistance as needed    Diet: diabetic  Disposition Home

## 2022-11-19 NOTE — Plan of Care (Signed)

## 2022-11-21 ENCOUNTER — Telehealth: Payer: Self-pay

## 2022-11-21 NOTE — Transitions of Care (Post Inpatient/ED Visit) (Signed)
11/21/2022  Name: LINNELL LUEDEMAN MRN: [id number] DOB: 1960-04-20  Today's TOC FU Call Status: Today's TOC FU Call Status:: Unsuccessful Call (1st Attempt) Unsuccessful Call (1st Attempt) Date: 11/21/22  Attempted to reach the patient regarding the most recent Inpatient/ED visit.  Follow Up Plan: Additional outreach attempts will be made to reach the patient to complete the Transitions of Care (Post Inpatient/ED visit) call.   Signature Robyne Peers, RN

## 2022-11-22 ENCOUNTER — Telehealth: Payer: Self-pay

## 2022-11-22 LAB — CULTURE, BLOOD (ROUTINE X 2)

## 2022-11-22 NOTE — Transitions of Care (Post Inpatient/ED Visit) (Signed)
11/22/2022  Name: Shane Bond MRN: [id number] DOB: May 30, 1960  Today's TOC FU Call Status: Today's TOC FU Call Status:: Unsuccessful Call (2nd Attempt) Unsuccessful Call (1st Attempt) Date: 11/21/22 Unsuccessful Call (2nd Attempt) Date: 11/22/22  Attempted to reach the patient regarding the most recent Inpatient/ED visit.  Follow Up Plan: Additional outreach attempts will be made to reach the patient to complete the Transitions of Care (Post Inpatient/ED visit) call.   Signature Robyne Peers, RN

## 2022-11-23 ENCOUNTER — Telehealth: Payer: Self-pay

## 2022-11-23 LAB — CULTURE, BLOOD (ROUTINE X 2)
Culture: NO GROWTH
Culture: NO GROWTH
Special Requests: ADEQUATE
Special Requests: ADEQUATE

## 2022-11-23 NOTE — Transitions of Care (Post Inpatient/ED Visit) (Signed)
11/23/2022  Name: Shane Bond MRN: [id number] DOB: 09-24-1960  Today's TOC FU Call Status: Today's TOC FU Call Status:: Unsuccessful Call (3rd Attempt) Unsuccessful Call (1st Attempt) Date: 11/21/22 Unsuccessful Call (2nd Attempt) Date: 11/22/22 Unsuccessful Call (3rd Attempt) Date: 11/23/22  Attempted to reach the patient regarding the most recent Inpatient/ED visit.  Follow Up Plan: No further outreach attempts will be made at this time. We have been unable to contact the patient.  The patient has an appointment with Corene Cornea, PA at [facility] on 10.23.2024  Signature Robyne Peers, RN

## 2022-11-25 ENCOUNTER — Encounter: Payer: Self-pay | Admitting: Nurse Practitioner

## 2022-11-25 ENCOUNTER — Ambulatory Visit: Payer: Medicaid Other | Attending: Nurse Practitioner | Admitting: Nurse Practitioner

## 2022-11-25 ENCOUNTER — Other Ambulatory Visit: Payer: Self-pay

## 2022-11-25 VITALS — BP 118/75 | HR 102 | Ht 73.0 in | Wt 242.4 lb

## 2022-11-25 DIAGNOSIS — N390 Urinary tract infection, site not specified: Secondary | ICD-10-CM

## 2022-11-25 DIAGNOSIS — E785 Hyperlipidemia, unspecified: Secondary | ICD-10-CM | POA: Diagnosis not present

## 2022-11-25 DIAGNOSIS — E1169 Type 2 diabetes mellitus with other specified complication: Secondary | ICD-10-CM | POA: Diagnosis not present

## 2022-11-25 DIAGNOSIS — Z23 Encounter for immunization: Secondary | ICD-10-CM

## 2022-11-25 DIAGNOSIS — Z794 Long term (current) use of insulin: Secondary | ICD-10-CM

## 2022-11-25 DIAGNOSIS — E1165 Type 2 diabetes mellitus with hyperglycemia: Secondary | ICD-10-CM | POA: Diagnosis not present

## 2022-11-25 MED ORDER — DAPAGLIFLOZIN PROPANEDIOL 10 MG PO TABS
10.0000 mg | ORAL_TABLET | Freq: Every day | ORAL | 1 refills | Status: DC
Start: 2022-11-25 — End: 2022-12-07
  Filled 2022-11-25 (×2): qty 90, 90d supply, fill #0

## 2022-11-25 MED ORDER — OZEMPIC (0.25 OR 0.5 MG/DOSE) 2 MG/3ML ~~LOC~~ SOPN
0.5000 mg | PEN_INJECTOR | SUBCUTANEOUS | 1 refills | Status: DC
  Filled 2022-11-25 (×2): qty 3, 28d supply, fill #0
  Filled 2022-12-29: qty 3, 28d supply, fill #1

## 2022-11-25 MED ORDER — EZETIMIBE 10 MG PO TABS
10.0000 mg | ORAL_TABLET | Freq: Every day | ORAL | 3 refills | Status: DC
  Filled 2022-11-25 (×2): qty 90, 90d supply, fill #0

## 2022-11-25 MED ORDER — SULFAMETHOXAZOLE-TRIMETHOPRIM 800-160 MG PO TABS
1.0000 | ORAL_TABLET | Freq: Two times a day (BID) | ORAL | 0 refills | Status: AC
Start: 2022-11-25 — End: 2022-12-02
  Filled 2022-11-25 (×2): qty 14, 7d supply, fill #0

## 2022-11-25 NOTE — Progress Notes (Unsigned)
Assessment & Plan:  Shane Bond was seen today for hospitalization follow-up.  Diagnoses and all orders for this visit:  Urinary tract infection without hematuria, site unspecified He stopped taking zyvox. Will try bactrim -     Urine Culture -     sulfamethoxazole-trimethoprim (BACTRIM DS) 800-160 MG tablet; Take 1 tablet by mouth 2 (two) times daily for 7 days.  Type 2 diabetes mellitus with hyperglycemia, with long-term current use of insulin (HCC) -     Semaglutide,0.25 or 0.5MG /DOS, (OZEMPIC, 0.25 OR 0.5 MG/DOSE,) 2 MG/3ML SOPN; Inject 0.5 mg into the skin once a week.  Encounter for immunization -     Flu vaccine trivalent PF, 6mos and older(Flulaval,Afluria,Fluarix,Fluzone)  Hyperlipidemia associated with type 2 diabetes mellitus (HCC) -     ezetimibe (ZETIA) 10 MG tablet; Take 1 tablet (10 mg total) by mouth daily.    Patient has been counseled on age-appropriate routine health concerns for screening and prevention. These are reviewed and up-to-date. Referrals have been placed accordingly. Immunizations are up-to-date or declined.    Subjective:   Chief Complaint  Patient presents with   Hospitalization Follow-up   HPI Shane Bond 62 y.o. male presents to office today for hospital follow up.  PMHx significant for HTN, HLD, CAD with MI (inferior wall), CVA c/b neurogenic bladder with frequent UTI and nephrolithiasis (utilizes CIC), T2DM, BPH, prostate CA.    [facility]U Admitted 11-15-2022 with L flank pain, N/V. CT noting L ureteral calculus and positive for staph epidermidis bacteremia.  Urology consulted, and he was taken to the OR on 9/25 status post cystoscopy and left ureteral stent placement.ID consulted, underwent surveillance cultures which are negative at the time of discharge.  Has remained stable, afebrile, will transition to oral antibiotics and discharged home in stable condition. He will need definitive management for his nephrolithiasis once infection is  treated  He was discharged home on zyvox for 12 days however today he tells me he stopped taking it 2 days after discharge as his urine was dark.    Review of Systems  Constitutional:  Negative for fever, malaise/fatigue and weight loss.  HENT: Negative.  Negative for nosebleeds.   Eyes: Negative.  Negative for blurred vision, double vision and photophobia.  Respiratory: Negative.  Negative for cough and shortness of breath.   Cardiovascular: Negative.  Negative for chest pain, palpitations and leg swelling.  Gastrointestinal: Negative.  Negative for heartburn, nausea and vomiting.  Musculoskeletal: Negative.  Negative for myalgias.  Neurological: Negative.  Negative for dizziness, focal weakness, seizures and headaches.  Psychiatric/Behavioral: Negative.  Negative for suicidal ideas.     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   Anemia    Bladder atonia    BPH (benign prostatic hyperplasia)    CAD, NATIVE VESSEL 10/29/2009   Diabetes mellitus type 2, uncontrolled, with complications    Hyperlipidemia LDL goal <70    Hypertension    Kidney stone    Neurogenic bladder as late effect of cerebrovascular accident (CVA)    OBESITY 10/22/2009   Prostate cancer [facility])     Past Surgical History:  Procedure Laterality Date   BACK SURGERY     CORONARY STENT PLACEMENT     CYSTOSCOPY W/ URETERAL STENT PLACEMENT  02/26/2012   Procedure: CYSTOSCOPY WITH RETROGRADE PYELOGRAM/URETERAL STENT PLACEMENT;  Surgeon: Valetta Fuller, MD;  Location: [facility];  Service: Urology;  Laterality: Left;   CYSTOSCOPY W/ URETERAL STENT PLACEMENT Left 11/16/2022  Procedure: 1. Cystoscopy 2. left ureteral stent placement 3. left retrograde pyelography with interpretation;  Surgeon: [name], MD;  Location: [facility] OR;  Service: Urology;  Laterality: Left;   CYSTOSCOPY WITH RETROGRADE PYELOGRAM, URETEROSCOPY AND STENT PLACEMENT Left 08/07/2019   Procedure: CYSTOSCOPY WITH LEFT RETROGRADE  PYELOGRAM, URETEROSCOPY HOLMIUM LASER AND STENT PLACEMENT;  Surgeon: [name], MD;  Location: [facility];  Service: Urology;  Laterality: Left;   CYSTOSCOPY WITH STENT PLACEMENT Left 06/24/2019   Procedure: CYSTOSCOPY WITH STENT PLACEMENT LEFT URETER WITH LEFT RETROGRADE URETERAL;  Surgeon: Rene Paci, MD;  Location: [facility];  Service: Urology;  Laterality: Left;   testicles Right     Family History  Problem Relation Age of Onset   Hypertension Mother    Diabetes Father    Stomach cancer Maternal Uncle    Diabetes Paternal Aunt    Coronary artery disease Other    Heart attack Other    Breast cancer Neg Hx    Colon cancer Neg Hx    Pancreatic cancer Neg Hx    Prostate cancer Neg Hx    Esophageal cancer Neg Hx    Liver disease Neg Hx     Social History Reviewed with no changes to be made today.   Outpatient Medications Prior to Visit  Medication Sig Dispense Refill   Accu-Chek Softclix Lancets lancets Use as instructed to check blood glucose level by fingerstick twice per day. 100 each 12   Aspirin 81 MG CAPS Take 81 mg by mouth daily.     glucose blood (ACCU-CHEK GUIDE) test strip Use as instructed to check blood glucose by fingerstick twice per day. 100 each 2   insulin glargine (LANTUS SOLOSTAR) 100 UNIT/ML Solostar Pen Inject 40 Units into the skin 2 (two) times daily. (Patient taking differently: Inject 40 Units into the skin See admin instructions. Inject 40 units the skin in the morning and 10-40 units in the evening) 24 mL 3   Insulin Pen Needle (TECHLITE PEN NEEDLES) 32G X 4 MM MISC Inject as directed 2 (two) times daily. 100 each 3   losartan (COZAAR) 25 MG tablet Take 1 tablet (25 mg total) by mouth daily. 90 tablet 1   Misc. Devices (CAREX COCCYX CUSHION) MISC Use daily when sitting for pressure relief ICD10 L98.429 1 each prn   Misc. Devices MISC Please provide patient with standard sized 4F urinary catheters for self cathing along with lubricant. 50  each prn   omega-3 acid ethyl esters (LOVAZA) 1 g capsule Take 2 capsules (2 g total) by mouth 2 (two) times daily. Please fill as a 90 day supply 360 capsule 3   dapagliflozin propanediol (FARXIGA) 10 MG TABS tablet Take 1 tablet (10 mg total) by mouth daily before breakfast. 90 tablet 1   ezetimibe (ZETIA) 10 MG tablet Take 1 tablet (10 mg total) by mouth daily. 90 tablet 3   Multiple Vitamin (MULTIVITAMIN ADULT PO) Take 1 tablet by mouth daily. (Patient not taking: Reported on 11/28/2022)     phenazopyridine (PYRIDIUM) 100 MG tablet Take 1 tablet (100 mg total) by mouth 3 (three) times daily as needed (urinary pain). (Patient not taking: Reported on 11/28/2022) 10 tablet 0   Semaglutide,0.25 or 0.5MG /DOS, (OZEMPIC, 0.25 OR 0.5 MG/DOSE,) 2 MG/3ML SOPN Inject 0.5 mg into the skin once a week. 3 mL 1   Blood Glucose Monitoring Suppl (ACCU-CHEK GUIDE) w/Device KIT Use as instructed to check blood glucose level by fingerstick twice per day (Patient not  taking: Reported on 08/15/2022) 1 kit 0   linezolid (ZYVOX) 600 MG tablet Take 1 tablet (600 mg total) by mouth 2 (two) times daily for 12 days. (Patient not taking: Reported on 11/25/2022) 24 tablet 0   triamcinolone cream (KENALOG) 0.1 % Apply 1 Application topically 2 (two) times daily. Apply to both hands (Patient not taking: Reported on 11/16/2022) 60 g 0   No facility-administered medications prior to visit.    Allergies  Allergen Reactions   Lipitor [Atorvastatin] Rash   Lisinopril Rash       Objective:    BP 118/75   Pulse (!) 102   Ht 6\' 1"  (1.854 m)   Wt 242 lb 6.4 oz (110 kg)   SpO2 97%   BMI 31.98 kg/m  Wt Readings from Last 3 Encounters:  12/05/22 245 lb (111.1 kg)  11/25/22 242 lb 6.4 oz (110 kg)  11/19/22 243 lb 9.7 oz (110.5 kg)    Physical Exam Vitals and nursing note reviewed.  Constitutional:      Appearance: He is well-developed.  HENT:     Head: Normocephalic and atraumatic.  Cardiovascular:     Rate and Rhythm:  Regular rhythm. Tachycardia present.     Heart sounds: Normal heart sounds. No murmur heard.    No friction rub. No gallop.  Pulmonary:     Effort: Pulmonary effort is normal. No tachypnea or respiratory distress.     Breath sounds: Normal breath sounds. No decreased breath sounds, wheezing, rhonchi or rales.  Chest:     Chest wall: No tenderness.  Abdominal:     General: Bowel sounds are normal.     Palpations: Abdomen is soft.  Musculoskeletal:        General: Normal range of motion.     Cervical back: Normal range of motion.  Skin:    General: Skin is warm and dry.  Neurological:     Mental Status: He is alert and oriented to person, place, and time.     Coordination: Coordination normal.  Psychiatric:        Behavior: Behavior normal. Behavior is cooperative.        Thought Content: Thought content normal.        Judgment: Judgment normal.          Patient has been counseled extensively about nutrition and exercise as well as the importance of adherence with medications and regular follow-up. The patient was given clear instructions to go to ER or return to medical center if symptoms don't improve, worsen or new problems develop. The patient verbalized understanding.   Follow-up: Return if symptoms worsen or fail to improve.   [name], FNP-BC [facility] [place], [place] [phone]   12/13/2022, 8:57 PM

## 2022-11-28 NOTE — Progress Notes (Signed)
Second request for pre op orders in [facility]: Left a voicemail for [place].

## 2022-11-28 NOTE — Patient Instructions (Signed)
DUE TO COVID-19 ONLY TWO VISITORS  (aged 61 and older)  ARE ALLOWED TO COME WITH YOU AND STAY IN THE WAITING ROOM ONLY DURING PRE OP AND PROCEDURE.   **NO VISITORS ARE ALLOWED IN THE SHORT STAY AREA OR RECOVERY ROOM!!**  IF YOU WILL BE ADMITTED INTO THE HOSPITAL YOU ARE ALLOWED ONLY FOUR SUPPORT PEOPLE DURING VISITATION HOURS ONLY (7 AM -8PM)   The support person(s) must pass our screening, gel in and out, and wear a mask at all times, including in the patient's room. Patients must also wear a mask when staff or their support person are in the room. Visitors GUEST BADGE MUST BE WORN VISIBLY  One adult visitor may remain with you overnight and MUST be in the room by 8 P.M.     Your procedure is scheduled on: 12/09/22   Report to [facility] Main Entrance    Report to admitting at : 5:15 AM   Call this number if you have problems the morning of surgery [phone]   Do not eat food or drink :After Midnight.  FOLLOW ANY ADDITIONAL PRE OP INSTRUCTIONS YOU RECEIVED FROM YOUR SURGEON'S OFFICE!!!   Oral Hygiene is also important to reduce your risk of infection.                                    Remember - BRUSH YOUR TEETH THE MORNING OF SURGERY WITH YOUR REGULAR TOOTHPASTE  DENTURES WILL BE REMOVED PRIOR TO SURGERY PLEASE DO NOT APPLY "Poly grip" OR ADHESIVES!!!   Do NOT smoke after Midnight   Take these medicines the morning of surgery with A SIP OF WATER: N/A How to Manage Your Diabetes Before and After Surgery  Why is it important to control my blood sugar before and after surgery? Improving blood sugar levels before and after surgery helps healing and can limit problems. A way of improving blood sugar control is eating a healthy diet by:  Eating less sugar and carbohydrates  Increasing activity/exercise  Talking with your doctor about reaching your blood sugar goals High blood sugars (greater than 180 mg/dL) can raise your risk of infections and slow your recovery, so  you will need to focus on controlling your diabetes during the weeks before surgery. Make sure that the doctor who takes care of your diabetes knows about your planned surgery including the date and location.  How do I manage my blood sugar before surgery? Check your blood sugar at least 4 times a day, starting 2 days before surgery, to make sure that the level is not too high or low. Check your blood sugar the morning of your surgery when you wake up and every 2 hours until you get to the Short Stay unit. If your blood sugar is less than 70 mg/dL, you will need to treat for low blood sugar: Do not take insulin. Treat a low blood sugar (less than 70 mg/dL) with  cup of clear juice (cranberry or apple), 4 glucose tablets, OR glucose gel. Recheck blood sugar in 15 minutes after treatment (to make sure it is greater than 70 mg/dL). If your blood sugar is not greater than 70 mg/dL on recheck, call [phone] for further instructions. Report your blood sugar to the short stay nurse when you get to Short Stay.  If you are admitted to the hospital after surgery: Your blood sugar will be checked by the staff and you  will probably be given insulin after surgery (instead of oral diabetes medicines) to make sure you have good blood sugar levels. The goal for blood sugar control after surgery is 80-180 mg/dL.   WHAT DO I DO ABOUT MY DIABETES MEDICATION?  Do not take oral diabetes medicines (pills) the morning of surgery.  THE NIGHT BEFORE SURGERY, take ONLY half of the lantus insulin dose ( 20 units).      THE MORNING OF SURGERY, take ONLY half of the lantus insulin dose (20 units). DO NOT TAKE ANY ORAL DIABETIC MEDICATIONS DAY OF YOUR SURGERY  DO NOT TAKE THE FOLLOWING 7 DAYS PRIOR TO SURGERY: Ozempic, Wegovy, Rybelsus (Semaglutide), Byetta (exenatide), Bydureon (exenatide ER), Victoza, Saxenda (liraglutide), or Trulicity (dulaglutide) Mounjaro (Tirzepatide) Adlyxin (Lixisenatide), Polyethylene  Glycol Loxenatide. Hold Ozempic after: 12/01/22                              You may not have any metal on your body including hair pins, jewelry, and body piercing             Do not wear lotions, powders, perfumes/cologne, or deodorant              Men may shave face and neck.   Do not bring valuables to the hospital. [facility] IS NOT             RESPONSIBLE   FOR VALUABLES.   Contacts, glasses, or bridgework may not be worn into surgery.   Bring small overnight bag day of surgery.   DO NOT BRING YOUR HOME MEDICATIONS TO THE HOSPITAL. PHARMACY WILL DISPENSE MEDICATIONS LISTED ON YOUR MEDICATION LIST TO YOU DURING YOUR ADMISSION IN THE HOSPITAL!    Patients discharged on the day of surgery will not be allowed to drive home.  Someone NEEDS to stay with you for the first 24 hours after anesthesia.   Special Instructions: Bring a copy of your healthcare power of attorney and living will documents         the day of surgery if you haven't scanned them before.              Please read over the following fact sheets you were given: IF YOU HAVE QUESTIONS ABOUT YOUR PRE-OP INSTRUCTIONS PLEASE CALL [phone]    [facility] : [facility] - Preparing for Surgery Before surgery, you can play an important role.  Because skin is not sterile, your skin needs to be as free of germs as possible.  You can reduce the number of germs on your skin by washing with CHG (chlorahexidine gluconate) soap before surgery.  CHG is an antiseptic cleaner which kills germs and bonds with the skin to continue killing germs even after washing. Please DO NOT use if you have an allergy to CHG or antibacterial soaps.  If your skin becomes reddened/irritated stop using the CHG and inform your nurse when you arrive at Short Stay. Do not shave (including legs and underarms) for at least 48 hours prior to the first CHG shower.  You may shave your face/neck. Please follow these instructions carefully:  1.  Shower with CHG Soap the night  before surgery and the  morning of Surgery.  2.  If you choose to wash your hair, wash your hair first as usual with your  normal  shampoo.  3.  After you shampoo, rinse your hair and body thoroughly to remove the  shampoo.  4.  Use CHG as you would any other liquid soap.  You can apply chg directly  to the skin and wash                       Gently with a scrungie or clean washcloth.  5.  Apply the CHG Soap to your body ONLY FROM THE NECK DOWN.   Do not use on face/ open                           Wound or open sores. Avoid contact with eyes, ears mouth and genitals (private parts).                       Wash face,  Genitals (private parts) with your normal soap.             6.  Wash thoroughly, paying special attention to the area where your surgery  will be performed.  7.  Thoroughly rinse your body with warm water from the neck down.  8.  DO NOT shower/wash with your normal soap after using and rinsing off  the CHG Soap.                9.  Pat yourself dry with a clean towel.            10.  Wear clean pajamas.            11.  Place clean sheets on your bed the night of your first shower and do not  sleep with pets. Day of Surgery : Do not apply any lotions/deodorants the morning of surgery.  Please wear clean clothes to the hospital/surgery center.  FAILURE TO FOLLOW THESE INSTRUCTIONS MAY RESULT IN THE CANCELLATION OF YOUR SURGERY PATIENT SIGNATURE_________________________________  NURSE SIGNATURE__________________________________  ________________________________________________________________________

## 2022-11-28 NOTE — Progress Notes (Signed)
Diabetes coordinator was consulted about lantus insulin dose before surgery.She's agree to give instructions to the pt., to tale half of lantus insulin dose the night before,and half of the dose the morning of procedure.

## 2022-11-29 LAB — URINE CULTURE

## 2022-11-30 ENCOUNTER — Ambulatory Visit: Payer: Self-pay

## 2022-11-30 ENCOUNTER — Inpatient Hospital Stay (HOSPITAL_COMMUNITY)
Admission: RE | Admit: 2022-11-30 | Discharge: 2022-11-30 | Disposition: A | Discharge status: Home / Self Care | Payer: Medicaid Other | Source: Ambulatory Visit

## 2022-11-30 NOTE — Telephone Encounter (Signed)
Summary: Possible UTI   Pt has a possible UTI, says he does not have a way to come into the office but believes he needs medication. Please advise     Chief Complaint: Seen in office 11/25/22 with UTI. States "it never really cleared up." Unable to come in for visit today. Asking for additional antibiotic. Symptoms: Pain, burning, frequency, odor to urine. Has stents in place for kidney stone. Frequency: 11/25/22 Pertinent Negatives: Patient denies fever Disposition: [] ED /[] Urgent Care (no appt availability in office) / [] Appointment(In office/virtual)/ []  [facility]/ [] Home Care/ [] Refused Recommended Disposition /[] [place] [facility]/ [x]  Follow-up with PCP Additional Notes: Please advise pt.  Reason for Disposition  Urinating more frequently than usual (i.e., frequency)  Answer Assessment - Initial Assessment Questions 1. SYMPTOM: "What's the main symptom you're concerned about?" (e.g., frequency, incontinence)     Frequency, burning, odor 2. ONSET: "When did the    start?"     11/25/22 3. PAIN: "Is there any pain?" If Yes, ask: "How bad is it?" (Scale: 1-10; mild, moderate, severe)     4-5 4. CAUSE: "What do you think is causing the symptoms?"     UTI 5. OTHER SYMPTOMS: "Do you have any other symptoms?" (e.g., blood in urine, fever, flank pain, pain with urination)     No 6. PREGNANCY: "Is there any chance you are pregnant?" "When was your last menstrual period?"     N/a  Protocols used: Urinary Symptoms-A-AH

## 2022-11-30 NOTE — Telephone Encounter (Signed)
Call received from [facility] - Urology. Patient appointment moved up to Monday 11/30/2022. Patient is aware.

## 2022-11-30 NOTE — Telephone Encounter (Signed)
Spoke with patient. Advised that PCP has been notified of his continued s/s. Call has been placed to request an earlier appointment with [facility] - [facility]. Spoke [place] . Rachael Fee  voiced that she was going to get the request to their provider and see if patient could be worked into their schedule and will call the patient with updated appointment also will call our office to follow-up with [place].

## 2022-12-05 ENCOUNTER — Emergency Department (HOSPITAL_COMMUNITY): Payer: Medicaid Other

## 2022-12-05 ENCOUNTER — Encounter (HOSPITAL_COMMUNITY): Payer: Self-pay | Admitting: Internal Medicine

## 2022-12-05 ENCOUNTER — Other Ambulatory Visit: Payer: Self-pay

## 2022-12-05 ENCOUNTER — Inpatient Hospital Stay (HOSPITAL_COMMUNITY)
Admission: EM | Admit: 2022-12-05 | Discharge: 2022-12-07 | DRG: 699 | Disposition: A | Discharge status: Home / Self Care | Payer: Medicaid Other | Attending: Family Medicine | Admitting: Family Medicine

## 2022-12-05 DIAGNOSIS — N319 Neuromuscular dysfunction of bladder, unspecified: Secondary | ICD-10-CM | POA: Diagnosis present

## 2022-12-05 DIAGNOSIS — I252 Old myocardial infarction: Secondary | ICD-10-CM | POA: Diagnosis not present

## 2022-12-05 DIAGNOSIS — N400 Enlarged prostate without lower urinary tract symptoms: Secondary | ICD-10-CM | POA: Diagnosis present

## 2022-12-05 DIAGNOSIS — Z794 Long term (current) use of insulin: Secondary | ICD-10-CM

## 2022-12-05 DIAGNOSIS — D649 Anemia, unspecified: Secondary | ICD-10-CM | POA: Diagnosis not present

## 2022-12-05 DIAGNOSIS — Z7984 Long term (current) use of oral hypoglycemic drugs: Secondary | ICD-10-CM

## 2022-12-05 DIAGNOSIS — Z87440 Personal history of urinary (tract) infections: Secondary | ICD-10-CM

## 2022-12-05 DIAGNOSIS — Z7982 Long term (current) use of aspirin: Secondary | ICD-10-CM

## 2022-12-05 DIAGNOSIS — Z8546 Personal history of malignant neoplasm of prostate: Secondary | ICD-10-CM

## 2022-12-05 DIAGNOSIS — N200 Calculus of kidney: Secondary | ICD-10-CM | POA: Diagnosis present

## 2022-12-05 DIAGNOSIS — Z960 Presence of urogenital implants: Secondary | ICD-10-CM | POA: Diagnosis present

## 2022-12-05 DIAGNOSIS — Z800 Family history of malignant neoplasm of digestive organs: Secondary | ICD-10-CM

## 2022-12-05 DIAGNOSIS — N390 Urinary tract infection, site not specified: Secondary | ICD-10-CM | POA: Diagnosis present

## 2022-12-05 DIAGNOSIS — I10 Essential (primary) hypertension: Secondary | ICD-10-CM | POA: Diagnosis present

## 2022-12-05 DIAGNOSIS — Z955 Presence of coronary angioplasty implant and graft: Secondary | ICD-10-CM | POA: Diagnosis not present

## 2022-12-05 DIAGNOSIS — Z79899 Other long term (current) drug therapy: Secondary | ICD-10-CM

## 2022-12-05 DIAGNOSIS — Z8249 Family history of ischemic heart disease and other diseases of the circulatory system: Secondary | ICD-10-CM

## 2022-12-05 DIAGNOSIS — I2510 Atherosclerotic heart disease of native coronary artery without angina pectoris: Secondary | ICD-10-CM | POA: Diagnosis present

## 2022-12-05 DIAGNOSIS — N3000 Acute cystitis without hematuria: Secondary | ICD-10-CM

## 2022-12-05 DIAGNOSIS — N3001 Acute cystitis with hematuria: Secondary | ICD-10-CM | POA: Diagnosis present

## 2022-12-05 DIAGNOSIS — Z888 Allergy status to other drugs, medicaments and biological substances status: Secondary | ICD-10-CM

## 2022-12-05 DIAGNOSIS — K567 Ileus, unspecified: Secondary | ICD-10-CM | POA: Diagnosis present

## 2022-12-05 DIAGNOSIS — T83518A Infection and inflammatory reaction due to other urinary catheter, initial encounter: Secondary | ICD-10-CM | POA: Diagnosis present

## 2022-12-05 DIAGNOSIS — F17290 Nicotine dependence, other tobacco product, uncomplicated: Secondary | ICD-10-CM | POA: Diagnosis present

## 2022-12-05 DIAGNOSIS — D696 Thrombocytopenia, unspecified: Secondary | ICD-10-CM | POA: Diagnosis not present

## 2022-12-05 DIAGNOSIS — Z7985 Long-term (current) use of injectable non-insulin antidiabetic drugs: Secondary | ICD-10-CM | POA: Diagnosis not present

## 2022-12-05 DIAGNOSIS — E785 Hyperlipidemia, unspecified: Secondary | ICD-10-CM | POA: Diagnosis present

## 2022-12-05 DIAGNOSIS — Y846 Urinary catheterization as the cause of abnormal reaction of the patient, or of later complication, without mention of misadventure at the time of the procedure: Secondary | ICD-10-CM | POA: Diagnosis present

## 2022-12-05 DIAGNOSIS — E871 Hypo-osmolality and hyponatremia: Secondary | ICD-10-CM | POA: Diagnosis present

## 2022-12-05 DIAGNOSIS — I69398 Other sequelae of cerebral infarction: Secondary | ICD-10-CM

## 2022-12-05 DIAGNOSIS — Z833 Family history of diabetes mellitus: Secondary | ICD-10-CM | POA: Diagnosis not present

## 2022-12-05 DIAGNOSIS — Z8619 Personal history of other infectious and parasitic diseases: Secondary | ICD-10-CM

## 2022-12-05 DIAGNOSIS — E119 Type 2 diabetes mellitus without complications: Secondary | ICD-10-CM | POA: Diagnosis present

## 2022-12-05 LAB — URINALYSIS, ROUTINE W REFLEX MICROSCOPIC
Bilirubin Urine: NEGATIVE
Glucose, UA: >=500 mg/dL — AB
Ketones, ur: NEGATIVE mg/dL
Nitrite: POSITIVE — AB
Protein, ur: 30 mg/dL — AB
RBC / HPF: >50 RBC/hpf (ref 0–5)
Specific Gravity, Urine: 1.017 (ref 1.005–1.030)
WBC, UA: >50 WBC/hpf (ref 0–5)
pH: 6 (ref 5.0–8.0)

## 2022-12-05 LAB — BASIC METABOLIC PANEL
Anion gap: 11 (ref 5–15)
BUN: 19 mg/dL (ref 8–23)
CO2: 20 mmol/L — ABNORMAL LOW (ref 22–32)
Calcium: 8.8 mg/dL — ABNORMAL LOW (ref 8.9–10.3)
Chloride: 101 mmol/L (ref 98–111)
Creatinine, Ser: 0.93 mg/dL (ref 0.61–1.24)
GFR, Estimated: >60 mL/min (ref >60)
Glucose, Bld: 97 mg/dL (ref 70–99)
Potassium: 3.8 mmol/L (ref 3.5–5.1)
Sodium: 132 mmol/L — ABNORMAL LOW (ref 135–145)

## 2022-12-05 LAB — CBC
HCT: 38.2 % — ABNORMAL LOW (ref 39.0–52.0)
Hemoglobin: 12.9 g/dL — ABNORMAL LOW (ref 13.0–17.0)
MCH: 32.9 pg (ref 26.0–34.0)
MCHC: 33.8 g/dL (ref 30.0–36.0)
MCV: 97.4 fL (ref 80.0–100.0)
Platelets: 169 10*3/uL (ref 150–400)
RBC: 3.92 MIL/uL — ABNORMAL LOW (ref 4.22–5.81)
RDW: 13.2 % (ref 11.5–15.5)
WBC: 8.3 10*3/uL (ref 4.0–10.5)
nRBC: 0 % (ref 0.0–0.2)

## 2022-12-05 LAB — CBG MONITORING, ED: Glucose-Capillary: 110 mg/dL — ABNORMAL HIGH (ref 70–99)

## 2022-12-05 LAB — GLUCOSE, CAPILLARY
Glucose-Capillary: 187 mg/dL — ABNORMAL HIGH (ref 70–99)
Glucose-Capillary: 163 mg/dL — ABNORMAL HIGH (ref 70–99)

## 2022-12-05 LAB — HIV ANTIBODY (ROUTINE TESTING W REFLEX): HIV Screen 4th Generation wRfx: NONREACTIVE

## 2022-12-05 LAB — I-STAT CG4 LACTIC ACID, ED
Lactic Acid, Venous: 1.2 mmol/L (ref 0.5–1.9)
Lactic Acid, Venous: 0.8 mmol/L (ref 0.5–1.9)

## 2022-12-05 MED ORDER — SODIUM CHLORIDE 0.9 % IV SOLN
2.0000 g | INTRAVENOUS | Status: DC
  Administered 2022-12-06 – 2022-12-07 (×2): 2 g via INTRAVENOUS
  Filled 2022-12-05 (×2): qty 20

## 2022-12-05 MED ORDER — ALBUTEROL SULFATE (2.5 MG/3ML) 0.083% IN NEBU: 2.5000 mg | INHALATION_SOLUTION | RESPIRATORY_TRACT | Status: DC | PRN

## 2022-12-05 MED ORDER — SODIUM CHLORIDE 0.9 % IV SOLN: 1.0000 g | INTRAVENOUS | Status: DC

## 2022-12-05 MED ORDER — INSULIN GLARGINE-YFGN 100 UNIT/ML ~~LOC~~ SOLN
40.0000 [IU] | Freq: Every day | SUBCUTANEOUS | Status: DC
  Administered 2022-12-06 – 2022-12-07 (×2): 40 [IU] via SUBCUTANEOUS
  Filled 2022-12-05 (×2): qty 0.4

## 2022-12-05 MED ORDER — ENOXAPARIN SODIUM 40 MG/0.4ML IJ SOSY
40.0000 mg | PREFILLED_SYRINGE | INTRAMUSCULAR | Status: DC
  Administered 2022-12-05 – 2022-12-06 (×2): 40 mg via SUBCUTANEOUS
  Filled 2022-12-05 (×2): qty 0.4

## 2022-12-05 MED ORDER — ACETAMINOPHEN 650 MG RE SUPP: 650.0000 mg | Freq: Four times a day (QID) | RECTAL | Status: DC | PRN

## 2022-12-05 MED ORDER — ASPIRIN 81 MG PO TBEC
81.0000 mg | DELAYED_RELEASE_TABLET | Freq: Every day | ORAL | Status: DC
  Administered 2022-12-05 – 2022-12-07 (×3): 81 mg via ORAL
  Filled 2022-12-05 (×3): qty 1

## 2022-12-05 MED ORDER — ONDANSETRON HCL 4 MG/2ML IJ SOLN: 4.0000 mg | Freq: Four times a day (QID) | INTRAMUSCULAR | Status: DC | PRN

## 2022-12-05 MED ORDER — TRAZODONE HCL 50 MG PO TABS
25.0000 mg | ORAL_TABLET | Freq: Every evening | ORAL | Status: DC | PRN
  Administered 2022-12-06: 25 mg via ORAL
  Filled 2022-12-05: qty 1

## 2022-12-05 MED ORDER — ONDANSETRON HCL 4 MG PO TABS: 4.0000 mg | ORAL_TABLET | Freq: Four times a day (QID) | ORAL | Status: DC | PRN

## 2022-12-05 MED ORDER — SODIUM CHLORIDE 0.9 % IV SOLN
1.0000 g | Freq: Once | INTRAVENOUS | Status: AC
  Administered 2022-12-05: 1 g via INTRAVENOUS
  Filled 2022-12-05: qty 10

## 2022-12-05 MED ORDER — ACETAMINOPHEN 325 MG PO TABS
650.0000 mg | ORAL_TABLET | Freq: Four times a day (QID) | ORAL | Status: DC | PRN
  Administered 2022-12-05: 650 mg via ORAL
  Filled 2022-12-05: qty 2

## 2022-12-05 MED ORDER — LOSARTAN POTASSIUM 25 MG PO TABS
25.0000 mg | ORAL_TABLET | Freq: Every day | ORAL | Status: DC
  Administered 2022-12-06 – 2022-12-07 (×2): 25 mg via ORAL
  Filled 2022-12-05 (×2): qty 1

## 2022-12-05 MED ORDER — INSULIN ASPART 100 UNIT/ML IJ SOLN
0.0000 [IU] | Freq: Every day | INTRAMUSCULAR | Status: DC
  Filled 2022-12-05: qty 0.05

## 2022-12-05 MED ORDER — INSULIN ASPART 100 UNIT/ML IJ SOLN
0.0000 [IU] | Freq: Three times a day (TID) | INTRAMUSCULAR | Status: DC
  Administered 2022-12-05: 3 [IU] via SUBCUTANEOUS
  Administered 2022-12-06: 2 [IU] via SUBCUTANEOUS
  Administered 2022-12-06 (×2): 3 [IU] via SUBCUTANEOUS
  Administered 2022-12-07: 2 [IU] via SUBCUTANEOUS
  Filled 2022-12-05: qty 0.15

## 2022-12-05 NOTE — ED Triage Notes (Signed)
Patient brought in by EMS from urgent care with c/o possible infection. Per patient he had a stent placed 1 week ago and followed up with MD who suggested patient come to hospital. He denies N/V/D and denies pain at this time. HX of diabetes. EMS started 20g in L wrist.  117/67 130 95% RA CBG: 122

## 2022-12-05 NOTE — Plan of Care (Signed)

## 2022-12-05 NOTE — H&P (Signed)
History and Physical  [name]:[id number] DOB: 1960/09/09 DOA: 12/05/2022  PCP: [name], NP   Chief Complaint: Fevers, chills, frequent urination  HPI: Shane Bond is a 62 y.o. 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  Seems he was admitted to the hospital at [facility]/24-9/28, during which time he developed urosepsis due to left-sided nephrolithiasis and had stent placement.  Looks like no urine culture was done, however peripheral blood cultures positive for Staphylococcus epidermidis bacteremia.  He was discharged from the hospital on p.o. linezolid, several days later he went to his PCP complaining of dark urine.  His linezolid was discontinued, he was started on a 7-day course of Bactrim which she finished a few days ago.  Says that over the last couple of days, he has had subjective fevers and chills, as well as frequent need for urination.  He therefore came to the hospital for evaluation.  ED Course: In the emergency department he has been afebrile and vital signs are unremarkable.  Lab work was done, shows essentially unremarkable CBC and CMP, normal lactate.  Urinalysis is quite abnormal.  He was given empiric IV Rocephin, and hospitalist contacted for admission.  Review of Systems: Please see HPI for pertinent positives and negatives. A complete 10 system review of systems are otherwise negative.  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   Anemia    Bladder atonia    BPH (benign prostatic hyperplasia)    CAD, NATIVE VESSEL 10/29/2009   Diabetes mellitus type 2, uncontrolled, with complications    Hyperlipidemia LDL goal <70    Hypertension    Kidney stone    Neurogenic bladder as late effect of cerebrovascular accident  (CVA)    OBESITY 10/22/2009   Prostate cancer [facility])    Past Surgical History:  Procedure Laterality Date   BACK SURGERY     CORONARY STENT PLACEMENT     CYSTOSCOPY W/ URETERAL STENT PLACEMENT  02/26/2012   Procedure: CYSTOSCOPY WITH RETROGRADE PYELOGRAM/URETERAL STENT PLACEMENT;  Surgeon: Valetta Fuller, MD;  Location: [facility];  Service: Urology;  Laterality: Left;   CYSTOSCOPY W/ URETERAL STENT PLACEMENT Left 11/16/2022   Procedure: 1. Cystoscopy 2. left ureteral stent placement 3. left retrograde pyelography with interpretation;  Surgeon: [name], MD;  Location: [facility] OR;  Service: Urology;  Laterality: Left;   CYSTOSCOPY WITH RETROGRADE PYELOGRAM, URETEROSCOPY AND STENT PLACEMENT Left 08/07/2019   Procedure: CYSTOSCOPY WITH LEFT RETROGRADE PYELOGRAM, URETEROSCOPY HOLMIUM LASER AND STENT PLACEMENT;  Surgeon: [name], MD;  Location: [facility];  Service: Urology;  Laterality: Left;   CYSTOSCOPY WITH STENT PLACEMENT Left 06/24/2019   Procedure: CYSTOSCOPY WITH STENT PLACEMENT LEFT URETER WITH LEFT RETROGRADE URETERAL;  Surgeon: Rene Paci, MD;  Location: [facility];  Service: Urology;  Laterality: Left;   testicles Right     Social History:  reports that he has been smoking e-cigarettes and cigarettes. He has a 20 pack-year smoking history. He has quit using smokeless tobacco. He reports that he does not currently use alcohol. He reports that he does not use drugs.   Allergies  Allergen Reactions   Lipitor [Atorvastatin] Rash   Lisinopril Rash    Family History  Problem Relation Age of Onset   Hypertension Mother  Diabetes Father    Stomach cancer Maternal Uncle    Diabetes Paternal Aunt    Coronary artery disease Other    Heart attack Other    Breast cancer Neg Hx    Colon cancer Neg Hx    Pancreatic cancer Neg Hx    Prostate cancer Neg Hx    Esophageal cancer Neg Hx    Liver disease Neg Hx      Prior to Admission medications   Medication Sig Start  Date End Date Taking? Authorizing Provider  Aspirin 81 MG CAPS Take 81 mg by mouth daily.   Yes [provider]  dapagliflozin propanediol (FARXIGA) 10 MG TABS tablet Take 1 tablet (10 mg total) by mouth daily before breakfast. 11/25/22  Yes [name], NP  insulin glargine (LANTUS SOLOSTAR) 100 UNIT/ML Solostar Pen Inject 40 Units into the skin 2 (two) times daily. Patient taking differently: Inject 40 Units into the skin See admin instructions. Inject 40 units the skin in the morning and 10-40 units in the evening 09/21/22  Yes Newlin, Enobong, MD  losartan (COZAAR) 25 MG tablet Take 1 tablet (25 mg total) by mouth daily. 09/06/22  Yes Hoy Register, MD  omega-3 acid ethyl esters (LOVAZA) 1 g capsule Take 2 capsules (2 g total) by mouth 2 (two) times daily. Please fill as a 90 day supply 08/15/22  Yes [name], NP  Semaglutide,0.25 or 0.5MG /DOS, (OZEMPIC, 0.25 OR 0.5 MG/DOSE,) 2 MG/3ML SOPN Inject 0.5 mg into the skin once a week. 11/25/22  Yes [name], NP  Accu-Chek Softclix Lancets lancets Use as instructed to check blood glucose level by fingerstick twice per day. 08/15/22   [name], NP  Blood Glucose Monitoring Suppl (ACCU-CHEK GUIDE) w/Device KIT Use as instructed to check blood glucose level by fingerstick twice per day Patient not taking: Reported on 08/15/2022 04/26/21   [name], NP  ezetimibe (ZETIA) 10 MG tablet Take 1 tablet (10 mg total) by mouth daily. 11/25/22   [name], NP  glucose blood (ACCU-CHEK GUIDE) test strip Use as instructed to check blood glucose by fingerstick twice per day. 08/15/22   [name], NP  Insulin Pen Needle (TECHLITE PEN NEEDLES) 32G X 4 MM MISC Inject as directed 2 (two) times daily. 09/06/22   Hoy Register, MD  Misc. Devices (CAREX COCCYX CUSHION) MISC Use daily when sitting for pressure relief ICD10 L98.429 07/02/20   [name], NP  Misc. Devices MISC Please provide patient with standard sized  64F urinary catheters for self cathing along with lubricant. 05/29/18   [name], NP  phenazopyridine (PYRIDIUM) 100 MG tablet Take 1 tablet (100 mg total) by mouth 3 (three) times daily as needed (urinary pain). Patient not taking: Reported on 11/28/2022 09/12/22   Zenia Resides, MD  triamcinolone cream (KENALOG) 0.1 % Apply 1 Application topically 2 (two) times daily. Apply to both hands Patient not taking: Reported on 11/16/2022 08/15/22   [name], NP  gemfibrozil (LOPID) 600 MG tablet Take 1 tablet (600 mg total) by mouth 2 (two) times daily before a meal. 07/20/19 11/04/19  [name], NP    Physical Exam: BP 124/74   Pulse 86   Temp 99.5 F (37.5 C) (Oral)   Resp 16   Ht 6\' 1"  (1.854 m)   Wt 111.1 kg   SpO2 96%   BMI 32.32 kg/m   General:  Alert, oriented, calm, in no acute  distress looks very comfortable and nontoxic Eyes: EOMI, clear conjuctivae, white sclerea Neck: supple, no masses, trachea mildline  Cardiovascular: RRR, no murmurs or rubs, no peripheral edema  Respiratory: clear to auscultation bilaterally, no wheezes, no crackles  Abdomen: soft, nontender, nondistended, normal bowel tones heard  Skin: dry, no rashes  Musculoskeletal: no joint effusions, normal range of motion  Psychiatric: appropriate affect, normal speech  Neurologic: extraocular muscles intact, clear speech, moving all extremities with intact sensorium         Labs on Admission:  Basic Metabolic Panel: Recent Labs  Lab 12/05/22 1105  NA 132*  K 3.8  CL 101  CO2 20*  GLUCOSE 97  BUN 19  CREATININE 0.93  CALCIUM 8.8*   Liver Function Tests: No results for input(s): "AST", "ALT", "ALKPHOS", "BILITOT", "PROT", "ALBUMIN" in the last 168 hours. No results for input(s): "LIPASE", "AMYLASE" in the last 168 hours. No results for input(s): "AMMONIA" in the last 168 hours. CBC: Recent Labs  Lab 12/05/22 1105  WBC 8.3  HGB 12.9*  HCT 38.2*  MCV 97.4  PLT 169   Cardiac  Enzymes: No results for input(s): "CKTOTAL", "CKMB", "CKMBINDEX", "TROPONINI" in the last 168 hours.  BNP (last 3 results) No results for input(s): "BNP" in the last 8760 hours.  ProBNP (last 3 results) No results for input(s): "PROBNP" in the last 8760 hours.  CBG: Recent Labs  Lab 12/05/22 1027  GLUCAP 110*    Radiological Exams on Admission: No results found.  Assessment/Plan RAMEEZ GARTHWAITE is a 62 y.o. 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   Recurrent UTI-in the setting of recent nephrolithiasis status post left-sided stenting.  He is currently not septic.  Concern due to subjective fevers and chills, and increased urinary frequency.  He also has an abnormal urinalysis, unclear if this is colonization in the setting of self-catheterization but this is certainly a possibility.  Urine culture as an outpatient from 10/4 shows E. coli which is sensitive to Rocephin and Bactrim. -Inpatient admission -Follow-up blood and urine cultures -Continue empiric IV Rocephin  Hyponatremia-mild and asymptomatic, encourage fluid intake and follow with morning labs  Insulin-dependent type 2 diabetes -Continue basal bolus insulin -Carb controlled diet  Hypertension-resume Cozaar from the morning  Nephrolithiasis-recent left-sided stent placement, ER provider discussed with urology who did not recommend any specific intervention at this time other than treating potential infection  DVT prophylaxis: Lovenox     Code Status: Full Code  Consults called: None  Admission status: The appropriate patient status for this patient is INPATIENT. Inpatient status is judged to be reasonable and necessary in order to provide the required intensity of service to ensure the patient's safety.  The patient's presenting symptoms, physical exam findings, and initial radiographic and laboratory data in the context of their chronic comorbidities is felt to place them at high risk for further clinical deterioration. Furthermore, it is not anticipated that the patient will be medically stable for discharge from the hospital within 2 midnights of admission.    I certify that at the point of admission it is my clinical judgment that the patient will require inpatient hospital care spanning beyond 2 midnights from the point of admission due to high intensity of service, high risk for further deterioration and high frequency of surveillance required  Time spent:  58 minutes  Mico Spark Sharlette Dense MD [facility] Pager [phone]  If 7PM-7AM, please contact night-coverage [email or web address] Password [email or web address]  12/05/2022, 2:50 PM

## 2022-12-05 NOTE — ED Notes (Signed)
X-ray at bedside

## 2022-12-05 NOTE — ED Provider Notes (Signed)
[place] EMERGENCY DEPARTMENT AT [facility] Provider Note   CSN: [id number] Arrival date & time: 12/05/22  1014     History  Chief Complaint  Patient presents with   Nephrolithiasis    Shane Bond is a 62 y.o. 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  He was discharged on the linezolid but did not finish his antibiotics secondary to "dark urine".  He was placed on Bactrim by his PCP.  He reports some cold chills for the last 2 to 3 days, and generalized lack of energy.  He endorses having to catheterize himself more frequently.  HPI     Home Medications Prior to Admission medications   Medication Sig Start Date End Date Taking? Authorizing Provider  Aspirin 81 MG CAPS Take 81 mg by mouth daily.   Yes [provider]  dapagliflozin propanediol (FARXIGA) 10 MG TABS tablet Take 1 tablet (10 mg total) by mouth daily before breakfast. 11/25/22  Yes [name], NP  insulin glargine (LANTUS SOLOSTAR) 100 UNIT/ML Solostar Pen Inject 40 Units into the skin 2 (two) times daily. Patient taking differently: Inject 40 Units into the skin See admin instructions. Inject 40 units the skin in the morning and 10-40 units in the evening 09/21/22  Yes Newlin, Enobong, MD  losartan (COZAAR) 25 MG tablet Take 1 tablet (25 mg total) by mouth daily. 09/06/22  Yes Hoy Register, MD  omega-3 acid ethyl esters (LOVAZA) 1 g capsule Take 2 capsules (2 g total) by mouth 2 (two) times daily. Please fill as a 90 day supply 08/15/22  Yes [name], NP  Semaglutide,0.25 or  0.5MG /DOS, (OZEMPIC, 0.25 OR 0.5 MG/DOSE,) 2 MG/3ML SOPN Inject 0.5 mg into the skin once a week. 11/25/22  Yes [name], NP  Accu-Chek Softclix Lancets lancets Use as instructed to check blood glucose level by fingerstick twice per day. 08/15/22   [name], NP  Blood Glucose Monitoring Suppl (ACCU-CHEK GUIDE) w/Device KIT Use as instructed to check blood glucose level by fingerstick twice per day Patient not taking: Reported on 08/15/2022 04/26/21   [name], NP  ezetimibe (ZETIA) 10 MG tablet Take 1 tablet (10 mg total) by mouth daily. 11/25/22   [name], NP  glucose blood (ACCU-CHEK GUIDE) test strip Use as instructed to check blood glucose by fingerstick twice per day. 08/15/22   [name], NP  Insulin Pen Needle (TECHLITE PEN NEEDLES) 32G X 4 MM MISC Inject as directed 2 (two) times daily. 09/06/22   Hoy Register, MD  Misc. Devices (CAREX COCCYX CUSHION) MISC Use daily when sitting for pressure relief ICD10 L98.429 07/02/20   [name], NP  Misc. Devices MISC Please provide patient with standard sized 35F urinary catheters for self cathing along with lubricant. 05/29/18   [name], NP  phenazopyridine (PYRIDIUM) 100 MG tablet Take 1 tablet (100 mg total) by mouth 3 (three) times daily as needed (urinary pain). Patient not taking: Reported on 11/28/2022 09/12/22   Zenia Resides, MD  triamcinolone cream (KENALOG) 0.1 % Apply 1 Application topically 2 (two) times daily.  Apply to both hands Patient not taking: Reported on 11/16/2022 08/15/22   [name], NP  gemfibrozil (LOPID) 600 MG tablet Take 1 tablet (600 mg total) by mouth 2 (two) times daily before a meal. 07/20/19 11/04/19  [name], NP      Allergies    Lipitor [atorvastatin] and Lisinopril    Review of Systems   Review of Systems  All other systems reviewed and are negative.   Physical Exam Updated Vital Signs BP 124/74   Pulse 86   Temp 99.5 F (37.5 C) (Oral)    Resp 16   Ht 6\' 1"  (1.854 m)   Wt 111.1 kg   SpO2 96%   BMI 32.32 kg/m  Physical Exam Vitals and nursing note reviewed.  Constitutional:      General: He is not in acute distress.    Appearance: Normal appearance.  HENT:     Head: Normocephalic and atraumatic.  Eyes:     General:        Right eye: No discharge.        Left eye: No discharge.  Cardiovascular:     Rate and Rhythm: Normal rate and regular rhythm.     Heart sounds: No murmur heard.    No friction rub. No gallop.  Pulmonary:     Effort: Pulmonary effort is normal.     Breath sounds: Normal breath sounds.  Abdominal:     General: Bowel sounds are normal.     Palpations: Abdomen is soft.     Comments: Some mild tenderness to palpation in the left flank, no rebound, rigidity, guarding  Skin:    General: Skin is warm and dry.     Capillary Refill: Capillary refill takes less than 2 seconds.  Neurological:     Mental Status: He is alert and oriented to person, place, and time.  Psychiatric:        Mood and Affect: Mood normal.        Behavior: Behavior normal.     ED Results / Procedures / Treatments   Labs (all labs ordered are listed, but only abnormal results are displayed) Labs Reviewed  CBC - Abnormal; Notable for the following components:      Result Value   RBC 3.92 (*)    Hemoglobin 12.9 (*)    HCT 38.2 (*)    All other components within normal limits  BASIC METABOLIC PANEL - Abnormal; Notable for the following components:   Sodium 132 (*)    CO2 20 (*)    Calcium 8.8 (*)    All other components within normal limits  URINALYSIS, ROUTINE W REFLEX MICROSCOPIC - Abnormal; Notable for the following components:   APPearance CLOUDY (*)    Glucose, UA >=500 (*)    Hgb urine dipstick LARGE (*)    Protein, ur 30 (*)    Nitrite POSITIVE (*)    Leukocytes,Ua LARGE (*)    Bacteria, UA MANY (*)    All other components within normal limits  CBG MONITORING, ED - Abnormal; Notable for the following  components:   Glucose-Capillary 110 (*)    All other components within normal limits  CULTURE, BLOOD (ROUTINE X 2)  CULTURE, BLOOD (ROUTINE X 2)  URINE CULTURE  HIV ANTIBODY (ROUTINE TESTING W REFLEX)  I-STAT CG4 LACTIC ACID, ED  I-STAT CG4 LACTIC ACID, ED    EKG None  Radiology No results found.  Procedures Procedures    Medications Ordered in ED Medications  cefTRIAXone (ROCEPHIN) 2 g in sodium chloride 0.9 % 100 mL IVPB (has no administration in time range)  Aspirin CAPS 81 mg (has no administration in time range)  losartan (COZAAR) tablet 25 mg (has no administration in time range)  insulin glargine-yfgn (SEMGLEE) injection 40 Units (has no administration in time range)  enoxaparin (LOVENOX) injection 40 mg (has no administration in time range)  insulin aspart (novoLOG) injection 0-15 Units (has no administration in time range)  insulin aspart (novoLOG) injection 0-5 Units (has no administration in time range)  acetaminophen (TYLENOL) tablet 650 mg (has no administration in time range)    Or  acetaminophen (TYLENOL) suppository 650 mg (has no administration in time range)  traZODone (DESYREL) tablet 25 mg (has no administration in time range)  ondansetron (ZOFRAN) tablet 4 mg (has no administration in time range)    Or  ondansetron (ZOFRAN) injection 4 mg (has no administration in time range)  albuterol (PROVENTIL) (2.5 MG/3ML) 0.083% nebulizer solution 2.5 mg (has no administration in time range)  cefTRIAXone (ROCEPHIN) 1 g in sodium chloride 0.9 % 100 mL IVPB (0 g Intravenous Stopped 12/05/22 1341)    ED Course/ Medical Decision Making/ A&P                                 Medical Decision Making Amount and/or Complexity of Data Reviewed Labs: ordered. Radiology: ordered.   This patient is a 62 y.o. male  who presents to the ED for concern of dysuria, concern for developing urosepsis from urgent care.   Differential diagnoses prior to evaluation: The  emergent differential diagnosis includes, but is not limited to, UTI, pyelonephritis, ongoing nephrolithiasis, complication of recent stent, urosepsis, versus other. This is not an exhaustive differential.   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 Pertinent results include: Sensibly reviewed lab work, imaging, recent hospital admission for stenting, as well as outpatient urology visits.  Notably reviewed urine culture from 10/4 which showed susceptible urinary tract infection, patient was placed on Bactrim, but with no improvement despite apparently susceptible on urine culture.  Physical Exam: Physical exam performed. The pertinent findings include: Some mild tenderness to palpation in the left flank, no rebound, rigidity, guarding , borderline fever, temperature 99.5.  Vital signs otherwise stable.  Lab Tests/Imaging studies: I personally interpreted labs/imaging and the pertinent results include: BMP notable for hyponatremia, sodium 132, CBC without leukocytosis.  UA notable for nitrate positive, leukocyte positive urinary tract infection.  Independently turbid plain film abdominal x-ray which shows appropriate placement of ureteral stent on the left.. I agree with the radiologist interpretation.  Medications: I ordered medication including Rocephin for UTI, urine culture pending.  I have reviewed the patients home medicines and have made adjustments as needed.   Consults: I spoke with Dr. Liliane Shi with urology who did not have any additional acute recommendations but reports if he is failed outpatient therapy he may benefit from hospital admission.  Will consult and hospitalist if any surgical management needed, but he suspects no surgical management will be needed at this time.  Disposition: After consideration of the diagnostic results  and the patients response to treatment, I feel that patient is stable for discharge at this time.   Final Clinical Impression(s) / ED Diagnoses Final diagnoses:  Acute cystitis with hematuria    Rx / DC  Orders ED Discharge Orders     None         [place] 12/05/22 1456    Bethann Berkshire, MD 12/08/22 1622

## 2022-12-05 NOTE — ED Notes (Signed)
ED TO INPATIENT HANDOFF REPORT  Name/Age/Gender Shane Bond 62 y.o. male  Code Status    Code Status Orders  (From admission, onward)           Start     Ordered   12/05/22 1432  Full code  Continuous       Question:  By:  Answer:  Consent: discussion documented in EHR   12/05/22 1432           Code Status History     Date Active Date Inactive Code Status Order ID Comments User Context   11/16/2022 0536 11/19/2022 1654 Full Code [id number]  Faythe Ghee Inpatient   06/20/2019 0019 06/25/2019 1745 Full Code [id number]  Charlsie Quest, MD ED       Home/SNF/Other Home  Chief Complaint UTI (urinary tract infection) [N39.0]  Level of Care/Admitting Diagnosis ED Disposition     ED Disposition  Admit   Condition  --   Comment  Hospital Area: [facility] [[id number]]  Level of Care: Med-Surg [16]  May admit patient to [facility] or [facility] if equivalent level of care is available:: Yes  Covid Evaluation: Asymptomatic - no recent exposure (last 10 days) testing not required  Diagnosis: UTI (urinary tract infection) [782956]  Admitting Physician: Maryln Gottron [[id number]]  Attending Physician: Olexa.Dam, MIR [place] [[id number]]  Certification:: I certify this patient will need inpatient services for at least 2 midnights  Expected Medical Readiness: 12/07/2022          Medical History 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   Anemia    Bladder atonia    BPH (benign prostatic hyperplasia)    CAD, NATIVE VESSEL 10/29/2009   Diabetes mellitus type 2, uncontrolled, with complications    Hyperlipidemia LDL goal <70    Hypertension    Kidney stone    Neurogenic bladder as late effect of cerebrovascular accident (CVA)    OBESITY 10/22/2009   Prostate cancer (HCC)     Allergies Allergies  Allergen Reactions   Lipitor [Atorvastatin] Rash   Lisinopril Rash    IV Location/Drains/Wounds Patient  Lines/Drains/Airways Status     Active Line/Drains/Airways     Name Placement date Placement time Site Days   Peripheral IV 12/05/22 20 G 1" Anterior;Left Wrist 12/05/22  1000  Wrist  less than 1   Peripheral IV 12/05/22 20 G Anterior;Right Forearm 12/05/22  1130  Forearm  less than 1   Ureteral Drain/Stent Left ureter 6 Fr. 06/24/19  1031  Left ureter  1260   Ureteral Drain/Stent Left ureter 6 Fr. 08/07/19  1030  Left ureter  1216   Ureteral Drain/Stent Left ureter 6 Fr. 11/16/22  0418  Left ureter  19            Labs/Imaging Results for orders placed or performed during the hospital encounter of 12/05/22 (from the past 48 hour(s))  CBG monitoring, ED     Status: Abnormal   Collection Time: 12/05/22 10:27 AM  Result Value Ref Range   Glucose-Capillary 110 (H) 70 - 99 mg/dL    Comment: Glucose reference range applies only to samples taken after fasting for at least 8 hours.  CBC     Status: Abnormal   Collection Time: 12/05/22 11:05 AM  Result Value Ref Range   WBC 8.3 4.0 - 10.5 K/uL   RBC 3.92 (L) 4.22 - 5.81 MIL/uL   Hemoglobin 12.9 (L) 13.0 -  17.0 g/dL   HCT 16.1 (L) 09.6 - 04.5 %   MCV 97.4 80.0 - 100.0 fL   MCH 32.9 26.0 - 34.0 pg   MCHC 33.8 30.0 - 36.0 g/dL   RDW 40.9 81.1 - 91.4 %   Platelets 169 150 - 400 K/uL   nRBC 0.0 0.0 - 0.2 %    Comment: Performed at [facility] [address] [place], [place] [address]  Basic metabolic panel     Status: Abnormal   Collection Time: 12/05/22 11:05 AM  Result Value Ref Range   Sodium 132 (L) 135 - 145 mmol/L   Potassium 3.8 3.5 - 5.1 mmol/L   Chloride 101 98 - 111 mmol/L   CO2 20 (L) 22 - 32 mmol/L   Glucose, Bld 97 70 - 99 mg/dL    Comment: Glucose reference range applies only to samples taken after fasting for at least 8 hours.   BUN 19 8 - 23 mg/dL   Creatinine, Ser 6.21 0.61 - 1.24 mg/dL   Calcium 8.8 (L) 8.9 - 10.3 mg/dL   GFR, Estimated >30 >86 mL/min    Comment: (NOTE) Calculated using  the CKD-EPI Creatinine Equation (2021)    Anion gap 11 5 - 15    Comment: Performed at [facility], [address] [place], [place] [address]  Urinalysis, Routine w reflex microscopic -Urine, Clean Catch     Status: Abnormal   Collection Time: 12/05/22 11:05 AM  Result Value Ref Range   Color, Urine YELLOW YELLOW   APPearance CLOUDY (A) CLEAR   Specific Gravity, Urine 1.017 1.005 - 1.030   pH 6.0 5.0 - 8.0   Glucose, UA >=500 (A) NEGATIVE mg/dL   Hgb urine dipstick LARGE (A) NEGATIVE   Bilirubin Urine NEGATIVE NEGATIVE   Ketones, ur NEGATIVE NEGATIVE mg/dL   Protein, ur 30 (A) NEGATIVE mg/dL   Nitrite POSITIVE (A) NEGATIVE   Leukocytes,Ua LARGE (A) NEGATIVE   RBC / HPF >50 0 - 5 RBC/hpf   WBC, UA >50 0 - 5 WBC/hpf   Bacteria, UA MANY (A) NONE SEEN   Squamous Epithelial / HPF 0-5 0 - 5 /HPF   WBC Clumps PRESENT    Mucus PRESENT     Comment: Performed at [facility], [address] [place], [place] [address]  I-Stat CG4 Lactic Acid     Status: None   Collection Time: 12/05/22 11:23 AM  Result Value Ref Range   Lactic Acid, Venous 1.2 0.5 - 1.9 mmol/L  I-Stat CG4 Lactic Acid     Status: None   Collection Time: 12/05/22 12:56 PM  Result Value Ref Range   Lactic Acid, Venous 0.8 0.5 - 1.9 mmol/L   No results found.  Pending Labs Unresulted Labs (From admission, onward)     Start     Ordered   12/06/22 0500  Basic metabolic panel  Tomorrow morning,   R        12/05/22 1432   12/06/22 0500  CBC  Tomorrow morning,   R        12/05/22 1432   12/05/22 1431  HIV Antibody (routine testing w rflx)  (HIV Antibody (Routine testing w reflex) panel)  Once,   R        12/05/22 1432   12/05/22 1238  Urine Culture  Once,   URGENT       Question:  Indication  Answer:  Dysuria   12/05/22 1238   12/05/22  1105  Blood culture (routine x 2)  BLOOD CULTURE X 2,   R (with STAT occurrences)      12/05/22 1105            Vitals/Pain Today's  Vitals   12/05/22 1030 12/05/22 1100 12/05/22 1118 12/05/22 1145  BP:  (!) 145/84  124/74  Pulse:  85 94 86  Resp:   15 16  Temp:   99.5 F (37.5 C)   TempSrc:   Oral   SpO2: 95% 92% 94% 96%  Weight: 111.1 kg     Height: 6\' 1"  (1.854 m)     PainSc: 0-No pain       Isolation Precautions No active isolations  Medications Medications  cefTRIAXone (ROCEPHIN) 2 g in sodium chloride 0.9 % 100 mL IVPB (has no administration in time range)  Aspirin CAPS 81 mg (has no administration in time range)  losartan (COZAAR) tablet 25 mg (has no administration in time range)  insulin glargine-yfgn (SEMGLEE) injection 40 Units (has no administration in time range)  enoxaparin (LOVENOX) injection 40 mg (has no administration in time range)  insulin aspart (novoLOG) injection 0-15 Units (has no administration in time range)  insulin aspart (novoLOG) injection 0-5 Units (has no administration in time range)  acetaminophen (TYLENOL) tablet 650 mg (has no administration in time range)    Or  acetaminophen (TYLENOL) suppository 650 mg (has no administration in time range)  traZODone (DESYREL) tablet 25 mg (has no administration in time range)  ondansetron (ZOFRAN) tablet 4 mg (has no administration in time range)    Or  ondansetron (ZOFRAN) injection 4 mg (has no administration in time range)  albuterol (PROVENTIL) (2.5 MG/3ML) 0.083% nebulizer solution 2.5 mg (has no administration in time range)  cefTRIAXone (ROCEPHIN) 1 g in sodium chloride 0.9 % 100 mL IVPB (0 g Intravenous Stopped 12/05/22 1341)    Mobility walks with device

## 2022-12-06 DIAGNOSIS — N3000 Acute cystitis without hematuria: Secondary | ICD-10-CM | POA: Diagnosis not present

## 2022-12-06 LAB — GLUCOSE, CAPILLARY
Glucose-Capillary: 151 mg/dL — ABNORMAL HIGH (ref 70–99)
Glucose-Capillary: 184 mg/dL — ABNORMAL HIGH (ref 70–99)
Glucose-Capillary: 145 mg/dL — ABNORMAL HIGH (ref 70–99)
Glucose-Capillary: 166 mg/dL — ABNORMAL HIGH (ref 70–99)

## 2022-12-06 LAB — URINE CULTURE

## 2022-12-06 LAB — BASIC METABOLIC PANEL
Anion gap: 9 (ref 5–15)
BUN: 19 mg/dL (ref 8–23)
CO2: 25 mmol/L (ref 22–32)
Calcium: 9 mg/dL (ref 8.9–10.3)
Chloride: 102 mmol/L (ref 98–111)
Creatinine, Ser: 0.91 mg/dL (ref 0.61–1.24)
GFR, Estimated: >60 mL/min (ref >60)
Glucose, Bld: 139 mg/dL — ABNORMAL HIGH (ref 70–99)
Potassium: 4.2 mmol/L (ref 3.5–5.1)
Sodium: 136 mmol/L (ref 135–145)

## 2022-12-06 LAB — CBC
HCT: 36.6 % — ABNORMAL LOW (ref 39.0–52.0)
Hemoglobin: 12.1 g/dL — ABNORMAL LOW (ref 13.0–17.0)
MCH: 32.5 pg (ref 26.0–34.0)
MCHC: 33.1 g/dL (ref 30.0–36.0)
MCV: 98.4 fL (ref 80.0–100.0)
Platelets: 140 10*3/uL — ABNORMAL LOW (ref 150–400)
RBC: 3.72 MIL/uL — ABNORMAL LOW (ref 4.22–5.81)
RDW: 13.4 % (ref 11.5–15.5)
WBC: 5.1 10*3/uL (ref 4.0–10.5)
nRBC: 0 % (ref 0.0–0.2)

## 2022-12-06 NOTE — Plan of Care (Signed)
  Problem: Coping: Goal: Ability to adjust to condition or change in health will improve Outcome: Progressing   Problem: Fluid Volume: Goal: Ability to maintain a balanced intake and output will improve Outcome: Progressing   Problem: Nutritional: Goal: Maintenance of adequate nutrition will improve Outcome: Progressing   Problem: Skin Integrity: Goal: Risk for impaired skin integrity will decrease Outcome: Progressing   Problem: Education: Goal: Knowledge of General Education information will improve Description: Including pain rating scale, medication(s)/side effects and non-pharmacologic comfort measures Outcome: Progressing

## 2022-12-06 NOTE — Plan of Care (Signed)

## 2022-12-06 NOTE — Progress Notes (Signed)
   12/06/22 1241  TOC Brief Assessment  Insurance and Status Reviewed  Patient has primary care physician Yes  Home environment has been reviewed Single family home  Prior level of function: Independent  Prior/Current Home Services No current home services  Social Determinants of Health Reivew SDOH reviewed no interventions necessary  Readmission risk has been reviewed Yes  Transition of care needs no transition of care needs at this time

## 2022-12-06 NOTE — Progress Notes (Signed)
[facility] PROGRESS NOTE  OWENS HARA (DOB: 1961/01/19) YQM:[id number] PCP: [name], NP  Brief Narrative: DAL BLEW is a 62 y.o. 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  Seems he was admitted to the hospital at [facility]/24-9/28, during which time he developed urosepsis due to left-sided nephrolithiasis and had stent placement.  Looks like no urine culture was done, however peripheral blood cultures positive for Staphylococcus epidermidis bacteremia.  He was discharged from the hospital on p.o. linezolid, several days later he went to his PCP complaining of dark urine.  His linezolid was discontinued, he was started on a 7-day course of Bactrim which she finished a few days ago.  Says that over the last couple of days, he has had subjective fevers and chills, as well as frequent need for urination.  He therefore came to the hospital for evaluation.   He was afebrile in the ED. Urinalysis is quite abnormal.  He was given empiric IV Rocephin, and hospitalist contacted for admission.  Subjective: Some chills but improved from when he was at home. No abd discomfort, having flatus and BMs. No new complaints. Bored.  Objective: BP 123/65 (BP Location: Left Arm)   Pulse 81   Temp 98.5 F (36.9 C) (Oral)   Resp 20   Ht 6\' 1"  (1.854 m)   Wt 111.1 kg   SpO2 93%   BMI 32.32 kg/m   Gen: No distress Pulm: Clear, nonlabored  CV: RRR, no MRG or edema GI: Soft, NT, protuberant, +BS Neuro: Alert and oriented.     Assessment & Plan Recurrent UTI: in the setting of recent nephrolithiasis status post left-sided stenting. - Concern given his complex urological status and recent bacteremia with fevers and chills PTA consistent with prior infections leading to  hospitalization. Tmax is 100F.  - Continue ceftriaxone 2g IV q24h per outpatient E. coli urine culture - Follow urine culture here and blood cultures. If no growth on blood cultures and remains afebrile at 48 hours, would be candidate for discharge.  - Continue routine self caths due to neurogenic bladder   Hyponatremia: Resolved.   IDT2DM:  - Continue basal-bolus insulin, at inpatient goal.    HTN:  - Continue losartan   Nephrolithiasis: recent left-sided stent placement, seen in usual position on XR. EDP discussed with urology who did not recommend any specific intervention at this time other than treating potential infection.    Dilated loops of bowel consistent with ileus: Exam reassuring, having BMs.   Pt denies history of CVA, though it is extensively documented. I see no neuroimaging.   Tyrone Nine, MD [facility] [email or web address] 12/06/2022, 12:11 PM

## 2022-12-07 DIAGNOSIS — N3000 Acute cystitis without hematuria: Secondary | ICD-10-CM | POA: Diagnosis not present

## 2022-12-07 LAB — GLUCOSE, CAPILLARY: Glucose-Capillary: 141 mg/dL — ABNORMAL HIGH (ref 70–99)

## 2022-12-07 NOTE — Plan of Care (Signed)

## 2022-12-07 NOTE — Discharge Summary (Signed)
[id number]        Height:       73.0 in Accession #:    [id number]       Weight:       241.0 lb Date of Birth:  Feb 05, 1961         BSA:          2.329 m Patient Age:    62 years         BP:           125/69 mmHg Patient Gender: M                HR:           61 bpm. Exam Location:  Inpatient Procedure: 2D Echo, Cardiac Doppler, Color Doppler and Intracardiac            Opacification Agent Indications:    Bacteremia R78.81  History:        Patient has no prior history of Echocardiogram examinations.                 CAD; Risk Factors:Hypertension, Diabetes, Dyslipidemia and                 Current Smoker.  Sonographer:    Lucendia Herrlich Referring Phys: [id number] Daylene Katayama GHERGHE IMPRESSIONS  1. Left ventricular ejection  fraction, by estimation, is 60 to 65%. The left ventricle has normal function. The left ventricle has no regional wall motion abnormalities. There is mild left ventricular hypertrophy of the lateral segment. Left ventricular  diastolic parameters were normal.  2. Right ventricular systolic function is normal. The right ventricular size is normal.  3. The mitral valve is normal in structure. Mild mitral valve regurgitation. No evidence of mitral stenosis.  4. The aortic valve is tricuspid. Aortic valve regurgitation is not visualized. No aortic stenosis is present.  5. The inferior vena cava is normal in size with greater than 50% respiratory variability, suggesting right atrial pressure of 3 mmHg. FINDINGS  Left Ventricle: Left ventricular ejection fraction, by estimation, is 60 to 65%. The left ventricle has normal function. The left ventricle has no regional wall motion abnormalities. Definity contrast agent was given IV to delineate the left ventricular  endocardial borders. The left ventricular internal cavity size was normal in size. There is mild left ventricular hypertrophy of the lateral segment. Left ventricular diastolic parameters were normal. Normal left ventricular filling pressure. Right Ventricle: The right ventricular size is normal. No increase in right ventricular wall thickness. Right ventricular systolic function is normal. Left Atrium: Left atrial size was normal in size. Right Atrium: Right atrial size was normal in size. Pericardium: There is no evidence of pericardial effusion. Mitral Valve: The mitral valve is normal in structure. Mild mitral valve regurgitation. No evidence of mitral valve stenosis. Tricuspid Valve: The tricuspid valve is normal in structure. Tricuspid valve regurgitation is trivial. No evidence of tricuspid stenosis. Aortic Valve: The aortic valve is tricuspid. Aortic valve regurgitation is not visualized. No aortic stenosis is present. Aortic valve peak gradient measures  5.7 mmHg. Pulmonic Valve: The pulmonic valve was normal in structure. Pulmonic valve regurgitation is not visualized. No evidence of pulmonic stenosis. Aorta: The aortic root is normal in size and structure. Venous: The inferior vena cava is normal in size with greater than 50% respiratory variability, suggesting right atrial pressure of 3 mmHg. IAS/Shunts: No atrial level shunt detected by color flow Doppler.  LEFT VENTRICLE PLAX 2D LVIDd:  [id number]        Height:       73.0 in Accession #:    [id number]       Weight:       241.0 lb Date of Birth:  Feb 05, 1961         BSA:          2.329 m Patient Age:    62 years         BP:           125/69 mmHg Patient Gender: M                HR:           61 bpm. Exam Location:  Inpatient Procedure: 2D Echo, Cardiac Doppler, Color Doppler and Intracardiac            Opacification Agent Indications:    Bacteremia R78.81  History:        Patient has no prior history of Echocardiogram examinations.                 CAD; Risk Factors:Hypertension, Diabetes, Dyslipidemia and                 Current Smoker.  Sonographer:    Lucendia Herrlich Referring Phys: [id number] Daylene Katayama GHERGHE IMPRESSIONS  1. Left ventricular ejection  fraction, by estimation, is 60 to 65%. The left ventricle has normal function. The left ventricle has no regional wall motion abnormalities. There is mild left ventricular hypertrophy of the lateral segment. Left ventricular  diastolic parameters were normal.  2. Right ventricular systolic function is normal. The right ventricular size is normal.  3. The mitral valve is normal in structure. Mild mitral valve regurgitation. No evidence of mitral stenosis.  4. The aortic valve is tricuspid. Aortic valve regurgitation is not visualized. No aortic stenosis is present.  5. The inferior vena cava is normal in size with greater than 50% respiratory variability, suggesting right atrial pressure of 3 mmHg. FINDINGS  Left Ventricle: Left ventricular ejection fraction, by estimation, is 60 to 65%. The left ventricle has normal function. The left ventricle has no regional wall motion abnormalities. Definity contrast agent was given IV to delineate the left ventricular  endocardial borders. The left ventricular internal cavity size was normal in size. There is mild left ventricular hypertrophy of the lateral segment. Left ventricular diastolic parameters were normal. Normal left ventricular filling pressure. Right Ventricle: The right ventricular size is normal. No increase in right ventricular wall thickness. Right ventricular systolic function is normal. Left Atrium: Left atrial size was normal in size. Right Atrium: Right atrial size was normal in size. Pericardium: There is no evidence of pericardial effusion. Mitral Valve: The mitral valve is normal in structure. Mild mitral valve regurgitation. No evidence of mitral valve stenosis. Tricuspid Valve: The tricuspid valve is normal in structure. Tricuspid valve regurgitation is trivial. No evidence of tricuspid stenosis. Aortic Valve: The aortic valve is tricuspid. Aortic valve regurgitation is not visualized. No aortic stenosis is present. Aortic valve peak gradient measures  5.7 mmHg. Pulmonic Valve: The pulmonic valve was normal in structure. Pulmonic valve regurgitation is not visualized. No evidence of pulmonic stenosis. Aorta: The aortic root is normal in size and structure. Venous: The inferior vena cava is normal in size with greater than 50% respiratory variability, suggesting right atrial pressure of 3 mmHg. IAS/Shunts: No atrial level shunt detected by color flow Doppler.  LEFT VENTRICLE PLAX 2D LVIDd:  [id number]        Height:       73.0 in Accession #:    [id number]       Weight:       241.0 lb Date of Birth:  Feb 05, 1961         BSA:          2.329 m Patient Age:    62 years         BP:           125/69 mmHg Patient Gender: M                HR:           61 bpm. Exam Location:  Inpatient Procedure: 2D Echo, Cardiac Doppler, Color Doppler and Intracardiac            Opacification Agent Indications:    Bacteremia R78.81  History:        Patient has no prior history of Echocardiogram examinations.                 CAD; Risk Factors:Hypertension, Diabetes, Dyslipidemia and                 Current Smoker.  Sonographer:    Lucendia Herrlich Referring Phys: [id number] Daylene Katayama GHERGHE IMPRESSIONS  1. Left ventricular ejection  fraction, by estimation, is 60 to 65%. The left ventricle has normal function. The left ventricle has no regional wall motion abnormalities. There is mild left ventricular hypertrophy of the lateral segment. Left ventricular  diastolic parameters were normal.  2. Right ventricular systolic function is normal. The right ventricular size is normal.  3. The mitral valve is normal in structure. Mild mitral valve regurgitation. No evidence of mitral stenosis.  4. The aortic valve is tricuspid. Aortic valve regurgitation is not visualized. No aortic stenosis is present.  5. The inferior vena cava is normal in size with greater than 50% respiratory variability, suggesting right atrial pressure of 3 mmHg. FINDINGS  Left Ventricle: Left ventricular ejection fraction, by estimation, is 60 to 65%. The left ventricle has normal function. The left ventricle has no regional wall motion abnormalities. Definity contrast agent was given IV to delineate the left ventricular  endocardial borders. The left ventricular internal cavity size was normal in size. There is mild left ventricular hypertrophy of the lateral segment. Left ventricular diastolic parameters were normal. Normal left ventricular filling pressure. Right Ventricle: The right ventricular size is normal. No increase in right ventricular wall thickness. Right ventricular systolic function is normal. Left Atrium: Left atrial size was normal in size. Right Atrium: Right atrial size was normal in size. Pericardium: There is no evidence of pericardial effusion. Mitral Valve: The mitral valve is normal in structure. Mild mitral valve regurgitation. No evidence of mitral valve stenosis. Tricuspid Valve: The tricuspid valve is normal in structure. Tricuspid valve regurgitation is trivial. No evidence of tricuspid stenosis. Aortic Valve: The aortic valve is tricuspid. Aortic valve regurgitation is not visualized. No aortic stenosis is present. Aortic valve peak gradient measures  5.7 mmHg. Pulmonic Valve: The pulmonic valve was normal in structure. Pulmonic valve regurgitation is not visualized. No evidence of pulmonic stenosis. Aorta: The aortic root is normal in size and structure. Venous: The inferior vena cava is normal in size with greater than 50% respiratory variability, suggesting right atrial pressure of 3 mmHg. IAS/Shunts: No atrial level shunt detected by color flow Doppler.  LEFT VENTRICLE PLAX 2D LVIDd:  Physician Discharge Summary   Patient: Shane Bond MRN: [id number] DOB: 06/20/1960  Admit date:     12/05/2022  Discharge date: 12/07/22  Discharge Physician: Tyrone Nine   PCP: [name], NP   Recommendations at discharge:  Follow up with urology as recommended for definitive stone care.  Follow up with PCP with recommendation to monitor CBC, BMP, continue diabetes management. Holding farxiga at discharge due to concern for recurrent UTIs.  Follow up finalized blood culture data (drawn 10/14, negative as of time of discharge >48 hours)  Discharge Diagnoses: Principal Problem:   UTI (urinary tract infection)  Hospital Course: Shane Bond is a 62 y.o. 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  Seems he was admitted to the hospital at [facility]/24-9/28, during which time he developed urosepsis due to left-sided nephrolithiasis and had stent placement.  Looks like no urine culture was done, however peripheral blood cultures positive for Staphylococcus epidermidis bacteremia.  He was discharged from the hospital on p.o. linezolid, several days later he went to his PCP complaining of dark urine.  His linezolid was discontinued, he was started on a 7-day course of Bactrim which she finished a few days ago.  Says that over the last couple of days, he has had subjective fevers and chills, as well as frequent need for urination.  He therefore came to the hospital for evaluation.   He was afebrile in the ED. Urinalysis is quite abnormal.  He was given empiric IV Rocephin, and hospitalist contacted for admission. He was observed on IV antibiotics, received 3 days worth. Urine culture is nonclonal, suspect colonization, and blood cultures have remained negative. He's remained  asymptomatic without fever or chills or urinary complaints, so will be discharged in stable condition.   Assessment and Plan: Recurrent UTI: in the setting of recent nephrolithiasis status post left-sided stenting. - Concern given his complex urological status and recent bacteremia with fevers and chills PTA consistent with prior infections leading to hospitalization. Tmax is 100F.  - Received 3 days of IV ceftriaxone 2g IV q24h per outpatient E. coli urine culture. Has no urinary symptoms, no further fevers or chills (no true fevers during admission), normal WBC. Blood cultures negative at 48 hours. He will be discharged without further antibiotics.   - Continue routine self caths due to neurogenic bladder   Hyponatremia: Resolved.   IDT2DM:  - Continue home Tx except, HOLD farxiga due to concern for UTI's, at least pending definitive urological management.    HTN:  - Continue losartan   Nephrolithiasis: recent left-sided stent placement, seen in usual position on XR. EDP discussed with urology who did not recommend any specific intervention at this time other than treating potential infection.   - Follow up with [facility] for definitive management   Dilated loops of bowel consistent with ileus: Exam reassuring, having BMs.    Pt denies history of CVA, though it is extensively documented. I see no neuroimaging.   Anemia, thrombocytopenia: No bleeding. Mild.  - Recheck at follow up.   Consultants: None Procedures performed: None  Disposition: Home Diet recommendation: Carb-modified DISCHARGE MEDICATION: Allergies as of 12/07/2022       Reactions   Lipitor [atorvastatin] Rash   Lisinopril Rash        Medication List  [id number]        Height:       73.0 in Accession #:    [id number]       Weight:       241.0 lb Date of Birth:  Feb 05, 1961         BSA:          2.329 m Patient Age:    62 years         BP:           125/69 mmHg Patient Gender: M                HR:           61 bpm. Exam Location:  Inpatient Procedure: 2D Echo, Cardiac Doppler, Color Doppler and Intracardiac            Opacification Agent Indications:    Bacteremia R78.81  History:        Patient has no prior history of Echocardiogram examinations.                 CAD; Risk Factors:Hypertension, Diabetes, Dyslipidemia and                 Current Smoker.  Sonographer:    Lucendia Herrlich Referring Phys: [id number] Daylene Katayama GHERGHE IMPRESSIONS  1. Left ventricular ejection  fraction, by estimation, is 60 to 65%. The left ventricle has normal function. The left ventricle has no regional wall motion abnormalities. There is mild left ventricular hypertrophy of the lateral segment. Left ventricular  diastolic parameters were normal.  2. Right ventricular systolic function is normal. The right ventricular size is normal.  3. The mitral valve is normal in structure. Mild mitral valve regurgitation. No evidence of mitral stenosis.  4. The aortic valve is tricuspid. Aortic valve regurgitation is not visualized. No aortic stenosis is present.  5. The inferior vena cava is normal in size with greater than 50% respiratory variability, suggesting right atrial pressure of 3 mmHg. FINDINGS  Left Ventricle: Left ventricular ejection fraction, by estimation, is 60 to 65%. The left ventricle has normal function. The left ventricle has no regional wall motion abnormalities. Definity contrast agent was given IV to delineate the left ventricular  endocardial borders. The left ventricular internal cavity size was normal in size. There is mild left ventricular hypertrophy of the lateral segment. Left ventricular diastolic parameters were normal. Normal left ventricular filling pressure. Right Ventricle: The right ventricular size is normal. No increase in right ventricular wall thickness. Right ventricular systolic function is normal. Left Atrium: Left atrial size was normal in size. Right Atrium: Right atrial size was normal in size. Pericardium: There is no evidence of pericardial effusion. Mitral Valve: The mitral valve is normal in structure. Mild mitral valve regurgitation. No evidence of mitral valve stenosis. Tricuspid Valve: The tricuspid valve is normal in structure. Tricuspid valve regurgitation is trivial. No evidence of tricuspid stenosis. Aortic Valve: The aortic valve is tricuspid. Aortic valve regurgitation is not visualized. No aortic stenosis is present. Aortic valve peak gradient measures  5.7 mmHg. Pulmonic Valve: The pulmonic valve was normal in structure. Pulmonic valve regurgitation is not visualized. No evidence of pulmonic stenosis. Aorta: The aortic root is normal in size and structure. Venous: The inferior vena cava is normal in size with greater than 50% respiratory variability, suggesting right atrial pressure of 3 mmHg. IAS/Shunts: No atrial level shunt detected by color flow Doppler.  LEFT VENTRICLE PLAX 2D LVIDd:  [id number]        Height:       73.0 in Accession #:    [id number]       Weight:       241.0 lb Date of Birth:  Feb 05, 1961         BSA:          2.329 m Patient Age:    62 years         BP:           125/69 mmHg Patient Gender: M                HR:           61 bpm. Exam Location:  Inpatient Procedure: 2D Echo, Cardiac Doppler, Color Doppler and Intracardiac            Opacification Agent Indications:    Bacteremia R78.81  History:        Patient has no prior history of Echocardiogram examinations.                 CAD; Risk Factors:Hypertension, Diabetes, Dyslipidemia and                 Current Smoker.  Sonographer:    Lucendia Herrlich Referring Phys: [id number] Daylene Katayama GHERGHE IMPRESSIONS  1. Left ventricular ejection  fraction, by estimation, is 60 to 65%. The left ventricle has normal function. The left ventricle has no regional wall motion abnormalities. There is mild left ventricular hypertrophy of the lateral segment. Left ventricular  diastolic parameters were normal.  2. Right ventricular systolic function is normal. The right ventricular size is normal.  3. The mitral valve is normal in structure. Mild mitral valve regurgitation. No evidence of mitral stenosis.  4. The aortic valve is tricuspid. Aortic valve regurgitation is not visualized. No aortic stenosis is present.  5. The inferior vena cava is normal in size with greater than 50% respiratory variability, suggesting right atrial pressure of 3 mmHg. FINDINGS  Left Ventricle: Left ventricular ejection fraction, by estimation, is 60 to 65%. The left ventricle has normal function. The left ventricle has no regional wall motion abnormalities. Definity contrast agent was given IV to delineate the left ventricular  endocardial borders. The left ventricular internal cavity size was normal in size. There is mild left ventricular hypertrophy of the lateral segment. Left ventricular diastolic parameters were normal. Normal left ventricular filling pressure. Right Ventricle: The right ventricular size is normal. No increase in right ventricular wall thickness. Right ventricular systolic function is normal. Left Atrium: Left atrial size was normal in size. Right Atrium: Right atrial size was normal in size. Pericardium: There is no evidence of pericardial effusion. Mitral Valve: The mitral valve is normal in structure. Mild mitral valve regurgitation. No evidence of mitral valve stenosis. Tricuspid Valve: The tricuspid valve is normal in structure. Tricuspid valve regurgitation is trivial. No evidence of tricuspid stenosis. Aortic Valve: The aortic valve is tricuspid. Aortic valve regurgitation is not visualized. No aortic stenosis is present. Aortic valve peak gradient measures  5.7 mmHg. Pulmonic Valve: The pulmonic valve was normal in structure. Pulmonic valve regurgitation is not visualized. No evidence of pulmonic stenosis. Aorta: The aortic root is normal in size and structure. Venous: The inferior vena cava is normal in size with greater than 50% respiratory variability, suggesting right atrial pressure of 3 mmHg. IAS/Shunts: No atrial level shunt detected by color flow Doppler.  LEFT VENTRICLE PLAX 2D LVIDd:

## 2022-12-07 NOTE — Plan of Care (Signed)
  Problem: Fluid Volume: Goal: Ability to maintain a balanced intake and output will improve Outcome: Progressing   Problem: Nutritional: Goal: Maintenance of adequate nutrition will improve Outcome: Progressing Goal: Progress toward achieving an optimal weight will improve Outcome: Progressing   Problem: Clinical Measurements: Goal: Ability to maintain clinical measurements within normal limits will improve Outcome: Progressing Goal: Will remain free from infection Outcome: Progressing Goal: Diagnostic test results will improve Outcome: Progressing Goal: Respiratory complications will improve Outcome: Progressing Goal: Cardiovascular complication will be avoided Outcome: Progressing   Problem: Activity: Goal: Risk for activity intolerance will decrease Outcome: Progressing   Problem: Pain Managment: Goal: General experience of comfort will improve Outcome: Progressing   Problem: Safety: Goal: Ability to remain free from injury will improve Outcome: Progressing

## 2022-12-08 ENCOUNTER — Other Ambulatory Visit (HOSPITAL_COMMUNITY): Payer: Self-pay

## 2022-12-08 ENCOUNTER — Other Ambulatory Visit: Payer: Self-pay

## 2022-12-09 ENCOUNTER — Other Ambulatory Visit: Payer: Self-pay

## 2022-12-09 ENCOUNTER — Ambulatory Visit: Admit: 2022-12-09 | Payer: Medicaid Other | Admitting: Urology

## 2022-12-09 SURGERY — CYSTOSCOPY/URETEROSCOPY/HOLMIUM LASER/STENT PLACEMENT
Anesthesia: General | Laterality: Left

## 2022-12-10 LAB — CULTURE, BLOOD (ROUTINE X 2)
Culture: NO GROWTH
Culture: NO GROWTH

## 2022-12-13 ENCOUNTER — Encounter: Payer: Self-pay | Admitting: Nurse Practitioner

## 2022-12-14 ENCOUNTER — Ambulatory Visit: Payer: Medicaid Other | Admitting: Nutrition

## 2022-12-14 ENCOUNTER — Ambulatory Visit: Payer: Medicaid Other | Admitting: Physician Assistant

## 2022-12-16 ENCOUNTER — Other Ambulatory Visit: Payer: Self-pay

## 2022-12-16 MED ORDER — CEFDINIR 300 MG PO CAPS
ORAL_CAPSULE | ORAL | 0 refills | Status: DC
  Filled 2022-12-16: qty 20, 10d supply, fill #0

## 2022-12-20 ENCOUNTER — Other Ambulatory Visit: Payer: Self-pay

## 2022-12-20 ENCOUNTER — Emergency Department (HOSPITAL_COMMUNITY): Payer: Medicaid Other

## 2022-12-20 ENCOUNTER — Encounter (HOSPITAL_COMMUNITY): Payer: Self-pay

## 2022-12-20 ENCOUNTER — Observation Stay (HOSPITAL_COMMUNITY)
Admission: EM | Admit: 2022-12-20 | Discharge: 2022-12-21 | Disposition: A | Discharge status: Home / Self Care | Payer: Medicaid Other | Attending: Obstetrics and Gynecology | Admitting: Obstetrics and Gynecology

## 2022-12-20 DIAGNOSIS — Z955 Presence of coronary angioplasty implant and graft: Secondary | ICD-10-CM | POA: Diagnosis not present

## 2022-12-20 DIAGNOSIS — Z8673 Personal history of transient ischemic attack (TIA), and cerebral infarction without residual deficits: Secondary | ICD-10-CM | POA: Diagnosis not present

## 2022-12-20 DIAGNOSIS — N390 Urinary tract infection, site not specified: Principal | ICD-10-CM | POA: Insufficient documentation

## 2022-12-20 DIAGNOSIS — Z23 Encounter for immunization: Secondary | ICD-10-CM | POA: Insufficient documentation

## 2022-12-20 DIAGNOSIS — E785 Hyperlipidemia, unspecified: Secondary | ICD-10-CM | POA: Insufficient documentation

## 2022-12-20 DIAGNOSIS — F17290 Nicotine dependence, other tobacco product, uncomplicated: Secondary | ICD-10-CM | POA: Insufficient documentation

## 2022-12-20 DIAGNOSIS — Z7982 Long term (current) use of aspirin: Secondary | ICD-10-CM | POA: Diagnosis not present

## 2022-12-20 DIAGNOSIS — E119 Type 2 diabetes mellitus without complications: Secondary | ICD-10-CM | POA: Insufficient documentation

## 2022-12-20 DIAGNOSIS — Z8546 Personal history of malignant neoplasm of prostate: Secondary | ICD-10-CM | POA: Insufficient documentation

## 2022-12-20 DIAGNOSIS — N200 Calculus of kidney: Secondary | ICD-10-CM

## 2022-12-20 DIAGNOSIS — Z794 Long term (current) use of insulin: Secondary | ICD-10-CM | POA: Insufficient documentation

## 2022-12-20 DIAGNOSIS — N1330 Unspecified hydronephrosis: Secondary | ICD-10-CM

## 2022-12-20 DIAGNOSIS — D649 Anemia, unspecified: Secondary | ICD-10-CM | POA: Diagnosis not present

## 2022-12-20 DIAGNOSIS — N400 Enlarged prostate without lower urinary tract symptoms: Secondary | ICD-10-CM | POA: Diagnosis present

## 2022-12-20 DIAGNOSIS — Z7985 Long-term (current) use of injectable non-insulin antidiabetic drugs: Secondary | ICD-10-CM | POA: Insufficient documentation

## 2022-12-20 DIAGNOSIS — R109 Unspecified abdominal pain: Secondary | ICD-10-CM | POA: Diagnosis not present

## 2022-12-20 DIAGNOSIS — I2510 Atherosclerotic heart disease of native coronary artery without angina pectoris: Secondary | ICD-10-CM | POA: Insufficient documentation

## 2022-12-20 DIAGNOSIS — Z789 Other specified health status: Secondary | ICD-10-CM | POA: Diagnosis present

## 2022-12-20 DIAGNOSIS — N3090 Cystitis, unspecified without hematuria: Secondary | ICD-10-CM

## 2022-12-20 DIAGNOSIS — N132 Hydronephrosis with renal and ureteral calculous obstruction: Secondary | ICD-10-CM | POA: Diagnosis not present

## 2022-12-20 DIAGNOSIS — I10 Essential (primary) hypertension: Secondary | ICD-10-CM | POA: Insufficient documentation

## 2022-12-20 DIAGNOSIS — R319 Hematuria, unspecified: Principal | ICD-10-CM

## 2022-12-20 DIAGNOSIS — C61 Malignant neoplasm of prostate: Secondary | ICD-10-CM | POA: Diagnosis present

## 2022-12-20 LAB — BASIC METABOLIC PANEL
Anion gap: 12 (ref 5–15)
BUN: 15 mg/dL (ref 8–23)
CO2: 24 mmol/L (ref 22–32)
Calcium: 9.8 mg/dL (ref 8.9–10.3)
Chloride: 100 mmol/L (ref 98–111)
Creatinine, Ser: 1 mg/dL (ref 0.61–1.24)
GFR, Estimated: >60 mL/min (ref >60)
Glucose, Bld: 134 mg/dL — ABNORMAL HIGH (ref 70–99)
Potassium: 4.5 mmol/L (ref 3.5–5.1)
Sodium: 136 mmol/L (ref 135–145)

## 2022-12-20 LAB — URINALYSIS, ROUTINE W REFLEX MICROSCOPIC
Bilirubin Urine: NEGATIVE
Glucose, UA: >=500 mg/dL — AB
Ketones, ur: NEGATIVE mg/dL
Nitrite: NEGATIVE
Protein, ur: 100 mg/dL — AB
RBC / HPF: >50 RBC/hpf (ref 0–5)
Specific Gravity, Urine: 1.012 (ref 1.005–1.030)
WBC, UA: >50 WBC/hpf (ref 0–5)
pH: 6 (ref 5.0–8.0)

## 2022-12-20 LAB — CBC
HCT: 38.6 % — ABNORMAL LOW (ref 39.0–52.0)
Hemoglobin: 12.7 g/dL — ABNORMAL LOW (ref 13.0–17.0)
MCH: 31.8 pg (ref 26.0–34.0)
MCHC: 32.9 g/dL (ref 30.0–36.0)
MCV: 96.7 fL (ref 80.0–100.0)
Platelets: 300 10*3/uL (ref 150–400)
RBC: 3.99 MIL/uL — ABNORMAL LOW (ref 4.22–5.81)
RDW: 12.7 % (ref 11.5–15.5)
WBC: 6.9 10*3/uL (ref 4.0–10.5)
nRBC: 0 % (ref 0.0–0.2)

## 2022-12-20 LAB — GLUCOSE, CAPILLARY: Glucose-Capillary: 169 mg/dL — ABNORMAL HIGH (ref 70–99)

## 2022-12-20 LAB — CBG MONITORING, ED: Glucose-Capillary: 62 mg/dL — ABNORMAL LOW (ref 70–99)

## 2022-12-20 MED ORDER — SODIUM CHLORIDE 0.9 % IV SOLN
2.0000 g | Freq: Once | INTRAVENOUS | Status: AC
  Administered 2022-12-20: 2 g via INTRAVENOUS
  Filled 2022-12-20: qty 20

## 2022-12-20 MED ORDER — SODIUM CHLORIDE 0.9 % IV SOLN
2.0000 g | Freq: Every day | INTRAVENOUS | Status: DC
  Administered 2022-12-21: 2 g via INTRAVENOUS
  Filled 2022-12-20: qty 20

## 2022-12-20 MED ORDER — INSULIN ASPART 100 UNIT/ML IJ SOLN
0.0000 [IU] | Freq: Three times a day (TID) | INTRAMUSCULAR | Status: DC
  Filled 2022-12-20: qty 0.15

## 2022-12-20 MED ORDER — ACETAMINOPHEN 325 MG PO TABS
650.0000 mg | ORAL_TABLET | Freq: Four times a day (QID) | ORAL | Status: DC | PRN
  Administered 2022-12-20: 650 mg via ORAL
  Filled 2022-12-20: qty 2

## 2022-12-20 MED ORDER — SODIUM CHLORIDE 0.9 % IV SOLN: 2.0000 g | Freq: Every day | INTRAVENOUS | Status: DC

## 2022-12-20 MED ORDER — ASPIRIN 81 MG PO CHEW: 81.0000 mg | CHEWABLE_TABLET | Freq: Every day | ORAL | Status: DC

## 2022-12-20 MED ORDER — DAPAGLIFLOZIN PROPANEDIOL 10 MG PO TABS
10.0000 mg | ORAL_TABLET | Freq: Every day | ORAL | Status: DC
  Filled 2022-12-20: qty 1

## 2022-12-20 MED ORDER — PNEUMOCOCCAL 20-VAL CONJ VACC 0.5 ML IM SUSY
0.5000 mL | PREFILLED_SYRINGE | INTRAMUSCULAR | Status: AC
  Administered 2022-12-21: 0.5 mL via INTRAMUSCULAR
  Filled 2022-12-20: qty 0.5

## 2022-12-20 MED ORDER — SODIUM CHLORIDE 0.9 % IV SOLN: INTRAVENOUS | Status: DC

## 2022-12-20 MED ORDER — MORPHINE SULFATE (PF) 4 MG/ML IV SOLN
4.0000 mg | Freq: Once | INTRAVENOUS | Status: AC
  Administered 2022-12-20: 4 mg via INTRAVENOUS
  Filled 2022-12-20: qty 1

## 2022-12-20 MED ORDER — OMEGA-3-ACID ETHYL ESTERS 1 G PO CAPS
2.0000 | ORAL_CAPSULE | Freq: Two times a day (BID) | ORAL | Status: DC
Start: 2022-12-20 — End: 2022-12-21
  Administered 2022-12-20: 2 g via ORAL
  Filled 2022-12-20 (×3): qty 2

## 2022-12-20 MED ORDER — LOSARTAN POTASSIUM 50 MG PO TABS
25.0000 mg | ORAL_TABLET | Freq: Every day | ORAL | Status: DC
  Filled 2022-12-20: qty 1

## 2022-12-20 MED ORDER — ACETAMINOPHEN 650 MG RE SUPP: 650.0000 mg | Freq: Four times a day (QID) | RECTAL | Status: DC | PRN

## 2022-12-20 MED ORDER — ONDANSETRON HCL 4 MG PO TABS: 4.0000 mg | ORAL_TABLET | Freq: Four times a day (QID) | ORAL | Status: DC | PRN

## 2022-12-20 MED ORDER — INSULIN GLARGINE-YFGN 100 UNIT/ML ~~LOC~~ SOLN
40.0000 [IU] | Freq: Two times a day (BID) | SUBCUTANEOUS | Status: DC
  Administered 2022-12-20: 40 [IU] via SUBCUTANEOUS
  Filled 2022-12-20 (×2): qty 0.4

## 2022-12-20 MED ORDER — TRAZODONE HCL 50 MG PO TABS: 25.0000 mg | ORAL_TABLET | Freq: Every evening | ORAL | Status: DC | PRN

## 2022-12-20 MED ORDER — MAGNESIUM HYDROXIDE 400 MG/5ML PO SUSP: 30.0000 mL | Freq: Every day | ORAL | Status: DC | PRN

## 2022-12-20 MED ORDER — INSULIN ASPART 100 UNIT/ML IJ SOLN
0.0000 [IU] | Freq: Every day | INTRAMUSCULAR | Status: DC
  Filled 2022-12-20: qty 0.05

## 2022-12-20 MED ORDER — ONDANSETRON HCL 4 MG/2ML IJ SOLN: 4.0000 mg | Freq: Four times a day (QID) | INTRAMUSCULAR | Status: DC | PRN

## 2022-12-20 NOTE — Assessment & Plan Note (Addendum)
-   We will continue Zetia.

## 2022-12-20 NOTE — H&P (Signed)
[place]   PATIENT NAME: Shane Bond    MR#:  [id number]  DATE OF BIRTH:  1960-10-02  DATE OF ADMISSION:  12/20/2022  PRIMARY CARE PHYSICIAN: [name], NP   Patient is coming from: Home  REQUESTING/REFERRING PHYSICIAN: Schutt, Edsel Petrin, PA-C   CHIEF COMPLAINT:   Chief Complaint  Patient presents with   Hematuria    HISTORY OF PRESENT ILLNESS:  Shane Bond is a 62 y.o. male with medical history significant for hypertension, type diabetes mellitus, dyslipidemia, anemia, prostate cancer, neurogenic bladder requiring self-catheterization, urolithiasis with history of recent left sided nephrolithiasiss/p cysto- ureteroscopy with stent placement, who presented to the emergency room with acute onset of hematuria with left flank pain, that have been intermittent lately.  He denied any nausea or vomiting or diarrhea.  No fever but he has been having chills.  No chest pain or palpitations.  No cough or wheezing or dyspnea.  He denied any dysuria or urinary frequency or urgency.  ED Course: When the patient came to the ER, vital signs were within normal.  Labs revealed unremarkable BMP.  CBC showed anemia with hemoglobin 12.7 and hematocrit 38.6.[name]  UA was positive for UTI.  Urine culture was sent.  EKG as reviewed by me : None Imaging: CT renal stone revealed the following: Interval placement of left ureteral stent. Previously seen left ureteral calculus is no longer visualized, however, moderate left renal hydronephrosis shows no significant change.   Bilateral nephrolithiasis. No evidence of right-sided ureteral calculi or hydronephrosis.   Moderate paraumbilical ventral hernia containing multiple small bowel loops. No evidence of bowel obstruction or strangulation.  The patient was given 2 g of IV Rocephin and 4 mg of IV morphine sulfate.  He will be admitted to a medical observation bed for further evaluation and management. PAST MEDICAL HISTORY:   Past  Medical History:  Diagnosis Date   AMI, INFERIOR WALL 10/22/2009   Anemia    Bladder atonia    BPH (benign prostatic hyperplasia)    CAD, NATIVE VESSEL 10/29/2009   Diabetes mellitus type 2, uncontrolled, with complications    Hyperlipidemia LDL goal <70    Hypertension    Kidney stone    Neurogenic bladder as late effect of cerebrovascular accident (CVA)    OBESITY 10/22/2009   Prostate cancer (HCC)     PAST SURGICAL HISTORY:   Past Surgical History:  Procedure Laterality Date   BACK SURGERY     CORONARY STENT PLACEMENT     CYSTOSCOPY W/ URETERAL STENT PLACEMENT  02/26/2012   Procedure: CYSTOSCOPY WITH RETROGRADE PYELOGRAM/URETERAL STENT PLACEMENT;  Surgeon: Valetta Fuller, MD;  Location: [facility];  Service: Urology;  Laterality: Left;   CYSTOSCOPY W/ URETERAL STENT PLACEMENT Left 11/16/2022   Procedure: 1. Cystoscopy 2. left ureteral stent placement 3. left retrograde pyelography with interpretation;  Surgeon: [name], MD;  Location: [facility] OR;  Service: Urology;  Laterality: Left;   CYSTOSCOPY WITH RETROGRADE PYELOGRAM, URETEROSCOPY AND STENT PLACEMENT Left 08/07/2019   Procedure: CYSTOSCOPY WITH LEFT RETROGRADE PYELOGRAM, URETEROSCOPY HOLMIUM LASER AND STENT PLACEMENT;  Surgeon: [name], MD;  Location: [facility];  Service: Urology;  Laterality: Left;   CYSTOSCOPY WITH STENT PLACEMENT Left 06/24/2019   Procedure: CYSTOSCOPY WITH STENT PLACEMENT LEFT URETER WITH LEFT RETROGRADE URETERAL;  Surgeon: Rene Paci, MD;  Location: [facility];  Service: Urology;  Laterality: Left;   testicles Right     SOCIAL HISTORY:   Social History  Tobacco Use   Smoking status: Every Day    Current packs/day: 0.50    Average packs/day: 0.5 packs/day for 40.0 years (20.0 ttl pk-yrs)    Types: E-cigarettes, Cigarettes   Smokeless tobacco: Former  Substance Use Topics   Alcohol use: Not Currently    Comment: occ    FAMILY HISTORY:   Family History  Problem Relation Age of  Onset   Hypertension Mother    Diabetes Father    Stomach cancer Maternal Uncle    Diabetes Paternal Aunt    Coronary artery disease Other    Heart attack Other    Breast cancer Neg Hx    Colon cancer Neg Hx    Pancreatic cancer Neg Hx    Prostate cancer Neg Hx    Esophageal cancer Neg Hx    Liver disease Neg Hx     DRUG ALLERGIES:   Allergies  Allergen Reactions   Lipitor [Atorvastatin] Rash   Lisinopril Rash    REVIEW OF SYSTEMS:   ROS As per history of present illness. All pertinent systems were reviewed above. Constitutional, HEENT, cardiovascular, respiratory, GI, GU, musculoskeletal, neuro, psychiatric, endocrine, integumentary and hematologic systems were reviewed and are otherwise negative/unremarkable except for positive findings mentioned above in the HPI.   MEDICATIONS AT HOME:   Prior to Admission medications   Medication Sig Start Date End Date Taking? Authorizing Provider  Aspirin 81 MG CAPS Take 81 mg by mouth daily.   Yes [provider]  cefdinir (OMNICEF) 300 MG capsule Take 1 capsule (300 mg total) by mouth 2 (two) times a day for 10 days. 12/16/22  Yes Jamison Neighbor, MD  dapagliflozin propanediol (FARXIGA) 10 MG TABS tablet Take 10 mg by mouth daily.   Yes [provider]  insulin glargine (LANTUS SOLOSTAR) 100 UNIT/ML Solostar Pen Inject 40 Units into the skin 2 (two) times daily. Patient taking differently: Inject 40 Units into the skin See admin instructions. Inject 40 units the skin in the morning and 10-40 units in the evening 09/21/22  Yes Newlin, Enobong, MD  losartan (COZAAR) 25 MG tablet Take 1 tablet (25 mg total) by mouth daily. 09/06/22  Yes Hoy Register, MD  omega-3 acid ethyl esters (LOVAZA) 1 g capsule Take 2 capsules (2 g total) by mouth 2 (two) times daily. Please fill as a 90 day supply 08/15/22  Yes [name], NP  Semaglutide,0.25 or 0.5MG /DOS, (OZEMPIC, 0.25 OR 0.5 MG/DOSE,) 2 MG/3ML SOPN Inject 0.5 mg into the  skin once a week. 11/25/22  Yes [name], NP  Accu-Chek Softclix Lancets lancets Use as instructed to check blood glucose level by fingerstick twice per day. 08/15/22   [name], NP  Blood Glucose Monitoring Suppl (ACCU-CHEK GUIDE) w/Device KIT Use as instructed to check blood glucose level by fingerstick twice per day Patient not taking: Reported on 08/15/2022 04/26/21   [name], NP  ezetimibe (ZETIA) 10 MG tablet Take 1 tablet (10 mg total) by mouth daily. Patient not taking: Reported on 12/20/2022 11/25/22   [name], NP  glucose blood (ACCU-CHEK GUIDE) test strip Use as instructed to check blood glucose by fingerstick twice per day. 08/15/22   [name], NP  Insulin Pen Needle (TECHLITE PEN NEEDLES) 32G X 4 MM MISC Inject as directed 2 (two) times daily. 09/06/22   Hoy Register, MD  Misc. Devices (CAREX COCCYX CUSHION) MISC Use daily when sitting for pressure relief ICD10 L98.429 07/02/20   Meredeth Ide,  Shea Stakes, NP  Misc. Devices MISC Please provide patient with standard sized 50F urinary catheters for self cathing along with lubricant. 05/29/18   [name], NP  gemfibrozil (LOPID) 600 MG tablet Take 1 tablet (600 mg total) by mouth 2 (two) times daily before a meal. 07/20/19 11/04/19  [name], NP      VITAL SIGNS:  Blood pressure 115/69, pulse 93, temperature 98.3 F (36.8 C), temperature source Oral, resp. rate 18, height 6\' 1"  (1.854 m), weight 108.4 kg, SpO2 93%.  PHYSICAL EXAMINATION:  Physical Exam  GENERAL:  61 y.o.-year-old patient lying in the bed with no acute distress.  EYES: Pupils equal, round, reactive to light and accommodation. No scleral icterus. Extraocular muscles intact.  HEENT: Head atraumatic, normocephalic. Oropharynx and nasopharynx clear.  NECK:  Supple, no jugular venous distention. No thyroid enlargement, no tenderness.  LUNGS: Normal breath sounds bilaterally, no wheezing, rales,rhonchi or crepitation. No use of  accessory muscles of respiration.  CARDIOVASCULAR: Regular rate and rhythm, S1, S2 normal. No murmurs, rubs, or gallops.  ABDOMEN: Soft, nondistended, nontender. Bowel sounds present. No organomegaly or mass.  EXTREMITIES: No pedal edema, cyanosis, or clubbing.  NEUROLOGIC: Cranial nerves II through XII are intact. Muscle strength 5/5 in all extremities. Sensation intact. Gait not checked.  PSYCHIATRIC: The patient is alert and oriented x 3.  Normal affect and good eye contact. SKIN: No obvious rash, lesion, or ulcer.   LABORATORY PANEL:   CBC Recent Labs  Lab 12/20/22 1250  WBC 6.9  HGB 12.7*  HCT 38.6*  PLT 300   ------------------------------------------------------------------------------------------------------------------  Chemistries  Recent Labs  Lab 12/20/22 1250  NA 136  K 4.5  CL 100  CO2 24  GLUCOSE 134*  BUN 15  CREATININE 1.00  CALCIUM 9.8   ------------------------------------------------------------------------------------------------------------------  Cardiac Enzymes No results for input(s): "TROPONINI" in the last 168 hours. ------------------------------------------------------------------------------------------------------------------  RADIOLOGY:  CT Renal Stone Study  Result Date: 12/20/2022 CLINICAL DATA:  Right flank pain. Hematuria. Nephrolithiasis. Prostate carcinoma. * Tracking Code: BO * EXAM: CT ABDOMEN AND PELVIS WITHOUT CONTRAST TECHNIQUE: Multidetector CT imaging of the abdomen and pelvis was performed following the standard protocol without IV contrast. RADIATION DOSE REDUCTION: This exam was performed according to the departmental dose-optimization program which includes automated exposure control, adjustment of the mA and/or kV according to patient size and/or use of iterative reconstruction technique. COMPARISON:  11/15/2022 FINDINGS: Lower chest: No acute findings. Hepatobiliary: No mass visualized on this unenhanced exam. Gallbladder  is unremarkable. No evidence of biliary ductal dilatation. Pancreas: No mass or inflammatory process visualized on this unenhanced exam. Spleen:  Within normal limits in size. Adrenals/Urinary tract: There has been placement of a double pigtail left ureteral stent since prior study. Previously seen left ureteral calculus is no longer visualized, however, moderate left renal hydronephrosis shows no significant change. Left renal calculi are again seen, largest measuring 14 mm. 1-2 mm calculus noted in lower pole of right kidney, however, there is no evidence of right-sided hydronephrosis or ureteral calculi. Stomach/Bowel: No evidence of obstruction, inflammatory process, or abnormal fluid collections. A moderate paraumbilical ventral hernia is again seen which contains multiple small bowel loops. No No evidence of bowel obstruction or strangulation. Vascular/Lymphatic: No pathologically enlarged lymph nodes identified. No evidence of abdominal aortic aneurysm. Reproductive:  No mass or other significant abnormality. Other:  None. Musculoskeletal:  No suspicious bone lesions identified. IMPRESSION: Interval placement of left ureteral stent. Previously seen left ureteral calculus is no longer visualized, however,  moderate left renal hydronephrosis shows no significant change. Bilateral nephrolithiasis. No evidence of right-sided ureteral calculi or hydronephrosis. Moderate paraumbilical ventral hernia containing multiple small bowel loops. No evidence of bowel obstruction or strangulation. Electronically Signed   By: Danae Orleans M.D.   On: 12/20/2022 17:01      IMPRESSION AND PLAN:  Assessment and Plan: * Recurrent UTI - The patient be admitted to a medical observation bed. - We will continue her back therapy with IV Rocephin. - We will follow urine culture and sensitivity. - Urology consult to be obtained. - Dr. Alvester Morin was notified about the patient and is aware. - Will hold off aspirin due to his  hematuria.  Hydronephrosis, left - The patient is status post left ureteral stent placement. - Urology consult will be obtained as mentioned above.  Type 2 diabetes mellitus without complications (HCC) - The patient will be on supplement coverage with NovoLog. - We will continue basal coverage. - Will continue [place].  Essential hypertension - We will continue his Cozaar.  Dyslipidemia - We will continue Zetia.   DVT prophylaxis: Lovenox.  Advanced Care Planning:  Code Status: full code.  Family Communication:  The plan of care was discussed in details with the patient (and family). I answered all questions. The patient agreed to proceed with the above mentioned plan. Further management will depend upon hospital course. Disposition Plan: Back to previous home environment Consults called: none.  All the records are reviewed and case discussed with ED provider.  Status is: Observation  I certify that at the time of admission, it is my clinical judgment that the patient will require inpatient hospital care extending more than 2 midnights.                            Dispo: The patient is from: Home              Anticipated d/c is to: Home              Patient currently is not medically stable to d/c.              Difficult to place patient: No  Hannah Beat M.D on 12/20/2022 at 7:56 PM  [facility]   From 7 PM-7 AM, contact night-coverage [email or web address]  CC: Primary care physician; [name], NP

## 2022-12-20 NOTE — Plan of Care (Signed)
  Problem: Education: Goal: Ability to describe self-care measures that may prevent or decrease complications (Diabetes Survival Skills Education) will improve Outcome: Progressing Goal: Individualized Educational Video(s) Outcome: Progressing   Problem: Coping: Goal: Ability to adjust to condition or change in health will improve Outcome: Progressing   

## 2022-12-20 NOTE — ED Provider Notes (Signed)
[place] EMERGENCY DEPARTMENT AT [facility] Provider Note   CSN: [id number] Arrival date & time: 12/20/22  1136     History  Chief Complaint  Patient presents with   Hematuria    Shane Bond is a 62 y.o. male.  With history of hypertension, type 2 diabetes, neurogenic bladder requiring self-catheterization, prostate cancer presenting to the ED for evaluation of hematuria and intermittent left flank pain.  He has a known stone in the left kidney and is following with urology.  Currently has a stent in place.  States he has not followed up to get the stent removed.  Hematuria began this morning.  Described as frank blood in the urine.  He has intermittent left flank pain that began today as well.  He believes it is due to the stone that he knows he already has.  No fevers or chills.  No nausea or vomiting.  No abdominal pain.  No chest pain or shortness of breath.  He is currently on Bactrim for recurrent UTIs.   Hematuria       Home Medications Prior to Admission medications   Medication Sig Start Date End Date Taking? Authorizing Provider  Aspirin 81 MG CAPS Take 81 mg by mouth daily.   Yes [provider]  cefdinir (OMNICEF) 300 MG capsule Take 1 capsule (300 mg total) by mouth 2 (two) times a day for 10 days. 12/16/22  Yes Jamison Neighbor, MD  dapagliflozin propanediol (FARXIGA) 10 MG TABS tablet Take 10 mg by mouth daily.   Yes [provider]  insulin glargine (LANTUS SOLOSTAR) 100 UNIT/ML Solostar Pen Inject 40 Units into the skin 2 (two) times daily. Patient taking differently: Inject 40 Units into the skin See admin instructions. Inject 40 units the skin in the morning and 10-40 units in the evening 09/21/22  Yes Newlin, Enobong, MD  losartan (COZAAR) 25 MG tablet Take 1 tablet (25 mg total) by mouth daily. 09/06/22  Yes Hoy Register, MD  omega-3 acid ethyl esters (LOVAZA) 1 g capsule Take 2 capsules (2 g total) by mouth 2 (two) times  daily. Please fill as a 90 day supply 08/15/22  Yes [name], NP  Semaglutide,0.25 or 0.5MG /DOS, (OZEMPIC, 0.25 OR 0.5 MG/DOSE,) 2 MG/3ML SOPN Inject 0.5 mg into the skin once a week. 11/25/22  Yes [name], NP  Accu-Chek Softclix Lancets lancets Use as instructed to check blood glucose level by fingerstick twice per day. 08/15/22   [name], NP  Blood Glucose Monitoring Suppl (ACCU-CHEK GUIDE) w/Device KIT Use as instructed to check blood glucose level by fingerstick twice per day Patient not taking: Reported on 08/15/2022 04/26/21   [name], NP  ezetimibe (ZETIA) 10 MG tablet Take 1 tablet (10 mg total) by mouth daily. Patient not taking: Reported on 12/20/2022 11/25/22   [name], NP  glucose blood (ACCU-CHEK GUIDE) test strip Use as instructed to check blood glucose by fingerstick twice per day. 08/15/22   [name], NP  Insulin Pen Needle (TECHLITE PEN NEEDLES) 32G X 4 MM MISC Inject as directed 2 (two) times daily. 09/06/22   Hoy Register, MD  Misc. Devices (CAREX COCCYX CUSHION) MISC Use daily when sitting for pressure relief ICD10 L98.429 07/02/20   [name], NP  Misc. Devices MISC Please provide patient with standard sized 39F urinary catheters for self cathing along with lubricant. 05/29/18   [name], NP  gemfibrozil (LOPID) 600 MG  tablet Take 1 tablet (600 mg total) by mouth 2 (two) times daily before a meal. 07/20/19 11/04/19  [name], NP      Allergies    Lipitor [atorvastatin] and Lisinopril    Review of Systems   Review of Systems  Genitourinary:  Positive for flank pain and hematuria.  All other systems reviewed and are negative.   Physical Exam Updated Vital Signs BP 133/80   Pulse 73   Temp 98.4 F (36.9 C)   Resp 12   Ht 6\' 1"  (1.854 m)   Wt 108.4 kg   SpO2 98%   BMI 31.53 kg/m  Physical Exam Vitals and nursing note reviewed.  Constitutional:      General: He is not in acute distress.     Appearance: He is well-developed.     Comments: Resting comfortably in bed  HENT:     Head: Normocephalic and atraumatic.  Eyes:     Conjunctiva/sclera: Conjunctivae normal.  Cardiovascular:     Rate and Rhythm: Normal rate and regular rhythm.     Heart sounds: No murmur heard. Pulmonary:     Effort: Pulmonary effort is normal. No respiratory distress.     Breath sounds: Normal breath sounds. No wheezing, rhonchi or rales.  Abdominal:     Palpations: Abdomen is soft.     Tenderness: There is no abdominal tenderness. There is no right CVA tenderness, left CVA tenderness or guarding.  Musculoskeletal:        General: No swelling.     Cervical back: Neck supple.  Skin:    General: Skin is warm and dry.     Capillary Refill: Capillary refill takes less than 2 seconds.  Neurological:     Mental Status: He is alert.  Psychiatric:        Mood and Affect: Mood normal.     ED Results / Procedures / Treatments   Labs (all labs ordered are listed, but only abnormal results are displayed) Labs Reviewed  URINALYSIS, ROUTINE W REFLEX MICROSCOPIC - Abnormal; Notable for the following components:      Result Value   Color, Urine RED (*)    APPearance CLOUDY (*)    Glucose, UA >=500 (*)    Hgb urine dipstick MODERATE (*)    Protein, ur 100 (*)    Leukocytes,Ua LARGE (*)    Bacteria, UA MANY (*)    All other components within normal limits  BASIC METABOLIC PANEL - Abnormal; Notable for the following components:   Glucose, Bld 134 (*)    All other components within normal limits  CBC - Abnormal; Notable for the following components:   RBC 3.99 (*)    Hemoglobin 12.7 (*)    HCT 38.6 (*)    All other components within normal limits  CBG MONITORING, ED - Abnormal; Notable for the following components:   Glucose-Capillary 62 (*)    All other components within normal limits  URINE CULTURE  BASIC METABOLIC PANEL  CBC    EKG None  Radiology CT Renal Stone Study  Result Date:  12/20/2022 CLINICAL DATA:  Right flank pain. Hematuria. Nephrolithiasis. Prostate carcinoma. * Tracking Code: BO * EXAM: CT ABDOMEN AND PELVIS WITHOUT CONTRAST TECHNIQUE: Multidetector CT imaging of the abdomen and pelvis was performed following the standard protocol without IV contrast. RADIATION DOSE REDUCTION: This exam was performed according to the departmental dose-optimization program which includes automated exposure control, adjustment of the mA and/or kV according to patient size and/or use  of iterative reconstruction technique. COMPARISON:  11/15/2022 FINDINGS: Lower chest: No acute findings. Hepatobiliary: No mass visualized on this unenhanced exam. Gallbladder is unremarkable. No evidence of biliary ductal dilatation. Pancreas: No mass or inflammatory process visualized on this unenhanced exam. Spleen:  Within normal limits in size. Adrenals/Urinary tract: There has been placement of a double pigtail left ureteral stent since prior study. Previously seen left ureteral calculus is no longer visualized, however, moderate left renal hydronephrosis shows no significant change. Left renal calculi are again seen, largest measuring 14 mm. 1-2 mm calculus noted in lower pole of right kidney, however, there is no evidence of right-sided hydronephrosis or ureteral calculi. Stomach/Bowel: No evidence of obstruction, inflammatory process, or abnormal fluid collections. A moderate paraumbilical ventral hernia is again seen which contains multiple small bowel loops. No No evidence of bowel obstruction or strangulation. Vascular/Lymphatic: No pathologically enlarged lymph nodes identified. No evidence of abdominal aortic aneurysm. Reproductive:  No mass or other significant abnormality. Other:  None. Musculoskeletal:  No suspicious bone lesions identified. IMPRESSION: Interval placement of left ureteral stent. Previously seen left ureteral calculus is no longer visualized, however, moderate left renal hydronephrosis  shows no significant change. Bilateral nephrolithiasis. No evidence of right-sided ureteral calculi or hydronephrosis. Moderate paraumbilical ventral hernia containing multiple small bowel loops. No evidence of bowel obstruction or strangulation. Electronically Signed   By: Danae Orleans M.D.   On: 12/20/2022 17:01    Procedures Procedures    Medications Ordered in ED Medications  aspirin chewable tablet 81 mg (has no administration in time range)  losartan (COZAAR) tablet 25 mg (has no administration in time range)  omega-3 acid ethyl esters (LOVAZA) capsule 2 g (has no administration in time range)  dapagliflozin propanediol (FARXIGA) tablet 10 mg (has no administration in time range)  insulin glargine-yfgn (SEMGLEE) injection 40 Units (has no administration in time range)  0.9 %  sodium chloride infusion ( Intravenous New Bag/Given 12/20/22 1906)  acetaminophen (TYLENOL) tablet 650 mg (has no administration in time range)    Or  acetaminophen (TYLENOL) suppository 650 mg (has no administration in time range)  traZODone (DESYREL) tablet 25 mg (has no administration in time range)  magnesium hydroxide (MILK OF MAGNESIA) suspension 30 mL (has no administration in time range)  ondansetron (ZOFRAN) tablet 4 mg (has no administration in time range)    Or  ondansetron (ZOFRAN) injection 4 mg (has no administration in time range)  insulin aspart (novoLOG) injection 0-15 Units ( Subcutaneous Not Given 12/20/22 1906)  insulin aspart (novoLOG) injection 0-5 Units (has no administration in time range)  cefTRIAXone (ROCEPHIN) 2 g in sodium chloride 0.9 % 100 mL IVPB (2 g Intravenous New Bag/Given 12/20/22 1827)  morphine (PF) 4 MG/ML injection 4 mg (4 mg Intravenous Given 12/20/22 1841)    ED Course/ Medical Decision Making/ A&P Clinical Course as of 12/20/22 1909  Tue Dec 20, 2022  1712 Recurrent UTI, recent admission for urosepsis, has 14 mm stone in left with hydro, has stent in place, on  bactrim, hematuria and flank pain began today, urine looks infected, no evidence of SIRS [AS]  1810 Spoke with urology.  Dr. Alvester Morin will see patient in consult.  Recommend IV Rocephin, admission to hospitalist [AS]  1837 Spoke with hospitalist who admit [AS]    Clinical Course User Index [AS] Michelle Piper, PA-C  Medical Decision Making Amount and/or Complexity of Data Reviewed Labs: ordered. Radiology: ordered.  Risk Prescription drug management. Decision regarding hospitalization.  This patient presents to the ED for concern of hematuria, left flank pain, this involves an extensive number of treatment options, and is a complaint that carries with it a high risk of complications and morbidity.  The differential diagnosis for hematuria includes but is not limited to cystitis, urinary calculi, BPH, renal cell carcinoma, transitional cell carcinoma, glomerulonephritis, polycystic kidney disease, anticoagulant usage, prostate cancer, papillary necrosis (EGD in sig abuse, DM, sickle cell trait or disease), renal infarction, interstitial nephritis, medullary sponge kidney, radiation or chemical cystitis (e.g. Cyclophosphamide), schistosomiasis, menses, urethra his, urethral diverticula.   My initial workup includes labs, imaging.  Patient denies pain or nausea at this time  Additional history obtained from: Nursing notes from this visit. Previous records within EMR system Hospital admissions on 11/15/2022 and 12/05/2022 for ureterolithiasis with resistant UTI  I ordered, reviewed and interpreted labs which include: CBC, BMP, urinalysis, urine culture.  Mild anemia with a hemoglobin of 12.7.  No leukocytosis.  Normal kidney function and electrolytes.  Urine with moderate hemoglobin, large leukocytes, greater than 50 red blood cells and white blood cells, many bacteria  I ordered imaging studies including CT stone study I independently visualized and  interpreted imaging which showed left ureteral stent, bilateral nephrolithiasis, left hydronephrosis I agree with the radiologist interpretation  Consultations Obtained:  I requested consultation with the urology,  and discussed lab and imaging findings as well as pertinent plan - they recommend: Admission, IV antibiotics, will consult tomorrow  Afebrile, hemodynamically stable.  62 year old male presents ED for evaluation of sudden onset hematuria and intermittent left flank pain.  Has an extensive recent history of kidney stones.  He has been admitted for kidney stones twice.  Was recently admitted for urosepsis.  He was treated with IV antibiotics and discharged on oral antibiotics.  He was transition from oral linezolid to Bactrim and has been on Bactrim for approximately 1 week.  He was seen by [facility] while in the hospital, but transitioned to [facility] urology after discharge.  Was seen 6 days prior, but needed imaging to confirm placement of stents prior to stent being removed.  Lab workup today revealed an obviously infected urine with significant blood.  Urine culture was sent.  No significant anemia.  He denies any abdominal pain.  He self caths due to neurogenic bladder.  Patient appears to have failed outpatient antibiotic therapy and requires IV antibiotics.  Consulted with urology who will see the patient in consult but recommends admission.  Was started on IV Rocephin pending culture results.  Patient is in agreement with admission.  Consulted with hospitalist.  Stable at the time of admission.  Patient's case discussed with Dr. Jearld Fenton who agrees with plan to admit.   Note: Portions of this report may have been transcribed using voice recognition software. Every effort was made to ensure accuracy; however, inadvertent computerized transcription errors may still be present.        Final Clinical Impression(s) / ED Diagnoses Final diagnoses:  Hematuria, unspecified type   Cystitis  Nephrolithiasis    Rx / DC Orders ED Discharge Orders     None         Michelle Piper, Cordelia Poche 12/20/22 1909    Loetta Rough, MD 12/22/22 [phone]

## 2022-12-20 NOTE — Assessment & Plan Note (Signed)
-   We will continue his Cozaar.

## 2022-12-20 NOTE — ED Notes (Signed)
ED TO INPATIENT HANDOFF REPORT  ED Nurse Name and Phone #: Jacqulyn Liner EMTP  S Name/Age/Gender Shane Bond 62 y.o. male Room/Bed: WA07/WA07  Code Status   Code Status: Full Code  Home/SNF/Other Home Patient oriented to: self, place, time, and situation Is this baseline? Yes   Triage Complete: Triage complete  Chief Complaint Recurrent UTI [N39.0]  Triage Note Pt presenting today after noticing blood in his urine this morning. Pt has a hx of kidney stone, and stent placement. Pt endorses intermittent pain in right flank area.  Pt takes daily baby aspirin.   Allergies Allergies  Allergen Reactions   Lipitor [Atorvastatin] Rash   Lisinopril Rash    Level of Care/Admitting Diagnosis ED Disposition     ED Disposition  Admit   Condition  --   Comment  Hospital Area: [facility] [[id number]]  Level of Care: Med-Surg [16]  May place patient in observation at [facility] or [facility] if equivalent level of care is available:: No  Covid Evaluation: Asymptomatic - no recent exposure (last 10 days) testing not required  Diagnosis: Recurrent UTI [284132]  Admitting Physician: Hannah Beat [[id number]]  Attending Physician: Hannah Beat [[id number]]          B Medical/Surgery History 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   Anemia    Bladder atonia    BPH (benign prostatic hyperplasia)    CAD, NATIVE VESSEL 10/29/2009   Diabetes mellitus type 2, uncontrolled, with complications    Hyperlipidemia LDL goal <70    Hypertension    Kidney stone    Neurogenic bladder as late effect of cerebrovascular accident (CVA)    OBESITY 10/22/2009   Prostate cancer (HCC)    Past Surgical History:  Procedure Laterality Date   BACK SURGERY     CORONARY STENT PLACEMENT     CYSTOSCOPY W/ URETERAL STENT PLACEMENT  02/26/2012   Procedure: CYSTOSCOPY WITH RETROGRADE PYELOGRAM/URETERAL STENT PLACEMENT;  Surgeon: Valetta Fuller, MD;   Location: [facility];  Service: Urology;  Laterality: Left;   CYSTOSCOPY W/ URETERAL STENT PLACEMENT Left 11/16/2022   Procedure: 1. Cystoscopy 2. left ureteral stent placement 3. left retrograde pyelography with interpretation;  Surgeon: [name], MD;  Location: [facility] OR;  Service: Urology;  Laterality: Left;   CYSTOSCOPY WITH RETROGRADE PYELOGRAM, URETEROSCOPY AND STENT PLACEMENT Left 08/07/2019   Procedure: CYSTOSCOPY WITH LEFT RETROGRADE PYELOGRAM, URETEROSCOPY HOLMIUM LASER AND STENT PLACEMENT;  Surgeon: [name], MD;  Location: [facility];  Service: Urology;  Laterality: Left;   CYSTOSCOPY WITH STENT PLACEMENT Left 06/24/2019   Procedure: CYSTOSCOPY WITH STENT PLACEMENT LEFT URETER WITH LEFT RETROGRADE URETERAL;  Surgeon: Rene Paci, MD;  Location: [facility];  Service: Urology;  Laterality: Left;   testicles Right      A IV Location/Drains/Wounds Patient Lines/Drains/Airways Status     Active Line/Drains/Airways     Name Placement date Placement time Site Days   Peripheral IV 12/20/22 20 G Posterior;Right Wrist 12/20/22  1548  Wrist  less than 1   Ureteral Drain/Stent Left ureter 6 Fr. 06/24/19  1031  Left ureter  1275   Ureteral Drain/Stent Left ureter 6 Fr. 08/07/19  1030  Left ureter  1231   Ureteral Drain/Stent Left ureter 6 Fr. 11/16/22  0418  Left ureter  34            Intake/Output Last 24 hours No intake or output data in the 24  hours ending 12/20/22 1917  Labs/Imaging Results for orders placed or performed during the hospital encounter of 12/20/22 (from the past 48 hour(s))  Urinalysis, Routine w reflex microscopic -Urine, Clean Catch     Status: Abnormal   Collection Time: 12/20/22 12:50 PM  Result Value Ref Range   Color, Urine RED (A) YELLOW    Comment: BIOCHEMICALS MAY BE AFFECTED BY COLOR   APPearance CLOUDY (A) CLEAR   Specific Gravity, Urine 1.012 1.005 - 1.030   pH 6.0 5.0 - 8.0   Glucose, UA >=500 (A) NEGATIVE mg/dL   Hgb urine  dipstick MODERATE (A) NEGATIVE   Bilirubin Urine NEGATIVE NEGATIVE   Ketones, ur NEGATIVE NEGATIVE mg/dL   Protein, ur 086 (A) NEGATIVE mg/dL   Nitrite NEGATIVE NEGATIVE   Leukocytes,Ua LARGE (A) NEGATIVE   RBC / HPF >50 0 - 5 RBC/hpf   WBC, UA >50 0 - 5 WBC/hpf   Bacteria, UA MANY (A) NONE SEEN   Squamous Epithelial / HPF 0-5 0 - 5 /HPF   Budding Yeast PRESENT     Comment: Performed at [facility], [address], [place], [place] [address]  Basic metabolic panel     Status: Abnormal   Collection Time: 12/20/22 12:50 PM  Result Value Ref Range   Sodium 136 135 - 145 mmol/L   Potassium 4.5 3.5 - 5.1 mmol/L   Chloride 100 98 - 111 mmol/L   CO2 24 22 - 32 mmol/L   Glucose, Bld 134 (H) 70 - 99 mg/dL    Comment: Glucose reference range applies only to samples taken after fasting for at least 8 hours.   BUN 15 8 - 23 mg/dL   Creatinine, Ser 9.62 0.61 - 1.24 mg/dL   Calcium 9.8 8.9 - 95.2 mg/dL   GFR, Estimated >84 >13 mL/min    Comment: (NOTE) Calculated using the CKD-EPI Creatinine Equation (2021)    Anion gap 12 5 - 15    Comment: Performed at [facility], [address], [place], [place] [address]  CBC     Status: Abnormal   Collection Time: 12/20/22 12:50 PM  Result Value Ref Range   WBC 6.9 4.0 - 10.5 K/uL   RBC 3.99 (L) 4.22 - 5.81 MIL/uL   Hemoglobin 12.7 (L) 13.0 - 17.0 g/dL   HCT 02.7 (L) 25.3 - 66.4 %   MCV 96.7 80.0 - 100.0 fL   MCH 31.8 26.0 - 34.0 pg   MCHC 32.9 30.0 - 36.0 g/dL   RDW 40.3 47.4 - 25.9 %   Platelets 300 150 - 400 K/uL   nRBC 0.0 0.0 - 0.2 %    Comment: Performed at [facility], [address] [place], [place] [address]  CBG monitoring, ED     Status: Abnormal   Collection Time: 12/20/22  7:06 PM  Result Value Ref Range   Glucose-Capillary 62 (L) 70 - 99 mg/dL    Comment: Glucose reference range applies only to samples taken after fasting for at least 8 hours.   CT Renal Stone  Study  Result Date: 12/20/2022 CLINICAL DATA:  Right flank pain. Hematuria. Nephrolithiasis. Prostate carcinoma. * Tracking Code: BO * EXAM: CT ABDOMEN AND PELVIS WITHOUT CONTRAST TECHNIQUE: Multidetector CT imaging of the abdomen and pelvis was performed following the standard protocol without IV contrast. RADIATION DOSE REDUCTION: This exam was performed according to the departmental dose-optimization program which includes automated exposure control, adjustment of the mA and/or kV according to  patient size and/or use of iterative reconstruction technique. COMPARISON:  11/15/2022 FINDINGS: Lower chest: No acute findings. Hepatobiliary: No mass visualized on this unenhanced exam. Gallbladder is unremarkable. No evidence of biliary ductal dilatation. Pancreas: No mass or inflammatory process visualized on this unenhanced exam. Spleen:  Within normal limits in size. Adrenals/Urinary tract: There has been placement of a double pigtail left ureteral stent since prior study. Previously seen left ureteral calculus is no longer visualized, however, moderate left renal hydronephrosis shows no significant change. Left renal calculi are again seen, largest measuring 14 mm. 1-2 mm calculus noted in lower pole of right kidney, however, there is no evidence of right-sided hydronephrosis or ureteral calculi. Stomach/Bowel: No evidence of obstruction, inflammatory process, or abnormal fluid collections. A moderate paraumbilical ventral hernia is again seen which contains multiple small bowel loops. No No evidence of bowel obstruction or strangulation. Vascular/Lymphatic: No pathologically enlarged lymph nodes identified. No evidence of abdominal aortic aneurysm. Reproductive:  No mass or other significant abnormality. Other:  None. Musculoskeletal:  No suspicious bone lesions identified. IMPRESSION: Interval placement of left ureteral stent. Previously seen left ureteral calculus is no longer visualized, however, moderate left  renal hydronephrosis shows no significant change. Bilateral nephrolithiasis. No evidence of right-sided ureteral calculi or hydronephrosis. Moderate paraumbilical ventral hernia containing multiple small bowel loops. No evidence of bowel obstruction or strangulation. Electronically Signed   By: Danae Orleans M.D.   On: 12/20/2022 17:01    Pending Labs Unresulted Labs (From admission, onward)     Start     Ordered   12/21/22 0500  Basic metabolic panel  Tomorrow morning,   R        12/20/22 1843   12/21/22 0500  CBC  Tomorrow morning,   R        12/20/22 1843   12/20/22 1418  Urine Culture  Once,   URGENT       Question:  Indication  Answer:  Dysuria   12/20/22 1417            Vitals/Pain Today's Vitals   12/20/22 1239 12/20/22 1447 12/20/22 1752 12/20/22 1913  BP:  120/85 133/80 109/73  Pulse:  82 73   Resp:  16 12 15   Temp:  97.9 F (36.6 C) 98.4 F (36.9 C) 98.2 F (36.8 C)  TempSrc:  Oral  Oral  SpO2:  99% 98%   Weight: 239 lb (108.4 kg)     Height: 6\' 1"  (1.854 m)     PainSc: 6    3     Isolation Precautions No active isolations  Medications Medications  aspirin chewable tablet 81 mg (has no administration in time range)  losartan (COZAAR) tablet 25 mg (has no administration in time range)  omega-3 acid ethyl esters (LOVAZA) capsule 2 g (has no administration in time range)  dapagliflozin propanediol (FARXIGA) tablet 10 mg (has no administration in time range)  insulin glargine-yfgn (SEMGLEE) injection 40 Units (has no administration in time range)  0.9 %  sodium chloride infusion ( Intravenous New Bag/Given 12/20/22 1906)  acetaminophen (TYLENOL) tablet 650 mg (has no administration in time range)    Or  acetaminophen (TYLENOL) suppository 650 mg (has no administration in time range)  traZODone (DESYREL) tablet 25 mg (has no administration in time range)  magnesium hydroxide (MILK OF MAGNESIA) suspension 30 mL (has no administration in time range)  ondansetron  (ZOFRAN) tablet 4 mg (has no administration in time range)    Or  ondansetron (ZOFRAN) injection  4 mg (has no administration in time range)  insulin aspart (novoLOG) injection 0-15 Units ( Subcutaneous Not Given 12/20/22 1906)  insulin aspart (novoLOG) injection 0-5 Units (has no administration in time range)  cefTRIAXone (ROCEPHIN) 2 g in sodium chloride 0.9 % 100 mL IVPB (2 g Intravenous New Bag/Given 12/20/22 1827)  morphine (PF) 4 MG/ML injection 4 mg (4 mg Intravenous Given 12/20/22 1841)    Mobility walks     Focused Assessments Patient is having hematuria. Hx of stent placement and kidney stones. Patient is A&Ox4 and able to walk. Patient did have a low blood sugar, 8 oz of orange juice was given by previous nurse at end of shift 20 minutes ago.    R Recommendations: See Admitting Provider Note  Report given to: Eilleen Kempf, RN [phone]   Additional Notes:

## 2022-12-20 NOTE — ED Triage Notes (Addendum)
Pt presenting today after noticing blood in his urine this morning. Pt has a hx of kidney stone, and stent placement. Pt endorses intermittent pain in right flank area.  Pt takes daily baby aspirin.

## 2022-12-20 NOTE — Assessment & Plan Note (Signed)
-   The patient is status post left ureteral stent placement. - Urology consult will be obtained as mentioned above.

## 2022-12-20 NOTE — Assessment & Plan Note (Addendum)
-   The patient be admitted to a medical observation bed. - We will continue her back therapy with IV Rocephin. - We will follow urine culture and sensitivity. - Urology consult to be obtained. - Dr. Alvester Morin was notified about the patient and is aware. - Will hold off aspirin due to his hematuria.

## 2022-12-20 NOTE — Assessment & Plan Note (Addendum)
-   The patient will be on supplement coverage with NovoLog. - We will continue basal coverage. - Will continue [place].

## 2022-12-21 DIAGNOSIS — N390 Urinary tract infection, site not specified: Secondary | ICD-10-CM | POA: Diagnosis not present

## 2022-12-21 DIAGNOSIS — Z8673 Personal history of transient ischemic attack (TIA), and cerebral infarction without residual deficits: Secondary | ICD-10-CM

## 2022-12-21 LAB — CBC
HCT: 35.2 % — ABNORMAL LOW (ref 39.0–52.0)
Hemoglobin: 11.6 g/dL — ABNORMAL LOW (ref 13.0–17.0)
MCH: 32.1 pg (ref 26.0–34.0)
MCHC: 33 g/dL (ref 30.0–36.0)
MCV: 97.5 fL (ref 80.0–100.0)
Platelets: 251 10*3/uL (ref 150–400)
RBC: 3.61 MIL/uL — ABNORMAL LOW (ref 4.22–5.81)
RDW: 12.9 % (ref 11.5–15.5)
WBC: 4.8 10*3/uL (ref 4.0–10.5)
nRBC: 0 % (ref 0.0–0.2)

## 2022-12-21 LAB — BASIC METABOLIC PANEL
Anion gap: 11 (ref 5–15)
BUN: 15 mg/dL (ref 8–23)
CO2: 24 mmol/L (ref 22–32)
Calcium: 9.2 mg/dL (ref 8.9–10.3)
Chloride: 103 mmol/L (ref 98–111)
Creatinine, Ser: 0.94 mg/dL (ref 0.61–1.24)
GFR, Estimated: >60 mL/min (ref >60)
Glucose, Bld: 68 mg/dL — ABNORMAL LOW (ref 70–99)
Potassium: 3.8 mmol/L (ref 3.5–5.1)
Sodium: 138 mmol/L (ref 135–145)

## 2022-12-21 LAB — GLUCOSE, CAPILLARY
Glucose-Capillary: 56 mg/dL — ABNORMAL LOW (ref 70–99)
Glucose-Capillary: 75 mg/dL (ref 70–99)
Glucose-Capillary: 53 mg/dL — ABNORMAL LOW (ref 70–99)
Glucose-Capillary: 126 mg/dL — ABNORMAL HIGH (ref 70–99)
Glucose-Capillary: 71 mg/dL (ref 70–99)

## 2022-12-21 MED ORDER — DEXTROSE-SODIUM CHLORIDE 5-0.45 % IV SOLN: INTRAVENOUS | Status: DC

## 2022-12-21 MED ORDER — DEXTROSE 50 % IV SOLN
25.0000 g | INTRAVENOUS | Status: AC
  Administered 2022-12-21: 25 g via INTRAVENOUS
  Filled 2022-12-21: qty 50

## 2022-12-21 MED ORDER — DEXTROSE 50 % IV SOLN
12.5000 g | INTRAVENOUS | Status: AC
  Administered 2022-12-21: 12.5 g via INTRAVENOUS
  Filled 2022-12-21: qty 50

## 2022-12-21 NOTE — Progress Notes (Signed)
Hypoglycemic Event  CBG: 53  Treatment: D50 50 mL (25 gm)  Symptoms: None  Follow-up CBG: Time:1240 CBG Result:126  Possible Reasons for Event: Other: NPO  Comments/MD notified: Dr. Ashok Pall notified    Jon Gills D Morgan-Johnson

## 2022-12-21 NOTE — Discharge Summary (Signed)
CAGE HOLLEMAN UEA:[id number] DOB: 03-Jul-1960 DOA: 12/20/2022  PCP: [name], NP  Admit date: 12/20/2022 Discharge date: 12/21/2022  Time spent: 35 minutes  Recommendations for Outpatient Follow-up:  Urology f/u for stent removal     Discharge Diagnoses:  Principal Problem:   Recurrent UTI Active Problems:   Hydronephrosis, left   Type 2 diabetes mellitus without complications (HCC)   BPH (benign prostatic hyperplasia)   Self-catheterizes urinary bladder   Malignant neoplasm of prostate (HCC)   CAD (coronary artery disease), native coronary artery   Dyslipidemia   Essential hypertension   Hematuria   History of CVA (cerebrovascular accident)   Discharge Condition: stable  Diet recommendation: heart healthy carb modified  Filed Weights   12/20/22 1239  Weight: 108.4 kg    History of present illness:  From admission h and p Shane Bond is a 62 y.o. male with medical history significant for hypertension, type diabetes mellitus, dyslipidemia, anemia, prostate cancer, neurogenic bladder requiring self-catheterization, urolithiasis with history of recent left sided nephrolithiasiss/p cysto- ureteroscopy with stent placement, who presented to the emergency room with acute onset of hematuria with left flank pain, that have been intermittent lately.  He denied any nausea or vomiting or diarrhea.  No fever but he has been having chills.  No chest pain or palpitations.  No cough or wheezing or dyspnea.  He denied any dysuria or urinary frequency or urgency.   Hospital Course:  Patient with history obstructive nephrolithiasis s/p left ureteral stent placed end of September, also had staph epidermidis bacteremia that hospitalization that was treated, readmitted 2 weeks ago with complicated UTI s/p treatment, now presenting with one day gross hematuria and ongoing intermittent flank pain. CT shows no signs infection or obstructing stone, patent stent. Flank pain is mild and  intermittent and stable since placement of stent, likely 2/2 that stent. Gross hematuria has essentially resolved. No s/s uti.  Urology saw here, they don't take the patient's insurance so declined to remove stent here. Patient has established with [facility] and saw them last week, he can f/u with them for stent removal (though he says he doesn't want to). Patient also has a urologist he sees in [place], advised patient he can f/u with that urologist for removal. Finally, Dr. Marlou Porch of urology says he will be referring the patient to a [facility] urologist at [facility] or [facility].   Procedures: none   Consultations: urology  Discharge Exam: Vitals:   12/21/22 0852 12/21/22 1508  BP: 108/65 115/71  Pulse: 62 66  Resp: 16 17  Temp: 97.8 F (36.6 C) 97.9 F (36.6 C)  SpO2: 100% 97%    General: NAD Cardiovascular: RRR Respiratory: CTAB No flank pain  Discharge Instructions   Discharge Instructions     Diet - low sodium heart healthy   Complete by: As directed    Increase activity slowly   Complete by: As directed       Allergies as of 12/21/2022       Reactions   Lipitor [atorvastatin] Rash   Lisinopril Rash        Medication List     TAKE these medications    Accu-Chek Guide test strip Generic drug: glucose blood Use as instructed to check blood glucose by fingerstick twice per day.   Accu-Chek Guide w/Device Kit Use as instructed to check blood glucose level by fingerstick twice per day   Accu-Chek Softclix Lancets lancets Use as instructed to check  blood glucose level by fingerstick twice per day.   Aspirin 81 MG Caps Take 81 mg by mouth daily.   cefdinir 300 MG capsule Commonly known as: OMNICEF Take 1 capsule (300 mg total) by mouth 2 (two) times a day for 10 days.   ezetimibe 10 MG tablet Commonly known as: Zetia Take 1 tablet (10 mg total) by mouth daily.   Farxiga 10 MG Tabs tablet Generic drug: dapagliflozin  propanediol Take 10 mg by mouth daily.   Lantus SoloStar 100 UNIT/ML Solostar Pen Generic drug: insulin glargine Inject 40 Units into the skin 2 (two) times daily. What changed:  when to take this additional instructions   losartan 25 MG tablet Commonly known as: COZAAR Take 1 tablet (25 mg total) by mouth daily.   Misc. Devices Misc Please provide patient with standard sized 27F urinary catheters for self cathing along with lubricant.   Carex Coccyx Cushion Misc Use daily when sitting for pressure relief ICD10 L98.429   omega-3 acid ethyl esters 1 g capsule Commonly known as: LOVAZA Take 2 capsules (2 g total) by mouth 2 (two) times daily. Please fill as a 90 day supply   Ozempic (0.25 or 0.5 MG/DOSE) 2 MG/3ML Sopn Generic drug: Semaglutide(0.25 or 0.5MG /DOS) Inject 0.5 mg into the skin once a week.   TRUEplus 5-Bevel Pen Needles 32G X 4 MM Misc Generic drug: Insulin Pen Needle Inject as directed 2 (two) times daily.       Allergies  Allergen Reactions   Lipitor [Atorvastatin] Rash   Lisinopril Rash    Follow-up Information     Earley Favor, PA-C Follow up.   Specialty: Urology Contact information: [address] [place] [address] [phone]         [name], NP Follow up.   Specialty: Nurse Practitioner Contact information: [address] [place] [address] [phone]                  The results of significant diagnostics from this hospitalization (including imaging, microbiology, ancillary and laboratory) are listed below for reference.    Significant Diagnostic Studies: CT Renal Stone Study  Result Date: 12/20/2022 CLINICAL DATA:  Right flank pain. Hematuria. Nephrolithiasis. Prostate carcinoma. * Tracking Code: BO * EXAM: CT ABDOMEN AND PELVIS WITHOUT CONTRAST TECHNIQUE: Multidetector CT imaging of the abdomen and pelvis was performed following the standard protocol without IV contrast.  RADIATION DOSE REDUCTION: This exam was performed according to the departmental dose-optimization program which includes automated exposure control, adjustment of the mA and/or kV according to patient size and/or use of iterative reconstruction technique. COMPARISON:  11/15/2022 FINDINGS: Lower chest: No acute findings. Hepatobiliary: No mass visualized on this unenhanced exam. Gallbladder is unremarkable. No evidence of biliary ductal dilatation. Pancreas: No mass or inflammatory process visualized on this unenhanced exam. Spleen:  Within normal limits in size. Adrenals/Urinary tract: There has been placement of a double pigtail left ureteral stent since prior study. Previously seen left ureteral calculus is no longer visualized, however, moderate left renal hydronephrosis shows no significant change. Left renal calculi are again seen, largest measuring 14 mm. 1-2 mm calculus noted in lower pole of right kidney, however, there is no evidence of right-sided hydronephrosis or ureteral calculi. Stomach/Bowel: No evidence of obstruction, inflammatory process, or abnormal fluid collections. A moderate paraumbilical ventral hernia is again seen which contains multiple small bowel loops. No No evidence of bowel obstruction or strangulation. Vascular/Lymphatic: No pathologically enlarged lymph nodes identified. No  evidence of abdominal aortic aneurysm. Reproductive:  No mass or other significant abnormality. Other:  None. Musculoskeletal:  No suspicious bone lesions identified. IMPRESSION: Interval placement of left ureteral stent. Previously seen left ureteral calculus is no longer visualized, however, moderate left renal hydronephrosis shows no significant change. Bilateral nephrolithiasis. No evidence of right-sided ureteral calculi or hydronephrosis. Moderate paraumbilical ventral hernia containing multiple small bowel loops. No evidence of bowel obstruction or strangulation. Electronically Signed   By: Danae Orleans  M.D.   On: 12/20/2022 17:01   DG Abdomen 1 View  Result Date: 12/05/2022 CLINICAL DATA:  Stent placement, possible infection. EXAM: ABDOMEN - 1 VIEW COMPARISON:  Retrograde pyelography 11/16/2022, CT abdomen pelvis 11/15/2022. FINDINGS: Diffuse gaseous distention of small bowel and colon with a fair amount of stool in the colon. Left double-J ureteral stent is seen with the proximal loop formed in the expected location of the lower pole moiety left renal pelvis and distal loop formed in the bladder. Left renal stones measure up to IMPRESSION: 1. Left renal stones. Double-J left ureteral stent located within the lower pole moiety duplicated ureter. 2. Diffuse gaseous distention of small bowel and colon, indicative of an ileus. Electronically Signed   By: Leanna Battles M.D.   On: 12/05/2022 15:36    Microbiology: No results found for this or any previous visit (from the past 240 hour(s)).   Labs: Basic Metabolic Panel: Recent Labs  Lab 12/20/22 1250 12/21/22 0624  NA 136 138  K 4.5 3.8  CL 100 103  CO2 24 24  GLUCOSE 134* 68*  BUN 15 15  CREATININE 1.00 0.94  CALCIUM 9.8 9.2   Liver Function Tests: No results for input(s): "AST", "ALT", "ALKPHOS", "BILITOT", "PROT", "ALBUMIN" in the last 168 hours. No results for input(s): "LIPASE", "AMYLASE" in the last 168 hours. No results for input(s): "AMMONIA" in the last 168 hours. CBC: Recent Labs  Lab 12/20/22 1250 12/21/22 0624  WBC 6.9 4.8  HGB 12.7* 11.6*  HCT 38.6* 35.2*  MCV 96.7 97.5  PLT 300 251   Cardiac Enzymes: No results for input(s): "CKTOTAL", "CKMB", "CKMBINDEX", "TROPONINI" in the last 168 hours. BNP: BNP (last 3 results) No results for input(s): "BNP" in the last 8760 hours.  ProBNP (last 3 results) No results for input(s): "PROBNP" in the last 8760 hours.  CBG: Recent Labs  Lab 12/20/22 2109 12/21/22 0731 12/21/22 0805 12/21/22 1141 12/21/22 1239  GLUCAP 169* 56* 75 53* 126*        Signed:  Silvano Bilis MD.  [facility] 12/21/2022, 3:32 PM

## 2022-12-21 NOTE — Progress Notes (Signed)
Hypoglycemic Event  CBG: 56  Treatment: D50 25 mL (12.5 gm)  Symptoms: None  Follow-up CBG: Time:0806 CBG Result:75  Possible Reasons for Event: Inadequate meal intake and Other: NPO  Comments/MD notified: Dr. Ashok Pall notified.    Iline Oven Morgan-Johnson

## 2022-12-21 NOTE — Consult Note (Signed)
The patient will need to f/u with urology in [facility] or [facility].  I will make a referral for him.  Unfortunately we don't take his insurance at [facility].  Thanks, bh

## 2022-12-21 NOTE — Consult Note (Signed)
Urology Consult Note   Requesting Attending Physician:  Kathrynn Running, MD Service Providing Consult: Urology  Consulting Attending: Dr. Alvester Bond   Reason for Consult:  hematuria, retained ureteral stent  HPI: Shane Bond is seen in consultation for reasons noted above at the request of Bond, Shane Curtis, MD. patient presents to [facility] emergency department with complaint of hematuria.  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  He most recently underwent left ureteral stent placement with Shane Bond on 11/16/2022.  Due to insurance conflicts, he establish care with [facility] and per his report, Shane Bond in [facility] is handling his androgen deprivation therapy.   In seeing him the following morning.  He is alert, oriented, in no distress.  Hematuria is reported to be improving.  CT imaging shows probable passage of the original 5 mm stone necessitating ureteral stent.  His stent appears to have migrated somewhat distally but is still within functional range.  Hydronephrosis is still present.   ------------------  Assessment:  62 y.o. 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  I do not believe this is actionable at this time.  # Retained ureteral stent Have discussed his case with Shane Bond.  If there is OR availability he will consider ureteroscopy and stent placement while patient is in hospital.  Otherwise, he will need to follow-up with [facility]. He is stable from Bond urologic  perspective and okay to discharge home if no surgical indication is found during this hospitalization.  Case and plan discussed with Shane Bond, patient and Dr. Ashok Bond.  Past Medical History: 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   Anemia    Bladder atonia    BPH (benign prostatic hyperplasia)    CAD, NATIVE VESSEL 10/29/2009   Diabetes mellitus type 2, uncontrolled, with complications    Hyperlipidemia LDL goal <70    Hypertension    Kidney stone    Neurogenic bladder as late effect of cerebrovascular accident (CVA)    OBESITY 10/22/2009   Prostate cancer (HCC)     Past Surgical History:  Past Surgical History:  Procedure Laterality Date   BACK SURGERY     CORONARY STENT PLACEMENT     CYSTOSCOPY W/ URETERAL STENT PLACEMENT  02/26/2012   Procedure: CYSTOSCOPY WITH RETROGRADE PYELOGRAM/URETERAL STENT PLACEMENT;  Surgeon: Shane Fuller, MD;  Location: [facility];  Service: Urology;  Laterality: Left;   CYSTOSCOPY W/ URETERAL STENT PLACEMENT Left 11/16/2022   Procedure: 1. Cystoscopy 2. left ureteral stent placement 3. left retrograde pyelography with interpretation;  Surgeon: Shane Fat, MD;  Location: [facility] OR;  Service: Urology;  Laterality: Left;   CYSTOSCOPY WITH RETROGRADE PYELOGRAM, URETEROSCOPY AND STENT PLACEMENT Left 08/07/2019   Procedure: CYSTOSCOPY WITH LEFT RETROGRADE PYELOGRAM, URETEROSCOPY HOLMIUM LASER AND STENT PLACEMENT;  Surgeon: Shane Fat, MD;  Location: [facility];  Service: Urology;  Laterality: Left;   CYSTOSCOPY WITH STENT PLACEMENT Left 06/24/2019   Procedure: CYSTOSCOPY WITH STENT PLACEMENT LEFT URETER WITH LEFT RETROGRADE URETERAL;  Surgeon: Winter,  Dorian Furnace, MD;  Location: [facility];  Service: Urology;  Laterality: Left;   testicles Right     Medication: Current Facility-Administered Medications  Medication Dose Route Frequency Provider Last Rate Last Admin   0.9 %  sodium chloride infusion   Intravenous Continuous  Shane Bond, Shane A, MD 100 mL/hr at 12/21/22 0600 Infusion Verify at 12/21/22 0600   acetaminophen (TYLENOL) tablet 650 mg  650 mg Oral Q6H PRN Shane Bond, Shane A, MD   650 mg at 12/20/22 2119   Or   acetaminophen (TYLENOL) suppository 650 mg  650 mg Rectal Q6H PRN Shane Bond, Shane A, MD       cefTRIAXone (ROCEPHIN) 2 g in sodium chloride 0.9 % 100 mL IVPB  2 g Intravenous Daily Shane Bond, Shane A, MD       dapagliflozin propanediol (FARXIGA) tablet 10 mg  10 mg Oral Daily Shane Bond, Shane A, MD       losartan (COZAAR) tablet 25 mg  25 mg Oral Daily Shane Bond, Shane A, MD       magnesium hydroxide (MILK OF MAGNESIA) suspension 30 mL  30 mL Oral Daily PRN Shane Bond, Shane A, MD       omega-3 acid ethyl esters (LOVAZA) capsule 2 g  2 capsule Oral BID Shane Bond, Shane A, MD   2 g at 12/20/22 2118   ondansetron (ZOFRAN) tablet 4 mg  4 mg Oral Q6H PRN Shane Bond, Shane A, MD       Or   ondansetron [facility]) injection 4 mg  4 mg Intravenous Q6H PRN Shane Bond, Shane A, MD       pneumococcal 20-valent conjugate vaccine (PREVNAR 20) injection 0.5 mL  0.5 mL Intramuscular Tomorrow-1000 Luiz Iron, NP       traZODone (DESYREL) tablet 25 mg  25 mg Oral QHS PRN Shane Bond, Vernetta Honey, MD        Allergies: Allergies  Allergen Reactions   Lipitor [Atorvastatin] Rash   Lisinopril Rash    Social History: Social History   Tobacco Use   Smoking status: Every Day    Current packs/day: 0.50    Average packs/day: 0.5 packs/day for 40.0 years (20.0 ttl pk-yrs)    Types: E-cigarettes, Cigarettes   Smokeless tobacco: Former  Building services engineer status: Former  Substance Use Topics   Alcohol use: Not Currently    Comment: occ   Drug use: No    Family History Family History  Problem Relation Age of Onset   Hypertension Mother    Diabetes Father    Stomach cancer Maternal Uncle    Diabetes Paternal Aunt    Coronary artery disease Other    Heart attack Other    Breast cancer Neg Hx    Colon cancer Neg Hx    Pancreatic cancer Neg Hx    Prostate cancer Neg Hx     Esophageal cancer Neg Hx    Liver disease Neg Hx     Review of Systems  Genitourinary:  Positive for hematuria.     Objective   Vital signs in last 24 hours: BP 108/65 (BP Location: Right Arm)   Pulse 62   Temp 97.8 F (36.6 C) (Oral)   Resp 16   Ht 6\' 1"  (1.854 m)   Wt 108.4 kg   SpO2 100%   BMI 31.53 kg/m   Physical Exam: General: NAD, Bond&O, resting, appropriate HEENT: [place]/AT Pulmonary: Normal work of breathing Cardiovascular: no cyanosis   Most Recent Labs: Lab Results  Component Value Date  WBC 4.8 12/21/2022   HGB 11.6 (L) 12/21/2022   HCT 35.2 (L) 12/21/2022   PLT 251 12/21/2022    Lab Results  Component Value Date   NA 138 12/21/2022   K 3.8 12/21/2022   CL 103 12/21/2022   CO2 24 12/21/2022   BUN 15 12/21/2022   CREATININE 0.94 12/21/2022   CALCIUM 9.2 12/21/2022   MG 1.9 11/18/2022   PHOS 2.5 11/17/2022    Lab Results  Component Value Date   INR 1.1 11/16/2022   APTT <20 (L) 11/16/2022     Urine Culture: @[id number] (laburin,org,[id number],[id number])@   IMAGING: CT Renal Stone Study  Result Date: 12/20/2022 CLINICAL DATA:  Right flank pain. Hematuria. Nephrolithiasis. Prostate carcinoma. * Tracking Code: BO * EXAM: CT ABDOMEN AND PELVIS WITHOUT CONTRAST TECHNIQUE: Multidetector CT imaging of the abdomen and pelvis was performed following the standard protocol without IV contrast. RADIATION DOSE REDUCTION: This exam was performed according to the departmental dose-optimization program which includes automated exposure control, adjustment of the mA and/or kV according to patient size and/or use of iterative reconstruction technique. COMPARISON:  11/15/2022 FINDINGS: Lower chest: No acute findings. Hepatobiliary: No mass visualized on this unenhanced exam. Gallbladder is unremarkable. No evidence of biliary ductal dilatation. Pancreas: No mass or inflammatory process visualized on this unenhanced exam. Spleen:  Within normal limits in size.  Adrenals/Urinary tract: There has been placement of Bond double pigtail left ureteral stent since prior study. Previously seen left ureteral calculus is no longer visualized, however, moderate left renal hydronephrosis shows no significant change. Left renal calculi are again seen, largest measuring 14 mm. 1-2 mm calculus noted in lower pole of right kidney, however, there is no evidence of right-sided hydronephrosis or ureteral calculi. Stomach/Bowel: No evidence of obstruction, inflammatory process, or abnormal fluid collections. Bond moderate paraumbilical ventral hernia is again seen which contains multiple small bowel loops. No No evidence of bowel obstruction or strangulation. Vascular/Lymphatic: No pathologically enlarged lymph nodes identified. No evidence of abdominal aortic aneurysm. Reproductive:  No mass or other significant abnormality. Other:  None. Musculoskeletal:  No suspicious bone lesions identified. IMPRESSION: Interval placement of left ureteral stent. Previously seen left ureteral calculus is no longer visualized, however, moderate left renal hydronephrosis shows no significant change. Bilateral nephrolithiasis. No evidence of right-sided ureteral calculi or hydronephrosis. Moderate paraumbilical ventral hernia containing multiple small bowel loops. No evidence of bowel obstruction or strangulation. Electronically Signed   By: Danae Orleans M.D.   On: 12/20/2022 17:01    ------  Elmon Kirschner, NP Pager: [phone]   Please contact the urology consult pager with any further questions/concerns.

## 2022-12-22 ENCOUNTER — Telehealth: Payer: Self-pay

## 2022-12-22 LAB — URINE CULTURE: Culture: >=100000 — AB

## 2022-12-22 NOTE — Transitions of Care (Post Inpatient/ED Visit) (Signed)
12/22/2022  Name: Shane Bond MRN: [id number] DOB: 1960-12-19  Today's TOC FU Call Status: Today's TOC FU Call Status:: Successful TOC FU Call Completed TOC FU Call Complete Date: 12/22/22 Patient's Name and Date of Birth confirmed.  Transition Care Management Follow-up Telephone Call Date of Discharge: 12/21/22 Discharge Facility: [facility]) Type of Discharge: Inpatient Admission Primary Inpatient Discharge Diagnosis:: recurrent UTI How have you been since you were released from the hospital?: Better Any questions or concerns?: No  Items Reviewed: Did you receive and understand the discharge instructions provided?: Yes Medications obtained,verified, and reconciled?: Partial Review Completed Reason for Partial Mediation Review: He said he has all medications as well as a working glucometer  and he did not have any questions about the med regime and did not need to review the med list. Any new allergies since your discharge?: No Dietary orders reviewed?: Yes Type of Diet Ordered:: heart healthy, low sodium, diabetic Do you have support at home?: Yes People in Home: friend(s), sibling(s) Name of Support/Comfort Primary Source: he said he has support from his sister as well as friends  Medications Reviewed Today: Medications Reviewed Today   Medications were not reviewed in this encounter     Home Care and Equipment/Supplies: Were Home Health Services Ordered?: No Any new equipment or medical supplies ordered?: No  Functional Questionnaire: Do you need assistance with bathing/showering or dressing?: No Do you need assistance with meal preparation?: No Do you need assistance with eating?: No Do you have difficulty maintaining continence: No (He is independent with self catheterizations and has been doing so since 1999.  He said his sister is in the process of ordering more supplies.) Do you need assistance with getting out of bed/getting out of a chair/moving?:  No Do you have difficulty managing or taking your medications?: No  Follow up appointments reviewed: PCP Follow-up appointment confirmed?: Yes Date of PCP follow-up appointment?: 03/03/23 Follow-up Provider: Bertram Denver, NP Specialist Hospital Follow-up appointment confirmed?: No Reason Specialist Follow-Up Not Confirmed: Patient has Specialist Provider Number and will Call for Appointment (He said he has called the urologist in [facility] that he has seen in the past.  He is just waiting for a call back.) Do you need transportation to your follow-up appointment?: Yes (He said he had been using transportation from his insurance company but he lost the phone that had that number. I provided him with the phone number for [facility]: [phone].) Transportation Need Intervention Addressed By:: Other: (I gave him the phone number for [facility]) Do you understand care options if your condition(s) worsen?: Yes-patient verbalized understanding    SIGNATURE. Robyne Peers, RN

## 2022-12-29 ENCOUNTER — Other Ambulatory Visit: Payer: Self-pay

## 2022-12-29 ENCOUNTER — Other Ambulatory Visit: Payer: Self-pay | Admitting: Nurse Practitioner

## 2022-12-29 DIAGNOSIS — E1165 Type 2 diabetes mellitus with hyperglycemia: Secondary | ICD-10-CM

## 2022-12-30 ENCOUNTER — Other Ambulatory Visit: Payer: Self-pay

## 2022-12-30 MED ORDER — ACCU-CHEK GUIDE VI STRP
ORAL_STRIP | 0 refills | Status: DC
  Filled 2022-12-30: qty 150, 75d supply, fill #0

## 2022-12-30 NOTE — Telephone Encounter (Signed)
Requested Prescriptions  Pending Prescriptions Disp Refills   glucose blood (ACCU-CHEK GUIDE) test strip 200 each 0    Sig: Use as instructed to check blood glucose by fingerstick twice per day.     Endocrinology: Diabetes - Testing Supplies Passed - 12/29/2022  1:57 PM      Passed - Valid encounter within last 12 months    Recent Outpatient Visits           1 month ago Urinary tract infection without hematuria, site unspecified   [facility] Of [place]. [facility] [place], [place] W, NP   4 months ago Type 2 diabetes mellitus with hyperglycemia, with long-term current use of insulin (HCC)   [facility] [place]. [facility] [place], [place] W, NP   7 months ago Type 2 diabetes mellitus with hyperglycemia, with long-term current use of insulin [facility])   [facility] Of [place]. [facility] [place], [place] W, NP   8 months ago Type 2 diabetes mellitus with hyperglycemia, with long-term current use of insulin (HCC)   [facility] - [facility] Dept Of [place]. [facility] Lois Huxley, [place] L, RPH-CPP   9 months ago Type 2 diabetes mellitus with hyperglycemia, with long-term current use of insulin (HCC)   [facility] - [facility] Dept Of [place]. [facility] Drucilla Chalet, RPH-CPP       Future Appointments             In 2 months [name], NP [facility] - [facility] Dept Of [place]. [facility]

## 2023-01-09 ENCOUNTER — Other Ambulatory Visit: Payer: Self-pay

## 2023-01-14 ENCOUNTER — Emergency Department: Payer: Medicaid Other

## 2023-01-14 ENCOUNTER — Other Ambulatory Visit: Payer: Self-pay

## 2023-01-14 ENCOUNTER — Emergency Department
Admission: EM | Admit: 2023-01-14 | Discharge: 2023-01-15 | Disposition: A | Discharge status: Home / Self Care | Payer: Medicaid Other | Attending: Emergency Medicine | Admitting: Emergency Medicine

## 2023-01-14 DIAGNOSIS — I10 Essential (primary) hypertension: Secondary | ICD-10-CM | POA: Insufficient documentation

## 2023-01-14 DIAGNOSIS — E162 Hypoglycemia, unspecified: Secondary | ICD-10-CM

## 2023-01-14 DIAGNOSIS — Z794 Long term (current) use of insulin: Secondary | ICD-10-CM | POA: Insufficient documentation

## 2023-01-14 DIAGNOSIS — R55 Syncope and collapse: Secondary | ICD-10-CM

## 2023-01-14 DIAGNOSIS — N23 Unspecified renal colic: Secondary | ICD-10-CM | POA: Insufficient documentation

## 2023-01-14 DIAGNOSIS — S0083XA Contusion of other part of head, initial encounter: Secondary | ICD-10-CM | POA: Diagnosis not present

## 2023-01-14 DIAGNOSIS — I2510 Atherosclerotic heart disease of native coronary artery without angina pectoris: Secondary | ICD-10-CM | POA: Diagnosis not present

## 2023-01-14 DIAGNOSIS — Z79899 Other long term (current) drug therapy: Secondary | ICD-10-CM | POA: Diagnosis not present

## 2023-01-14 DIAGNOSIS — Z7984 Long term (current) use of oral hypoglycemic drugs: Secondary | ICD-10-CM | POA: Diagnosis not present

## 2023-01-14 DIAGNOSIS — W19XXXA Unspecified fall, initial encounter: Secondary | ICD-10-CM | POA: Diagnosis not present

## 2023-01-14 DIAGNOSIS — E11649 Type 2 diabetes mellitus with hypoglycemia without coma: Secondary | ICD-10-CM | POA: Insufficient documentation

## 2023-01-14 DIAGNOSIS — S0990XA Unspecified injury of head, initial encounter: Secondary | ICD-10-CM | POA: Diagnosis present

## 2023-01-14 DIAGNOSIS — Z8546 Personal history of malignant neoplasm of prostate: Secondary | ICD-10-CM | POA: Diagnosis not present

## 2023-01-14 DIAGNOSIS — Z7982 Long term (current) use of aspirin: Secondary | ICD-10-CM | POA: Diagnosis not present

## 2023-01-14 LAB — LACTIC ACID, PLASMA: Lactic Acid, Venous: 2.1 mmol/L (ref 0.5–1.9)

## 2023-01-14 LAB — CBC
HCT: 38.2 % — ABNORMAL LOW (ref 39.0–52.0)
Hemoglobin: 12.4 g/dL — ABNORMAL LOW (ref 13.0–17.0)
MCH: 31.6 pg (ref 26.0–34.0)
MCHC: 32.5 g/dL (ref 30.0–36.0)
MCV: 97.2 fL (ref 80.0–100.0)
Platelets: 194 10*3/uL (ref 150–400)
RBC: 3.93 MIL/uL — ABNORMAL LOW (ref 4.22–5.81)
RDW: 15.2 % (ref 11.5–15.5)
WBC: 11.3 10*3/uL — ABNORMAL HIGH (ref 4.0–10.5)
nRBC: 0 % (ref 0.0–0.2)

## 2023-01-14 LAB — BASIC METABOLIC PANEL
Anion gap: 11 (ref 5–15)
BUN: 17 mg/dL (ref 8–23)
CO2: 20 mmol/L — ABNORMAL LOW (ref 22–32)
Calcium: 9 mg/dL (ref 8.9–10.3)
Chloride: 101 mmol/L (ref 98–111)
Creatinine, Ser: 0.99 mg/dL (ref 0.61–1.24)
GFR, Estimated: >60 mL/min (ref >60)
Glucose, Bld: 166 mg/dL — ABNORMAL HIGH (ref 70–99)
Potassium: 4.4 mmol/L (ref 3.5–5.1)
Sodium: 132 mmol/L — ABNORMAL LOW (ref 135–145)

## 2023-01-14 LAB — CBG MONITORING, ED: Glucose-Capillary: 173 mg/dL — ABNORMAL HIGH (ref 70–99)

## 2023-01-14 LAB — TROPONIN I (HIGH SENSITIVITY): Troponin I (High Sensitivity): 5 ng/L (ref <18)

## 2023-01-14 MED ORDER — SODIUM CHLORIDE 0.9 % IV BOLUS
1000.0000 mL | Freq: Once | INTRAVENOUS | Status: AC
  Administered 2023-01-15: 1000 mL via INTRAVENOUS

## 2023-01-14 MED ORDER — ONDANSETRON HCL 4 MG/2ML IJ SOLN
4.0000 mg | Freq: Once | INTRAMUSCULAR | Status: AC
  Administered 2023-01-15: 4 mg via INTRAVENOUS
  Filled 2023-01-14: qty 2

## 2023-01-14 MED ORDER — MORPHINE SULFATE (PF) 4 MG/ML IV SOLN
4.0000 mg | Freq: Once | INTRAVENOUS | Status: AC
  Administered 2023-01-15: 4 mg via INTRAVENOUS
  Filled 2023-01-14: qty 1

## 2023-01-14 NOTE — ED Triage Notes (Addendum)
Pt presents from home via [facility]. Per EMS report around 1830 syncopal episode due to hypoglycemia. Pt ate something and felt better.  CBG 199 Occipital Right side of head laceration. No blood thinners  HX of kidney stones pt wants to be checked.  BP 110/66, hr 95, 99%

## 2023-01-14 NOTE — ED Notes (Signed)
Patient has urine cup aware staff needs a urine

## 2023-01-14 NOTE — ED Notes (Signed)
Pt self caths at home. Will have him self cath when he is ready.

## 2023-01-14 NOTE — ED Provider Notes (Signed)
[facility] Provider Note    Event Date/Time   First MD Initiated Contact with Patient 01/14/23 2306     (approximate)   History   Loss of Consciousness   HPI {Remember to add pertinent medical, surgical, social, and/or OB history to HPI:1} Shane Bond is a 62 y.o. male  ***       Past Medical History   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   Anemia    Bladder atonia    BPH (benign prostatic hyperplasia)    CAD, NATIVE VESSEL 10/29/2009   Diabetes mellitus type 2, uncontrolled, with complications    Hyperlipidemia LDL goal <70    Hypertension    Kidney stone    Neurogenic bladder as late effect of cerebrovascular accident (CVA)    OBESITY 10/22/2009   Prostate cancer [facility])      Active Problem List   Patient Active Problem List   Diagnosis Date Noted   History of CVA (cerebrovascular accident) 12/21/2022   Recurrent UTI 12/20/2022   Hydronephrosis, left 12/20/2022   Dyslipidemia 12/20/2022   Type 2 diabetes mellitus without complications (HCC) 12/20/2022   Essential hypertension 12/20/2022   Hematuria 12/20/2022   UTI (urinary tract infection) 12/05/2022   Staphylococcus epidermidis bacteremia 11/18/2022   Ureteral calculus, left 11/16/2022   Essential hypertension, benign 07/27/2022   Insulin long-term use (HCC) 07/27/2022   Malignant neoplasm of prostate (HCC) 09/03/2019   Hydronephrosis with renal and ureteral calculus obstruction 06/24/2019   Thrombocytopenia (HCC) 06/21/2019   Normocytic anemia 06/21/2019   Bacteremia due to Klebsiella pneumoniae 06/21/2019   Lung infiltrate 06/20/2019   Sepsis due to urinary tract infection (HCC) 06/19/2019   Hyperkalemia 06/19/2019   Acute kidney injury (HCC) 06/19/2019   DM type 2 causing vascular disease (HCC) 04/13/2017   Diabetes mellitus without complication (HCC) 12/01/2016   Former smoker 11/02/2015   Diabetes type 2, uncontrolled 06/18/2015    Mixed hyperlipidemia 06/18/2015   BPH (benign prostatic hyperplasia) 06/18/2015   Self-catheterizes urinary bladder 06/18/2015   Hypertension associated with diabetes (HCC) 07/08/2010   CAD, NATIVE VESSEL 10/29/2009   CAD (coronary artery disease), native coronary artery 10/29/2009   Current smoker 10/22/2009   AMI, INFERIOR WALL 10/22/2009     Past Surgical History   Past Surgical History:  Procedure Laterality Date   BACK SURGERY     CORONARY STENT PLACEMENT     CYSTOSCOPY W/ URETERAL STENT PLACEMENT  02/26/2012   Procedure: CYSTOSCOPY WITH RETROGRADE PYELOGRAM/URETERAL STENT PLACEMENT;  Surgeon: Valetta Fuller, MD;  Location: [facility];  Service: Urology;  Laterality: Left;   CYSTOSCOPY W/ URETERAL STENT PLACEMENT Left 11/16/2022   Procedure: 1. Cystoscopy 2. left ureteral stent placement 3. left retrograde pyelography with interpretation;  Surgeon: [name], MD;  Location: [facility];  Service: Urology;  Laterality: Left;   CYSTOSCOPY WITH RETROGRADE PYELOGRAM, URETEROSCOPY AND STENT PLACEMENT Left 08/07/2019   Procedure: CYSTOSCOPY WITH LEFT RETROGRADE PYELOGRAM, URETEROSCOPY HOLMIUM LASER AND STENT PLACEMENT;  Surgeon: [name], MD;  Location: [facility];  Service: Urology;  Laterality: Left;   CYSTOSCOPY WITH STENT PLACEMENT Left 06/24/2019   Procedure: CYSTOSCOPY WITH STENT PLACEMENT LEFT URETER WITH LEFT RETROGRADE URETERAL;  Surgeon: Rene Paci, MD;  Location: [facility];  Service: Urology;  Laterality: Left;   testicles Right      Home Medications   Prior to Admission medications   Medication Sig Start Date End Date Taking? Authorizing  Provider  Accu-Chek Softclix Lancets lancets Use as instructed to check blood glucose level by fingerstick twice per day. 08/15/22   [name], NP  Aspirin 81 MG CAPS Take 81 mg by mouth daily.    [provider]  Blood Glucose Monitoring Suppl (ACCU-CHEK GUIDE) w/Device KIT Use as instructed to check blood  glucose level by fingerstick twice per day Patient not taking: Reported on 08/15/2022 04/26/21   [name], NP  cefdinir (OMNICEF) 300 MG capsule Take 1 capsule (300 mg total) by mouth 2 (two) times a day for 10 days. 12/16/22   Jamison Neighbor, MD  dapagliflozin propanediol (FARXIGA) 10 MG TABS tablet Take 10 mg by mouth daily.    [provider]  ezetimibe (ZETIA) 10 MG tablet Take 1 tablet (10 mg total) by mouth daily. Patient not taking: Reported on 12/20/2022 11/25/22   [name], NP  glucose blood (ACCU-CHEK GUIDE) test strip Use as instructed to check blood glucose by fingerstick twice per day. 12/30/22   [name], NP  insulin glargine (LANTUS SOLOSTAR) 100 UNIT/ML Solostar Pen Inject 40 Units into the skin 2 (two) times daily. Patient taking differently: Inject 40 Units into the skin See admin instructions. Inject 40 units the skin in the morning and 10-40 units in the evening 09/21/22   Hoy Register, MD  Insulin Pen Needle (TECHLITE PEN NEEDLES) 32G X 4 MM MISC Inject as directed 2 (two) times daily. 09/06/22   Hoy Register, MD  losartan (COZAAR) 25 MG tablet Take 1 tablet (25 mg total) by mouth daily. 09/06/22   Hoy Register, MD  Misc. Devices (CAREX COCCYX CUSHION) MISC Use daily when sitting for pressure relief ICD10 L98.429 07/02/20   [name], NP  Misc. Devices MISC Please provide patient with standard sized 61F urinary catheters for self cathing along with lubricant. 05/29/18   [name], NP  omega-3 acid ethyl esters (LOVAZA) 1 g capsule Take 2 capsules (2 g total) by mouth 2 (two) times daily. Please fill as a 90 day supply 08/15/22   [name], NP  Semaglutide,0.25 or 0.5MG /DOS, (OZEMPIC, 0.25 OR 0.5 MG/DOSE,) 2 MG/3ML SOPN Inject 0.5 mg into the skin once a week. 11/25/22   [name], NP  gemfibrozil (LOPID) 600 MG tablet Take 1 tablet (600 mg total) by mouth 2 (two) times daily before a meal. 07/20/19 11/04/19  [name],  NP     Allergies  Lipitor [atorvastatin] and Lisinopril   Family History   Family History  Problem Relation Age of Onset   Hypertension Mother    Diabetes Father    Stomach cancer Maternal Uncle    Diabetes Paternal Aunt    Coronary artery disease Other    Heart attack Other    Breast cancer Neg Hx    Colon cancer Neg Hx    Pancreatic cancer Neg Hx    Prostate cancer Neg Hx    Esophageal cancer Neg Hx    Liver disease Neg Hx      Physical Exam  Triage Vital Signs: ED Triage Vitals  Encounter Vitals Group     BP 01/14/23 2123 106/69     Systolic BP Percentile --      Diastolic BP Percentile --      Pulse Rate 01/14/23 2123 (!) 103     Resp 01/14/23 2123 16     Temp 01/14/23 2123 98.3 F (36.8 C)     Temp Source  01/14/23 2123 Oral     SpO2 01/14/23 2123 99 %     Weight 01/14/23 2124 245 lb (111.1 kg)     Height 01/14/23 2124 6' (1.829 m)     Head Circumference --      Peak Flow --      Pain Score 01/14/23 2123 3     Pain Loc --      Pain Education --      Exclude from Growth Chart --     Updated Vital Signs: BP 106/69 (BP Location: Left Arm)   Pulse (!) 103   Temp 98.3 F (36.8 C) (Oral)   Resp 16   Ht 6' (1.829 m)   Wt 111.1 kg   SpO2 99%   BMI 33.23 kg/m   {Only need to document appropriate and relevant physical exam:1} General: Awake, no distress. *** CV:  Good peripheral perfusion. *** Resp:  Normal effort. *** Abd:  No distention. *** Other:  ***   ED Results / Procedures / Treatments  Labs (all labs ordered are listed, but only abnormal results are displayed) Labs Reviewed  BASIC METABOLIC PANEL - Abnormal; Notable for the following components:      Result Value   Sodium 132 (*)    CO2 20 (*)    Glucose, Bld 166 (*)    All other components within normal limits  CBC - Abnormal; Notable for the following components:   WBC 11.3 (*)    RBC 3.93 (*)    Hemoglobin 12.4 (*)    HCT 38.2 (*)    All other components within normal limits   LACTIC ACID, PLASMA - Abnormal; Notable for the following components:   Lactic Acid, Venous 2.1 (*)    All other components within normal limits  CBG MONITORING, ED - Abnormal; Notable for the following components:   Glucose-Capillary 173 (*)    All other components within normal limits  URINALYSIS, ROUTINE W REFLEX MICROSCOPIC  LACTIC ACID, PLASMA     EKG  ***   RADIOLOGY *** {You MUST document your own interpretation of imaging, as well as the fact that you reviewed the radiologist's report!:1}  Official radiology report(s): CT Renal Stone Study  Result Date: 01/14/2023 CLINICAL DATA:  Abdominal and flank pain, stone suspected EXAM: CT ABDOMEN AND PELVIS WITHOUT CONTRAST TECHNIQUE: Multidetector CT imaging of the abdomen and pelvis was performed following the standard protocol without IV contrast. RADIATION DOSE REDUCTION: This exam was performed according to the departmental dose-optimization program which includes automated exposure control, adjustment of the mA and/or kV according to patient size and/or use of iterative reconstruction technique. COMPARISON:  12/20/2022 FINDINGS: Lower chest: No acute abnormality. Hepatobiliary: Unremarkable liver. Normal gallbladder. No biliary dilation. Pancreas: Unremarkable. Spleen: Unremarkable. Adrenals/Urinary Tract: Normal adrenal glands. Asymmetric left perinephric stranding. Interval removal of the left ureteral stent since 12/20/2022. Similar moderate left renal hydronephrosis. Nonobstructing calyceal stone in the left kidney measuring 13 mm. There is a new 5 mm stone in the proximal left ureter. Locules of gas are present within the left renal collecting system and calices. Low-attenuation lesions in the kidneys are statistically likely to represent cysts. No follow-up is required. No right urinary calculi or hydronephrosis. Unremarkable bladder. Stomach/Bowel: Normal caliber large and small bowel. No bowel wall thickening. Herniation of a  loop of nonobstructed small bowel into the paraumbilical hernia. No evidence of strangulation. Stomach is within normal limits. Vascular/Lymphatic: Aortic atherosclerosis. No enlarged abdominal or pelvic lymph nodes. Reproductive: Unremarkable. Other: No free  intraperitoneal fluid or air. Fat and small bowel containing para umbilical hernia Musculoskeletal: No acute fracture. IMPRESSION: 1. New 5 mm stone in the proximal left ureter with similar moderate left renal hydronephrosis. 2. Locules of gas within the left renal collecting system and calices. This may be related to recent stent removal. 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 3. Fat and small bowel containing paraumbilical hernia without evidence of obstruction or strangulation. Aortic Atherosclerosis (ICD10-I70.0). Electronically Signed   By: Minerva Fester M.D.   On: 01/14/2023 22:07   CT HEAD WO CONTRAST  Result Date: 01/14/2023 CLINICAL DATA:  Syncopal episode, fall with right head laceration. EXAM: CT HEAD WITHOUT CONTRAST CT MAXILLOFACIAL WITHOUT CONTRAST TECHNIQUE: Multidetector CT imaging of the head and maxillofacial structures were performed using the standard protocol without intravenous contrast. Multiplanar CT image reconstructions of the maxillofacial structures were also generated. RADIATION DOSE REDUCTION: This exam was performed according to the departmental dose-optimization program which includes automated exposure control, adjustment of the mA and/or kV according to patient size and/or use of iterative reconstruction technique. COMPARISON:  None Available. FINDINGS: CT HEAD FINDINGS Brain: No evidence of acute infarct, hemorrhage, mass, mass effect, or midline shift. No hydrocephalus or extra-axial fluid collection. Vascular: No hyperdense vessel. Atherosclerotic calcifications in the intracranial carotid and vertebral arteries. Skull: Negative for fracture or focal lesion. CT MAXILLOFACIAL FINDINGS Osseous: No  fracture or mandibular dislocation. No destructive process. Poor dentition, with multifocal dental caries. Orbits: No traumatic or inflammatory finding. Sinuses: Mucosal thickening in the left maxillary sinus and right frontal sinus. The mastoids are well aerated. Soft tissues: Negative. IMPRESSION: 1. No acute intracranial process. 2. No acute facial bone fracture. 3. Poor dentition, with multifocal dental caries. Electronically Signed   By: Wiliam Ke M.D.   On: 01/14/2023 22:01   CT Maxillofacial Wo Contrast  Result Date: 01/14/2023 CLINICAL DATA:  Syncopal episode, fall with right head laceration. EXAM: CT HEAD WITHOUT CONTRAST CT MAXILLOFACIAL WITHOUT CONTRAST TECHNIQUE: Multidetector CT imaging of the head and maxillofacial structures were performed using the standard protocol without intravenous contrast. Multiplanar CT image reconstructions of the maxillofacial structures were also generated. RADIATION DOSE REDUCTION: This exam was performed according to the departmental dose-optimization program which includes automated exposure control, adjustment of the mA and/or kV according to patient size and/or use of iterative reconstruction technique. COMPARISON:  None Available. FINDINGS: CT HEAD FINDINGS Brain: No evidence of acute infarct, hemorrhage, mass, mass effect, or midline shift. No hydrocephalus or extra-axial fluid collection. Vascular: No hyperdense vessel. Atherosclerotic calcifications in the intracranial carotid and vertebral arteries. Skull: Negative for fracture or focal lesion. CT MAXILLOFACIAL FINDINGS Osseous: No fracture or mandibular dislocation. No destructive process. Poor dentition, with multifocal dental caries. Orbits: No traumatic or inflammatory finding. Sinuses: Mucosal thickening in the left maxillary sinus and right frontal sinus. The mastoids are well aerated. Soft tissues: Negative. IMPRESSION: 1. No acute intracranial process. 2. No acute facial bone fracture. 3. Poor  dentition, with multifocal dental caries. Electronically Signed   By: Wiliam Ke M.D.   On: 01/14/2023 22:01     PROCEDURES:  Critical Care performed: {CriticalCareYesNo:19197::"Yes, see critical care procedure note(s)","No"}  Procedures   MEDICATIONS ORDERED IN ED: Medications - No data to display   IMPRESSION / MDM / ASSESSMENT AND PLAN / ED COURSE  I reviewed the triage vital signs and the nursing notes.  Differential diagnosis includes, but is not limited to, ***  Patient's presentation is most consistent with {EM COPA:27473}  {If the patient is on the monitor, remove the brackets and asterisks on the sentence below and remember to document it as a Procedure as well. Otherwise delete the sentence below:1} {**The patient is on the cardiac monitor to evaluate for evidence of arrhythmia and/or significant heart rate changes.**}  {Remember to include, when applicable, any/all of the following data: independent review of imaging independent review of labs (comment specifically on pertinent positives and negatives) review of specific prior hospitalizations, PCP/specialist notes, etc. discuss meds given and prescribed document any discussion with consultants (including hospitalists) any clinical decision tools you used and why (PECARN, NEXUS, etc.) did you consider admitting the patient? document social determinants of health affecting patient's care (homelessness, inability to follow up in a timely fashion, etc) document any pre-existing conditions increasing risk on current visit (e.g. diabetes and HTN increasing danger of high-risk chest pain/ACS) describes what meds you gave (especially parenteral) and why any other interventions?:1}      FINAL CLINICAL IMPRESSION(S) / ED DIAGNOSES   Final diagnoses:  None     Rx / DC Orders   ED Discharge Orders     None        Note:  This document was prepared using Dragon voice recognition  software and may include unintentional dictation errors.

## 2023-01-15 LAB — URINALYSIS, ROUTINE W REFLEX MICROSCOPIC
Bacteria, UA: NONE SEEN
Bilirubin Urine: NEGATIVE
Glucose, UA: >=500 mg/dL — AB
Ketones, ur: NEGATIVE mg/dL
Leukocytes,Ua: NEGATIVE
Nitrite: NEGATIVE
Protein, ur: NEGATIVE mg/dL
Specific Gravity, Urine: 1.024 (ref 1.005–1.030)
Squamous Epithelial / HPF: 0 /[HPF] (ref 0–5)
pH: 6 (ref 5.0–8.0)

## 2023-01-15 LAB — TROPONIN I (HIGH SENSITIVITY): Troponin I (High Sensitivity): 4 ng/L (ref <18)

## 2023-01-15 LAB — LACTIC ACID, PLASMA: Lactic Acid, Venous: 1.8 mmol/L (ref 0.5–1.9)

## 2023-01-15 MED ORDER — ONDANSETRON 4 MG PO TBDP: 4.0000 mg | ORAL_TABLET | Freq: Three times a day (TID) | ORAL | 0 refills | Status: DC | PRN

## 2023-01-15 MED ORDER — OXYCODONE-ACETAMINOPHEN 5-325 MG PO TABS: 1.0000 | ORAL_TABLET | ORAL | 0 refills | Status: DC | PRN

## 2023-01-15 NOTE — Discharge Instructions (Signed)
Drink plenty of fluids daily.  You may take medicines as needed for pain and nausea.  Return to the ER for worsening symptoms, persistent vomiting, difficulty breathing, lethargy or other concerns.

## 2023-01-15 NOTE — ED Notes (Signed)
Patient able to ambulate without assistance, denies dizziness.

## 2023-01-16 LAB — URINE CULTURE: Culture: 20000 — AB

## 2023-01-19 LAB — CULTURE, BLOOD (ROUTINE X 2)
Culture: NO GROWTH
Culture: NO GROWTH

## 2023-01-20 ENCOUNTER — Other Ambulatory Visit: Payer: Self-pay

## 2023-01-26 ENCOUNTER — Other Ambulatory Visit: Payer: Self-pay

## 2023-01-26 MED ORDER — CEFDINIR 300 MG PO CAPS
300.0000 mg | ORAL_CAPSULE | Freq: Two times a day (BID) | ORAL | 0 refills | Status: AC
  Filled 2023-01-26: qty 20, 10d supply, fill #0

## 2023-02-10 ENCOUNTER — Other Ambulatory Visit: Payer: Self-pay

## 2023-02-10 MED ORDER — SULFAMETHOXAZOLE-TRIMETHOPRIM 800-160 MG PO TABS
ORAL_TABLET | Freq: Two times a day (BID) | ORAL | 0 refills | Status: DC
  Filled 2023-02-10: qty 20, 10d supply, fill #0

## 2023-02-13 ENCOUNTER — Other Ambulatory Visit (HOSPITAL_COMMUNITY): Payer: Self-pay

## 2023-02-13 ENCOUNTER — Other Ambulatory Visit: Payer: Self-pay | Admitting: Nurse Practitioner

## 2023-02-13 ENCOUNTER — Other Ambulatory Visit: Payer: Self-pay | Admitting: Family Medicine

## 2023-02-13 ENCOUNTER — Other Ambulatory Visit: Payer: Self-pay

## 2023-02-13 DIAGNOSIS — E1165 Type 2 diabetes mellitus with hyperglycemia: Secondary | ICD-10-CM

## 2023-02-13 MED ORDER — GLUCOSE BLOOD VI STRP
ORAL_STRIP | Freq: Two times a day (BID) | 0 refills | Status: DC
  Filled 2023-02-13: qty 200, fill #0
  Filled 2023-02-23 – 2023-03-02 (×2): qty 200, 100d supply, fill #0

## 2023-02-13 MED ORDER — LANTUS SOLOSTAR 100 UNIT/ML ~~LOC~~ SOPN
40.0000 [IU] | PEN_INJECTOR | Freq: Two times a day (BID) | SUBCUTANEOUS | 0 refills | Status: DC
  Filled 2023-02-13: qty 24, 30d supply, fill #0

## 2023-02-14 ENCOUNTER — Other Ambulatory Visit: Payer: Self-pay

## 2023-02-16 ENCOUNTER — Other Ambulatory Visit: Payer: Self-pay

## 2023-02-21 ENCOUNTER — Emergency Department (HOSPITAL_COMMUNITY): Payer: Medicaid Other

## 2023-02-21 ENCOUNTER — Other Ambulatory Visit: Payer: Self-pay

## 2023-02-21 ENCOUNTER — Emergency Department (HOSPITAL_COMMUNITY)
Admission: EM | Admit: 2023-02-21 | Discharge: 2023-02-22 | Disposition: A | Discharge status: Home / Self Care | Payer: Medicaid Other | Attending: Emergency Medicine | Admitting: Emergency Medicine

## 2023-02-21 ENCOUNTER — Encounter (HOSPITAL_COMMUNITY): Payer: Self-pay

## 2023-02-21 DIAGNOSIS — Z7982 Long term (current) use of aspirin: Secondary | ICD-10-CM | POA: Diagnosis not present

## 2023-02-21 DIAGNOSIS — Z794 Long term (current) use of insulin: Secondary | ICD-10-CM | POA: Diagnosis not present

## 2023-02-21 DIAGNOSIS — I2510 Atherosclerotic heart disease of native coronary artery without angina pectoris: Secondary | ICD-10-CM | POA: Diagnosis not present

## 2023-02-21 DIAGNOSIS — N132 Hydronephrosis with renal and ureteral calculous obstruction: Secondary | ICD-10-CM | POA: Insufficient documentation

## 2023-02-21 DIAGNOSIS — Z8546 Personal history of malignant neoplasm of prostate: Secondary | ICD-10-CM | POA: Diagnosis not present

## 2023-02-21 DIAGNOSIS — E119 Type 2 diabetes mellitus without complications: Secondary | ICD-10-CM | POA: Insufficient documentation

## 2023-02-21 DIAGNOSIS — Z79899 Other long term (current) drug therapy: Secondary | ICD-10-CM | POA: Diagnosis not present

## 2023-02-21 DIAGNOSIS — F17200 Nicotine dependence, unspecified, uncomplicated: Secondary | ICD-10-CM | POA: Insufficient documentation

## 2023-02-21 DIAGNOSIS — N201 Calculus of ureter: Secondary | ICD-10-CM

## 2023-02-21 DIAGNOSIS — I10 Essential (primary) hypertension: Secondary | ICD-10-CM | POA: Diagnosis not present

## 2023-02-21 DIAGNOSIS — R109 Unspecified abdominal pain: Secondary | ICD-10-CM | POA: Diagnosis present

## 2023-02-21 LAB — COMPREHENSIVE METABOLIC PANEL
ALT: 61 U/L — ABNORMAL HIGH (ref 0–44)
AST: 60 U/L — ABNORMAL HIGH (ref 15–41)
Albumin: 3.1 g/dL — ABNORMAL LOW (ref 3.5–5.0)
Alkaline Phosphatase: 98 U/L (ref 38–126)
Anion gap: 11 (ref 5–15)
BUN: 19 mg/dL (ref 8–23)
CO2: 17 mmol/L — ABNORMAL LOW (ref 22–32)
Calcium: 9 mg/dL (ref 8.9–10.3)
Chloride: 104 mmol/L (ref 98–111)
Creatinine, Ser: 0.93 mg/dL (ref 0.61–1.24)
GFR, Estimated: >60 mL/min (ref >60)
Glucose, Bld: 128 mg/dL — ABNORMAL HIGH (ref 70–99)
Potassium: 4 mmol/L (ref 3.5–5.1)
Sodium: 132 mmol/L — ABNORMAL LOW (ref 135–145)
Total Bilirubin: 0.3 mg/dL (ref 0.0–1.2)
Total Protein: 9.4 g/dL — ABNORMAL HIGH (ref 6.5–8.1)

## 2023-02-21 LAB — CBC WITH DIFFERENTIAL/PLATELET
Abs Immature Granulocytes: 0.07 10*3/uL (ref 0.00–0.07)
Basophils Absolute: 0 10*3/uL (ref 0.0–0.1)
Basophils Relative: 0 %
Eosinophils Absolute: 0.1 10*3/uL (ref 0.0–0.5)
Eosinophils Relative: 1 %
HCT: 34.1 % — ABNORMAL LOW (ref 39.0–52.0)
Hemoglobin: 10.8 g/dL — ABNORMAL LOW (ref 13.0–17.0)
Immature Granulocytes: 1 %
Lymphocytes Relative: 12 %
Lymphs Abs: 1.1 10*3/uL (ref 0.7–4.0)
MCH: 29.9 pg (ref 26.0–34.0)
MCHC: 31.7 g/dL (ref 30.0–36.0)
MCV: 94.5 fL (ref 80.0–100.0)
Monocytes Absolute: 0.8 10*3/uL (ref 0.1–1.0)
Monocytes Relative: 9 %
Neutro Abs: 7.1 10*3/uL (ref 1.7–7.7)
Neutrophils Relative %: 77 %
Platelets: 361 10*3/uL (ref 150–400)
RBC: 3.61 MIL/uL — ABNORMAL LOW (ref 4.22–5.81)
RDW: 16.3 % — ABNORMAL HIGH (ref 11.5–15.5)
WBC: 9.2 10*3/uL (ref 4.0–10.5)
nRBC: 0 % (ref 0.0–0.2)

## 2023-02-21 NOTE — ED Triage Notes (Addendum)
Pt BIB EMS, pt reports with left flank pain since yesterday along with hematuria. Pt self caths.

## 2023-02-21 NOTE — ED Provider Triage Note (Signed)
 Emergency Medicine Provider Triage Evaluation Note  Shane Bond , a 62 y.o. male  was evaluated in triage.  Pt complains of left flank pain, hematuria, and chills since yesterday.  Patient self caths.  Denies dysuria, known fever.   Review of Systems  Positive: As above Negative: As above  Physical Exam  There were no vitals taken for this visit. Gen:   Awake, no distress   Resp:  Normal effort  MSK:   Moves extremities without difficulty  Other:    Medical Decision Making  Medically screening exam initiated at 5:06 PM.  Appropriate orders placed.  Shane Bond was informed that the remainder of the evaluation will be completed by another provider, this initial triage assessment does not replace that evaluation, and the importance of remaining in the ED until their evaluation is complete.     Shane Sayres R, PA-C 02/21/23 [phone]

## 2023-02-22 LAB — URINALYSIS, W/ REFLEX TO CULTURE (INFECTION SUSPECTED)
Bilirubin Urine: NEGATIVE
Glucose, UA: >=500 mg/dL — AB
Ketones, ur: NEGATIVE mg/dL
Nitrite: NEGATIVE
Protein, ur: 30 mg/dL — AB
RBC / HPF: >50 RBC/hpf (ref 0–5)
Specific Gravity, Urine: 1.018 (ref 1.005–1.030)
WBC, UA: >50 WBC/hpf (ref 0–5)
pH: 5 (ref 5.0–8.0)

## 2023-02-22 MED ORDER — KETOROLAC TROMETHAMINE 15 MG/ML IJ SOLN
15.0000 mg | Freq: Once | INTRAMUSCULAR | Status: AC
  Administered 2023-02-22: 15 mg via INTRAVENOUS
  Filled 2023-02-22: qty 1

## 2023-02-22 MED ORDER — KETOROLAC TROMETHAMINE 10 MG PO TABS
10.0000 mg | ORAL_TABLET | Freq: Four times a day (QID) | ORAL | 0 refills | Status: DC | PRN
  Filled 2023-02-22 – 2023-02-23 (×2): qty 20, 5d supply, fill #0

## 2023-02-22 MED ORDER — TAMSULOSIN HCL 0.4 MG PO CAPS
0.4000 mg | ORAL_CAPSULE | Freq: Once | ORAL | Status: AC
  Administered 2023-02-22: 0.4 mg via ORAL
  Filled 2023-02-22: qty 1

## 2023-02-22 MED ORDER — CEPHALEXIN 500 MG PO CAPS
500.0000 mg | ORAL_CAPSULE | Freq: Once | ORAL | Status: AC
  Administered 2023-02-22: 500 mg via ORAL
  Filled 2023-02-22: qty 1

## 2023-02-22 MED ORDER — CEPHALEXIN 500 MG PO CAPS
500.0000 mg | ORAL_CAPSULE | Freq: Three times a day (TID) | ORAL | 0 refills | Status: AC
  Filled 2023-02-22 – 2023-02-23 (×2): qty 42, 14d supply, fill #0

## 2023-02-22 MED ORDER — KETOROLAC TROMETHAMINE 15 MG/ML IJ SOLN
7.5000 mg | Freq: Once | INTRAMUSCULAR | Status: AC
  Administered 2023-02-22: 7.5 mg via INTRAVENOUS
  Filled 2023-02-22: qty 1

## 2023-02-22 MED ORDER — OXYCODONE HCL 5 MG PO TABS
2.5000 mg | ORAL_TABLET | Freq: Four times a day (QID) | ORAL | 0 refills | Status: AC | PRN
  Filled 2023-02-22 – 2023-02-23 (×2): qty 20, 5d supply, fill #0

## 2023-02-22 MED ORDER — TAMSULOSIN HCL 0.4 MG PO CAPS
0.4000 mg | ORAL_CAPSULE | Freq: Every day | ORAL | 0 refills | Status: AC
  Filled 2023-02-22 – 2023-02-23 (×2): qty 5, 5d supply, fill #0

## 2023-02-22 NOTE — Plan of Care (Signed)

## 2023-02-22 NOTE — ED Provider Notes (Signed)
 [facility] AT [facility] Provider Note  CSN: [id number] Arrival date & time: 02/21/23 1656  Chief Complaint(s) Flank Pain  HPI Shane Bond is a 63 y.o. male with a past medical history listed below including recurrent renal stones, neurogenic bladder who is required self cathing here for left-sided flank pain that began yesterday the pain similar to prior stones.  Noted hematuria.  No associated nausea or vomiting.   Flank Pain    Past Medical History 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   Anemia    Bladder atonia    BPH (benign prostatic hyperplasia)    CAD, NATIVE VESSEL 10/29/2009   Diabetes mellitus type 2, uncontrolled, with complications    Hyperlipidemia LDL goal <70    Hypertension    Kidney stone    Neurogenic bladder as late effect of cerebrovascular accident (CVA)    OBESITY 10/22/2009   Prostate cancer [facility])    Patient Active Problem List   Diagnosis Date Noted   History of CVA (cerebrovascular accident) 12/21/2022   Recurrent UTI 12/20/2022   Hydronephrosis, left 12/20/2022   Dyslipidemia 12/20/2022   Type 2 diabetes mellitus without complications (HCC) 12/20/2022   Essential hypertension 12/20/2022   Hematuria 12/20/2022   UTI (urinary tract infection) 12/05/2022   Staphylococcus epidermidis bacteremia 11/18/2022   Ureteral calculus, left 11/16/2022   Essential hypertension, benign 07/27/2022   Insulin  long-term use (HCC) 07/27/2022   Malignant neoplasm of prostate (HCC) 09/03/2019   Hydronephrosis with renal and ureteral calculus obstruction 06/24/2019   Thrombocytopenia (HCC) 06/21/2019   Normocytic anemia 06/21/2019   Bacteremia due to Klebsiella pneumoniae 06/21/2019   Lung infiltrate 06/20/2019   Sepsis due to urinary tract infection (HCC) 06/19/2019   Hyperkalemia 06/19/2019   Acute kidney injury (HCC) 06/19/2019   DM type 2 causing vascular disease (HCC) 04/13/2017   Diabetes  mellitus without complication (HCC) 12/01/2016   Former smoker 11/02/2015   Diabetes type 2, uncontrolled 06/18/2015   Mixed hyperlipidemia 06/18/2015   BPH (benign prostatic hyperplasia) 06/18/2015   Self-catheterizes urinary bladder 06/18/2015   Hypertension associated with diabetes (HCC) 07/08/2010   CAD, NATIVE VESSEL 10/29/2009   CAD (coronary artery disease), native coronary artery 10/29/2009   Current smoker 10/22/2009   AMI, INFERIOR WALL 10/22/2009   Home Medication(s) Prior to Admission medications   Medication Sig Start Date End Date Taking? Authorizing Provider  cephALEXin  (KEFLEX ) 500 MG capsule Take 1 capsule (500 mg total) by mouth 3 (three) times daily for 14 days. 02/22/23 03/08/23 Yes Cierrah Dace, Raynell Moder, MD  ketorolac  (TORADOL ) 10 MG tablet Take 1 tablet (10 mg total) by mouth every 6 (six) hours as needed. 02/22/23  Yes Marcelene Weidemann, Raynell Moder, MD  oxyCODONE  (ROXICODONE ) 5 MG immediate release tablet Take 0.5-1 tablets (2.5-5 mg total) by mouth every 6 (six) hours as needed for up to 5 days for severe pain (pain score 7-10). 02/22/23 02/27/23 Yes Taren Toops, Raynell Moder, MD  tamsulosin  (FLOMAX ) 0.4 MG CAPS capsule Take 1 capsule (0.4 mg total) by mouth daily for 5 days. 02/22/23 02/27/23 Yes Alyana Kreiter, Raynell Moder, MD  Accu-Chek Softclix Lancets lancets Use as instructed to check blood glucose level by fingerstick twice per day. 08/15/22   Fleming, Zelda W, NP  Aspirin  81 MG CAPS Take 81 mg by mouth daily.    [provider]  Blood Glucose Monitoring Suppl (ACCU-CHEK GUIDE) w/Device KIT Use as instructed to check blood glucose level by fingerstick twice per  day Patient not taking: Reported on 08/15/2022 04/26/21   Fleming, Zelda W, NP  dapagliflozin  propanediol (FARXIGA ) 10 MG TABS tablet Take 10 mg by mouth daily.    [provider]  ezetimibe  (ZETIA ) 10 MG tablet Take 1 tablet (10 mg total) by mouth daily. Patient not taking: Reported on 12/20/2022 11/25/22   Theotis Haze ORN, NP  glucose blood test strip Use to check blood sugar by fingerstick 2 (two) times daily. 02/13/23   Fleming, Zelda W, NP  insulin  glargine (LANTUS  SOLOSTAR) 100 UNIT/ML Solostar Pen Inject 40 Units into the skin 2 (two) times daily. 02/13/23   Newlin, Enobong, MD  Insulin  Pen Needle (TECHLITE PEN NEEDLES) 32G X 4 MM MISC Inject as directed 2 (two) times daily. 09/06/22   Newlin, Enobong, MD  losartan  (COZAAR ) 25 MG tablet Take 1 tablet (25 mg total) by mouth daily. 09/06/22   Newlin, Enobong, MD  Misc. Devices (CAREX COCCYX CUSHION) MISC Use daily when sitting for pressure relief ICD10 L98.429 07/02/20   Theotis Haze ORN, NP  Misc. Devices MISC Please provide patient with standard sized 1F urinary catheters for self cathing along with lubricant. 05/29/18   Fleming, Zelda W, NP  omega-3 acid ethyl esters (LOVAZA ) 1 g capsule Take 2 capsules (2 g total) by mouth 2 (two) times daily. Please fill as a 90 day supply 08/15/22   Fleming, Zelda W, NP  ondansetron  (ZOFRAN -ODT) 4 MG disintegrating tablet Take 1 tablet (4 mg total) by mouth every 8 (eight) hours as needed for nausea or vomiting. 01/15/23   Sung, Jade J, MD  Semaglutide ,0.25 or 0.5MG /DOS, (OZEMPIC , 0.25 OR 0.5 MG/DOSE,) 2 MG/3ML SOPN Inject 0.5 mg into the skin once a week. 11/25/22   Fleming, Zelda W, NP  sulfamethoxazole -trimethoprim  (BACTRIM  DS) 800-160 MG tablet Take 1 tablet by mouth every 12 (twelve) hours. 02/10/23     gemfibrozil  (LOPID ) 600 MG tablet Take 1 tablet (600 mg total) by mouth 2 (two) times daily before a meal. 07/20/19 11/04/19  Theotis Haze ORN, NP                                                                                                                                    Allergies Lipitor [atorvastatin ] and Lisinopril   Review of Systems Review of Systems  Genitourinary:  Positive for flank pain.   As noted in HPI  Physical Exam Vital Signs  I have reviewed the triage vital signs BP 100/65 (BP Location:  Right Arm)   Pulse 82   Temp 98.2 F (36.8 C) (Oral)   Resp 15   Ht 6' (1.829 m)   Wt 111.1 kg   SpO2 99%   BMI 33.22 kg/m   Physical Exam Vitals reviewed.  Constitutional:      General: He is not in acute distress.    Appearance: He is well-developed. He is not diaphoretic.  HENT:     Head: Normocephalic  and atraumatic.     Right Ear: External ear normal.     Left Ear: External ear normal.     Nose: Nose normal.     Mouth/Throat:     Mouth: Mucous membranes are moist.  Eyes:     General: No scleral icterus.    Conjunctiva/sclera: Conjunctivae normal.  Neck:     Trachea: Phonation normal.  Cardiovascular:     Rate and Rhythm: Normal rate and regular rhythm.  Pulmonary:     Effort: Pulmonary effort is normal. No respiratory distress.     Breath sounds: No stridor.  Abdominal:     General: There is no distension.     Tenderness: There is no abdominal tenderness.  Musculoskeletal:        General: Normal range of motion.     Cervical back: Normal range of motion.  Neurological:     Mental Status: He is alert and oriented to person, place, and time.  Psychiatric:        Behavior: Behavior normal.     ED Results and Treatments Labs (all labs ordered are listed, but only abnormal results are displayed) Labs Reviewed  URINALYSIS, W/ REFLEX TO CULTURE (INFECTION SUSPECTED) - Abnormal; Notable for the following components:      Result Value   APPearance CLOUDY (*)    Glucose, UA >=500 (*)    Hgb urine dipstick LARGE (*)    Protein, ur 30 (*)    Leukocytes,Ua LARGE (*)    Bacteria, UA MANY (*)    All other components within normal limits  COMPREHENSIVE METABOLIC PANEL - Abnormal; Notable for the following components:   Sodium 132 (*)    CO2 17 (*)    Glucose, Bld 128 (*)    Total Protein 9.4 (*)    Albumin  3.1 (*)    AST 60 (*)    ALT 61 (*)    All other components within normal limits  CBC WITH DIFFERENTIAL/PLATELET - Abnormal; Notable for the following  components:   RBC 3.61 (*)    Hemoglobin 10.8 (*)    HCT 34.1 (*)    RDW 16.3 (*)    All other components within normal limits  URINE CULTURE                                                                                                                         EKG  EKG Interpretation Date/Time:    Ventricular Rate:    PR Interval:    QRS Duration:    QT Interval:    QTC Calculation:   R Axis:      Text Interpretation:         Radiology CT Renal Stone Study Result Date: 02/21/2023 CLINICAL DATA:  Abdominal/flank pain, stone suspected EXAM: CT ABDOMEN AND PELVIS WITHOUT CONTRAST TECHNIQUE: Multidetector CT imaging of the abdomen and pelvis was performed following the standard protocol without IV contrast. RADIATION DOSE REDUCTION: This exam was performed according to the departmental  dose-optimization program which includes automated exposure control, adjustment of the mA and/or kV according to patient size and/or use of iterative reconstruction technique. COMPARISON:  Noncontrast CT 01/14/2023, 12/20/2022, additional priors FINDINGS: Lower chest: No acute airspace disease. No pleural effusion. Normal heart size with coronary artery calcifications. Hepatobiliary: Unremarkable unenhanced appearance of the liver. Gallbladder physiologically distended, no calcified stone. No biliary dilatation. Pancreas: No ductal dilatation or inflammation. Spleen: Normal in size without focal abnormality. Adrenals/Urinary Tract: No adrenal nodule. The previous 5 mm left ureteral stone is not definitively seen, however the 11 mm stone previously in the left lower pole calyx is now in the mid ureter. Moderate left hydronephrosis and perinephric edema is similar to prior exam. There is left ureteral thickening both above and distal to the ureteral stone with perienteric fat stranding. Punctate right lower pole calculus. No right hydronephrosis. Right renal cyst. No further follow-up imaging is recommended. Mild  bladder wall thickening. No bladder stone. No urethral stone. Stomach/Bowel: No bowel obstruction or inflammation. Midline ventral abdominal wall hernia contains short segment of small bowel. No associated wall thickening. There is significant colonic redundancy with moderate volume of stool. The appendix is not definitively seen. Vascular/Lymphatic: Aortic atherosclerosis. Prominent left periaortic node measuring 8 mm, series 3, image 44, increased from prior. Reproductive: Prostate is unremarkable. Other: No free air or ascites. Ventral abdominal wall hernia containing small bowel. Musculoskeletal: Multilevel degenerative disc disease and facet hypertrophy in the spine. There are no acute or suspicious osseous abnormalities. IMPRESSION: 1. Obstructing 11 mm stone in the left mid ureter with moderate hydronephrosis and perinephric edema. The previous 5 mm left ureteral stone is not seen. There is left ureteral thickening both above and distal to the ureteral stone with perienteric fat stranding, likely reactive. 2. Punctate right lower pole calculus. No right hydronephrosis. 3. Mild bladder wall thickening, recommend correlation with urinalysis to exclude cystitis. 4. Ventral abdominal wall hernia containing short segment of small bowel. No bowel obstruction or inflammation. 5. Prominent left periaortic node measuring 8 mm, increased from prior, likely reactive. Aortic Atherosclerosis (ICD10-I70.0). Electronically Signed   By: Andrea Gasman M.D.   On: 02/21/2023 21:46    Medications Ordered in ED Medications  ketorolac  (TORADOL ) 15 MG/ML injection 15 mg (15 mg Intravenous Given 02/22/23 0110)  ketorolac  (TORADOL ) 15 MG/ML injection 7.5 mg (7.5 mg Intravenous Given 02/22/23 0345)  tamsulosin  (FLOMAX ) capsule 0.4 mg (0.4 mg Oral Given 02/22/23 0345)  cephALEXin  (KEFLEX ) capsule 500 mg (500 mg Oral Given 02/22/23 0345)   Procedures Procedures  (including critical care time) Medical Decision Making / ED  Course   Medical Decision Making Amount and/or Complexity of Data Reviewed Labs: ordered. Decision-making details documented in ED Course. Radiology: ordered and independent interpretation performed. Decision-making details documented in ED Course.  Risk Prescription drug management. Decision regarding hospitalization.    Workup notable for 11 mm stone in the left mid ureter with moderate hydronephrosis.  No AKI on labs.  No leukocytosis.  UA with leukocytes consistent with inflammation.  He does have many bacteria but no nitrites.  Patient has had many bacteria's on numerous UAs in the past with cultures growing out multiple species.  He has had E. coli and Serratia but urine at that time had positive nitrites.  Will treat empirically and send urine for culture.  Pain was controlled with IV Toradol .    Patient is established with urology at [facility].  Given his pain control at this time, expectant management with close urology follow-up  for possible extraction.      Final Clinical Impression(s) / ED Diagnoses Final diagnoses:  Ureterolithiasis   The patient appears reasonably screened and/or stabilized for discharge and I doubt any other medical condition or other [facility] requiring further screening, evaluation, or treatment in the ED at this time. I have discussed the findings, Dx and Tx plan with the patient/family who expressed understanding and agree(s) with the plan. Discharge instructions discussed at length. The patient/family was given strict return precautions who verbalized understanding of the instructions. No further questions at time of discharge.  Disposition: Discharge  Condition: Good  ED Discharge Orders          Ordered    cephALEXin  (KEFLEX ) 500 MG capsule  3 times daily        02/22/23 0355    tamsulosin  (FLOMAX ) 0.4 MG CAPS capsule  Daily        02/22/23 0355    ketorolac  (TORADOL ) 10 MG tablet  Every 6 hours PRN        02/22/23 0355    oxyCODONE  (ROXICODONE ) 5  MG immediate release tablet  Every 6 hours PRN        02/22/23 0402             Follow Up: Puschinsky, Charlie ORN., MD [address] [place] [address] [phone]  Call  to schedule an appointment for close follow up    This chart was dictated using voice recognition software.  Despite best efforts to proofread,  errors can occur which can change the documentation meaning.    Trine Raynell Moder, MD 02/22/23 [phone]

## 2023-02-22 NOTE — ED Notes (Signed)
 [facility] contacted for transport

## 2023-02-22 NOTE — Discharge Instructions (Addendum)
 For pain control, please take your prescribed Toradol every 6 hours and 1000 mg of Tylenol every 8 hours scheduled.  In addition you can take 0.5 to 1 tablet of Oxycodone every 6 hours as needed for pain not controlled with other medications.

## 2023-02-23 ENCOUNTER — Other Ambulatory Visit: Payer: Self-pay

## 2023-02-23 LAB — URINE CULTURE

## 2023-02-24 ENCOUNTER — Other Ambulatory Visit: Payer: Self-pay

## 2023-02-27 ENCOUNTER — Ambulatory Visit: Payer: Medicaid Other | Admitting: "Endocrinology

## 2023-03-01 ENCOUNTER — Other Ambulatory Visit: Payer: Self-pay

## 2023-03-01 ENCOUNTER — Other Ambulatory Visit (HOSPITAL_COMMUNITY): Payer: Self-pay

## 2023-03-01 MED ORDER — FLUCONAZOLE 100 MG PO TABS
100.0000 mg | ORAL_TABLET | Freq: Every day | ORAL | 0 refills | Status: DC
  Filled 2023-03-01 (×2): qty 14, 14d supply, fill #0

## 2023-03-01 MED ORDER — CEFDINIR 300 MG PO CAPS
300.0000 mg | ORAL_CAPSULE | Freq: Two times a day (BID) | ORAL | 0 refills | Status: DC
  Filled 2023-03-01: qty 14, 7d supply, fill #0

## 2023-03-02 ENCOUNTER — Other Ambulatory Visit (HOSPITAL_COMMUNITY): Payer: Self-pay

## 2023-03-02 ENCOUNTER — Other Ambulatory Visit: Payer: Self-pay | Admitting: Nurse Practitioner

## 2023-03-02 ENCOUNTER — Other Ambulatory Visit: Payer: Self-pay

## 2023-03-02 DIAGNOSIS — E1165 Type 2 diabetes mellitus with hyperglycemia: Secondary | ICD-10-CM

## 2023-03-03 ENCOUNTER — Telehealth: Payer: Self-pay | Admitting: Nurse Practitioner

## 2023-03-03 NOTE — H&P (Signed)
 > [facility] [address] [place], [place] [address] Phone [phone] Fax [phone]  Patient:  Shane Bond   Date of Birth: 1960/06/19  Date:   03/03/2023  MRN: [id number]  Visit Type:  Office Visit  Document Type: Chart Note       This 63 year old male presents for Kidney stones, Ureteral calculus and KUB.  History of Present Illness 1.  Kidney stones  2.  Ureteral calculus  3.  KUB  HPI Details: Prostate cancer history.  Neuropathic bladder.  Intermittent catheterization up to 10 times a day.  Labs April 26, 2019 PSA 62.80.  Digital rectal exam prostate firm asymmetric right greater than left.  Prostate biopsy Jul 22, 2019 volume 48.3 mL length 4.2 cm.  Hypoechoic regions.  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  Lupron started February 27, 2020.  Final Lupron injection February 10, 2023.  Insulin -dependent diabetes.  Completed XRT for prostate cancer May 2022.  Labs February 10, 2023 glucose 144 creatinine 1.10 PSA undetectable.  Labs Jul 12, 2022 urine culture Staphylococcus.  See catheter supply note dated November 07, 2022.  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  Left renal 13 mm calculus.  Status post stone manipulation left stent [facility] November 15, 2022.  Left stent removal December 27, 2022.  Has incomplete left duplex system with 11 mm left proximal to mid ureteral lower pole moiety calculus proximal to where the ureters join on the left side at the level of L4.  KUB with extra film today left stent in proper position from left lower pole moiety renal pelvis to bladder.  Stone not definitely visualized.  Cystoscopy stone manipulation left stent placement accomplished February 28, 2022.  Urine culture from left lower pole renal pelvis March 01, 2023 no Uro pathogens isolated.  He is currently on Omnicef  and  Diflucan .  Lengthy discussion with patient regarding treatment options.  He is opted for cystoscopy bilateral retrograde pyelograms left ureteroscopy Moses laser stone manipulation left stent placement fluoroscopy.  Patient understands risks benefits treatment options.  Continue intermittent catheterization program.  Complete antibiotics.  Discussed dietary modifications.  Avoid constipation.  Avoid dehydration.  Time voids, double voids.  Call for any questions, concerns or problems.   Problem List Problem List reviewed.  Problem Description Onset Date Chronic Clinical Status Notes  Elevated PSA  Y      Diagnostics Status Study Ordered Completed Interpretation Result/Report  completed Abdomen X-ray; Limited (AP view only) (KUB) 03/03/2023 03/03/2023      Past Medical/Surgical History  (Reviewed, updated) Disease/disorder Onset Date Management Date Comments    History of ureteroscopy and laser lithotripsy      Cystoscopic removal of ureteric stent      Percutaneous transluminal balloon angioplasty with insertion of stent into coronary artery      Cystoscopy      Lithotripsy      Ureteroscopy      Ureteroscopy, calculi extraction      Cystoscopic insertion of ureteric stent      Cystoscopy and retrograde pyelography      Surgery, lumbar spine      History of testicular surgery      Coronary angioplasty      Prostate biopsy    Allergies      Anemia      Bilirubinuria      Cancer, prostate  Cerebrovascular accident      Coronary artery disease      Diabetes, type 2      Dysuria      Elevated PSA      Emphysema      Enlarged prostate      Glycosuria      Hematuria      High cholesterol      Hydronephrosis      Hyperlipidemia      Hypertension      Ketonuria      Leukocytes in urine      Micturition frequency and polyuria      Neurogenic bladder      Nocturia      Painful urination      Proteinuria      Pyelonephritis      Pyuria      Retention of urine       Urinary tract infection, chronic      Urine nitrite positive      Urobilinogenuria      Urolithiasis      Vitamin C deficiency        Allergies Ingredient Reaction (Severity) Medication Name Comment  ATORVASTATIN  CALCIUM  Rash Lipitor    Reviewed, no changes.   Family History Relationship Family Member Name Deceased Age at Death Condition Onset Age Cause of Death  Father    diabetes mellitus type 2  N  Mother    glaucoma  N  Mother    pulmonary emphysema  N  Mother    hypertension  N    Social History  (Reviewed, updated) Tobacco use reviewed.  Preferred language is English.    Marital Status/Family/Social Support Marital status: Single   Tobacco use status: Ex-cigarette smoker.  Smoking status: Former smoker.  Smoking Status Type Smoking Status Usage Per Day Years Used Pack Years Total Pack Years  Cigarette Former smoker       Cessation Type Date Quit Longest Tobacco Free Cessation Method Relapse Reason  Cigarette 03/24/2009      Tobacco Cessation Information Date Counseled By Order Status Description Code Tobacco Cessation Information  05/31/2019 Quintin Arts Tobacco cessation counseling completed   Smoking effects education    Vaping Use Screened for vaping? Yes Status: Current user Vaping reason: Quit smoking Vaping cessation discussed: Yes  Tobacco/Vaping Exposure There is passive vaping exposure. The patient has had exposure to second hand smoke in the home environment.    Review of Systems System Neg/Pos Details  Constitutional Negative Fatigue, Fever and Night sweats.  ENMT Negative Ear drainage, Hearing loss and Nasal drainage.  Eyes Negative Eye discharge, Vision changes and Vision loss.  Respiratory Negative Cough, Dyspnea and Wheezing.  Cardio Negative Chest pain, Claudication and Irregular heartbeat/palpitations.  GI Negative Abdominal pain, Constipation, Diarrhea and Vomiting.  GU Positive Urinary frequency, Urinary retention.  GU  Negative Dysuria, Hematuria, Polyuria (Genitourinary) and Urinary incontinence.  Endocrine Negative Cold intolerance, Heat intolerance, Polydipsia, Polyphagia and Polyuria (Endocrine).  Neuro Negative Gait disturbance.  Psych Negative Anxiety and Depression.  Integumentary Negative Pruritus and Rash.  MS Negative Joint swelling and Muscle weakness.  Hema/Lymph Negative Easy bleeding and Easy bruising.  Allergic/Immuno Negative Environmental allergies and Food allergies.  Reproductive Positive Erectile dysfunction.  Reproductive Negative Penile discharge.     Vital Signs  Height Time ft in cm Last Measured Height Position  11:09 AM 6.0 0.50 184.15 03/03/2023 0   Weight/BSA/BMI Time lb oz kg Context BMI kg/m2 BSA m2  11:09 AM 245.00  111.130  32.77    Blood Pressure Time BP mm/Hg Position Side Site Method Cuff Size  11:09 AM 123/80 sitting left arm automatic Adult   Temperature/Pulse/Respiration Time Temp F Temp C Temp Site Pulse/min Pattern Resp/ min  11:09 AM    94     Measured by Time Measured by  11:09 AM Quintin Arts    Physical Exam Exam Findings Details  Constitutional Normal Well developed.  Neck Exam Normal Inspection - Normal. Palpation - Normal. Thyroid gland - Normal.  Respiratory Normal Inspection - Normal. Auscultation - Normal.  Cardiovascular Normal Regular rate and rhythm. No murmurs, gallops, or rubs.  Vascular Normal Pulses - Dorsalis pedis: Normal.  Abdomen Normal No abdominal tenderness. No hepatic enlargement. No spleen enlargement.  Genitourinary Normal Penis - Normal. Scrotum - Normal. Testes - Normal.  Rectal * Prostate - Findings: 3+ enlarged (40-50 grams).  Skin Normal Inspection - Normal.  Extremity Normal No edema.  Psychiatric Normal Orientation - Oriented to time, place, person & situation. Appropriate mood and affect.     Completed Orders (This Visit) Order Details Reason Side Interpretation Result Additional Info Initial Treatment  Date Region  Urinalysis, non-automated, w/scope    see detail Method of collection: void. Clarity: Cloudy. Urine dipstick: Color: dark yellow. Glucose: 1000 mg/dl. Bilirubin: small. Ketones: trace. Specific Gravity: 1.010. Blood: large. pH: 6.0.  Protein: 2+. Urobilinogen: normal. Nitrite: negative. Leukocytes: moderate.     Abdomen X-ray; Limited (AP view only) (KUB)          Dietary management education, guidance, and counseling          Other Diagnostic Orders Status Order Code Timeframe Appointment Interpretation  completed Abdomen X-ray; Limited (AP view only) (KUB) N20.2       Assessment/Plan # Detail Type Description   1. Assessment Calculus of kidney with calculus of ureter (N20.2).   Plan Orders The patient had the following order(s): Abdomen X-ray; Limited (AP view only) (KUB). Obtained on 03/03/2023. The patient had the following order(s): Urinalysis, non-automated, w/scope. Obtained on 03/03/2023, Interpretation: see detail, Result details: Method of collection: void. Clarity: Cloudy. Urine dipstick: Color: dark yellow. Glucose: 1000 mg/dl. Bilirubin: small. Ketones: trace. Specific Gravity: 1.010. Blood: large. pH: 6.0.  Protein: 2+. Urobilinogen: normal. Nitrite: negative. Leukocytes: moderate.       2. Assessment Body mass index (BMI) 32.0-32.9, adult (S31.67).   Plan Orders Today's instructions / counseling include(s) Dietary management education, guidance, and counseling.       Assessment Details: Prostate cancer completed XRT May 2022.  Completed Lupron February 10, 2023.  Insulin -dependent diabetes.  Neurogenic bladder.  Intermittent catheterization program.  Left infraorbital 8 duplex system renal with lower pole moiety proximal mid ureter 11 mm calculus.  Plan Details: Plan cystoscopy bilateral retrograde pyelograms left ureteroscopy Moses laser stone manipulation left stent replacement fluoroscopy at his convenience.  Risks benefits treatment options discussed.  Complete  antibiotic course. Instruction(s)/Education: Dietary management education, guidance, and counseling    Medication Reconciliation Medications reconciled today.  Medications (Started, Stopped or Renewed this visit) Start Date Medication Directions PRN Status PRN Reason Instruction Stop Date  11/03/2022 Accu-Chek Guide test strips  N     11/15/2022 Accu-Chek Softclix Lancets  N     02/10/2023 Bactrim  DS 800 mg-160 mg tablet take 1 tablet by oral route  every 12 hours N     08/15/2022 cefdinir  300 mg capsule take 1 capsule by oral route  every 12 hours for 5 days N  09/12/2022 ciprofloxacin  500 mg tablet  N     02/10/2022 doxycycline  hyclate 100 mg tablet  N     08/19/2022 ezetimibe  10 mg tablet  N     11/25/2022 Farxiga  10 mg tablet  N      Lantus  Solostar U-100 Insulin  100 unit/mL (3 mL) subcutaneous pen inject 50 unit by subcutaneous route  every evening as per insulin  protocol N     11/19/2022 linezolid  600 mg tablet  N      losartan  25 mg tablet take 1 tablet by oral route  every day N      omega-3 acid ethyl esters 1 gram capsule take 1 capsule by oral route 2 times every day N     02/10/2022 oseltamivir  75 mg capsule  N     11/25/2022 Ozempic  0.25 mg or 0.5 mg (2 mg/3 mL) subcutaneous pen injector  N     07/27/2022 Ozempic  1 mg/dose (4 mg/3 mL) subcutaneous pen injector  N     09/12/2022 phenazopyridine  100 mg tablet  N     10/26/2022 TechLITE Pen Needle 32 gauge x 5/32  N     12/08/2022 TRUEplus Pen Needle 32 gauge x 5/32  N       Orders  Office labs Assessment Test Interpretation Value  N20.2 Urinalysis, non-automated, w/scope see detail Method of collection: void. Clarity: Cloudy. Urine dipstick: Color: dark yellow. Glucose: 1000 mg/dl. Bilirubin: small. Ketones: trace. Specific Gravity: 1.010. Blood: large. pH: 6.0.  Protein: 2+. Urobilinogen: normal. Nitrite: negative. Leukocytes: moderate.   Diagnostic Procedures Assessment Procedure  N20.2 Abdomen X-ray; Limited  (AP view only) (KUB)   Instruction(s)/Education Assessment Instruction  [id number] Dietary management education, guidance, and counseling    Counseling Details: Counseling / educational factors reviewed.    Provider GWENITH CHARLIE ORN 03/03/2023 11:28 AM  Document generated by:  Charlie Puschinsky 03/03/2023 11:28 AM   Electronically signed by CHARLIE MICAEL GWENITH MD on 03/03/2023 11:28 AM

## 2023-03-04 MED ORDER — OZEMPIC (0.25 OR 0.5 MG/DOSE) 2 MG/3ML ~~LOC~~ SOPN
0.5000 mg | PEN_INJECTOR | SUBCUTANEOUS | 1 refills | Status: DC
  Filled 2023-03-04 – 2023-03-06 (×2): qty 3, 28d supply, fill #0
  Filled 2023-04-14 (×3): qty 3, 28d supply, fill #1

## 2023-03-06 ENCOUNTER — Other Ambulatory Visit (HOSPITAL_COMMUNITY): Payer: Self-pay

## 2023-03-06 ENCOUNTER — Other Ambulatory Visit: Payer: Self-pay

## 2023-03-08 ENCOUNTER — Other Ambulatory Visit: Payer: Self-pay

## 2023-03-08 ENCOUNTER — Other Ambulatory Visit (HOSPITAL_COMMUNITY): Payer: Self-pay

## 2023-03-08 MED ORDER — CEFDINIR 300 MG PO CAPS
300.0000 mg | ORAL_CAPSULE | Freq: Two times a day (BID) | ORAL | 0 refills | Status: DC
  Filled 2023-03-08 (×2): qty 14, 7d supply, fill #0

## 2023-03-15 ENCOUNTER — Other Ambulatory Visit: Payer: Self-pay

## 2023-03-15 ENCOUNTER — Encounter (HOSPITAL_COMMUNITY): Payer: Self-pay

## 2023-03-15 ENCOUNTER — Emergency Department (HOSPITAL_COMMUNITY): Payer: Medicaid Other

## 2023-03-15 ENCOUNTER — Emergency Department (HOSPITAL_COMMUNITY)
Admission: EM | Admit: 2023-03-15 | Discharge: 2023-03-15 | Disposition: A | Discharge status: Home / Self Care | Payer: Medicaid Other | Attending: Emergency Medicine | Admitting: Emergency Medicine

## 2023-03-15 DIAGNOSIS — Z7982 Long term (current) use of aspirin: Secondary | ICD-10-CM | POA: Diagnosis not present

## 2023-03-15 DIAGNOSIS — Z794 Long term (current) use of insulin: Secondary | ICD-10-CM | POA: Diagnosis not present

## 2023-03-15 DIAGNOSIS — D72829 Elevated white blood cell count, unspecified: Secondary | ICD-10-CM | POA: Insufficient documentation

## 2023-03-15 DIAGNOSIS — K529 Noninfective gastroenteritis and colitis, unspecified: Secondary | ICD-10-CM | POA: Insufficient documentation

## 2023-03-15 DIAGNOSIS — R1084 Generalized abdominal pain: Secondary | ICD-10-CM | POA: Diagnosis present

## 2023-03-15 LAB — COMPREHENSIVE METABOLIC PANEL
ALT: 31 U/L (ref 0–44)
AST: 22 U/L (ref 15–41)
Albumin: 3.4 g/dL — ABNORMAL LOW (ref 3.5–5.0)
Alkaline Phosphatase: 71 U/L (ref 38–126)
Anion gap: 9 (ref 5–15)
BUN: 19 mg/dL (ref 8–23)
CO2: 22 mmol/L (ref 22–32)
Calcium: 8.9 mg/dL (ref 8.9–10.3)
Chloride: 100 mmol/L (ref 98–111)
Creatinine, Ser: 0.7 mg/dL (ref 0.61–1.24)
GFR, Estimated: >60 mL/min (ref >60)
Glucose, Bld: 137 mg/dL — ABNORMAL HIGH (ref 70–99)
Potassium: 4 mmol/L (ref 3.5–5.1)
Sodium: 131 mmol/L — ABNORMAL LOW (ref 135–145)
Total Bilirubin: 1 mg/dL (ref 0.0–1.2)
Total Protein: 8.1 g/dL (ref 6.5–8.1)

## 2023-03-15 LAB — CBC WITH DIFFERENTIAL/PLATELET
Abs Immature Granulocytes: 0.07 10*3/uL (ref 0.00–0.07)
Basophils Absolute: 0.1 10*3/uL (ref 0.0–0.1)
Basophils Relative: 0 %
Eosinophils Absolute: 0.1 10*3/uL (ref 0.0–0.5)
Eosinophils Relative: 1 %
HCT: 34.6 % — ABNORMAL LOW (ref 39.0–52.0)
Hemoglobin: 11.1 g/dL — ABNORMAL LOW (ref 13.0–17.0)
Immature Granulocytes: 1 %
Lymphocytes Relative: 13 %
Lymphs Abs: 1.9 10*3/uL (ref 0.7–4.0)
MCH: 31.2 pg (ref 26.0–34.0)
MCHC: 32.1 g/dL (ref 30.0–36.0)
MCV: 97.2 fL (ref 80.0–100.0)
Monocytes Absolute: 1.4 10*3/uL — ABNORMAL HIGH (ref 0.1–1.0)
Monocytes Relative: 9 %
Neutro Abs: 11.1 10*3/uL — ABNORMAL HIGH (ref 1.7–7.7)
Neutrophils Relative %: 76 %
Platelets: 311 10*3/uL (ref 150–400)
RBC: 3.56 MIL/uL — ABNORMAL LOW (ref 4.22–5.81)
RDW: 19 % — ABNORMAL HIGH (ref 11.5–15.5)
WBC: 14.6 10*3/uL — ABNORMAL HIGH (ref 4.0–10.5)
nRBC: 0 % (ref 0.0–0.2)

## 2023-03-15 LAB — LIPASE, BLOOD: Lipase: 28 U/L (ref 11–51)

## 2023-03-15 MED ORDER — AMOXICILLIN-POT CLAVULANATE 875-125 MG PO TABS
1.0000 | ORAL_TABLET | Freq: Two times a day (BID) | ORAL | 0 refills | Status: DC
  Filled 2023-03-15: qty 14, 7d supply, fill #0

## 2023-03-15 MED ORDER — AMOXICILLIN-POT CLAVULANATE 875-125 MG PO TABS
1.0000 | ORAL_TABLET | Freq: Once | ORAL | Status: AC
  Administered 2023-03-15: 1 via ORAL
  Filled 2023-03-15: qty 1

## 2023-03-15 MED ORDER — IOHEXOL 300 MG/ML  SOLN
100.0000 mL | Freq: Once | INTRAMUSCULAR | Status: AC | PRN
  Administered 2023-03-15: 100 mL via INTRAVENOUS

## 2023-03-15 MED ORDER — OXYCODONE HCL 5 MG PO TABS
5.0000 mg | ORAL_TABLET | Freq: Four times a day (QID) | ORAL | 0 refills | Status: DC | PRN
  Filled 2023-03-15: qty 10, 3d supply, fill #0

## 2023-03-15 MED ORDER — OXYCODONE HCL 5 MG PO TABS
5.0000 mg | ORAL_TABLET | Freq: Once | ORAL | Status: AC
  Administered 2023-03-15: 5 mg via ORAL
  Filled 2023-03-15: qty 1

## 2023-03-15 NOTE — ED Provider Notes (Signed)
[place] EMERGENCY DEPARTMENT AT [facility] Provider Note   CSN: [id number] Arrival date & time: 03/15/23  1101     History  Chief Complaint  Patient presents with   Abdominal Pain   Rectal Bleeding    Shane Bond is a 63 y.o. male.  HPI   63 year old male with past medical history of kidney stones status post stent placement which was removed yesterday presents the emergency department with the generalized abdominal pain and small amount of bright red blood in the toilet with bowel movements.  Patient states that he believes he ate something that could have been bad.  He had a couple frequent bowel movements and then recently had 2 bowel movements that had what he describes as bright red blood in the toilet bowl without any clots.  No other independent rectal bleeding.  Patient is still having mild discomfort with urination.  Home Medications Prior to Admission medications   Medication Sig Start Date End Date Taking? Authorizing Provider  Accu-Chek Softclix Lancets lancets Use as instructed to check blood glucose level by fingerstick twice per day. 08/15/22   [name], NP  Aspirin 81 MG CAPS Take 81 mg by mouth daily.    [provider]  Blood Glucose Monitoring Suppl (ACCU-CHEK GUIDE) w/Device KIT Use as instructed to check blood glucose level by fingerstick twice per day Patient not taking: Reported on 08/15/2022 04/26/21   [name], NP  cefdinir (OMNICEF) 300 MG capsule Take 1 capsule (300 mg total) by mouth 2 (two) times daily FOR 7 DAYS. 03/08/23     dapagliflozin propanediol (FARXIGA) 10 MG TABS tablet Take 10 mg by mouth daily.    [provider]  ezetimibe (ZETIA) 10 MG tablet Take 1 tablet (10 mg total) by mouth daily. Patient not taking: Reported on 12/20/2022 11/25/22   [name], NP  fluconazole (DIFLUCAN) 100 MG tablet Take 1 tablet (100 mg total) by mouth daily. 03/01/23     glucose blood test strip Use to check  blood sugar by fingerstick 2 (two) times daily. 02/13/23   [name], NP  insulin glargine (LANTUS SOLOSTAR) 100 UNIT/ML Solostar Pen Inject 40 Units into the skin 2 (two) times daily. 02/13/23   Hoy Register, MD  Insulin Pen Needle (TECHLITE PEN NEEDLES) 32G X 4 MM MISC Inject as directed 2 (two) times daily. 09/06/22   Hoy Register, MD  ketorolac (TORADOL) 10 MG tablet Take 1 tablet (10 mg total) by mouth every 6 (six) hours as needed. 02/22/23   Nira Conn, MD  losartan (COZAAR) 25 MG tablet Take 1 tablet (25 mg total) by mouth daily. 09/06/22   Hoy Register, MD  Misc. Devices (CAREX COCCYX CUSHION) MISC Use daily when sitting for pressure relief ICD10 L98.429 07/02/20   [name], NP  Misc. Devices MISC Please provide patient with standard sized 33F urinary catheters for self cathing along with lubricant. 05/29/18   [name], NP  omega-3 acid ethyl esters (LOVAZA) 1 g capsule Take 2 capsules (2 g total) by mouth 2 (two) times daily. Please fill as a 90 day supply 08/15/22   [name], NP  ondansetron (ZOFRAN-ODT) 4 MG disintegrating tablet Take 1 tablet (4 mg total) by mouth every 8 (eight) hours as needed for nausea or vomiting. 01/15/23   Irean Hong, MD  Semaglutide,0.25 or 0.5MG /DOS, (OZEMPIC, 0.25 OR 0.5 MG/DOSE,) 2 MG/3ML SOPN Inject 0.5 mg into the skin once a  week. 03/04/23   Hoy Register, MD  sulfamethoxazole-trimethoprim (BACTRIM DS) 800-160 MG tablet Take 1 tablet by mouth every 12 (twelve) hours. 02/10/23     gemfibrozil (LOPID) 600 MG tablet Take 1 tablet (600 mg total) by mouth 2 (two) times daily before a meal. 07/20/19 11/04/19  [name], NP      Allergies    Lipitor [atorvastatin] and Lisinopril    Review of Systems   Review of Systems  Constitutional:  Positive for fatigue. Negative for fever.  Respiratory:  Negative for shortness of breath.   Cardiovascular:  Negative for chest pain.  Gastrointestinal:  Positive for  abdominal pain, blood in stool and diarrhea. Negative for anal bleeding and vomiting.  Genitourinary:  Negative for flank pain.  Musculoskeletal:  Negative for back pain.  Skin:  Negative for rash.  Neurological:  Negative for headaches.    Physical Exam Updated Vital Signs BP 106/76 (BP Location: Left Arm)   Pulse (!) 106   Temp 97.9 F (36.6 C) (Oral)   SpO2 93%  Physical Exam Vitals and nursing note reviewed.  Constitutional:      General: He is not in acute distress.    Appearance: Normal appearance.  HENT:     Head: Normocephalic.     Mouth/Throat:     Mouth: Mucous membranes are moist.  Cardiovascular:     Rate and Rhythm: Normal rate.  Pulmonary:     Effort: Pulmonary effort is normal. No respiratory distress.  Abdominal:     General: Bowel sounds are decreased.     Palpations: Abdomen is soft.     Tenderness: There is generalized abdominal tenderness. There is no guarding.  Skin:    General: Skin is warm.  Neurological:     Mental Status: He is alert and oriented to person, place, and time. Mental status is at baseline.  Psychiatric:        Mood and Affect: Mood normal.     ED Results / Procedures / Treatments   Labs (all labs ordered are listed, but only abnormal results are displayed) Labs Reviewed  CBC WITH DIFFERENTIAL/PLATELET  COMPREHENSIVE METABOLIC PANEL  LIPASE, BLOOD  URINALYSIS, ROUTINE W REFLEX MICROSCOPIC    EKG EKG Interpretation Date/Time:  Wednesday March 15 2023 11:11:13 EST Ventricular Rate:  106 PR Interval:  153 QRS Duration:  110 QT Interval:  343 QTC Calculation: 456 R Axis:   -11  Text Interpretation: Sinus tachycardia Abnormal R-wave progression, late transition Confirmed by Coralee Pesa [phone]) on 03/15/2023 11:40:16 AM  Radiology No results found.  Procedures Procedures    Medications Ordered in ED Medications - No data to display  ED Course/ Medical Decision Making/ A&P                                  Medical Decision Making Amount and/or Complexity of Data Reviewed Labs: ordered. Radiology: ordered.  Risk Prescription drug management.   63 year old male presents emergency department with abdominal pain, diarrhea, episode of bloody stool.  Tachycardic on arrival but resolved on my evaluation.  Abdomen is diffusely tender.  Blood work shows a mild leukocytosis.  Otherwise abdominal labs are normal.  CT of the abdomen pelvis shows postoperative changes in regards to recent urology treatment/stents.  But also identifies infectious versus inflammatory colitis.  No other findings of ischemic colitis.  There is mention of an ileus however patient has normal bowel sounds, he is  passing gas, has tolerated p.o.    Currently patient's pain is controlled, he has normal bowel sounds, is continuing to pass gas.  Will plan for antibiotic treatment of presumed infectious colitis.  Otherwise he is eating and drinking with no difficulty, pain controlled with oral dose.  Will plan for outpatient follow-up.  No indications for emergent admission.  On reevaluation of the CT with ileus I will discontinue the pain medicine.  Patient at this time appears safe and stable for discharge and close outpatient follow up. Discharge plan and strict return to ED precautions discussed, patient verbalizes understanding and agreement.        Final Clinical Impression(s) / ED Diagnoses Final diagnoses:  None    Rx / DC Orders ED Discharge Orders     None         Rozelle Logan, DO 03/15/23 1653

## 2023-03-15 NOTE — Discharge Instructions (Addendum)
You have been seen and discharged from the emergency department.  Your workup today shows that you have colitis.  We also noted postoperative changes in regards to the recent stent that you had.  You have been prescribed antibiotics and pain control.  Take pain medicine as directed.  Do not mix this medication with alcohol or other sedating medications. Do not drive or do heavy physical activity until you know how this medication affects you.  It may cause drowsiness.  Stay well-hydrated. Start a bland soft diet and advance as tolerated.  Follow-up with your primary provider for further evaluation and further care. Take home medications as prescribed. If you have any worsening symptoms or further concerns for your health please return to an emergency department for further evaluation.

## 2023-03-15 NOTE — ED Triage Notes (Signed)
Pt BIB EMS from Home due to pt c/o lower abdominal pain, and bloody stools since yesterday. Abd pain is relieved when sitting or lying down. Pt report bright red blood in stool. Pt had a kidney stent removed yesterday.  BP 134/80 CBG 186 Hx Type 2 DM

## 2023-03-16 ENCOUNTER — Other Ambulatory Visit: Payer: Self-pay

## 2023-03-21 ENCOUNTER — Other Ambulatory Visit: Payer: Self-pay | Admitting: Nurse Practitioner

## 2023-03-21 ENCOUNTER — Other Ambulatory Visit: Payer: Self-pay | Admitting: Family Medicine

## 2023-03-21 ENCOUNTER — Other Ambulatory Visit: Payer: Self-pay

## 2023-03-21 DIAGNOSIS — E1165 Type 2 diabetes mellitus with hyperglycemia: Secondary | ICD-10-CM

## 2023-03-21 MED ORDER — LANTUS SOLOSTAR 100 UNIT/ML ~~LOC~~ SOPN
40.0000 [IU] | PEN_INJECTOR | Freq: Two times a day (BID) | SUBCUTANEOUS | 0 refills | Status: DC
  Filled 2023-03-21 – 2023-03-22 (×2): qty 24, 30d supply, fill #0

## 2023-03-22 ENCOUNTER — Other Ambulatory Visit: Payer: Self-pay

## 2023-03-22 ENCOUNTER — Other Ambulatory Visit: Payer: Self-pay | Admitting: Nurse Practitioner

## 2023-03-22 ENCOUNTER — Other Ambulatory Visit (HOSPITAL_COMMUNITY): Payer: Self-pay

## 2023-03-22 DIAGNOSIS — E1165 Type 2 diabetes mellitus with hyperglycemia: Secondary | ICD-10-CM

## 2023-03-27 ENCOUNTER — Other Ambulatory Visit: Payer: Self-pay

## 2023-03-27 ENCOUNTER — Other Ambulatory Visit: Payer: Self-pay | Admitting: Nurse Practitioner

## 2023-03-27 ENCOUNTER — Other Ambulatory Visit: Payer: Self-pay | Admitting: Family Medicine

## 2023-03-27 DIAGNOSIS — E1165 Type 2 diabetes mellitus with hyperglycemia: Secondary | ICD-10-CM

## 2023-03-27 MED ORDER — DAPAGLIFLOZIN PROPANEDIOL 10 MG PO TABS
10.0000 mg | ORAL_TABLET | Freq: Every day | ORAL | 1 refills | Status: DC
  Filled 2023-03-27 – 2023-07-12 (×4): qty 90, 90d supply, fill #0

## 2023-03-27 MED ORDER — LOSARTAN POTASSIUM 25 MG PO TABS
25.0000 mg | ORAL_TABLET | Freq: Every day | ORAL | 1 refills | Status: DC
  Filled 2023-03-27 – 2023-07-12 (×4): qty 90, 90d supply, fill #0

## 2023-04-07 ENCOUNTER — Other Ambulatory Visit: Payer: Self-pay

## 2023-04-07 MED ORDER — SULFAMETHOXAZOLE-TRIMETHOPRIM 800-160 MG PO TABS
1.0000 | ORAL_TABLET | Freq: Two times a day (BID) | ORAL | 0 refills | Status: DC
  Filled 2023-04-07: qty 20, 10d supply, fill #0

## 2023-04-11 ENCOUNTER — Other Ambulatory Visit: Payer: Self-pay

## 2023-04-11 ENCOUNTER — Other Ambulatory Visit: Payer: Self-pay | Admitting: Family Medicine

## 2023-04-11 DIAGNOSIS — E1165 Type 2 diabetes mellitus with hyperglycemia: Secondary | ICD-10-CM

## 2023-04-11 MED ORDER — TECHLITE PLUS PEN NEEDLES 32G X 4 MM MISC
1.0000 | Freq: Two times a day (BID) | 0 refills | Status: DC
  Filled 2023-04-11 – 2023-04-12 (×2): qty 100, 50d supply, fill #0

## 2023-04-12 ENCOUNTER — Other Ambulatory Visit: Payer: Self-pay

## 2023-04-12 ENCOUNTER — Other Ambulatory Visit (HOSPITAL_COMMUNITY): Payer: Self-pay

## 2023-04-12 ENCOUNTER — Ambulatory Visit: Payer: Medicaid Other | Admitting: Nurse Practitioner

## 2023-04-14 ENCOUNTER — Other Ambulatory Visit: Payer: Self-pay

## 2023-04-14 ENCOUNTER — Other Ambulatory Visit (HOSPITAL_COMMUNITY): Payer: Self-pay

## 2023-04-14 ENCOUNTER — Other Ambulatory Visit: Payer: Self-pay | Admitting: Nurse Practitioner

## 2023-04-14 DIAGNOSIS — E1165 Type 2 diabetes mellitus with hyperglycemia: Secondary | ICD-10-CM

## 2023-04-14 MED ORDER — INSULIN GLARGINE 100 UNIT/ML SOLOSTAR PEN
40.0000 [IU] | PEN_INJECTOR | Freq: Two times a day (BID) | SUBCUTANEOUS | 0 refills | Status: DC
  Filled 2023-04-14 (×2): qty 24, 30d supply, fill #0

## 2023-05-05 ENCOUNTER — Other Ambulatory Visit: Payer: Self-pay | Admitting: Nurse Practitioner

## 2023-05-05 ENCOUNTER — Other Ambulatory Visit: Payer: Self-pay

## 2023-05-05 DIAGNOSIS — E1165 Type 2 diabetes mellitus with hyperglycemia: Secondary | ICD-10-CM

## 2023-05-05 MED ORDER — ACCU-CHEK GUIDE TEST VI STRP
ORAL_STRIP | Freq: Two times a day (BID) | 0 refills | Status: DC
  Filled 2023-05-05: qty 200, fill #0

## 2023-05-05 MED ORDER — SULFAMETHOXAZOLE-TRIMETHOPRIM 800-160 MG PO TABS
1.0000 | ORAL_TABLET | Freq: Two times a day (BID) | ORAL | 0 refills | Status: DC
Start: 2023-05-05 — End: 2023-07-29
  Filled 2023-05-05: qty 20, 10d supply, fill #0

## 2023-05-08 ENCOUNTER — Other Ambulatory Visit: Payer: Self-pay

## 2023-05-10 ENCOUNTER — Encounter: Payer: Self-pay | Admitting: Nurse Practitioner

## 2023-05-10 ENCOUNTER — Ambulatory Visit: Payer: Medicaid Other | Attending: Nurse Practitioner | Admitting: Nurse Practitioner

## 2023-05-10 ENCOUNTER — Other Ambulatory Visit: Payer: Self-pay

## 2023-05-10 VITALS — BP 136/87 | HR 92 | Resp 19 | Ht 72.0 in | Wt 231.2 lb

## 2023-05-10 DIAGNOSIS — D649 Anemia, unspecified: Secondary | ICD-10-CM | POA: Diagnosis not present

## 2023-05-10 DIAGNOSIS — E785 Hyperlipidemia, unspecified: Secondary | ICD-10-CM

## 2023-05-10 DIAGNOSIS — Z794 Long term (current) use of insulin: Secondary | ICD-10-CM

## 2023-05-10 DIAGNOSIS — I10 Essential (primary) hypertension: Secondary | ICD-10-CM

## 2023-05-10 DIAGNOSIS — E1165 Type 2 diabetes mellitus with hyperglycemia: Secondary | ICD-10-CM | POA: Diagnosis not present

## 2023-05-10 DIAGNOSIS — E1169 Type 2 diabetes mellitus with other specified complication: Secondary | ICD-10-CM | POA: Diagnosis not present

## 2023-05-10 DIAGNOSIS — Z789 Other specified health status: Secondary | ICD-10-CM

## 2023-05-10 LAB — POCT GLYCOSYLATED HEMOGLOBIN (HGB A1C): Hemoglobin A1C: 6.8 % — AB (ref 4.0–5.6)

## 2023-05-10 MED ORDER — INSULIN GLARGINE 100 UNIT/ML SOLOSTAR PEN
40.0000 [IU] | PEN_INJECTOR | Freq: Two times a day (BID) | SUBCUTANEOUS | 6 refills | Status: DC
  Filled 2023-05-10 – 2023-07-25 (×4): qty 45, 56d supply, fill #0
  Filled 2023-09-14: qty 45, 56d supply, fill #1

## 2023-05-10 MED ORDER — ONE-A-DAY MENS PO TABS
1.0000 | ORAL_TABLET | Freq: Every day | ORAL | 3 refills | Status: AC
  Filled 2023-05-10 – 2023-06-14 (×2): qty 90, 90d supply, fill #0

## 2023-05-10 MED ORDER — ACCU-CHEK GUIDE TEST VI STRP
ORAL_STRIP | Freq: Two times a day (BID) | 6 refills | Status: AC
  Filled 2023-05-10: qty 200, fill #0
  Filled 2023-05-24 – 2023-08-10 (×3): qty 200, 100d supply, fill #0
  Filled 2023-12-14: qty 200, 100d supply, fill #1
  Filled 2024-03-27 (×2): qty 200, 100d supply, fill #2

## 2023-05-10 NOTE — Progress Notes (Signed)
 Assessment & Plan:  [name] was seen today for medical management of chronic issues.  Diagnoses and all orders for this visit:  Type 2 diabetes mellitus with hyperglycemia, with long-term current use of insulin (HCC) -     POCT glycosylated hemoglobin (Hb A1C) -     glucose blood (ACCU-CHEK GUIDE TEST) test strip; Use to check blood sugar by fingerstick 2 (two) times daily. -     insulin glargine (LANTUS) 100 UNIT/ML Solostar Pen; Inject 40 Units into the skin 2 (two) times daily. -     Urine Albumin/Creatinine with ratio (send out) [LAB689] Continue blood sugar control as discussed in office today, low carbohydrate diet, and regular physical exercise as tolerated, 150 minutes per week (30 min each day, 5 days per week, or 50 min 3 days per week). Keep blood sugar logs with fasting goal of 90-130 mg/dl, post prandial (after you eat) less than 180.  For Hypoglycemia: BS <60 and Hyperglycemia BS >400; contact the clinic ASAP. Annual eye exams and foot exams are recommended.   Hyperlipidemia associated with type 2 diabetes mellitus (HCC) -     Lipid panel INSTRUCTIONS: Work on a low fat, heart healthy diet and participate in regular aerobic exercise program by working out at least 150 minutes per week; 5 days a week-30 minutes per day. Avoid red meat/beef/steak,  fried foods. junk foods, sodas, sugary drinks, unhealthy snacking, alcohol and smoking.  Drink at least 80 oz of water per day and monitor your carbohydrate intake daily.    Normocytic anemia -     CBC with Differential -     Iron, TIBC and Ferritin Panel -     multivitamin (ONE-A-DAY MEN'S) TABS tablet; Take 1 tablet by mouth daily.  Statin intolerance Continue lovaza as prescribed.  INSTRUCTIONS: Work on a low fat, heart healthy diet and participate in regular aerobic exercise program by working out at least 150 minutes per week; 5 days a week-30 minutes per day. Avoid red meat/beef/steak,  fried foods. junk foods, sodas, sugary  drinks, unhealthy snacking, alcohol and smoking.  Drink at least 80 oz of water per day and monitor your carbohydrate intake daily.    Primary hypertension Continue all antihypertensives as prescribed.  Reminded to bring in blood pressure log for follow  up appointment.  RECOMMENDATIONS: DASH/Mediterranean Diets are healthier choices for HTN.      Patient has been counseled on age-appropriate routine health concerns for screening and prevention. These are reviewed and up-to-date. Referrals have been placed accordingly. Immunizations are up-to-date or declined.    Subjective:   Chief Complaint  Patient presents with   Medical Management of Chronic Issues    Shane Bond 63 y.o. male presents to office today for follow up to HTN and DM.   PMHx significant for HTN, HLD, CAD with MI (inferior wall), CVA c/b neurogenic bladder with frequent UTI and nephrolithiasis (utilizes [facility]), T2DM, BPH, prostate CA.   Currently near completion of antibiotic bactrim s/p cystoscopy ureteroscopy with laser lithotripsy   Blood pressure is controlled.  BP Readings from Last 3 Encounters:  05/10/23 136/87  03/15/23 121/69  02/22/23 107/63    A1c at goal of <7.  Lab Results  Component Value Date   HGBA1C 6.8 (A) 05/10/2023  LDL not at goal. He had a skin reaction causing rash from taking a statin in the past. Currently prescribed lovaza 2g BID.   Lab Results  Component Value Date   LDLCALC 132 (H) 05/10/2023  Review of Systems  Constitutional:  Negative for fever, malaise/fatigue and weight loss.  HENT: Negative.  Negative for nosebleeds.   Eyes: Negative.  Negative for blurred vision, double vision and photophobia.  Respiratory: Negative.  Negative for cough and shortness of breath.   Cardiovascular: Negative.  Negative for chest pain, palpitations and leg swelling.  Gastrointestinal: Negative.  Negative for heartburn, nausea and vomiting.  Musculoskeletal: Negative.  Negative for  myalgias.  Neurological: Negative.  Negative for dizziness, focal weakness, seizures and headaches.  Psychiatric/Behavioral: Negative.  Negative for suicidal ideas.     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   Anemia    Bladder atonia    BPH (benign prostatic hyperplasia)    CAD, NATIVE VESSEL 10/29/2009   Diabetes mellitus type 2, uncontrolled, with complications    Hyperlipidemia LDL goal <70    Hypertension    Kidney stone    Neurogenic bladder as late effect of cerebrovascular accident (CVA)    OBESITY 10/22/2009   Prostate cancer [facility])     Past Surgical History:  Procedure Laterality Date   BACK SURGERY     CORONARY STENT PLACEMENT     CYSTOSCOPY W/ URETERAL STENT PLACEMENT  02/26/2012   Procedure: CYSTOSCOPY WITH RETROGRADE PYELOGRAM/URETERAL STENT PLACEMENT;  Surgeon: Livingston Rigg, MD;  Location: [facility];  Service: Urology;  Laterality: Left;   CYSTOSCOPY W/ URETERAL STENT PLACEMENT Left 11/16/2022   Procedure: 1. Cystoscopy 2. left ureteral stent placement 3. left retrograde pyelography with interpretation;  Surgeon: Andrez Banker, MD;  Location: [facility];  Service: Urology;  Laterality: Left;   CYSTOSCOPY WITH RETROGRADE PYELOGRAM, URETEROSCOPY AND STENT PLACEMENT Left 08/07/2019   Procedure: CYSTOSCOPY WITH LEFT RETROGRADE PYELOGRAM, URETEROSCOPY HOLMIUM LASER AND STENT PLACEMENT;  Surgeon: Andrez Banker, MD;  Location: [facility];  Service: Urology;  Laterality: Left;   CYSTOSCOPY WITH STENT PLACEMENT Left 06/24/2019   Procedure: CYSTOSCOPY WITH STENT PLACEMENT LEFT URETER WITH LEFT RETROGRADE URETERAL;  Surgeon: Adelbert Homans, MD;  Location: [facility];  Service: Urology;  Laterality: Left;   testicles Right     Family History  Problem Relation Age of Onset   Hypertension Mother    Diabetes Father    Stomach cancer Maternal Uncle    Diabetes Paternal Aunt    Coronary artery disease Other    Heart attack Other    Breast cancer  Neg Hx    Colon cancer Neg Hx    Pancreatic cancer Neg Hx    Prostate cancer Neg Hx    Esophageal cancer Neg Hx    Liver disease Neg Hx     Social History Reviewed with no changes to be made today.   Outpatient Medications Prior to Visit  Medication Sig Dispense Refill   Accu-Chek Softclix Lancets lancets Use as instructed to check blood glucose level by fingerstick twice per day. 100 each 12   Aspirin 81 MG CAPS Take 81 mg by mouth daily.     Blood Glucose Monitoring Suppl (ACCU-CHEK GUIDE) w/Device KIT Use as instructed to check blood glucose level by fingerstick twice per day 1 kit 0   dapagliflozin propanediol (FARXIGA) 10 MG TABS tablet Take 1 tablet (10 mg total) by mouth daily before breakfast. 90 tablet 1   Insulin Pen Needle (TECHLITE PLUS PEN NEEDLES) 32G X 4 MM MISC Inject as directed 2 (two) times daily. 100 each 0   losartan (COZAAR) 25 MG tablet Take 1 tablet (25 mg total)  by mouth daily. 90 tablet 1   Misc. Devices (CAREX COCCYX CUSHION) MISC Use daily when sitting for pressure relief ICD10 L98.429 1 each prn   Misc. Devices MISC Please provide patient with standard sized 23F urinary catheters for self cathing along with lubricant. 50 each prn   omega-3 acid ethyl esters (LOVAZA) 1 g capsule Take 2 capsules (2 g total) by mouth 2 (two) times daily. Please fill as a 90 day supply 360 capsule 3   sulfamethoxazole-trimethoprim (BACTRIM DS) 800-160 MG tablet Take 1 tablet by mouth every 12 (twelve) hours. 20 tablet 0   glucose blood (ACCU-CHEK GUIDE TEST) test strip Use to check blood sugar by fingerstick 2 (two) times daily. 200 each 0   insulin glargine (LANTUS) 100 UNIT/ML Solostar Pen Inject 40 Units into the skin 2 (two) times daily. 24 mL 0   Semaglutide,0.25 or 0.5MG /DOS, (OZEMPIC, 0.25 OR 0.5 MG/DOSE,) 2 MG/3ML SOPN Inject 0.5 mg into the skin once a week. 3 mL 1   amoxicillin-clavulanate (AUGMENTIN) 875-125 MG tablet Take 1 tablet by mouth every 12 (twelve) hours.  (Patient not taking: Reported on 05/10/2023) 14 tablet 0   cefdinir (OMNICEF) 300 MG capsule Take 1 capsule (300 mg total) by mouth 2 (two) times daily FOR 7 DAYS. (Patient not taking: Reported on 05/10/2023) 14 capsule 0   ezetimibe (ZETIA) 10 MG tablet Take 1 tablet (10 mg total) by mouth daily. (Patient not taking: Reported on 05/10/2023) 90 tablet 3   fluconazole (DIFLUCAN) 100 MG tablet Take 1 tablet (100 mg total) by mouth daily. (Patient not taking: Reported on 05/10/2023) 14 tablet 0   ketorolac (TORADOL) 10 MG tablet Take 1 tablet (10 mg total) by mouth every 6 (six) hours as needed. (Patient not taking: Reported on 05/10/2023) 20 tablet 0   ondansetron (ZOFRAN-ODT) 4 MG disintegrating tablet Take 1 tablet (4 mg total) by mouth every 8 (eight) hours as needed for nausea or vomiting. (Patient not taking: Reported on 05/10/2023) 20 tablet 0   No facility-administered medications prior to visit.    Allergies  Allergen Reactions   Lipitor [Atorvastatin] Rash   Lisinopril Rash       Objective:    BP 136/87 (BP Location: Left Arm, Patient Position: Sitting, Cuff Size: Normal)   Pulse 92   Resp 19   Ht 6' (1.829 m)   Wt 231 lb 3.2 oz (104.9 kg)   SpO2 96%   BMI 31.36 kg/m  Wt Readings from Last 3 Encounters:  05/10/23 231 lb 3.2 oz (104.9 kg)  02/21/23 244 lb 14.9 oz (111.1 kg)  01/14/23 245 lb (111.1 kg)    Physical Exam Vitals and nursing note reviewed.  Constitutional:      Appearance: He is well-developed.  HENT:     Head: Normocephalic and atraumatic.  Cardiovascular:     Rate and Rhythm: Normal rate and regular rhythm.     Heart sounds: Normal heart sounds. No murmur heard.    No friction rub. No gallop.  Pulmonary:     Effort: Pulmonary effort is normal. No tachypnea or respiratory distress.     Breath sounds: Normal breath sounds. No decreased breath sounds, wheezing, rhonchi or rales.  Chest:     Chest wall: No tenderness.  Abdominal:     General: Bowel sounds  are normal.     Palpations: Abdomen is soft.  Musculoskeletal:        General: Normal range of motion.     Cervical back: Normal range  of motion.  Skin:    General: Skin is warm and dry.  Neurological:     Mental Status: He is alert and oriented to person, place, and time.     Coordination: Coordination normal.  Psychiatric:        Behavior: Behavior normal. Behavior is cooperative.        Thought Content: Thought content normal.        Judgment: Judgment normal.          Patient has been counseled extensively about nutrition and exercise as well as the importance of adherence with medications and regular follow-up. The patient was given clear instructions to go to ER or return to medical center if symptoms don't improve, worsen or new problems develop. The patient verbalized understanding.   Follow-up: Return in about 3 months (around 08/10/2023).   Collins Dean, FNP-BC [facility] [place], [place] [phone]   06/04/2023, 10:36 PM

## 2023-05-11 LAB — CBC WITH DIFFERENTIAL/PLATELET
Basophils Absolute: 0.1 10*3/uL (ref 0.0–0.2)
Basos: 1 %
EOS (ABSOLUTE): 0.2 10*3/uL (ref 0.0–0.4)
Eos: 4 %
Hematocrit: 40.7 % (ref 37.5–51.0)
Hemoglobin: 13.7 g/dL (ref 13.0–17.7)
Immature Grans (Abs): 0 10*3/uL (ref 0.0–0.1)
Immature Granulocytes: 0 %
Lymphocytes Absolute: 2 10*3/uL (ref 0.7–3.1)
Lymphs: 33 %
MCH: 31.9 pg (ref 26.6–33.0)
MCHC: 33.7 g/dL (ref 31.5–35.7)
MCV: 95 fL (ref 79–97)
Monocytes Absolute: 0.7 10*3/uL (ref 0.1–0.9)
Monocytes: 11 %
Neutrophils Absolute: 3.2 10*3/uL (ref 1.4–7.0)
Neutrophils: 51 %
Platelets: 271 10*3/uL (ref 150–450)
RBC: 4.3 x10E6/uL (ref 4.14–5.80)
RDW: 15.2 % (ref 11.6–15.4)
WBC: 6.2 10*3/uL (ref 3.4–10.8)

## 2023-05-11 LAB — IRON,TIBC AND FERRITIN PANEL
Ferritin: 253 ng/mL (ref 30–400)
Iron Saturation: 22 % (ref 15–55)
Iron: 83 ug/dL (ref 38–169)
Total Iron Binding Capacity: 378 ug/dL (ref 250–450)
UIBC: 295 ug/dL (ref 111–343)

## 2023-05-11 LAB — LIPID PANEL
Chol/HDL Ratio: 5 ratio (ref 0.0–5.0)
Cholesterol, Total: 205 mg/dL — ABNORMAL HIGH (ref 100–199)
HDL: 41 mg/dL (ref >39)
LDL Chol Calc (NIH): 132 mg/dL — ABNORMAL HIGH (ref 0–99)
Triglycerides: 177 mg/dL — ABNORMAL HIGH (ref 0–149)
VLDL Cholesterol Cal: 32 mg/dL (ref 5–40)

## 2023-05-12 LAB — MICROALBUMIN / CREATININE URINE RATIO
Creatinine, Urine: 48.7 mg/dL
Microalb/Creat Ratio: 18 mg/g{creat} (ref 0–29)
Microalbumin, Urine: 8.8 ug/mL

## 2023-05-23 ENCOUNTER — Other Ambulatory Visit: Payer: Self-pay

## 2023-05-24 ENCOUNTER — Other Ambulatory Visit: Payer: Self-pay

## 2023-05-24 ENCOUNTER — Other Ambulatory Visit: Payer: Self-pay | Admitting: Family Medicine

## 2023-05-24 DIAGNOSIS — Z794 Long term (current) use of insulin: Secondary | ICD-10-CM

## 2023-05-24 MED ORDER — OZEMPIC (0.25 OR 0.5 MG/DOSE) 2 MG/3ML ~~LOC~~ SOPN
0.5000 mg | PEN_INJECTOR | SUBCUTANEOUS | 1 refills | Status: DC
  Filled 2023-05-24 – 2023-06-26 (×4): qty 9, 84d supply, fill #0

## 2023-06-04 ENCOUNTER — Encounter: Payer: Self-pay | Admitting: Nurse Practitioner

## 2023-06-13 ENCOUNTER — Emergency Department (HOSPITAL_COMMUNITY)
Admission: EM | Admit: 2023-06-13 | Discharge: 2023-06-14 | Disposition: A | Discharge status: Home / Self Care | Attending: Emergency Medicine | Admitting: Emergency Medicine

## 2023-06-13 ENCOUNTER — Other Ambulatory Visit: Payer: Self-pay

## 2023-06-13 ENCOUNTER — Emergency Department (HOSPITAL_COMMUNITY)

## 2023-06-13 ENCOUNTER — Encounter (HOSPITAL_COMMUNITY): Payer: Self-pay

## 2023-06-13 DIAGNOSIS — Z8546 Personal history of malignant neoplasm of prostate: Secondary | ICD-10-CM | POA: Diagnosis not present

## 2023-06-13 DIAGNOSIS — I2510 Atherosclerotic heart disease of native coronary artery without angina pectoris: Secondary | ICD-10-CM | POA: Diagnosis not present

## 2023-06-13 DIAGNOSIS — N1330 Unspecified hydronephrosis: Secondary | ICD-10-CM | POA: Insufficient documentation

## 2023-06-13 DIAGNOSIS — N179 Acute kidney failure, unspecified: Secondary | ICD-10-CM | POA: Diagnosis not present

## 2023-06-13 DIAGNOSIS — R300 Dysuria: Secondary | ICD-10-CM | POA: Diagnosis present

## 2023-06-13 DIAGNOSIS — Z794 Long term (current) use of insulin: Secondary | ICD-10-CM | POA: Diagnosis not present

## 2023-06-13 DIAGNOSIS — N390 Urinary tract infection, site not specified: Secondary | ICD-10-CM | POA: Diagnosis not present

## 2023-06-13 DIAGNOSIS — I10 Essential (primary) hypertension: Secondary | ICD-10-CM | POA: Insufficient documentation

## 2023-06-13 DIAGNOSIS — N12 Tubulo-interstitial nephritis, not specified as acute or chronic: Secondary | ICD-10-CM | POA: Diagnosis not present

## 2023-06-13 DIAGNOSIS — Z7982 Long term (current) use of aspirin: Secondary | ICD-10-CM | POA: Insufficient documentation

## 2023-06-13 LAB — BASIC METABOLIC PANEL WITH GFR
Anion gap: 14 (ref 5–15)
BUN: 18 mg/dL (ref 8–23)
CO2: 19 mmol/L — ABNORMAL LOW (ref 22–32)
Calcium: 9.7 mg/dL (ref 8.9–10.3)
Chloride: 103 mmol/L (ref 98–111)
Creatinine, Ser: 1.25 mg/dL — ABNORMAL HIGH (ref 0.61–1.24)
GFR, Estimated: >60 mL/min (ref >60)
Glucose, Bld: 261 mg/dL — ABNORMAL HIGH (ref 70–99)
Potassium: 4.4 mmol/L (ref 3.5–5.1)
Sodium: 136 mmol/L (ref 135–145)

## 2023-06-13 LAB — URINALYSIS, ROUTINE W REFLEX MICROSCOPIC
Bilirubin Urine: NEGATIVE
Glucose, UA: >=500 mg/dL — AB
Ketones, ur: NEGATIVE mg/dL
Nitrite: NEGATIVE
Protein, ur: NEGATIVE mg/dL
Specific Gravity, Urine: 1.024 (ref 1.005–1.030)
WBC, UA: >50 WBC/hpf (ref 0–5)
pH: 6 (ref 5.0–8.0)

## 2023-06-13 LAB — CBC
HCT: 39.3 % (ref 39.0–52.0)
Hemoglobin: 13.6 g/dL (ref 13.0–17.0)
MCH: 33 pg (ref 26.0–34.0)
MCHC: 34.6 g/dL (ref 30.0–36.0)
MCV: 95.4 fL (ref 80.0–100.0)
Platelets: 196 10*3/uL (ref 150–400)
RBC: 4.12 MIL/uL — ABNORMAL LOW (ref 4.22–5.81)
RDW: 14.1 % (ref 11.5–15.5)
WBC: 8 10*3/uL (ref 4.0–10.5)
nRBC: 0 % (ref 0.0–0.2)

## 2023-06-13 NOTE — ED Triage Notes (Addendum)
 Patient BIB [facility] from home for left flank pain x 2 days, hx of kidney problems and stones, also reports burning with urination. Patient self-caths at baseline BP 112/60 HR 110 94% RA CBG 289 R 20  97.35F

## 2023-06-14 ENCOUNTER — Other Ambulatory Visit: Payer: Self-pay

## 2023-06-14 ENCOUNTER — Other Ambulatory Visit (HOSPITAL_COMMUNITY): Payer: Self-pay

## 2023-06-14 MED ORDER — CEFPODOXIME PROXETIL 200 MG PO TABS
200.0000 mg | ORAL_TABLET | Freq: Two times a day (BID) | ORAL | 0 refills | Status: AC
  Filled 2023-06-14 – 2023-06-15 (×4): qty 20, 10d supply, fill #0

## 2023-06-14 MED ORDER — SODIUM CHLORIDE 0.9 % IV SOLN
1.0000 g | Freq: Once | INTRAVENOUS | Status: AC
  Administered 2023-06-14: 1 g via INTRAVENOUS
  Filled 2023-06-14: qty 10

## 2023-06-14 NOTE — ED Provider Notes (Signed)
 [place] EMERGENCY DEPARTMENT AT [facility] Provider Note   CSN: [id number] Arrival date & time: 06/13/23  2024     History  Chief Complaint  Patient presents with   Flank Pain    Shane Bond is a 63 y.o. male with PMH as listed below who presents BIB [facility] from home for left flank pain x 2 days, hx of kidney problems and stones, also reports burning with urination and cloudy urine. Patient self-caths at baseline due to spinal cord injury sustained in a back accident at work more than 30 years ago.  Uses a new catheter every time.  Denies any abdominal pain, fever/chills, nausea vomiting diarrhea constipation, hematuria.   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   Anemia    Bladder atonia    BPH (benign prostatic hyperplasia)    CAD, NATIVE VESSEL 10/29/2009   Diabetes mellitus type 2, uncontrolled, with complications    Hyperlipidemia LDL goal <70    Hypertension    Kidney stone    Neurogenic bladder as late effect of cerebrovascular accident (CVA)    OBESITY 10/22/2009   Prostate cancer (HCC)        Home Medications Prior to Admission medications   Medication Sig Start Date End Date Taking? Authorizing Provider  cefpodoxime  (VANTIN ) 200 MG tablet Take 1 tablet (200 mg total) by mouth 2 (two) times daily for 10 days. 06/14/23 06/24/23 Yes Merdis Stalling, MD  Accu-Chek Softclix Lancets lancets Use as instructed to check blood glucose level by fingerstick twice per day. 08/15/22   Fleming, Zelda W, NP  Aspirin  81 MG CAPS Take 81 mg by mouth daily.    [provider]  Blood Glucose Monitoring Suppl (ACCU-CHEK GUIDE) w/Device KIT Use as instructed to check blood glucose level by fingerstick twice per day 04/26/21   Fleming, Zelda W, NP  dapagliflozin  propanediol (FARXIGA ) 10 MG TABS tablet Take 1 tablet (10 mg total) by mouth daily before breakfast. 03/27/23   Newlin, Enobong, MD  glucose blood (ACCU-CHEK GUIDE TEST) test strip Use  to check blood sugar by fingerstick 2 (two) times daily. 05/10/23   Fleming, Zelda W, NP  insulin  glargine (LANTUS ) 100 UNIT/ML Solostar Pen Inject 40 Units into the skin 2 (two) times daily. 05/10/23   Fleming, Zelda W, NP  Insulin  Pen Needle (TECHLITE PLUS PEN NEEDLES) 32G X 4 MM MISC Inject as directed 2 (two) times daily. 04/11/23   Newlin, Enobong, MD  losartan  (COZAAR ) 25 MG tablet Take 1 tablet (25 mg total) by mouth daily. 03/27/23   Collins Dean, NP  Misc. Devices (CAREX COCCYX CUSHION) MISC Use daily when sitting for pressure relief ICD10 L98.429 07/02/20   Collins Dean, NP  Misc. Devices MISC Please provide patient with standard sized 27F urinary catheters for self cathing along with lubricant. 05/29/18   Fleming, Zelda W, NP  multivitamin (ONE-A-DAY MEN'S) TABS tablet Take 1 tablet by mouth daily. 05/10/23   Fleming, Zelda W, NP  omega-3 acid ethyl esters (LOVAZA ) 1 g capsule Take 2 capsules (2 g total) by mouth 2 (two) times daily. Please fill as a 90 day supply 08/15/22   Collins Dean, NP  Semaglutide ,0.25 or 0.5MG /DOS, (OZEMPIC , 0.25 OR 0.5 MG/DOSE,) 2 MG/3ML SOPN Inject 0.5 mg into the skin once a week. 05/24/23   Newlin, Enobong, MD  sulfamethoxazole -trimethoprim  (BACTRIM  DS) 800-160 MG tablet Take 1 tablet by mouth every 12 (twelve) hours. 05/05/23  gemfibrozil  (LOPID ) 600 MG tablet Take 1 tablet (600 mg total) by mouth 2 (two) times daily before a meal. 07/20/19 11/04/19  Fleming, Zelda W, NP      Allergies    Lipitor [atorvastatin ] and Lisinopril     Review of Systems   Review of Systems A 10 point review of systems was performed and is negative unless otherwise reported in HPI.  Physical Exam Updated Vital Signs BP 108/67   Pulse 80   Temp 98.2 F (36.8 C)   Resp 19   Ht 6' (1.829 m)   Wt 111.1 kg   SpO2 90%   BMI 33.23 kg/m  Physical Exam General: Normal appearing male, lying in bed.  HEENT: Sclera anicteric, MMM, trachea midline.  Cardiology: RRR, no  murmurs/rubs/gallops.Aaron Aas  Resp: Normal respiratory rate and effort. CTAB, no wheezes, rhonchi, crackles.  Abd: Soft, non-tender, non-distended. No rebound tenderness or guarding.  GU: Deferred. MSK: No peripheral edema or signs of trauma. Extremities without deformity or TTP. No cyanosis or clubbing. Skin: warm, dry. No rashes or lesions. Back: +L CVA tenderness Neuro: A&Ox4, CNs II-XII grossly intact. MAEs. Sensation grossly intact.  Psych: Normal mood and affect.   ED Results / Procedures / Treatments   Labs (all labs ordered are listed, but only abnormal results are displayed) Labs Reviewed  URINALYSIS, ROUTINE W REFLEX MICROSCOPIC - Abnormal; Notable for the following components:      Result Value   APPearance HAZY (*)    Glucose, UA >=500 (*)    Hgb urine dipstick SMALL (*)    Leukocytes,Ua LARGE (*)    Bacteria, UA FEW (*)    All other components within normal limits  CBC - Abnormal; Notable for the following components:   RBC 4.12 (*)    All other components within normal limits  BASIC METABOLIC PANEL WITH GFR - Abnormal; Notable for the following components:   CO2 19 (*)    Glucose, Bld 261 (*)    Creatinine, Ser 1.25 (*)    All other components within normal limits  URINE CULTURE    EKG None  Radiology CT Renal Stone Study Result Date: 06/13/2023 CLINICAL DATA:  Abdominal/flank pain.  Stones suspected EXAM: CT ABDOMEN AND PELVIS WITHOUT CONTRAST TECHNIQUE: Multidetector CT imaging of the abdomen and pelvis was performed following the standard protocol without IV contrast. RADIATION DOSE REDUCTION: This exam was performed according to the departmental dose-optimization program which includes automated exposure control, adjustment of the mA and/or kV according to patient size and/or use of iterative reconstruction technique. COMPARISON:  CT 03/15/2023 FINDINGS: Lower chest: No acute abnormality. Hepatobiliary: Unremarkable liver. Normal gallbladder. No biliary dilation.  Pancreas: Unremarkable. Spleen: Unremarkable. Adrenals/Urinary Tract: Normal adrenal glands. Punctate nonobstructing right nephrolithiasis. Duplicated left renal collecting system. Chronic hydroureteronephrosis of the lower pole moiety with abrupt tapering in the proximal lower pole ureter (series 6/image 50). No left urinary calculi. Bladder is unremarkable. Stomach/Bowel: Normal caliber large and small bowel. No bowel wall thickening. The appendix is normal.Stomach is within normal limits. Vascular/Lymphatic: No lymphadenopathy. Advanced arterial atherosclerotic calcification including the aorta and its mesenteric head danced aorto bi iliac atherosclerotic calcification. 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 Reproductive: Unremarkable. Other: Supraumbilical hernia containing a nonobstructed loop of small bowel. No free intraperitoneal fluid or air. Musculoskeletal: No acute fracture. IMPRESSION: 1. 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 This appears similar to 03/15/2023. This is  favored to represent a chronic stricture however urothelial malignancy is not excluded. 2. Punctate nonobstructing right nephrolithiasis. 3. Supraumbilical hernia containing a nonobstructed loop of small bowel. 4. Aortic Atherosclerosis (ICD10-I70.0). Electronically Signed   By: Rozell Cornet M.D.   On: 06/13/2023 22:38    Procedures Procedures    Medications Ordered in ED Medications  cefTRIAXone  (ROCEPHIN ) 1 g in sodium chloride  0.9 % 100 mL IVPB (1 g Intravenous New Bag/Given 06/14/23 0746)    ED Course/ Medical Decision Making/ A&P                          Medical Decision Making Amount and/or Complexity of Data Reviewed Labs: ordered. Decision-making details documented in ED Course. Radiology:  Decision-making details documented in ED Course.  Risk Prescription drug  management.    This patient presents to the ED for concern of L flank pain and urinary sxs, this involves an extensive number of treatment options, and is a complaint that carries with it a high risk of complications and morbidity.  I considered the following differential and admission for this acute, potentially life threatening condition.  Patient is hemodynamically stable and overall very well-appearing. Non-toxic.   MDM:    DDX for flank pain includes but is not limited to:  Most likely pyelonephritis versus ureterolithiasis given patient's UTI symptoms and flank pain.  Also considered but lower on the differential include perinephric abscess, obstructive uropathy, peptic ulcer disease, malignancy.  No chest pain or respiratory symptoms to indicate pneumonia or PE.  No abdominal pain or nausea vomiting to indicate appendicitis, SBO, pancreatitis, aortic dissection.   Clinical Course as of 06/14/23 0757  Wed Jun 14, 2023  0706 Urinalysis, Routine w reflex microscopic -Urine, Clean Catch(!) +UTI [HN]  0706 Creatinine(!): 1.25 +Mild AKI [HN]  0706 WBC: 8.0 No leukocytosis  [HN]  0707 CT Renal Stone Study 1. 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 This appears similar to 03/15/2023. This is favored to represent a chronic stricture however urothelial malignancy is not excluded. 2. Punctate nonobstructing right nephrolithiasis. 3. Supraumbilical hernia containing a nonobstructed loop of small bowel. 4. Aortic Atherosclerosis (ICD10-I70.0).   [HN]  [phone] Patient with flank pain, mild chronic hydronephrosis likely d/t stricture similarly  noted on imaging in January, and +UTI w/ pyelonephritis. He has been afebrile with no white count, low c/f sepsis. Has cultures on chart review of E coli, enterobacter, and serratia just within the last year. Will treat w/ IV ceftriaxone   here.  [HN]  [id number] Patient will be given cefpodoxime  rx x 10 days and Is informed about his findings on CT. He was previously unaware of the hydro and possible stricture. Instructed to f/u with urology within 1-2 weeks. Instructed to stay well hydrated as well. DC w/ discharge instructions/return precautions. All questions answered to patient's satisfaction.   [HN]    Clinical Course User Index [HN] Merdis Stalling, MD    Labs: I Ordered, and personally interpreted labs.  The pertinent results include: Those listed above  Imaging Studies ordered: CT renal stone study ordered in triage I independently visualized and interpreted imaging. I agree with the radiologist interpretation  Additional history obtained from chart review  Reevaluation: After the interventions noted above, I reevaluated the patient and found that they have :improved  Social Determinants of Health:  lives independently  Disposition:  DC   Co  morbidities that complicate the patient evaluation  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   Anemia    Bladder atonia    BPH (benign prostatic hyperplasia)    CAD, NATIVE VESSEL 10/29/2009   Diabetes mellitus type 2, uncontrolled, with complications    Hyperlipidemia LDL goal <70    Hypertension    Kidney stone    Neurogenic bladder as late effect of cerebrovascular accident (CVA)    OBESITY 10/22/2009   Prostate cancer (HCC)      Medicines Meds ordered this encounter  Medications   cefTRIAXone  (ROCEPHIN ) 1 g in sodium chloride  0.9 % 100 mL IVPB    Antibiotic Indication::   UTI   cefpodoxime  (VANTIN ) 200 MG tablet    Sig: Take 1 tablet (200 mg total) by mouth 2 (two) times daily for 10 days.    Dispense:  20 tablet    Refill:  0    I have reviewed the patients home medicines and have made adjustments as needed  Problem List / ED Course: Problem List Items Addressed This Visit   None Visit Diagnoses       Pyelonephritis    -   Primary   Relevant Medications   cefTRIAXone  (ROCEPHIN ) 1 g in sodium chloride  0.9 % 100 mL IVPB   cefpodoxime  (VANTIN ) 200 MG tablet     AKI (acute kidney injury) (HCC)         Hydroureteronephrosis                       This note was created using dictation software, which may contain spelling or grammatical errors.    Merdis Stalling, MD 06/14/23 [phone]

## 2023-06-14 NOTE — Discharge Instructions (Addendum)
 Thank you for coming to [facility] Emergency Department. You were seen for left flank pain and urinary symptoms. We did an exam, labs, and imaging, and these showed a urinary tract infection.  With pain in your left side this is likely pyelonephritis, and infection in the kidney.  Please take cefpodoxime  200 mg twice per day for 10 days.  Your imaging also showed a narrowing of the ureter on the left side causing mild swelling of the kidney concerning for a possible stricture. Please follow up with your urologist within 1-2 weeks. You can take tylenol  1,000 mg every 8 hours for pain. Please stay well hydrated at home.   Do not hesitate to return to the ED or call 911 if you experience: -Worsening symptoms -Nausea/vomiting so severe you cannot eat/drink or take your medications -Lightheadedness, passing out -Fevers/chills -Anything else that concerns you

## 2023-06-15 ENCOUNTER — Other Ambulatory Visit: Payer: Self-pay

## 2023-06-16 LAB — URINE CULTURE: Culture: 20000 — AB

## 2023-06-17 ENCOUNTER — Telehealth (HOSPITAL_BASED_OUTPATIENT_CLINIC_OR_DEPARTMENT_OTHER): Payer: Self-pay | Admitting: *Deleted

## 2023-06-17 NOTE — Telephone Encounter (Signed)
 Post ED Visit - Positive Culture Follow-up  Culture report reviewed by antimicrobial stewardship pharmacist: [facility] []  Court Distance, Pharm.D. []  Skeet Duke, Pharm.D., BCPS AQ-ID []  Leslee Rase, Pharm.D., BCPS []  Garland Junk, Pharm.D., BCPS []  [place], [place].D., BCPS, AAHIVP []  Alcide Aly, Pharm.D., BCPS, AAHIVP []  Jerri Morale, PharmD, BCPS []  Graham Laws, PharmD, BCPS []  Cleda Curly, PharmD, BCPS []  Tamar Fairly, PharmD []  Ballard Levels, PharmD, BCPS [x] Dionicio Fray, PharmD  [facility] []  Arlyne Bering, PharmD []  Sherryle Don, PharmD []  Van Gelinas, PharmD []  Delila Felty, Rph []  [place]) Cleora Daft, PharmD []  Augustina Block, PharmD []  Arie Kurtz, PharmD []  Sharlyn Deaner, PharmD []  Agnes Hose, PharmD []  Kendall Pauls, PharmD []  Gladstone Lamer, PharmD []  Armanda Bern, PharmD []  Tera Fellows, PharmD   Positive urine culture Treated with Cefpodoxime  Proxetil, organism sensitive to the same and no further patient follow-up is required at this time.  Shane Bond 06/17/2023, 2:10 PM

## 2023-06-19 ENCOUNTER — Other Ambulatory Visit: Payer: Self-pay

## 2023-06-19 ENCOUNTER — Other Ambulatory Visit: Payer: Self-pay | Admitting: Family Medicine

## 2023-06-19 ENCOUNTER — Other Ambulatory Visit (HOSPITAL_COMMUNITY): Payer: Self-pay

## 2023-06-19 DIAGNOSIS — E1165 Type 2 diabetes mellitus with hyperglycemia: Secondary | ICD-10-CM

## 2023-06-19 MED ORDER — TECHLITE PLUS PEN NEEDLES 32G X 4 MM MISC
1.0000 | Freq: Two times a day (BID) | 6 refills | Status: AC
  Filled 2023-06-19: qty 200, 100d supply, fill #0
  Filled 2023-09-22: qty 200, 100d supply, fill #1
  Filled 2024-01-11: qty 200, 100d supply, fill #2

## 2023-06-21 ENCOUNTER — Other Ambulatory Visit (HOSPITAL_COMMUNITY): Payer: Self-pay

## 2023-06-22 ENCOUNTER — Other Ambulatory Visit: Payer: Self-pay

## 2023-06-26 ENCOUNTER — Other Ambulatory Visit: Payer: Self-pay

## 2023-06-26 ENCOUNTER — Other Ambulatory Visit (HOSPITAL_COMMUNITY): Payer: Self-pay

## 2023-07-04 ENCOUNTER — Telehealth: Payer: Self-pay | Admitting: Nurse Practitioner

## 2023-07-04 NOTE — Telephone Encounter (Signed)
 Contacted pt resch appt!

## 2023-07-12 ENCOUNTER — Other Ambulatory Visit: Payer: Self-pay

## 2023-07-25 ENCOUNTER — Other Ambulatory Visit (HOSPITAL_COMMUNITY): Payer: Self-pay

## 2023-07-25 ENCOUNTER — Other Ambulatory Visit: Payer: Self-pay

## 2023-07-29 ENCOUNTER — Ambulatory Visit (HOSPITAL_COMMUNITY)
Admission: EM | Admit: 2023-07-29 | Discharge: 2023-07-29 | Disposition: A | Discharge status: Home / Self Care | Attending: Family Medicine | Admitting: Family Medicine

## 2023-07-29 ENCOUNTER — Encounter (HOSPITAL_COMMUNITY): Payer: Self-pay

## 2023-07-29 ENCOUNTER — Ambulatory Visit (INDEPENDENT_AMBULATORY_CARE_PROVIDER_SITE_OTHER)

## 2023-07-29 DIAGNOSIS — R062 Wheezing: Secondary | ICD-10-CM

## 2023-07-29 DIAGNOSIS — J441 Chronic obstructive pulmonary disease with (acute) exacerbation: Secondary | ICD-10-CM

## 2023-07-29 DIAGNOSIS — J019 Acute sinusitis, unspecified: Secondary | ICD-10-CM

## 2023-07-29 MED ORDER — PREDNISONE 20 MG PO TABS: 40.0000 mg | ORAL_TABLET | Freq: Every day | ORAL | 0 refills | Status: AC

## 2023-07-29 MED ORDER — BENZONATATE 100 MG PO CAPS: 100.0000 mg | ORAL_CAPSULE | Freq: Three times a day (TID) | ORAL | 0 refills | Status: AC | PRN

## 2023-07-29 MED ORDER — DOXYCYCLINE HYCLATE 100 MG PO CAPS: 100.0000 mg | ORAL_CAPSULE | Freq: Two times a day (BID) | ORAL | 0 refills | Status: AC

## 2023-07-29 MED ORDER — ALBUTEROL SULFATE HFA 108 (90 BASE) MCG/ACT IN AERS: 2.0000 | INHALATION_SPRAY | RESPIRATORY_TRACT | 0 refills | Status: AC | PRN

## 2023-07-29 NOTE — Discharge Instructions (Signed)
 Your chest x-ray is read as normal.  There is no sign of pneumonia or fluid.  Albuterol  inhaler--do 2 puffs every 4 hours as needed for shortness of breath or wheezing  Take prednisone  20 mg--2 daily for 5 days; this is for inflammation in your lungs  Take doxycycline  100 mg --1 capsule 2 times daily for 7 days; this is an antibiotic for possible sinusitis.  Take benzonatate  100 mg, 1 tab every 8 hours as needed for cough.

## 2023-07-29 NOTE — ED Provider Notes (Signed)
 [facility]    CSN: [id number] Arrival date & time: 07/29/23  1143      History   Chief Complaint Chief Complaint  Patient presents with   Cough    HPI LIEUTENANT ABARCA is a 63 y.o. male.    Cough Here for cough and nasal congestion and rhinorrhea.  He has also had some mild sore throat and his symptoms felt some wheezing.  He feels a little short of breath when he is having a coughing spell.  No fever or chills.  No nausea vomiting or diarrhea.  He is allergic to lisinopril  and Lipitor.  No edema.  Past medical history includes diabetes (his sugars have been good lately and this morning was 109), coronary artery disease, hypertension  He does smoke tobacco  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   Anemia    Bladder atonia    BPH (benign prostatic hyperplasia)    CAD, NATIVE VESSEL 10/29/2009   Diabetes mellitus type 2, uncontrolled, with complications    Hyperlipidemia LDL goal <70    Hypertension    Kidney stone    Neurogenic bladder as late effect of cerebrovascular accident (CVA)    OBESITY 10/22/2009   Prostate cancer [facility])     Patient Active Problem List   Diagnosis Date Noted   History of CVA (cerebrovascular accident) 12/21/2022   Recurrent UTI 12/20/2022   Hydronephrosis, left 12/20/2022   Dyslipidemia 12/20/2022   Type 2 diabetes mellitus without complications (HCC) 12/20/2022   Essential hypertension 12/20/2022   Hematuria 12/20/2022   UTI (urinary tract infection) 12/05/2022   Staphylococcus epidermidis bacteremia 11/18/2022   Ureteral calculus, left 11/16/2022   Essential hypertension, benign 07/27/2022   Insulin  long-term use (HCC) 07/27/2022   Malignant neoplasm of prostate (HCC) 09/03/2019   Hydronephrosis with renal and ureteral calculus obstruction 06/24/2019   Thrombocytopenia (HCC) 06/21/2019   Normocytic anemia 06/21/2019   Bacteremia due to Klebsiella pneumoniae 06/21/2019   Lung infiltrate  06/20/2019   Sepsis due to urinary tract infection (HCC) 06/19/2019   Hyperkalemia 06/19/2019   Acute kidney injury (HCC) 06/19/2019   DM type 2 causing vascular disease (HCC) 04/13/2017   Diabetes mellitus without complication (HCC) 12/01/2016   Former smoker 11/02/2015   Diabetes type 2, uncontrolled 06/18/2015   Mixed hyperlipidemia 06/18/2015   BPH (benign prostatic hyperplasia) 06/18/2015   Self-catheterizes urinary bladder 06/18/2015   Hypertension associated with diabetes (HCC) 07/08/2010   CAD, NATIVE VESSEL 10/29/2009   CAD (coronary artery disease), native coronary artery 10/29/2009   Current smoker 10/22/2009   AMI, INFERIOR WALL 10/22/2009    Past Surgical History:  Procedure Laterality Date   BACK SURGERY     CORONARY STENT PLACEMENT     CYSTOSCOPY W/ URETERAL STENT PLACEMENT  02/26/2012   Procedure: CYSTOSCOPY WITH RETROGRADE PYELOGRAM/URETERAL STENT PLACEMENT;  Surgeon: Livingston Rigg, MD;  Location: [facility];  Service: Urology;  Laterality: Left;   CYSTOSCOPY W/ URETERAL STENT PLACEMENT Left 11/16/2022   Procedure: 1. Cystoscopy 2. left ureteral stent placement 3. left retrograde pyelography with interpretation;  Surgeon: Andrez Banker, MD;  Location: [facility] OR;  Service: Urology;  Laterality: Left;   CYSTOSCOPY WITH RETROGRADE PYELOGRAM, URETEROSCOPY AND STENT PLACEMENT Left 08/07/2019   Procedure: CYSTOSCOPY WITH LEFT RETROGRADE PYELOGRAM, URETEROSCOPY HOLMIUM LASER AND STENT PLACEMENT;  Surgeon: Andrez Banker, MD;  Location: [facility];  Service: Urology;  Laterality: Left;   CYSTOSCOPY WITH STENT PLACEMENT Left 06/24/2019  Procedure: CYSTOSCOPY WITH STENT PLACEMENT LEFT URETER WITH LEFT RETROGRADE URETERAL;  Surgeon: Adelbert Homans, MD;  Location: [facility];  Service: Urology;  Laterality: Left;   testicles Right        Home Medications    Prior to Admission medications   Medication Sig Start Date End Date Taking? Authorizing Provider  albuterol   (VENTOLIN  HFA) 108 (90 Base) MCG/ACT inhaler Inhale 2 puffs into the lungs every 4 (four) hours as needed for wheezing or shortness of breath. 07/29/23  Yes Ann Keto, MD  benzonatate  (TESSALON ) 100 MG capsule Take 1 capsule (100 mg total) by mouth 3 (three) times daily as needed for cough. 07/29/23  Yes Ann Keto, MD  doxycycline  (VIBRAMYCIN ) 100 MG capsule Take 1 capsule (100 mg total) by mouth 2 (two) times daily for 7 days. 07/29/23 08/05/23 Yes Mayford Alberg, Paige Boatman, MD  predniSONE  (DELTASONE ) 20 MG tablet Take 2 tablets (40 mg total) by mouth daily with breakfast for 5 days. 07/29/23 08/03/23 Yes Ann Keto, MD  Accu-Chek Softclix Lancets lancets Use as instructed to check blood glucose level by fingerstick twice per day. 08/15/22   Collins Dean, NP  Aspirin  81 MG CAPS Take 81 mg by mouth daily.    [provider]  Blood Glucose Monitoring Suppl (ACCU-CHEK GUIDE) w/Device KIT Use as instructed to check blood glucose level by fingerstick twice per day 04/26/21   Fleming, Zelda W, NP  dapagliflozin  propanediol (FARXIGA ) 10 MG TABS tablet Take 1 tablet (10 mg total) by mouth daily before breakfast. 03/27/23   Newlin, Enobong, MD  glucose blood (ACCU-CHEK GUIDE TEST) test strip Use to check blood sugar by fingerstick 2 (two) times daily. 05/10/23   Fleming, Zelda W, NP  insulin  glargine (LANTUS ) 100 UNIT/ML Solostar Pen Inject 40 Units into the skin 2 (two) times daily. 05/10/23   Fleming, Zelda W, NP  Insulin  Pen Needle (TECHLITE PLUS PEN NEEDLES) 32G X 4 MM MISC Inject as directed 2 (two) times daily. 06/19/23   Newlin, Enobong, MD  losartan  (COZAAR ) 25 MG tablet Take 1 tablet (25 mg total) by mouth daily. 03/27/23   Collins Dean, NP  Misc. Devices (CAREX COCCYX CUSHION) MISC Use daily when sitting for pressure relief ICD10 L98.429 07/02/20   Collins Dean, NP  Misc. Devices MISC Please provide patient with standard sized 56F urinary catheters for self cathing along with  lubricant. 05/29/18   Fleming, Zelda W, NP  multivitamin (ONE-A-DAY MEN'S) TABS tablet Take 1 tablet by mouth daily. 05/10/23   Fleming, Zelda W, NP  omega-3 acid ethyl esters (LOVAZA ) 1 g capsule Take 2 capsules (2 g total) by mouth 2 (two) times daily. Please fill as a 90 day supply 08/15/22   Fleming, Zelda W, NP  Semaglutide ,0.25 or 0.5MG /DOS, (OZEMPIC , 0.25 OR 0.5 MG/DOSE,) 2 MG/3ML SOPN Inject 0.5 mg into the skin once a week. 05/24/23   Newlin, Enobong, MD  gemfibrozil  (LOPID ) 600 MG tablet Take 1 tablet (600 mg total) by mouth 2 (two) times daily before a meal. 07/20/19 11/04/19  Collins Dean, NP    Family History Family History  Problem Relation Age of Onset   Hypertension Mother    Diabetes Father    Stomach cancer Maternal Uncle    Diabetes Paternal Aunt    Coronary artery disease Other    Heart attack Other    Breast cancer Neg Hx    Colon cancer Neg Hx    Pancreatic cancer Neg Hx  Prostate cancer Neg Hx    Esophageal cancer Neg Hx    Liver disease Neg Hx     Social History Social History   Tobacco Use   Smoking status: Every Day    Current packs/day: 0.50    Average packs/day: 0.5 packs/day for 40.0 years (20.0 ttl pk-yrs)    Types: E-cigarettes, Cigarettes   Smokeless tobacco: Former  Building services engineer status: Former  Substance Use Topics   Alcohol use: Not Currently    Comment: occ   Drug use: No     Allergies   Lipitor [atorvastatin ] and Lisinopril    Review of Systems Review of Systems  Respiratory:  Positive for cough.      Physical Exam Triage Vital Signs ED Triage Vitals  Encounter Vitals Group     BP 07/29/23 1238 107/73     Systolic BP Percentile --      Diastolic BP Percentile --      Pulse Rate 07/29/23 1238 94     Resp 07/29/23 1238 16     Temp 07/29/23 1238 98.2 F (36.8 C)     Temp Source 07/29/23 1238 Oral     SpO2 07/29/23 1238 94 %     Weight --      Height --      Head Circumference --      Peak Flow --      Pain Score  07/29/23 1241 0     Pain Loc --      Pain Education --      Exclude from Growth Chart --    No data found.  Updated Vital Signs BP 107/73 (BP Location: Left Arm)   Pulse 94   Temp 98.2 F (36.8 C) (Oral)   Resp 16   SpO2 94%   Visual Acuity Right Eye Distance:   Left Eye Distance:   Bilateral Distance:    Right Eye Near:   Left Eye Near:    Bilateral Near:     Physical Exam Vitals reviewed.  Constitutional:      General: He is not in acute distress.    Appearance: He is not ill-appearing, toxic-appearing or diaphoretic.  HENT:     Right Ear: Tympanic membrane and ear canal normal.     Left Ear: Tympanic membrane and ear canal normal.     Nose: Congestion present.     Mouth/Throat:     Mouth: Mucous membranes are moist.     Comments: White mucus is draining in the oropharynx. Eyes:     Extraocular Movements: Extraocular movements intact.     Conjunctiva/sclera: Conjunctivae normal.     Pupils: Pupils are equal, round, and reactive to light.  Cardiovascular:     Rate and Rhythm: Normal rate and regular rhythm.     Heart sounds: No murmur heard. Pulmonary:     Effort: No respiratory distress.     Breath sounds: No stridor. No rhonchi.     Comments: No wheezes at the time of exam.  There are some crackles heard in the lower lung bases.  Air movement is good. Musculoskeletal:     Cervical back: Neck supple.  Lymphadenopathy:     Cervical: No cervical adenopathy.  Skin:    Capillary Refill: Capillary refill takes less than 2 seconds.     Coloration: Skin is not jaundiced or pale.  Neurological:     General: No focal deficit present.     Mental Status: He is alert and oriented to  person, place, and time.  Psychiatric:        Behavior: Behavior normal.      UC Treatments / Results  Labs (all labs ordered are listed, but only abnormal results are displayed) Labs Reviewed - No data to display  EKG   Radiology DG Chest 2 View Result Date:  07/29/2023 CLINICAL DATA:  Cough, congestion, wheezing for 2-3 weeks EXAM: CHEST - 2 VIEW COMPARISON:  01/14/2023 FINDINGS: Frontal and lateral views of the chest demonstrate a stable cardiac silhouette. No acute airspace disease, effusion, or pneumothorax. No acute bony abnormalities. IMPRESSION: 1. Stable chest, no acute process. Electronically Signed   By: Bobbye Burrow M.D.   On: 07/29/2023 13:29    Procedures Procedures (including critical care time)  Medications Ordered in UC Medications - No data to display  Initial Impression / Assessment and Plan / UC Course  I have reviewed the triage vital signs and the nursing notes.  Pertinent labs & imaging results that were available during my care of the patient were reviewed by me and considered in my medical decision making (see chart for details).      Chest x-ray is negative.  Albuterol  and prednisone  are sent in for what I think is probably a COPD exacerbation, and doxycycline  is sent in for acute sinusitis due to the length of his symptoms.  Tessalon  Perles were sent in for the cough.  He does have a primary care to follow-up with. Final Clinical Impressions(s) / UC Diagnoses   Final diagnoses:  Wheezing  Acute sinusitis, recurrence not specified, unspecified location  COPD exacerbation [facility])     Discharge Instructions      Your chest x-ray is read as normal.  There is no sign of pneumonia or fluid.  Albuterol  inhaler--do 2 puffs every 4 hours as needed for shortness of breath or wheezing  Take prednisone  20 mg--2 daily for 5 days; this is for inflammation in your lungs  Take doxycycline  100 mg --1 capsule 2 times daily for 7 days; this is an antibiotic for possible sinusitis.  Take benzonatate  100 mg, 1 tab every 8 hours as needed for cough.     ED Prescriptions     Medication Sig Dispense Auth. Provider   doxycycline  (VIBRAMYCIN ) 100 MG capsule Take 1 capsule (100 mg total) by mouth 2 (two) times daily for 7  days. 14 capsule Kimara Bencomo K, MD   predniSONE  (DELTASONE ) 20 MG tablet Take 2 tablets (40 mg total) by mouth daily with breakfast for 5 days. 10 tablet Ann Keto, MD   benzonatate  (TESSALON ) 100 MG capsule Take 1 capsule (100 mg total) by mouth 3 (three) times daily as needed for cough. 21 capsule Ann Keto, MD   albuterol  (VENTOLIN  HFA) 108 (90 Base) MCG/ACT inhaler Inhale 2 puffs into the lungs every 4 (four) hours as needed for wheezing or shortness of breath. 1 each Ann Keto, MD      PDMP not reviewed this encounter.   Ann Keto, MD 07/29/23 1352

## 2023-07-29 NOTE — ED Triage Notes (Signed)
 Patient here today with c/o cough, nasal congestion, headache, and sneezing X 2-3 weeks. He has been taking Mucinex, Nyquil, and cough drops with some relief.

## 2023-08-01 ENCOUNTER — Other Ambulatory Visit: Payer: Self-pay

## 2023-08-10 ENCOUNTER — Other Ambulatory Visit: Payer: Self-pay

## 2023-08-11 ENCOUNTER — Ambulatory Visit: Admitting: Nurse Practitioner

## 2023-08-15 ENCOUNTER — Other Ambulatory Visit (HOSPITAL_COMMUNITY): Payer: Self-pay

## 2023-08-15 ENCOUNTER — Ambulatory Visit: Attending: Nurse Practitioner | Admitting: Nurse Practitioner

## 2023-08-15 ENCOUNTER — Other Ambulatory Visit: Payer: Self-pay

## 2023-08-15 ENCOUNTER — Encounter: Payer: Self-pay | Admitting: Nurse Practitioner

## 2023-08-15 VITALS — BP 117/77 | HR 86 | Resp 19 | Ht 72.0 in | Wt 231.4 lb

## 2023-08-15 DIAGNOSIS — I152 Hypertension secondary to endocrine disorders: Secondary | ICD-10-CM

## 2023-08-15 DIAGNOSIS — E1165 Type 2 diabetes mellitus with hyperglycemia: Secondary | ICD-10-CM | POA: Diagnosis not present

## 2023-08-15 DIAGNOSIS — Z789 Other specified health status: Secondary | ICD-10-CM

## 2023-08-15 DIAGNOSIS — Z794 Long term (current) use of insulin: Secondary | ICD-10-CM

## 2023-08-15 DIAGNOSIS — E1159 Type 2 diabetes mellitus with other circulatory complications: Secondary | ICD-10-CM

## 2023-08-15 DIAGNOSIS — E785 Hyperlipidemia, unspecified: Secondary | ICD-10-CM

## 2023-08-15 DIAGNOSIS — E1169 Type 2 diabetes mellitus with other specified complication: Secondary | ICD-10-CM | POA: Diagnosis not present

## 2023-08-15 DIAGNOSIS — F17200 Nicotine dependence, unspecified, uncomplicated: Secondary | ICD-10-CM

## 2023-08-15 MED ORDER — EZETIMIBE 10 MG PO TABS
10.0000 mg | ORAL_TABLET | Freq: Every day | ORAL | 3 refills | Status: DC
  Filled 2023-08-15: qty 90, 90d supply, fill #0

## 2023-08-15 MED ORDER — LOSARTAN POTASSIUM 25 MG PO TABS
25.0000 mg | ORAL_TABLET | Freq: Every day | ORAL | 1 refills | Status: AC
  Filled 2023-08-15 – 2023-10-17 (×3): qty 90, 90d supply, fill #0
  Filled 2024-01-11: qty 90, 90d supply, fill #1

## 2023-08-15 MED ORDER — DAPAGLIFLOZIN PROPANEDIOL 10 MG PO TABS
10.0000 mg | ORAL_TABLET | Freq: Every day | ORAL | 1 refills | Status: AC
  Filled 2023-08-15 – 2023-10-17 (×3): qty 90, 90d supply, fill #0
  Filled 2024-01-11: qty 90, 90d supply, fill #1

## 2023-08-15 MED ORDER — SEMAGLUTIDE (1 MG/DOSE) 4 MG/3ML ~~LOC~~ SOPN
1.0000 mg | PEN_INJECTOR | SUBCUTANEOUS | 6 refills | Status: DC
  Filled 2023-08-15: qty 3, 28d supply, fill #0

## 2023-08-15 MED ORDER — OMEGA-3-ACID ETHYL ESTERS 1 G PO CAPS
2.0000 | ORAL_CAPSULE | Freq: Two times a day (BID) | ORAL | 3 refills | Status: AC
Start: 2023-08-15 — End: ?
  Filled 2023-08-15 – 2023-08-21 (×2): qty 360, 90d supply, fill #0

## 2023-08-15 NOTE — Progress Notes (Signed)
 Assessment & Plan:  Shane Bond was seen today for hypertension.  Diagnoses and all orders for this visit:  Hypertension associated with diabetes (HCC) -     losartan  (COZAAR ) 25 MG tablet; Take 1 tablet (25 mg total) by mouth daily. Continue all antihypertensives as prescribed.  Reminded to bring in blood pressure log for follow  up appointment.  RECOMMENDATIONS: DASH/Mediterranean Diets are healthier choices for HTN.    Type 2 diabetes mellitus with hyperglycemia, with long-term current use of insulin  ( DOSE CHANGE> INCREASE OZEMPIC  DOSE -     dapagliflozin  propanediol (FARXIGA ) 10 MG TABS tablet; Take 1 tablet (10 mg total) by mouth daily before breakfast. -     Hemoglobin A1c -     Semaglutide , 1 MG/DOSE, 4 MG/3ML SOPN; Inject 1 mg as directed once a week.  Hyperlipidemia associated with type 2 diabetes mellitus (HCC) -     omega-3 acid ethyl esters (LOVAZA ) 1 g capsule; Take 2 capsules (2 g total) by mouth 2 (two) times daily. Please fill as a 90 day supply -     ezetimibe  (ZETIA ) 10 MG tablet; Take 1 tablet (10 mg total) by mouth daily. For cholesterol. Add to omega 3  Tobacco dependence -     CT CHEST LUNG CA SCREEN LOW DOSE W/O CM; Future  Statin intolerance -     omega-3 acid ethyl esters (LOVAZA ) 1 g capsule; Take 2 capsules (2 g total) by mouth 2 (two) times daily. Please fill as a 90 day supply -     ezetimibe  (ZETIA ) 10 MG tablet; Take 1 tablet (10 mg total) by mouth daily. For cholesterol. Add to omega 3    Patient has been counseled on age-appropriate routine health concerns for screening and prevention. These are reviewed and up-to-date. Referrals have been placed accordingly. Immunizations are up-to-date or declined.    Subjective:   Chief Complaint  Patient presents with   Hypertension    Shane Bond 63 y.o. male presents to office today for follow up to HTN  PMHx significant for HTN, HLD, CAD with MI (inferior wall), CVA c/b neurogenic bladder with  frequent UTI and nephrolithiasis (utilizes [facility]IC), T2DM, BPH, prostate CA.    HTN Blood pressure is well controlled.  He is currently prescribed losartan  25 mg daily.  BP Readings from Last 3 Encounters:  08/15/23 117/77  07/29/23 107/73  06/14/23 108/67    A1c at goal <7. Reports morning readings 140s. Lab Results  Component Value Date   HGBA1C 6.8 (A) 05/10/2023    HYPERLIPIDEMIA LDL not at goal. Will add zetia  as he is statin intolerant Lab Results  Component Value Date   LDLCALC 132 (H) 05/10/2023    Review of Systems  Constitutional:  Negative for fever, malaise/fatigue and weight loss.  HENT: Negative.  Negative for nosebleeds.   Eyes: Negative.  Negative for blurred vision, double vision and photophobia.  Respiratory: Negative.  Negative for cough and shortness of breath.   Cardiovascular: Negative.  Negative for chest pain, palpitations and leg swelling.  Gastrointestinal: Negative.  Negative for heartburn, nausea and vomiting.  Genitourinary:        Neurogenic bladder  Musculoskeletal: Negative.  Negative for myalgias.  Neurological: Negative.  Negative for dizziness, focal weakness, seizures and headaches.  Psychiatric/Behavioral: Negative.  Negative for suicidal ideas.     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   Anemia    Bladder atonia    BPH (benign prostatic hyperplasia)  CAD, NATIVE VESSEL 10/29/2009   Diabetes mellitus type 2, uncontrolled, with complications    Hyperlipidemia LDL goal <70    Hypertension    Kidney stone    Neurogenic bladder as late effect of cerebrovascular accident (CVA)    OBESITY 10/22/2009   Prostate cancer [facility])     Past Surgical History:  Procedure Laterality Date   BACK SURGERY     CORONARY STENT PLACEMENT     CYSTOSCOPY W/ URETERAL STENT PLACEMENT  02/26/2012   Procedure: CYSTOSCOPY WITH RETROGRADE PYELOGRAM/URETERAL STENT PLACEMENT;  Surgeon: Alm GORMAN Fragmin, MD;  Location: [facility];  Service:  Urology;  Laterality: Left;   CYSTOSCOPY W/ URETERAL STENT PLACEMENT Left 11/16/2022   Procedure: 1. Cystoscopy 2. left ureteral stent placement 3. left retrograde pyelography with interpretation;  Surgeon: Cam Morene ORN, MD;  Location: [facility] OR;  Service: Urology;  Laterality: Left;   CYSTOSCOPY WITH RETROGRADE PYELOGRAM, URETEROSCOPY AND STENT PLACEMENT Left 08/07/2019   Procedure: CYSTOSCOPY WITH LEFT RETROGRADE PYELOGRAM, URETEROSCOPY HOLMIUM LASER AND STENT PLACEMENT;  Surgeon: Cam Morene ORN, MD;  Location[facility];  Service: Urology;  Laterality: Left;   CYSTOSCOPY WITH STENT PLACEMENT Left 06/24/2019   Procedure: CYSTOSCOPY WITH STENT PLACEMENT LEFT URETER WITH LEFT RETROGRADE URETERAL;  Surgeon: Devere Lonni Righter, MD;  Location: [facility];  Service: Urology;  Laterality: Left;   testicles Right     Family History  Problem Relation Age of Onset   Hypertension Mother    Diabetes Father    Stomach cancer Maternal Uncle    Diabetes Paternal Aunt    Coronary artery disease Other    Heart attack Other    Breast cancer Neg Hx    Colon cancer Neg Hx    Pancreatic cancer Neg Hx    Prostate cancer Neg Hx    Esophageal cancer Neg Hx    Liver disease Neg Hx     Social History Reviewed with no changes to be made today.   Outpatient Medications Prior to Visit  Medication Sig Dispense Refill   Accu-Chek Softclix Lancets lancets Use as instructed to check blood glucose level by fingerstick twice per day. 100 each 12   albuterol  (VENTOLIN  HFA) 108 (90 Base) MCG/ACT inhaler Inhale 2 puffs into the lungs every 4 (four) hours as needed for wheezing or shortness of breath. 1 each 0   Aspirin  81 MG CAPS Take 81 mg by mouth daily.     Blood Glucose Monitoring Suppl (ACCU-CHEK GUIDE) w/Device KIT Use as instructed to check blood glucose level by fingerstick twice per day 1 kit 0   glucose blood (ACCU-CHEK GUIDE TEST) test strip Use to check blood sugar by fingerstick 2 (two) times daily.  200 each 6   insulin  glargine (LANTUS ) 100 UNIT/ML Solostar Pen Inject 40 Units into the skin 2 (two) times daily. 45 mL 6   Insulin  Pen Needle (TECHLITE PLUS PEN NEEDLES) 32G X 4 MM MISC Inject as directed 2 (two) times daily. 200 each 6   Misc. Devices (CAREX COCCYX CUSHION) MISC Use daily when sitting for pressure relief ICD10 L98.429 1 each prn   Misc. Devices MISC Please provide patient with standard sized 65F urinary catheters for self cathing along with lubricant. 50 each prn   multivitamin (ONE-A-DAY MEN'S) TABS tablet Take 1 tablet by mouth daily. 90 tablet 3   dapagliflozin  propanediol (FARXIGA ) 10 MG TABS tablet Take 1 tablet (10 mg total) by mouth daily before breakfast. 90 tablet 1   losartan  (COZAAR ) 25 MG  tablet Take 1 tablet (25 mg total) by mouth daily. 90 tablet 1   omega-3 acid ethyl esters (LOVAZA ) 1 g capsule Take 2 capsules (2 g total) by mouth 2 (two) times daily. Please fill as a 90 day supply 360 capsule 3   Semaglutide ,0.25 or 0.5MG /DOS, (OZEMPIC , 0.25 OR 0.5 MG/DOSE,) 2 MG/3ML SOPN Inject 0.5 mg into the skin once a week. 9 mL 1   benzonatate  (TESSALON ) 100 MG capsule Take 1 capsule (100 mg total) by mouth 3 (three) times daily as needed for cough. (Patient not taking: Reported on 08/15/2023) 21 capsule 0   No facility-administered medications prior to visit.    Allergies  Allergen Reactions   Lipitor [Atorvastatin ] Rash   Lisinopril  Rash       Objective:    BP 117/77 (BP Location: Left Arm, Patient Position: Sitting, Cuff Size: Normal)   Pulse 86   Resp 19   Ht 6' (1.829 m)   Wt 231 lb 6.4 oz (105 kg)   SpO2 100%   BMI 31.38 kg/m  Wt Readings from Last 3 Encounters:  08/15/23 231 lb 6.4 oz (105 kg)  06/13/23 245 lb (111.1 kg)  05/10/23 231 lb 3.2 oz (104.9 kg)    Physical Exam Vitals and nursing note reviewed.  Constitutional:      Appearance: He is well-developed.  HENT:     Head: Normocephalic and atraumatic.   Cardiovascular:     Rate and  Rhythm: Normal rate and regular rhythm.     Heart sounds: Normal heart sounds. No murmur heard.    No friction rub. No gallop.  Pulmonary:     Effort: Pulmonary effort is normal. No tachypnea or respiratory distress.     Breath sounds: Normal breath sounds. No decreased breath sounds, wheezing, rhonchi or rales.  Chest:     Chest wall: No tenderness.  Abdominal:     General: Bowel sounds are normal.     Palpations: Abdomen is soft.   Musculoskeletal:        General: Normal range of motion.     Cervical back: Normal range of motion.   Skin:    General: Skin is warm and dry.   Neurological:     Mental Status: He is alert and oriented to person, place, and time.     Coordination: Coordination normal.   Psychiatric:        Behavior: Behavior normal. Behavior is cooperative.        Thought Content: Thought content normal.        Judgment: Judgment normal.          Patient has been counseled extensively about nutrition and exercise as well as the importance of adherence with medications and regular follow-up. The patient was given clear instructions to go to ER or return to medical center if symptoms don't improve, worsen or new problems develop. The patient verbalized understanding.   Follow-up: Return in about 3 months (around 11/15/2023).   Haze LELON Servant, FNP-BC [facility] [place], [place] [phone]   08/15/2023, 3:25 PM

## 2023-08-15 NOTE — Patient Instructions (Addendum)
 [facility] [address]  [phone]

## 2023-08-16 ENCOUNTER — Ambulatory Visit: Payer: Self-pay | Admitting: Nurse Practitioner

## 2023-08-16 LAB — HEMOGLOBIN A1C
Est. average glucose Bld gHb Est-mCnc: 160 mg/dL
Hgb A1c MFr Bld: 7.2 % — ABNORMAL HIGH (ref 4.8–5.6)

## 2023-08-18 ENCOUNTER — Encounter: Payer: Self-pay | Admitting: Nurse Practitioner

## 2023-08-21 ENCOUNTER — Other Ambulatory Visit (HOSPITAL_COMMUNITY): Payer: Self-pay

## 2023-08-22 ENCOUNTER — Other Ambulatory Visit

## 2023-08-22 ENCOUNTER — Inpatient Hospital Stay: Admission: RE | Admit: 2023-08-22 | Source: Ambulatory Visit

## 2023-09-11 ENCOUNTER — Other Ambulatory Visit: Payer: Self-pay

## 2023-09-11 ENCOUNTER — Other Ambulatory Visit (HOSPITAL_COMMUNITY): Payer: Self-pay

## 2023-09-11 MED ORDER — SULFAMETHOXAZOLE-TRIMETHOPRIM 800-160 MG PO TABS
1.0000 | ORAL_TABLET | Freq: Two times a day (BID) | ORAL | 0 refills | Status: DC
  Filled 2023-09-11: qty 20, 10d supply, fill #0

## 2023-09-12 ENCOUNTER — Other Ambulatory Visit: Payer: Self-pay

## 2023-09-12 ENCOUNTER — Other Ambulatory Visit (HOSPITAL_COMMUNITY): Payer: Self-pay

## 2023-09-14 ENCOUNTER — Other Ambulatory Visit: Payer: Self-pay

## 2023-09-19 ENCOUNTER — Other Ambulatory Visit (HOSPITAL_COMMUNITY): Payer: Self-pay

## 2023-09-20 ENCOUNTER — Other Ambulatory Visit (HOSPITAL_COMMUNITY): Payer: Self-pay

## 2023-09-22 ENCOUNTER — Other Ambulatory Visit: Payer: Self-pay | Admitting: Nurse Practitioner

## 2023-09-22 ENCOUNTER — Other Ambulatory Visit (HOSPITAL_COMMUNITY): Payer: Self-pay

## 2023-09-22 ENCOUNTER — Other Ambulatory Visit: Payer: Self-pay

## 2023-09-22 DIAGNOSIS — E1165 Type 2 diabetes mellitus with hyperglycemia: Secondary | ICD-10-CM

## 2023-09-22 MED ORDER — ACCU-CHEK SOFTCLIX LANCETS MISC
12 refills | Status: AC
  Filled 2023-09-22: qty 100, 50d supply, fill #0
  Filled 2023-12-14: qty 100, 50d supply, fill #1
  Filled 2024-02-08 (×2): qty 100, 50d supply, fill #2
  Filled 2024-03-27: qty 100, 50d supply, fill #3
  Filled 2024-03-27: qty 200, 100d supply, fill #3

## 2023-09-25 ENCOUNTER — Other Ambulatory Visit: Payer: Self-pay

## 2023-09-26 ENCOUNTER — Other Ambulatory Visit (HOSPITAL_COMMUNITY): Payer: Self-pay

## 2023-10-02 ENCOUNTER — Other Ambulatory Visit: Payer: Self-pay

## 2023-10-09 ENCOUNTER — Other Ambulatory Visit: Payer: Self-pay

## 2023-10-09 ENCOUNTER — Other Ambulatory Visit (HOSPITAL_COMMUNITY): Payer: Self-pay

## 2023-10-09 MED ORDER — SULFAMETHOXAZOLE-TRIMETHOPRIM 800-160 MG PO TABS
1.0000 | ORAL_TABLET | Freq: Two times a day (BID) | ORAL | 0 refills | Status: DC
  Filled 2023-10-09: qty 20, 10d supply, fill #0

## 2023-10-17 ENCOUNTER — Other Ambulatory Visit (HOSPITAL_COMMUNITY): Payer: Self-pay

## 2023-10-17 ENCOUNTER — Other Ambulatory Visit: Payer: Self-pay

## 2023-10-26 ENCOUNTER — Other Ambulatory Visit: Payer: Self-pay

## 2023-10-30 ENCOUNTER — Other Ambulatory Visit: Payer: Self-pay | Admitting: Nurse Practitioner

## 2023-10-30 ENCOUNTER — Other Ambulatory Visit: Payer: Self-pay

## 2023-10-30 DIAGNOSIS — E1165 Type 2 diabetes mellitus with hyperglycemia: Secondary | ICD-10-CM

## 2023-10-30 MED ORDER — INSULIN GLARGINE 100 UNIT/ML SOLOSTAR PEN
40.0000 [IU] | PEN_INJECTOR | Freq: Two times a day (BID) | SUBCUTANEOUS | 6 refills | Status: AC
  Filled 2023-10-30 – 2023-11-06 (×3): qty 45, 56d supply, fill #0
  Filled 2024-01-11: qty 45, 56d supply, fill #1
  Filled 2024-03-06: qty 45, 56d supply, fill #2

## 2023-11-06 ENCOUNTER — Other Ambulatory Visit (HOSPITAL_COMMUNITY): Payer: Self-pay

## 2023-11-06 ENCOUNTER — Other Ambulatory Visit: Payer: Self-pay

## 2023-11-15 ENCOUNTER — Other Ambulatory Visit: Payer: Self-pay

## 2023-11-15 ENCOUNTER — Other Ambulatory Visit (HOSPITAL_COMMUNITY): Payer: Self-pay

## 2023-11-15 ENCOUNTER — Ambulatory Visit: Attending: Nurse Practitioner | Admitting: Nurse Practitioner

## 2023-11-15 ENCOUNTER — Encounter: Payer: Self-pay | Admitting: Nurse Practitioner

## 2023-11-15 VITALS — BP 112/74 | HR 78 | Resp 19 | Ht 72.0 in | Wt 234.0 lb

## 2023-11-15 DIAGNOSIS — Z23 Encounter for immunization: Secondary | ICD-10-CM

## 2023-11-15 DIAGNOSIS — E1165 Type 2 diabetes mellitus with hyperglycemia: Secondary | ICD-10-CM

## 2023-11-15 DIAGNOSIS — N390 Urinary tract infection, site not specified: Secondary | ICD-10-CM

## 2023-11-15 DIAGNOSIS — D696 Thrombocytopenia, unspecified: Secondary | ICD-10-CM | POA: Diagnosis not present

## 2023-11-15 DIAGNOSIS — Z794 Long term (current) use of insulin: Secondary | ICD-10-CM

## 2023-11-15 DIAGNOSIS — E1169 Type 2 diabetes mellitus with other specified complication: Secondary | ICD-10-CM

## 2023-11-15 DIAGNOSIS — Z789 Other specified health status: Secondary | ICD-10-CM | POA: Diagnosis not present

## 2023-11-15 DIAGNOSIS — I10 Essential (primary) hypertension: Secondary | ICD-10-CM

## 2023-11-15 DIAGNOSIS — E785 Hyperlipidemia, unspecified: Secondary | ICD-10-CM

## 2023-11-15 LAB — POCT GLYCOSYLATED HEMOGLOBIN (HGB A1C): Hemoglobin A1C: 7.3 % — AB (ref 4.0–5.6)

## 2023-11-15 MED ORDER — SEMAGLUTIDE (1 MG/DOSE) 4 MG/3ML ~~LOC~~ SOPN
1.0000 mg | PEN_INJECTOR | SUBCUTANEOUS | 6 refills | Status: AC
  Filled 2023-11-15 (×2): qty 3, 28d supply, fill #0
  Filled 2024-01-11: qty 3, 28d supply, fill #1
  Filled 2024-03-06: qty 3, 28d supply, fill #2
  Filled 2024-03-27 (×2): qty 3, 28d supply, fill #3

## 2023-11-15 MED ORDER — EZETIMIBE 10 MG PO TABS
10.0000 mg | ORAL_TABLET | Freq: Every day | ORAL | 3 refills | Status: AC
  Filled 2023-11-15 – 2024-02-08 (×2): qty 90, 90d supply, fill #0
  Filled 2024-02-08: qty 90, 90d supply, fill #1

## 2023-11-15 NOTE — Progress Notes (Unsigned)
 A week of congestion, cough, runny nose, sneezing. Allergies.

## 2023-11-15 NOTE — Progress Notes (Unsigned)
 Assessment & Plan:  Shane Bond was seen today for diabetes and hypertension.  Diagnoses and all orders for this visit:  Type 2 diabetes mellitus with hyperglycemia, with long-term current use of insulin  (HCC) -     POCT glycosylated hemoglobin (Hb A1C) -     Semaglutide , 1 MG/DOSE, 4 MG/3ML SOPN; Inject 1 mg as directed once a week. -     CMP14+EGFR - Instructed to resume weekly Ozempic  to help lower A1c.  Need for influenza vaccination -     Flu vaccine trivalent PF, 6mos and older(Flulaval,Afluria,Fluarix,Fluzone)  Hyperlipidemia associated with type 2 diabetes mellitus (HCC) -     ezetimibe  (ZETIA ) 10 MG tablet; Take 1 tablet (10 mg total) by mouth daily. For cholesterol. Add to omega 3  Statin intolerance -     ezetimibe  (ZETIA ) 10 MG tablet; Take 1 tablet (10 mg total) by mouth daily. For cholesterol. Add to omega 3  Thrombocytopenia -     CBC with Differential  Urinary tract infection without hematuria, site unspecified -     Urinalysis, Complete -     Urine Culture; Future -     Urine Culture -     Microscopic Examination Previous urine sample showed significant bacteriuria. Occasional dysuria reported. - Obtain urine sample to check for persistent bacteriuria.   Primary hypertension -     CMP14+EGFR Continue all antihypertensives as prescribed.  Reminded to bring in blood pressure log for follow  up appointment.  RECOMMENDATIONS: DASH/Mediterranean Diets are healthier choices for HTN.      Patient has been counseled on age-appropriate routine health concerns for screening and prevention. These are reviewed and up-to-date. Referrals have been placed accordingly. Immunizations are up-to-date or declined.    Subjective:   Chief Complaint  Patient presents with   Diabetes   Hypertension    Shane Bond 63 y.o. male presents to office today    BP Readings from Last 3 Encounters:  11/15/23 112/74  08/15/23 117/77  07/29/23 107/73     Lab Results   Component Value Date   HGBA1C 7.3 (A) 11/15/2023    History of Present Illness Shane Bond is a 63 year old male with diabetes who presents for follow-up and medication management. Shane Bond PMHx significant for HTN, HLD, CAD with MI (inferior wall), CVA c/b neurogenic bladder with frequent UTI and nephrolithiasis (utilizes [facility]), T2DM, BPH, prostate CA.      DM 2 He has difficulty remembering to take his Ozempic , prescribed at 1 mg once weekly, and last took it two to three weeks ago. Weight is gradually trending down from 245 pounds in April to 234 pounds currently. He reports no gastrointestinal side effects from Ozempic , such as nausea, stomach pain, or constipation. He has a history of elevated A1c, with the last recorded level being 7.3 a few months ago. Lab Results  Component Value Date   HGBA1C 7.3 (A) 11/15/2023     He experiences  congestion, coughing, sneezing, and sniffles, persisting for about a week. He has not taken any allergy medication and denies fever. He attributes these symptoms to allergies, which he and his sister frequently experience. The congestion and coughing can sometimes last for three to four weeks.  He missed his scheduled CT lung cancer screening due to transportation issues, as his ride did not show up. He lives with friends, a married couple, and has known them for many years. He sometimes misses medical appointments due to unreliable transportation.  HTN Blood pressure at  goal with losartan  25 mg daily.  BP Readings from Last 3 Encounters:  11/15/23 112/74  08/15/23 117/77  07/29/23 107/73    Review of Systems  Constitutional:  Negative for fever, malaise/fatigue and weight loss.  HENT: Negative.  Negative for nosebleeds.   Eyes: Negative.  Negative for blurred vision, double vision and photophobia.  Respiratory: Negative.  Negative for cough and shortness of breath.   Cardiovascular: Negative.  Negative for chest pain, palpitations and leg swelling.   Gastrointestinal: Negative.  Negative for heartburn, nausea and vomiting.  Genitourinary:  Positive for dysuria.  Musculoskeletal: Negative.  Negative for myalgias.  Neurological: Negative.  Negative for dizziness, focal weakness, seizures and headaches.  Psychiatric/Behavioral: Negative.  Negative for suicidal ideas.     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   Anemia    Bladder atonia    BPH (benign prostatic hyperplasia)    CAD, NATIVE VESSEL 10/29/2009   Diabetes mellitus type 2, uncontrolled, with complications    Hyperlipidemia LDL goal <70    Hypertension    Kidney stone    Neurogenic bladder as late effect of cerebrovascular accident (CVA)    OBESITY 10/22/2009   Prostate cancer [facility])     Past Surgical History:  Procedure Laterality Date   BACK SURGERY     CORONARY STENT PLACEMENT     CYSTOSCOPY W/ URETERAL STENT PLACEMENT  02/26/2012   Procedure: CYSTOSCOPY WITH RETROGRADE PYELOGRAM/URETERAL STENT PLACEMENT;  Surgeon: Alm GORMAN Fragmin, MD;  Location: [facility];  Service: Urology;  Laterality: Left;   CYSTOSCOPY W/ URETERAL STENT PLACEMENT Left 11/16/2022   Procedure: 1. Cystoscopy 2. left ureteral stent placement 3. left retrograde pyelography with interpretation;  Surgeon: Cam Morene ORN, MD;  Location: [facility] OR;  Service: Urology;  Laterality: Left;   CYSTOSCOPY WITH RETROGRADE PYELOGRAM, URETEROSCOPY AND STENT PLACEMENT Left 08/07/2019   Procedure: CYSTOSCOPY WITH LEFT RETROGRADE PYELOGRAM, URETEROSCOPY HOLMIUM LASER AND STENT PLACEMENT;  Surgeon: Cam Morene ORN, MD;  Location: [facility];  Service: Urology;  Laterality: Left;   CYSTOSCOPY WITH STENT PLACEMENT Left 06/24/2019   Procedure: CYSTOSCOPY WITH STENT PLACEMENT LEFT URETER WITH LEFT RETROGRADE URETERAL;  Surgeon: Devere Lonni Righter, MD;  Location: [facility];  Service: Urology;  Laterality: Left;   testicles Right     Family History  Problem Relation Age of Onset   Hypertension Mother     Diabetes Father    Stomach cancer Maternal Uncle    Diabetes Paternal Aunt    Coronary artery disease Other    Heart attack Other    Breast cancer Neg Hx    Colon cancer Neg Hx    Pancreatic cancer Neg Hx    Prostate cancer Neg Hx    Esophageal cancer Neg Hx    Liver disease Neg Hx     Social History Reviewed with no changes to be made today.   Outpatient Medications Prior to Visit  Medication Sig Dispense Refill   Accu-Chek Softclix Lancets lancets Use as instructed to check blood glucose level by fingerstick twice per day. 100 each 12   albuterol  (VENTOLIN  HFA) 108 (90 Base) MCG/ACT inhaler Inhale 2 puffs into the lungs every 4 (four) hours as needed for wheezing or shortness of breath. 1 each 0   Aspirin  81 MG CAPS Take 81 mg by mouth daily.     Blood Glucose Monitoring Suppl (ACCU-CHEK GUIDE) w/Device KIT Use as instructed to check blood glucose level by fingerstick twice per day 1  kit 0   dapagliflozin  propanediol (FARXIGA ) 10 MG TABS tablet Take 1 tablet (10 mg total) by mouth daily before breakfast. 90 tablet 1   glucose blood (ACCU-CHEK GUIDE TEST) test strip Use to check blood sugar by fingerstick 2 (two) times daily. 200 each 6   insulin  glargine (LANTUS ) 100 UNIT/ML Solostar Pen Inject 40 Units into the skin 2 (two) times daily. 45 mL 6   Insulin  Pen Needle (TECHLITE PLUS PEN NEEDLES) 32G X 4 MM MISC Inject as directed 2 (two) times daily. 200 each 6   losartan  (COZAAR ) 25 MG tablet Take 1 tablet (25 mg total) by mouth daily. 90 tablet 1   Misc. Devices (CAREX COCCYX CUSHION) MISC Use daily when sitting for pressure relief ICD10 L98.429 1 each prn   Misc. Devices MISC Please provide patient with standard sized 72F urinary catheters for self cathing along with lubricant. 50 each prn   omega-3 acid ethyl esters (LOVAZA ) 1 g capsule Take 2 capsules (2 g total) by mouth 2 (two) times daily. Please fill as a 90 day supply 360 capsule 3   benzonatate  (TESSALON ) 100 MG capsule  Take 1 capsule (100 mg total) by mouth 3 (three) times daily as needed for cough. (Patient not taking: Reported on 11/15/2023) 21 capsule 0   multivitamin (ONE-A-DAY MEN'S) TABS tablet Take 1 tablet by mouth daily. (Patient not taking: Reported on 11/15/2023) 90 tablet 3   ezetimibe  (ZETIA ) 10 MG tablet Take 1 tablet (10 mg total) by mouth daily. For cholesterol. Add to omega 3 (Patient not taking: Reported on 11/15/2023) 90 tablet 3   Semaglutide , 1 MG/DOSE, 4 MG/3ML SOPN Inject 1 mg as directed once a week. (Patient not taking: Reported on 11/15/2023) 3 mL 6   sulfamethoxazole -trimethoprim  (BACTRIM  DS) 800-160 MG tablet Take 1 tablet by mouth every 12 (twelve) hours. (Patient not taking: Reported on 11/15/2023) 20 tablet 0   No facility-administered medications prior to visit.    Allergies  Allergen Reactions   Lipitor [Atorvastatin ] Rash   Lisinopril  Rash       Objective:    BP 112/74 (BP Location: Left Arm, Patient Position: Sitting, Cuff Size: Normal)   Pulse 78   Resp 19   Ht 6' (1.829 m)   Wt 234 lb (106.1 kg)   SpO2 98%   BMI 31.74 kg/m  Wt Readings from Last 3 Encounters:  11/15/23 234 lb (106.1 kg)  08/15/23 231 lb 6.4 oz (105 kg)  06/13/23 245 lb (111.1 kg)    Physical Exam Vitals and nursing note reviewed.  Constitutional:      Appearance: He is well-developed.  HENT:     Head: Normocephalic and atraumatic.     Mouth/Throat:     Dentition: Abnormal dentition. Dental caries present.  Cardiovascular:     Rate and Rhythm: Normal rate and regular rhythm.     Heart sounds: Normal heart sounds. No murmur heard.    No friction rub. No gallop.  Pulmonary:     Effort: Pulmonary effort is normal. No tachypnea or respiratory distress.     Breath sounds: Normal breath sounds. No decreased breath sounds, wheezing, rhonchi or rales.  Chest:     Chest wall: No tenderness.  Musculoskeletal:        General: Normal range of motion.     Cervical back: Normal range of motion.   Skin:    General: Skin is warm and dry.  Neurological:     Mental Status: He is alert and oriented  to person, place, and time.     Coordination: Coordination normal.  Psychiatric:        Behavior: Behavior normal. Behavior is cooperative.        Thought Content: Thought content normal.        Judgment: Judgment normal.          Patient has been counseled extensively about nutrition and exercise as well as the importance of adherence with medications and regular follow-up. The patient was given clear instructions to go to ER or return to medical center if symptoms don't improve, worsen or new problems develop. The patient verbalized understanding.   Follow-up: Return in about 3 months (around 02/23/2024) for a1c, HTN.   Haze LELON Servant, FNP-BC [facility] [place], [place] [phone]   11/16/2023, 8:35 PM

## 2023-11-15 NOTE — Patient Instructions (Signed)
 [facility] [address]  [phone]

## 2023-11-16 ENCOUNTER — Ambulatory Visit: Payer: Self-pay | Admitting: Nurse Practitioner

## 2023-11-16 LAB — CMP14+EGFR
ALT: 25 IU/L (ref 0–44)
AST: 21 IU/L (ref 0–40)
Albumin: 4.3 g/dL (ref 3.9–4.9)
Alkaline Phosphatase: 121 IU/L (ref 47–123)
BUN/Creatinine Ratio: 14 (ref 10–24)
BUN: 14 mg/dL (ref 8–27)
Bilirubin Total: 0.3 mg/dL (ref 0.0–1.2)
CO2: 20 mmol/L (ref 20–29)
Calcium: 9.9 mg/dL (ref 8.6–10.2)
Chloride: 102 mmol/L (ref 96–106)
Creatinine, Ser: 1.02 mg/dL (ref 0.76–1.27)
Globulin, Total: 3.3 g/dL (ref 1.5–4.5)
Glucose: 124 mg/dL — ABNORMAL HIGH (ref 70–99)
Potassium: 4.6 mmol/L (ref 3.5–5.2)
Sodium: 139 mmol/L (ref 134–144)
Total Protein: 7.6 g/dL (ref 6.0–8.5)
eGFR: 83 mL/min/1.73 (ref >59)

## 2023-11-16 LAB — URINALYSIS, COMPLETE
Bilirubin, UA: NEGATIVE
Ketones, UA: NEGATIVE
Nitrite, UA: NEGATIVE
Specific Gravity, UA: 1.029 (ref 1.005–1.030)
Urobilinogen, Ur: 1 mg/dL (ref 0.2–1.0)
pH, UA: 6.5 (ref 5.0–7.5)

## 2023-11-16 LAB — CBC WITH DIFFERENTIAL/PLATELET
Basophils Absolute: 0.1 x10E3/uL (ref 0.0–0.2)
Basos: 1 %
EOS (ABSOLUTE): 0.2 x10E3/uL (ref 0.0–0.4)
Eos: 3 %
Hematocrit: 45.5 % (ref 37.5–51.0)
Hemoglobin: 15.2 g/dL (ref 13.0–17.7)
Immature Grans (Abs): 0 x10E3/uL (ref 0.0–0.1)
Immature Granulocytes: 0 %
Lymphocytes Absolute: 1.6 x10E3/uL (ref 0.7–3.1)
Lymphs: 20 %
MCH: 32.5 pg (ref 26.6–33.0)
MCHC: 33.4 g/dL (ref 31.5–35.7)
MCV: 97 fL (ref 79–97)
Monocytes Absolute: 0.8 x10E3/uL (ref 0.1–0.9)
Monocytes: 9 %
Neutrophils Absolute: 5.5 x10E3/uL (ref 1.4–7.0)
Neutrophils: 67 %
Platelets: 237 x10E3/uL (ref 150–450)
RBC: 4.68 x10E6/uL (ref 4.14–5.80)
RDW: 13 % (ref 11.6–15.4)
WBC: 8.1 x10E3/uL (ref 3.4–10.8)

## 2023-11-16 LAB — MICROSCOPIC EXAMINATION
Casts: NONE SEEN /LPF
Epithelial Cells (non renal): NONE SEEN /HPF (ref 0–10)
WBC, UA: >30 /HPF — AB (ref 0–5)

## 2023-11-17 LAB — URINE CULTURE

## 2023-12-01 ENCOUNTER — Other Ambulatory Visit: Payer: Self-pay

## 2023-12-01 ENCOUNTER — Emergency Department (HOSPITAL_COMMUNITY)

## 2023-12-01 ENCOUNTER — Emergency Department (HOSPITAL_COMMUNITY): Admission: EM | Admit: 2023-12-01 | Discharge: 2023-12-01 | Disposition: A | Discharge status: Home / Self Care

## 2023-12-01 DIAGNOSIS — Z7982 Long term (current) use of aspirin: Secondary | ICD-10-CM | POA: Diagnosis not present

## 2023-12-01 DIAGNOSIS — N3091 Cystitis, unspecified with hematuria: Secondary | ICD-10-CM | POA: Diagnosis not present

## 2023-12-01 DIAGNOSIS — R319 Hematuria, unspecified: Secondary | ICD-10-CM | POA: Diagnosis present

## 2023-12-01 DIAGNOSIS — Z794 Long term (current) use of insulin: Secondary | ICD-10-CM | POA: Diagnosis not present

## 2023-12-01 DIAGNOSIS — N3090 Cystitis, unspecified without hematuria: Secondary | ICD-10-CM

## 2023-12-01 DIAGNOSIS — R310 Gross hematuria: Secondary | ICD-10-CM

## 2023-12-01 LAB — BASIC METABOLIC PANEL WITH GFR
Anion gap: 12 (ref 5–15)
BUN: 17 mg/dL (ref 8–23)
CO2: 20 mmol/L — ABNORMAL LOW (ref 22–32)
Calcium: 9 mg/dL (ref 8.9–10.3)
Chloride: 103 mmol/L (ref 98–111)
Creatinine, Ser: 0.95 mg/dL (ref 0.61–1.24)
GFR, Estimated: >60 mL/min (ref >60)
Glucose, Bld: 123 mg/dL — ABNORMAL HIGH (ref 70–99)
Potassium: 3.9 mmol/L (ref 3.5–5.1)
Sodium: 135 mmol/L (ref 135–145)

## 2023-12-01 LAB — CBC
HCT: 41 % (ref 39.0–52.0)
Hemoglobin: 13.7 g/dL (ref 13.0–17.0)
MCH: 32.2 pg (ref 26.0–34.0)
MCHC: 33.4 g/dL (ref 30.0–36.0)
MCV: 96.5 fL (ref 80.0–100.0)
Platelets: 164 K/uL (ref 150–400)
RBC: 4.25 MIL/uL (ref 4.22–5.81)
RDW: 13.3 % (ref 11.5–15.5)
WBC: 9.3 K/uL (ref 4.0–10.5)
nRBC: 0 % (ref 0.0–0.2)

## 2023-12-01 LAB — URINALYSIS, ROUTINE W REFLEX MICROSCOPIC: RBC / HPF: >50 RBC/hpf (ref 0–5)

## 2023-12-01 MED ORDER — SULFAMETHOXAZOLE-TRIMETHOPRIM 800-160 MG PO TABS
1.0000 | ORAL_TABLET | Freq: Two times a day (BID) | ORAL | 0 refills | Status: AC
  Filled 2023-12-01 (×2): qty 14, 7d supply, fill #0

## 2023-12-01 NOTE — ED Triage Notes (Signed)
 Pt bibems from home. Pt self caths, complains of hematuria that started 11/30/23. Denies any abdominal pain, nausea, or vomiting.   BP 100/70 P 96 O2 96% RA CBG 156

## 2023-12-01 NOTE — ED Provider Notes (Signed)
 [place] EMERGENCY DEPARTMENT AT [facility] Provider Note   CSN: [id number] Arrival date & time: 12/01/23  9286     Patient presents with: Hematuria   Shane Bond is a 63 y.o. male.   Patient is a 63 year old male presenting emergency department for hematuria.  Reports blood in urine x 2 days.  Seemingly worse in the morning but somewhat improves over the course of the day.  Notes some burning with urination, although he does have to self cath secondary to prostate issues.  Denies fevers or chills no nausea no vomiting.  He does note that he has had some left-sided flank pain for the past couple days.  No abdominal pain.   Hematuria       Prior to Admission medications   Medication Sig Start Date End Date Taking? Authorizing Provider  sulfamethoxazole -trimethoprim  (BACTRIM  DS) 800-160 MG tablet Take 1 tablet by mouth 2 (two) times daily for 7 days. 12/01/23 12/08/23 Yes Neysa Caron PARAS, DO  Accu-Chek Softclix Lancets lancets Use as instructed to check blood glucose level by fingerstick twice per day. 09/22/23   Fleming, Zelda W, NP  albuterol  (VENTOLIN  HFA) 108 (90 Base) MCG/ACT inhaler Inhale 2 puffs into the lungs every 4 (four) hours as needed for wheezing or shortness of breath. 07/29/23   Vonna Sharlet POUR, MD  Aspirin  81 MG CAPS Take 81 mg by mouth daily.    [provider]  benzonatate  (TESSALON ) 100 MG capsule Take 1 capsule (100 mg total) by mouth 3 (three) times daily as needed for cough. Patient not taking: Reported on 11/15/2023 07/29/23   Vonna Sharlet POUR, MD  Blood Glucose Monitoring Suppl (ACCU-CHEK GUIDE) w/Device KIT Use as instructed to check blood glucose level by fingerstick twice per day 04/26/21   Fleming, Zelda W, NP  dapagliflozin  propanediol (FARXIGA ) 10 MG TABS tablet Take 1 tablet (10 mg total) by mouth daily before breakfast. 08/15/23   Fleming, Zelda W, NP  ezetimibe  (ZETIA ) 10 MG tablet Take 1 tablet (10 mg total) by mouth daily. For  cholesterol. Add to omega 3 11/15/23   Fleming, Zelda W, NP  glucose blood (ACCU-CHEK GUIDE TEST) test strip Use to check blood sugar by fingerstick 2 (two) times daily. 05/10/23   Fleming, Zelda W, NP  insulin  glargine (LANTUS ) 100 UNIT/ML Solostar Pen Inject 40 Units into the skin 2 (two) times daily. 10/30/23   Fleming, Zelda W, NP  Insulin  Pen Needle (TECHLITE PLUS PEN NEEDLES) 32G X 4 MM MISC Inject as directed 2 (two) times daily. 06/19/23   Newlin, Enobong, MD  losartan  (COZAAR ) 25 MG tablet Take 1 tablet (25 mg total) by mouth daily. 08/15/23   Theotis Haze ORN, NP  Misc. Devices (CAREX COCCYX CUSHION) MISC Use daily when sitting for pressure relief ICD10 L98.429 07/02/20   Theotis Haze ORN, NP  Misc. Devices MISC Please provide patient with standard sized 48F urinary catheters for self cathing along with lubricant. 05/29/18   Fleming, Zelda W, NP  multivitamin (ONE-A-DAY MEN'S) TABS tablet Take 1 tablet by mouth daily. Patient not taking: Reported on 11/15/2023 05/10/23   Fleming, Zelda W, NP  omega-3 acid ethyl esters (LOVAZA ) 1 g capsule Take 2 capsules (2 g total) by mouth 2 (two) times daily. Please fill as a 90 day supply 08/15/23   Fleming, Zelda W, NP  Semaglutide , 1 MG/DOSE, 4 MG/3ML SOPN Inject 1 mg as directed once a week. 11/15/23   Fleming, Zelda W, NP  gemfibrozil  (LOPID ) 600  MG tablet Take 1 tablet (600 mg total) by mouth 2 (two) times daily before a meal. 07/20/19 11/04/19  Theotis Haze ORN, NP    Allergies: Lipitor [atorvastatin ] and Lisinopril     Review of Systems  Genitourinary:  Positive for hematuria.    Updated Vital Signs BP 123/78 (BP Location: Right Arm)   Pulse 87   Temp 98.7 F (37.1 C)   Resp 16   SpO2 97%   Physical Exam Vitals and nursing note reviewed.  Constitutional:      General: He is not in acute distress.    Appearance: He is not toxic-appearing.  HENT:     Head: Normocephalic.     Nose: Nose normal.     Mouth/Throat:     Mouth: Mucous membranes are  moist.  Eyes:     Conjunctiva/sclera: Conjunctivae normal.  Cardiovascular:     Rate and Rhythm: Normal rate and regular rhythm.  Pulmonary:     Effort: Pulmonary effort is normal.     Breath sounds: Normal breath sounds.  Abdominal:     General: Abdomen is flat. There is no distension.     Palpations: Abdomen is soft.     Tenderness: There is no abdominal tenderness. There is no guarding or rebound.  Musculoskeletal:        General: Normal range of motion.     Right lower leg: No edema.     Left lower leg: No edema.  Skin:    General: Skin is dry.     Capillary Refill: Capillary refill takes less than 2 seconds.  Neurological:     General: No focal deficit present.     Mental Status: He is alert and oriented to person, place, and time.  Psychiatric:        Mood and Affect: Mood normal.        Behavior: Behavior normal.     (all labs ordered are listed, but only abnormal results are displayed) Labs Reviewed  URINALYSIS, ROUTINE W REFLEX MICROSCOPIC - Abnormal; Notable for the following components:      Result Value   Color, Urine RED (*)    APPearance TURBID (*)    Glucose, UA   (*)    Value: TEST NOT REPORTED DUE TO COLOR INTERFERENCE OF URINE PIGMENT   Hgb urine dipstick   (*)    Value: TEST NOT REPORTED DUE TO COLOR INTERFERENCE OF URINE PIGMENT   Bilirubin Urine   (*)    Value: TEST NOT REPORTED DUE TO COLOR INTERFERENCE OF URINE PIGMENT   Ketones, ur   (*)    Value: TEST NOT REPORTED DUE TO COLOR INTERFERENCE OF URINE PIGMENT   Protein, ur   (*)    Value: TEST NOT REPORTED DUE TO COLOR INTERFERENCE OF URINE PIGMENT   Nitrite   (*)    Value: TEST NOT REPORTED DUE TO COLOR INTERFERENCE OF URINE PIGMENT   Leukocytes,Ua   (*)    Value: TEST NOT REPORTED DUE TO COLOR INTERFERENCE OF URINE PIGMENT   Bacteria, UA RARE (*)    All other components within normal limits  BASIC METABOLIC PANEL WITH GFR - Abnormal; Notable for the following components:   CO2 20 (*)     Glucose, Bld 123 (*)    All other components within normal limits  CBC    EKG: None  Radiology: CT Renal Stone Study Result Date: 12/01/2023 CLINICAL DATA:  Flank pain. EXAM: CT ABDOMEN AND PELVIS WITHOUT CONTRAST TECHNIQUE: Multidetector CT imaging  of the abdomen and pelvis was performed following the standard protocol without IV contrast. RADIATION DOSE REDUCTION: This exam was performed according to the departmental dose-optimization program which includes automated exposure control, adjustment of the mA and/or kV according to patient size and/or use of iterative reconstruction technique. COMPARISON:  June 13, 2023. FINDINGS: Lower chest: No acute abnormality. Hepatobiliary: No focal liver abnormality is seen. No gallstones, gallbladder wall thickening, or biliary dilatation. Pancreas: Unremarkable. No pancreatic ductal dilatation or surrounding inflammatory changes. Spleen: Normal in size without focal abnormality. Adrenals/Urinary Tract: Adrenal glands appear normal. Stable right renal cyst. Stable minimal bilateral nephrolithiasis. No hydronephrosis or renal obstruction is noted on the right. 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 There is now noted high density material within the dilated lower pole collecting system suggesting probable hemorrhage. Mild perinephric stranding is again noted. Stomach/Bowel: The stomach is unremarkable. There is no evidence of bowel obstruction. Large periumbilical hernia is noted which contains a portion of small bowel, but does not result in obstruction. Moderate amount of stool seen throughout the colon. The colon is very redundant, with the cecum seen in the right upper quadrant. The appendix is not clearly visualized. No definite inflammation is noted. Vascular/Lymphatic: Aortic atherosclerosis. No enlarged abdominal or pelvic  lymph nodes. Reproductive: Prostate is unremarkable. Other: No ascites is noted. Musculoskeletal: No acute or significant osseous findings. IMPRESSION: 1. 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 There is now noted high density material within the dilated lower pole collecting system suggesting probable hemorrhage. Neoplasm cannot be excluded. 2. Stable minimal bilateral nephrolithiasis. 3. Large periumbilical hernia is noted which contains a portion of small bowel, but does not result in obstruction. 4. Aortic atherosclerosis. Aortic Atherosclerosis (ICD10-I70.0). Electronically Signed   By: Lynwood Landy Raddle M.D.   On: 12/01/2023 08:49     Procedures   Medications Ordered in the ED - No data to display  Clinical Course as of 12/01/23 1338  Fri Dec 01, 2023  9157 Per [facility]: Prostate cancer history. Neuropathic bladder. Intermittent catheterization up to 10 times a day.   Also had in january:Cystoscopy bilateral retrograde pyelograms left ureteroscopy Moses laser stone manipulation left stent replacement fluoroscopy today. Plan to pretreat with Rocephin . Patient understands risk of pain infection bleeding sepsis possible need for further surgical or medical intervention.  [TY]  [id number] CT Renal Stone Study IMPRESSION: 1. 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 There is now noted high density material within the dilated lower pole collecting system suggesting probable hemorrhage. Neoplasm cannot be excluded. 2. Stable minimal bilateral nephrolithiasis. 3. Large periumbilical hernia is noted which contains a portion of small bowel, but does not result in obstruction. 4. Aortic atherosclerosis.  Aortic Atherosclerosis  (ICD10-I70.0).   Electronically Signed   By: Lynwood Landy Raddle M.D.   On: 12/01/2023 08:49   [TY]  1032 Case discussed with Cam with urology, recommending  indwelling Foley catheter for urethral rest as hematuria could be secondary to trauma.  Also would recommend antibiotics if having symptoms of UTI. [TY]  1200 Nursing informed of need for Foley catheter. [TY]  1335 18 French Foley placed.  Will discharge in stable condition. [TY]    Clinical Course  User Index [TY] Neysa Caron PARAS, DO                                 Medical Decision Making This is a 63 year old male presenting emergency department for hematuria.  He does self cath.  Vital signs reassuring, well-appearing on exam.  Infection versus trauma versus stone?  Will give basic screening labs, check UA.  Will get CT renal to evaluate for stone.  He does have a urologist, notes it is in [employer] however.  See course for further MDM disposition  Amount and/or Complexity of Data Reviewed External Data Reviewed:     Details: Not on blood thinner Labs: ordered. Decision-making details documented in ED Course. Radiology: ordered and independent interpretation performed. Decision-making details documented in ED Course.  Risk Prescription drug management. Decision regarding hospitalization. Diagnosis or treatment significantly limited by social determinants of health. Risk Details: Difficultly getting to outpatient appointments.      Final diagnoses:  Gross hematuria  Cystitis    ED Discharge Orders          Ordered    sulfamethoxazole -trimethoprim  (BACTRIM  DS) 800-160 MG tablet  2 times daily        12/01/23 1337               Neysa Caron PARAS, [place] 12/01/23 1338

## 2023-12-01 NOTE — Discharge Instructions (Signed)
 Please follow-up with your urologist.  Leave your Foley catheter in place until you are able to be seen by them.  We are prescribing you antibiotic.  Take it as prescribed a full course even if symptoms improve.  Return if you develop abdominal pain, Foley stops draining urine or you develop any new or worsening symptoms that are concerning to you.

## 2023-12-01 NOTE — ED Notes (Signed)
 Unsuccessful foley attempt. Charge notified. Pt states that he has a had time self cathing due to prostate.

## 2023-12-01 NOTE — ED Notes (Signed)
 Pt transported to CT ?

## 2023-12-04 ENCOUNTER — Other Ambulatory Visit: Payer: Self-pay

## 2023-12-14 ENCOUNTER — Other Ambulatory Visit (HOSPITAL_COMMUNITY): Payer: Self-pay

## 2024-01-11 ENCOUNTER — Other Ambulatory Visit: Payer: Self-pay

## 2024-02-08 ENCOUNTER — Other Ambulatory Visit (HOSPITAL_COMMUNITY): Payer: Self-pay

## 2024-02-08 ENCOUNTER — Other Ambulatory Visit: Payer: Self-pay

## 2024-03-06 ENCOUNTER — Other Ambulatory Visit (HOSPITAL_BASED_OUTPATIENT_CLINIC_OR_DEPARTMENT_OTHER): Payer: Self-pay

## 2024-03-06 ENCOUNTER — Other Ambulatory Visit: Payer: Self-pay

## 2024-03-06 ENCOUNTER — Other Ambulatory Visit (HOSPITAL_COMMUNITY): Payer: Self-pay

## 2024-03-27 ENCOUNTER — Other Ambulatory Visit (HOSPITAL_COMMUNITY): Payer: Self-pay

## 2024-03-27 ENCOUNTER — Telehealth (HOSPITAL_COMMUNITY): Payer: Self-pay

## 2024-03-27 ENCOUNTER — Other Ambulatory Visit: Payer: Self-pay

## 2024-03-29 ENCOUNTER — Other Ambulatory Visit (HOSPITAL_COMMUNITY): Payer: Self-pay
